# Patient Record
Sex: Female | Born: 1940 | Race: Black or African American | Hispanic: No | Smoking: Former smoker
Health system: Southern US, Community
[De-identification: ages and names within clinical notes are randomized; demographics above are authoritative.]

## PROBLEM LIST (undated history)

## (undated) DIAGNOSIS — D649 Anemia, unspecified: Secondary | ICD-10-CM

## (undated) DIAGNOSIS — K5790 Diverticulosis of intestine, part unspecified, without perforation or abscess without bleeding: Secondary | ICD-10-CM

## (undated) DIAGNOSIS — N200 Calculus of kidney: Secondary | ICD-10-CM

## (undated) DIAGNOSIS — Z9889 Other specified postprocedural states: Secondary | ICD-10-CM

## (undated) DIAGNOSIS — I10 Essential (primary) hypertension: Secondary | ICD-10-CM

## (undated) DIAGNOSIS — K219 Gastro-esophageal reflux disease without esophagitis: Secondary | ICD-10-CM

## (undated) DIAGNOSIS — E119 Type 2 diabetes mellitus without complications: Secondary | ICD-10-CM

## (undated) HISTORY — DX: Essential (primary) hypertension: I10

## (undated) HISTORY — DX: Calculus of kidney: N20.0

## (undated) HISTORY — DX: Type 2 diabetes mellitus without complications: E11.9

## (undated) HISTORY — DX: Gastro-esophageal reflux disease without esophagitis: K21.9

## (undated) HISTORY — DX: Anemia, unspecified: D64.9

## (undated) HISTORY — DX: Diverticulosis of intestine, part unspecified, without perforation or abscess without bleeding: K57.90

## (undated) HISTORY — DX: Other specified postprocedural states: Z98.890

## (undated) HISTORY — PX: ABDOMINAL HYSTERECTOMY: SHX81

## (undated) HISTORY — PX: EYE SURGERY: SHX253

## (undated) HISTORY — PX: OTHER SURGICAL HISTORY: SHX169

---

## 1999-07-11 ENCOUNTER — Encounter: Payer: Self-pay | Admitting: Family Medicine

## 1999-07-11 ENCOUNTER — Ambulatory Visit (HOSPITAL_COMMUNITY): Admission: RE | Admit: 1999-07-11 | Discharge: 1999-07-11 | Payer: Self-pay | Admitting: Family Medicine

## 1999-07-21 ENCOUNTER — Encounter: Payer: Self-pay | Admitting: Urology

## 1999-07-21 ENCOUNTER — Encounter: Admission: RE | Admit: 1999-07-21 | Discharge: 1999-07-21 | Payer: Self-pay | Admitting: Urology

## 1999-08-19 ENCOUNTER — Encounter: Admission: RE | Admit: 1999-08-19 | Discharge: 1999-08-19 | Payer: Self-pay | Admitting: Family Medicine

## 1999-08-19 ENCOUNTER — Encounter: Payer: Self-pay | Admitting: Family Medicine

## 2000-07-14 ENCOUNTER — Encounter: Payer: Self-pay | Admitting: Family Medicine

## 2000-07-14 ENCOUNTER — Ambulatory Visit (HOSPITAL_COMMUNITY): Admission: RE | Admit: 2000-07-14 | Discharge: 2000-07-14 | Payer: Self-pay | Admitting: Family Medicine

## 2002-04-11 ENCOUNTER — Encounter: Admission: RE | Admit: 2002-04-11 | Discharge: 2002-04-11 | Payer: Self-pay | Admitting: Family Medicine

## 2002-04-11 ENCOUNTER — Encounter: Payer: Self-pay | Admitting: Family Medicine

## 2003-01-27 DIAGNOSIS — E119 Type 2 diabetes mellitus without complications: Secondary | ICD-10-CM

## 2003-01-27 HISTORY — DX: Type 2 diabetes mellitus without complications: E11.9

## 2003-04-24 ENCOUNTER — Inpatient Hospital Stay (HOSPITAL_COMMUNITY): Admission: EM | Admit: 2003-04-24 | Discharge: 2003-04-27 | Payer: Self-pay | Admitting: Emergency Medicine

## 2004-02-28 ENCOUNTER — Ambulatory Visit: Payer: Self-pay | Admitting: Family Medicine

## 2004-04-17 LAB — HM MAMMOGRAPHY

## 2004-07-25 ENCOUNTER — Ambulatory Visit: Payer: Self-pay | Admitting: Family Medicine

## 2004-08-01 ENCOUNTER — Ambulatory Visit: Payer: Self-pay | Admitting: Family Medicine

## 2004-09-11 ENCOUNTER — Ambulatory Visit: Payer: Self-pay | Admitting: Family Medicine

## 2004-12-11 ENCOUNTER — Ambulatory Visit: Payer: Self-pay | Admitting: Family Medicine

## 2005-07-21 ENCOUNTER — Ambulatory Visit: Payer: Self-pay | Admitting: Family Medicine

## 2005-07-28 ENCOUNTER — Ambulatory Visit: Payer: Self-pay | Admitting: Family Medicine

## 2005-08-06 ENCOUNTER — Ambulatory Visit: Payer: Self-pay | Admitting: Family Medicine

## 2005-08-28 ENCOUNTER — Ambulatory Visit: Payer: Self-pay | Admitting: Family Medicine

## 2006-06-18 ENCOUNTER — Ambulatory Visit: Payer: Self-pay | Admitting: Family Medicine

## 2006-06-18 LAB — CONVERTED CEMR LAB
ALT: 16 units/L (ref 0–40)
AST: 19 units/L (ref 0–37)
Albumin: 4 g/dL (ref 3.5–5.2)
Alkaline Phosphatase: 77 units/L (ref 39–117)
BUN: 10 mg/dL (ref 6–23)
Basophils Absolute: 0 10*3/uL (ref 0.0–0.1)
Basophils Relative: 0.3 % (ref 0.0–1.0)
Bilirubin, Direct: 0.1 mg/dL (ref 0.0–0.3)
CO2: 30 meq/L (ref 19–32)
Calcium: 9.7 mg/dL (ref 8.4–10.5)
Chloride: 107 meq/L (ref 96–112)
Cholesterol: 139 mg/dL (ref 0–200)
Creatinine, Ser: 0.6 mg/dL (ref 0.4–1.2)
Creatinine,U: 92.3 mg/dL
Eosinophils Absolute: 0.1 10*3/uL (ref 0.0–0.6)
Eosinophils Relative: 1.8 % (ref 0.0–5.0)
GFR calc Af Amer: 129 mL/min
GFR calc non Af Amer: 107 mL/min
Glucose, Bld: 121 mg/dL — ABNORMAL HIGH (ref 70–99)
HCT: 38.5 % (ref 36.0–46.0)
HDL: 39.9 mg/dL (ref >39.0)
Hemoglobin: 13.2 g/dL (ref 12.0–15.0)
Hgb A1c MFr Bld: 6.7 % — ABNORMAL HIGH (ref 4.6–6.0)
LDL Cholesterol: 75 mg/dL (ref 0–99)
Lymphocytes Relative: 26.9 % (ref 12.0–46.0)
MCHC: 34.2 g/dL (ref 30.0–36.0)
MCV: 80.8 fL (ref 78.0–100.0)
Microalb Creat Ratio: 9.8 mg/g (ref 0.0–30.0)
Microalb, Ur: 0.9 mg/dL (ref 0.0–1.9)
Monocytes Absolute: 0.3 10*3/uL (ref 0.2–0.7)
Monocytes Relative: 5.1 % (ref 3.0–11.0)
Neutro Abs: 4.4 10*3/uL (ref 1.4–7.7)
Neutrophils Relative %: 65.9 % (ref 43.0–77.0)
Platelets: 261 10*3/uL (ref 150–400)
Potassium: 4.4 meq/L (ref 3.5–5.1)
RBC: 4.76 M/uL (ref 3.87–5.11)
RDW: 13.7 % (ref 11.5–14.6)
Sodium: 143 meq/L (ref 135–145)
TSH: 1.24 microintl units/mL (ref 0.35–5.50)
Total Bilirubin: 0.7 mg/dL (ref 0.3–1.2)
Total CHOL/HDL Ratio: 3.5
Total Protein: 7.8 g/dL (ref 6.0–8.3)
Triglycerides: 122 mg/dL (ref 0–149)
VLDL: 24 mg/dL (ref 0–40)
WBC: 6.6 10*3/uL (ref 4.5–10.5)

## 2006-07-27 ENCOUNTER — Ambulatory Visit: Payer: Self-pay | Admitting: Family Medicine

## 2006-08-09 ENCOUNTER — Encounter: Payer: Self-pay | Admitting: Family Medicine

## 2006-08-09 DIAGNOSIS — K219 Gastro-esophageal reflux disease without esophagitis: Secondary | ICD-10-CM | POA: Insufficient documentation

## 2006-08-09 DIAGNOSIS — I10 Essential (primary) hypertension: Secondary | ICD-10-CM | POA: Insufficient documentation

## 2006-09-09 ENCOUNTER — Ambulatory Visit: Payer: Self-pay | Admitting: Family Medicine

## 2007-03-11 ENCOUNTER — Telehealth: Payer: Self-pay | Admitting: Family Medicine

## 2007-03-14 ENCOUNTER — Ambulatory Visit: Payer: Self-pay | Admitting: Family Medicine

## 2007-03-14 LAB — CONVERTED CEMR LAB
ALT: 16 units/L (ref 0–35)
AST: 19 units/L (ref 0–37)
Albumin: 3.8 g/dL (ref 3.5–5.2)
Alkaline Phosphatase: 79 units/L (ref 39–117)
BUN: 9 mg/dL (ref 6–23)
Basophils Absolute: 0 10*3/uL (ref 0.0–0.1)
Basophils Relative: 0.1 % (ref 0.0–1.0)
Bilirubin Urine: NEGATIVE
Bilirubin, Direct: 0.1 mg/dL (ref 0.0–0.3)
Blood in Urine, dipstick: NEGATIVE
CO2: 29 meq/L (ref 19–32)
Calcium: 9.9 mg/dL (ref 8.4–10.5)
Chloride: 105 meq/L (ref 96–112)
Cholesterol: 120 mg/dL (ref 0–200)
Creatinine, Ser: 0.6 mg/dL (ref 0.4–1.2)
Creatinine,U: 126.2 mg/dL
Eosinophils Absolute: 0.2 10*3/uL (ref 0.0–0.6)
Eosinophils Relative: 2.5 % (ref 0.0–5.0)
GFR calc Af Amer: 129 mL/min
GFR calc non Af Amer: 107 mL/min
Glucose, Bld: 142 mg/dL — ABNORMAL HIGH (ref 70–99)
Glucose, Urine, Semiquant: NEGATIVE
HCT: 40.2 % (ref 36.0–46.0)
HDL: 31.7 mg/dL — ABNORMAL LOW (ref >39.0)
Hemoglobin: 13 g/dL (ref 12.0–15.0)
Hgb A1c MFr Bld: 7.1 % — ABNORMAL HIGH (ref 4.6–6.0)
Ketones, urine, test strip: NEGATIVE
LDL Cholesterol: 69 mg/dL (ref 0–99)
Lymphocytes Relative: 25 % (ref 12.0–46.0)
MCHC: 32.3 g/dL (ref 30.0–36.0)
MCV: 82.8 fL (ref 78.0–100.0)
Microalb Creat Ratio: 11.9 mg/g (ref 0.0–30.0)
Microalb, Ur: 1.5 mg/dL (ref 0.0–1.9)
Monocytes Absolute: 0.3 10*3/uL (ref 0.2–0.7)
Monocytes Relative: 4.6 % (ref 3.0–11.0)
Neutro Abs: 4.5 10*3/uL (ref 1.4–7.7)
Neutrophils Relative %: 67.8 % (ref 43.0–77.0)
Nitrite: NEGATIVE
Platelets: 232 10*3/uL (ref 150–400)
Potassium: 4.6 meq/L (ref 3.5–5.1)
Protein, U semiquant: NEGATIVE
RBC: 4.86 M/uL (ref 3.87–5.11)
RDW: 13.4 % (ref 11.5–14.6)
Rapid Strep: NEGATIVE
Sodium: 141 meq/L (ref 135–145)
Specific Gravity, Urine: 1.02
TSH: 0.95 microintl units/mL (ref 0.35–5.50)
Total Bilirubin: 0.6 mg/dL (ref 0.3–1.2)
Total CHOL/HDL Ratio: 3.8
Total Protein: 7.2 g/dL (ref 6.0–8.3)
Triglycerides: 99 mg/dL (ref 0–149)
Urobilinogen, UA: 0.2
VLDL: 20 mg/dL (ref 0–40)
WBC Urine, dipstick: NEGATIVE
WBC: 6.7 10*3/uL (ref 4.5–10.5)
pH: 7

## 2007-06-16 ENCOUNTER — Encounter: Payer: Self-pay | Admitting: Family Medicine

## 2007-06-16 ENCOUNTER — Ambulatory Visit: Payer: Self-pay | Admitting: Family Medicine

## 2007-06-16 ENCOUNTER — Other Ambulatory Visit: Admission: RE | Admit: 2007-06-16 | Discharge: 2007-06-16 | Payer: Self-pay | Admitting: Family Medicine

## 2007-06-22 ENCOUNTER — Telehealth: Payer: Self-pay | Admitting: Family Medicine

## 2007-07-07 ENCOUNTER — Telehealth: Payer: Self-pay | Admitting: *Deleted

## 2007-09-06 ENCOUNTER — Telehealth: Payer: Self-pay | Admitting: Family Medicine

## 2007-09-23 ENCOUNTER — Ambulatory Visit: Payer: Self-pay | Admitting: Family Medicine

## 2007-09-27 LAB — CONVERTED CEMR LAB
BUN: 11 mg/dL (ref 6–23)
CO2: 28 meq/L (ref 19–32)
Calcium: 9.3 mg/dL (ref 8.4–10.5)
Chloride: 108 meq/L (ref 96–112)
Creatinine, Ser: 0.6 mg/dL (ref 0.4–1.2)
GFR calc Af Amer: 129 mL/min
GFR calc non Af Amer: 106 mL/min
Glucose, Bld: 146 mg/dL — ABNORMAL HIGH (ref 70–99)
Hgb A1c MFr Bld: 7.3 % — ABNORMAL HIGH (ref 4.6–6.0)
Potassium: 4.3 meq/L (ref 3.5–5.1)
Sodium: 142 meq/L (ref 135–145)

## 2007-12-26 ENCOUNTER — Ambulatory Visit: Payer: Self-pay | Admitting: Family Medicine

## 2008-02-14 ENCOUNTER — Ambulatory Visit: Payer: Self-pay | Admitting: Family Medicine

## 2008-02-15 ENCOUNTER — Telehealth (INDEPENDENT_AMBULATORY_CARE_PROVIDER_SITE_OTHER): Payer: Self-pay | Admitting: *Deleted

## 2008-02-17 ENCOUNTER — Ambulatory Visit: Payer: Self-pay | Admitting: Gastroenterology

## 2008-02-20 ENCOUNTER — Encounter: Payer: Self-pay | Admitting: Gastroenterology

## 2008-03-08 ENCOUNTER — Telehealth: Payer: Self-pay | Admitting: Gastroenterology

## 2008-03-13 ENCOUNTER — Ambulatory Visit: Payer: Self-pay | Admitting: Gastroenterology

## 2008-03-14 ENCOUNTER — Ambulatory Visit: Payer: Self-pay | Admitting: Gastroenterology

## 2008-03-14 ENCOUNTER — Encounter: Payer: Self-pay | Admitting: Gastroenterology

## 2008-03-14 LAB — HM COLONOSCOPY

## 2008-03-15 ENCOUNTER — Encounter: Payer: Self-pay | Admitting: Gastroenterology

## 2008-03-21 ENCOUNTER — Telehealth: Payer: Self-pay | Admitting: Family Medicine

## 2008-03-28 ENCOUNTER — Emergency Department (HOSPITAL_COMMUNITY): Admission: EM | Admit: 2008-03-28 | Discharge: 2008-03-28 | Payer: Self-pay | Admitting: Emergency Medicine

## 2008-03-28 ENCOUNTER — Ambulatory Visit: Payer: Self-pay | Admitting: Family Medicine

## 2008-03-29 ENCOUNTER — Encounter: Payer: Self-pay | Admitting: Family Medicine

## 2008-03-29 LAB — CONVERTED CEMR LAB
ALT: 23 units/L (ref 0–35)
Alkaline Phosphatase: 104 units/L (ref 39–117)
Basophils Absolute: 0 10*3/uL (ref 0.0–0.1)
Bilirubin, Direct: 0.1 mg/dL (ref 0.0–0.3)
CO2: 30 meq/L (ref 19–32)
GFR calc Af Amer: 107 mL/min
Glucose, Bld: 550 mg/dL (ref 70–99)
Lymphocytes Relative: 23.1 % (ref 12.0–46.0)
MCHC: 34 g/dL (ref 30.0–36.0)
Monocytes Relative: 3.6 % (ref 3.0–12.0)
Platelets: 252 10*3/uL (ref 150–400)
Potassium: 4.3 meq/L (ref 3.5–5.1)
RDW: 13.6 % (ref 11.5–14.6)
Sodium: 138 meq/L (ref 135–145)
TSH: 0.79 microintl units/mL (ref 0.35–5.50)
Total Bilirubin: 1 mg/dL (ref 0.3–1.2)
Total Protein: 7.9 g/dL (ref 6.0–8.3)

## 2008-03-30 ENCOUNTER — Telehealth: Payer: Self-pay | Admitting: Family Medicine

## 2008-04-12 ENCOUNTER — Ambulatory Visit: Payer: Self-pay | Admitting: Family Medicine

## 2008-05-01 ENCOUNTER — Telehealth: Payer: Self-pay | Admitting: Family Medicine

## 2008-10-09 ENCOUNTER — Encounter: Payer: Self-pay | Admitting: Family Medicine

## 2008-11-06 ENCOUNTER — Telehealth: Payer: Self-pay | Admitting: Family Medicine

## 2008-11-30 ENCOUNTER — Ambulatory Visit: Payer: Self-pay | Admitting: Family Medicine

## 2008-12-04 LAB — CONVERTED CEMR LAB
BUN: 13 mg/dL (ref 6–23)
Calcium: 9.3 mg/dL (ref 8.4–10.5)
Cholesterol: 119 mg/dL (ref 0–200)
Creatinine, Ser: 0.7 mg/dL (ref 0.4–1.2)
GFR calc non Af Amer: 107.11 mL/min (ref >60)
Glucose, Bld: 119 mg/dL — ABNORMAL HIGH (ref 70–99)
HDL: 38.6 mg/dL — ABNORMAL LOW (ref >39.00)
Hgb A1c MFr Bld: 6.7 % — ABNORMAL HIGH (ref 4.6–6.5)
LDL Cholesterol: 62 mg/dL (ref 0–99)
Microalb Creat Ratio: 7.5 mg/g (ref 0.0–30.0)
Potassium: 4.4 meq/L (ref 3.5–5.1)
VLDL: 18.6 mg/dL (ref 0.0–40.0)

## 2009-04-02 ENCOUNTER — Telehealth: Payer: Self-pay | Admitting: Family Medicine

## 2009-10-03 ENCOUNTER — Telehealth: Payer: Self-pay | Admitting: Family Medicine

## 2009-11-01 ENCOUNTER — Telehealth: Payer: Self-pay | Admitting: Family Medicine

## 2009-11-19 ENCOUNTER — Telehealth: Payer: Self-pay | Admitting: Family Medicine

## 2009-12-03 ENCOUNTER — Telehealth: Payer: Self-pay | Admitting: Family Medicine

## 2009-12-06 ENCOUNTER — Ambulatory Visit: Payer: Self-pay | Admitting: Family Medicine

## 2009-12-06 LAB — CONVERTED CEMR LAB
AST: 20 units/L (ref 0–37)
Alkaline Phosphatase: 87 units/L (ref 39–117)
Basophils Absolute: 0 10*3/uL (ref 0.0–0.1)
Basophils Relative: 0.2 % (ref 0.0–3.0)
Bilirubin, Direct: 0.1 mg/dL (ref 0.0–0.3)
Blood in Urine, dipstick: NEGATIVE
CO2: 30 meq/L (ref 19–32)
Calcium: 9.4 mg/dL (ref 8.4–10.5)
Creatinine, Ser: 0.5 mg/dL (ref 0.4–1.2)
Creatinine,U: 121.8 mg/dL
Eosinophils Absolute: 0.1 10*3/uL (ref 0.0–0.7)
GFR calc non Af Amer: 150.48 mL/min (ref >60)
HDL: 40.9 mg/dL (ref >39.00)
Hemoglobin: 12.4 g/dL (ref 12.0–15.0)
Ketones, urine, test strip: NEGATIVE
LDL Cholesterol: 65 mg/dL (ref 0–99)
Lymphocytes Relative: 23.4 % (ref 12.0–46.0)
MCHC: 33.4 g/dL (ref 30.0–36.0)
Microalb, Ur: 3.2 mg/dL — ABNORMAL HIGH (ref 0.0–1.9)
Monocytes Relative: 4.3 % (ref 3.0–12.0)
Neutrophils Relative %: 71.2 % (ref 43.0–77.0)
Nitrite: NEGATIVE
RBC: 4.42 M/uL (ref 3.87–5.11)
Sodium: 139 meq/L (ref 135–145)
Specific Gravity, Urine: 1.025
Total CHOL/HDL Ratio: 3
Total Protein: 7.2 g/dL (ref 6.0–8.3)
Triglycerides: 99 mg/dL (ref 0.0–149.0)
Urobilinogen, UA: 0.2
VLDL: 19.8 mg/dL (ref 0.0–40.0)
WBC Urine, dipstick: NEGATIVE

## 2009-12-13 ENCOUNTER — Ambulatory Visit: Payer: Self-pay | Admitting: Family Medicine

## 2009-12-13 ENCOUNTER — Encounter: Payer: Self-pay | Admitting: Family Medicine

## 2009-12-25 ENCOUNTER — Ambulatory Visit: Payer: Self-pay | Admitting: Family Medicine

## 2010-02-25 NOTE — Progress Notes (Signed)
Summary: appointment  Phone Note Call from Patient   Caller: Patient Call For: Roderick Pee MD Details for Reason: appointment Summary of Call: Ms. Piche would like to come in and see Dr Tawanna Cooler Thursday or Friday.   phone number [phone] or [phone]. Initial call taken by: Kern Reap CMA Duncan Dull),  November 19, 2009 1:32 PM  Follow-up for Phone Call        Contacted pt and appt was scheduled for cpx labwork on  11/11  and  a cpx with Dr Tawanna Cooler on  11/18.  Follow-up by: Debbra Riding,  November 19, 2009 1:57 PM

## 2010-02-25 NOTE — Progress Notes (Signed)
Summary: take Zyrtec with Diabetic meds?  Phone Note Call from Patient Call back at Work Phone [phone]   Caller: Patient Call For: Roderick Pee MD Summary of Call: Pt has nagging cough and wants to know if she can take Zyrtec with diabetic meds???  [facility])  Initial call taken by: Lynann Beaver CMA,  November 01, 2009 1:17 PM  Follow-up for Phone Call        yes Zyrtec is okay  Follow-up by: Nelwyn Salisbury MD,  November 01, 2009 2:28 PM  Additional Follow-up for Phone Call Additional follow up Details #1::        Pt notified. Additional Follow-up by: Lynann Beaver CMA,  November 01, 2009 2:30 PM

## 2010-02-25 NOTE — Progress Notes (Signed)
Summary: refill request  Phone Note Refill Request Message from:  Fax from Pharmacy on December 03, 2009 11:01 AM  Refills Requested: Medication #1:  ZESTRIL 20 MG TABS 1 tablet by mouth two times a day  Medication #2:  GLUCOTROL 10 MG TABS take one tab two times a day Initial call taken by: Kern Reap CMA Duncan Dull),  December 03, 2009 11:01 AM    Prescriptions: GLUCOTROL 10 MG TABS (GLIPIZIDE) take one tab two times a day  #180 x 3   Entered by:   Kern Reap CMA (AAMA)   Authorized by:   Roderick Pee MD   Signed by:   Kern Reap CMA (AAMA) on 12/03/2009   Method used:   Electronically to        [facility] (retail)       [address]       [place], [place]  [address]       Ph: [phone]       Fax: [phone]   RxID:   [id number] ZESTRIL 20 MG TABS (LISINOPRIL) 1 tablet by mouth two times a day  #100 Each x 3   Entered by:   Kern Reap CMA (AAMA)   Authorized by:   Roderick Pee MD   Signed by:   Kern Reap CMA (AAMA) on 12/03/2009   Method used:   Electronically to        [facility] (retail)       [address]       [place], [place]  [address]       Ph: [phone]       Fax: [phone]   RxID:   [id number]

## 2010-02-25 NOTE — Assessment & Plan Note (Signed)
Summary: 2 wk rov//slm   Vital Signs:  Patient profile:   70 year old female Menstrual status:  hysterectomy Weight:      148 pounds Temp:     98.6 degrees F BP sitting:   130 / 88  (left arm) Cuff size:   regular  Vitals Entered By: Kern Reap CMA Duncan Dull) (December 25, 2009 11:33 AM) CC: follow-up visit   Primary Care Provider:  Kelle Darting, MD  CC:  follow-up visit.  History of Present Illness: Katrina Garrison is a 70 y/o fem with type ! DM who comes in today for reevaluation of diabetes, having increased her insulin to 15 units two weeks ago.  Her blood sugars are still elevated, although the general trend is coming down.  She is now getting numbers in the 130 to 150 range.  Asymptomatic except for an allergic reaction to peanut, chest and swelling of her eyes and her lips and some generalized pruritus.  No airway edema  Allergies: 1)  ! * Hydromet 2)  Sulfamethoxazole (Sulfamethoxazole)  Past History:  Past medical, surgical, family and social histories (including risk factors) reviewed for relevance to current acute and chronic problems.  Past Medical History: Reviewed history from 02/17/2008 and no changes required. Diabetes mellitus, type II GERD Hypertension lithotripsy x 3, for kidney stones childbirth x 2  Past Surgical History: Reviewed history from 02/17/2008 and no changes required. Hysterectomy CBX2  Family History: Reviewed history from 02/17/2008 and no changes required. Father died around age 43, alcoholism, and hypertension Mother died at age 94 throat cancer.  Smoker, diabetes, and hypertension Both daughters have Multiple Sclerosis.   Social History: Reviewed history from 06/16/2007 and no changes required. Occupation: Single Never Smoked Alcohol use-no Drug use-no Regular exercise-yes  Review of Systems      See HPI  Physical Exam  General:  Well-developed,well-nourished,in no acute distress; alert,appropriate and cooperative throughout  examination   Impression & Recommendations:  Problem # 1:  IDDM (ICD-250.01) Assessment Improved  Her updated medication list for this problem includes:    Glucophage 1000 Mg Tabs (Metformin hcl) .Marland Kitchen... Take 1 tablet by mouth two times a day    Aspirin 81 Mg Tbec (Aspirin) ..... Once daily    Glucotrol 10 Mg Tabs (Glipizide) .Marland Kitchen... Take one tab two times a day    Humalog Mix 75/25 75-25 % Susp (Insulin lispro prot & lispro) ..... Use 15 units at bedtime    Lisinopril 40 Mg Tabs (Lisinopril) .Marland Kitchen... Take 1 tablet by mouth every morning  Complete Medication List: 1)  Glucophage 1000 Mg Tabs (Metformin hcl) .... Take 1 tablet by mouth two times a day 2)  Aspirin 81 Mg Tbec (Aspirin) .... Once daily 3)  Vitamin B Complex Caps (B complex vitamins) .... Once daily 4)  Vitamin D3 10000 Unit Caps (Cholecalciferol) .... Once daily 5)  Motrin Ib 200 Mg Tabs (Ibuprofen) .... As needed 6)  Onetouch Ultra Test Strp (Glucose blood) .... Test daily as directed by your doctor 7)  Onetouch Ultrasoft Lancets Misc (Lancets) .... Test daily as directed by your doctor 8)  Glucotrol 10 Mg Tabs (Glipizide) .... Take one tab two times a day 9)  Monoject Insulin Syringe 29g X 1/2" 0.5 Ml Misc (Insulin syringe-needle u-100) .... Uad 10)  Humalog Mix 75/25 75-25 % Susp (Insulin lispro prot & lispro) .... Use 15 units at bedtime 11)  Lisinopril 40 Mg Tabs (Lisinopril) .... Take 1 tablet by mouth every morning 12)  Allegra Allergy 180 Mg  Tabs (Fexofenadine hcl) .... Once daily  Patient Instructions: 1)  increase the insulin to 20 units daily.  Return in two weeks for follow-up.  Check a fasting blood sugar daily only once daily in the morning. 2)  Take 10 mg of plain Claritin daily to help with reverse the allergic reaction   Orders Added: 1)  Est. Patient Level III [84696]

## 2010-02-25 NOTE — Assessment & Plan Note (Signed)
Summary: CPX // RS   Vital Signs:  Patient profile:   70 year old female Menstrual status:  hysterectomy Height:      61 inches Weight:      147 pounds BMI:     27.88 Temp:     98.8 degrees F oral BP sitting:   140 / 84  (left arm) Cuff size:   regular  Vitals Entered By: Kern Reap CMA Duncan Dull) (December 13, 2009 2:19 PM) CC: wellness exam     Menstrual Status hysterectomy   Primary Care Provider:  Kelle Darting, MD  CC:  wellness exam.  History of Present Illness: Katrina Garrison is a 70 year old female type I diabetic who comes in today for annual wellness exam.  She currently takes metformin 1000 mg b.i.d., Glucotrol, 10 mg b.i.d., Humalog mixed 75 -- 25 dose 10 units nightly.  Blood sugar fasting 196.  Hemoglobin A1c 9.4%.  We discussed serious options.  We will begin to increase her insulin until he can get her blood sugars are normal.  Also, recommend she see the diabetic teaching nurse.  She takes lisinopril 40 mg daily and 81-mg baby aspirin daily.  BP 140/84.  She gets routine eye care, dental care, BSE monthly, annual mammography, colonoscopy, normal, tetanus, 2005, seasonal flu 2011, Pneumovax 2005, information given on shingles Here for Medicare AWV:  1.   Risk factors based on Past M, S, F history:..reviewed no changes except for uncontrolled blood sugar 2.   Physical Activities:  walks daily and her work 3.   Depression/mood: good mood.  No depression 4.   Hearing: normal 5.   ADL's: functions independently works daily 6.   Fall Risk: reviewed.  None identified 7.   Home Safety: no guns in the house 8.   Height, weight, &visual acuity:height weight, normal vision, unchanged.  Eye exam shows no retinopathy 9.   Counseling: increase your insulin by 5 units weekly until the blood sugar dropped to normal 10.   Labs ordered based on risk factors: reviewed all the data 11.           Referral Coordination........none indicated 12.           Care Plan........Marland Kitchenreviewed all  medications  13.            Cognitive Assessment   Allergies: 1)  ! * Hydromet 2)  Sulfamethoxazole (Sulfamethoxazole)  Past History:  Past medical, surgical, family and social histories (including risk factors) reviewed, and no changes noted (except as noted below).  Past Medical History: Reviewed history from 02/17/2008 and no changes required. Diabetes mellitus, type II GERD Hypertension lithotripsy x 3, for kidney stones childbirth x 2  Past Surgical History: Reviewed history from 02/17/2008 and no changes required. Hysterectomy CBX2  Family History: Reviewed history from 02/17/2008 and no changes required. Father died around age 35, alcoholism, and hypertension Mother died at age 26 throat cancer.  Smoker, diabetes, and hypertension Both daughters have Multiple Sclerosis.   Social History: Reviewed history from 06/16/2007 and no changes required. Occupation: Single Never Smoked Alcohol use-no Drug use-no Regular exercise-yes  Review of Systems      See HPI  Physical Exam  General:  Well-developed,well-nourished,in no acute distress; alert,appropriate and cooperative throughout examination Head:  Normocephalic and atraumatic without obvious abnormalities. No apparent alopecia or balding. Eyes:  No corneal or conjunctival inflammation noted. EOMI. Perrla. Funduscopic exam benign, without hemorrhages, exudates or papilledema. Vision grossly normal. Ears:  External ear exam shows no significant lesions or deformities.  Otoscopic examination reveals clear canals, tympanic membranes are intact bilaterally without bulging, retraction, inflammation or discharge. Hearing is grossly normal bilaterally. Nose:  External nasal examination shows no deformity or inflammation. Nasal mucosa are pink and moist without lesions or exudates. Mouth:  Oral mucosa and oropharynx without lesions or exudates.  Teeth in good repair. Neck:  No deformities, masses, or tenderness  noted. Chest Wall:  No deformities, masses, or tenderness noted. Breasts:  No mass, nodules, thickening, tenderness, bulging, retraction, inflamation, nipple discharge or skin changes noted.   Lungs:  Normal respiratory effort, chest expands symmetrically. Lungs are clear to auscultation, no crackles or wheezes. Heart:  Normal rate and regular rhythm. S1 and S2 normal without gallop, murmur, click, rub or other extra sounds. Abdomen:  Bowel sounds positive,abdomen soft and non-tender without masses, organomegaly or hernias noted. Rectal:  No external abnormalities noted. Normal sphincter tone. No rectal masses or tenderness. Genitalia:  Pelvic Exam:        External: normal female genitalia without lesions or masses        Vagina: normal without lesions or masses        Cervix: normal without lesions or masses        Adnexa: normal bimanual exam without masses or fullness        Uterus: normal by palpation        Pap smear: not performed Msk:  No deformity or scoliosis noted of thoracic or lumbar spine.   Pulses:  R and L carotid,radial,femoral,dorsalis pedis and posterior tibial pulses are full and equal bilaterally Extremities:  No clubbing, cyanosis, edema, or deformity noted with normal full range of motion of all joints.   Neurologic:  No cranial nerve deficits noted. Station and gait are normal. Plantar reflexes are down-going bilaterally. DTRs are symmetrical throughout. Sensory, motor and coordinative functions appear intact. Skin:  Intact without suspicious lesions or rashes Cervical Nodes:  No lymphadenopathy noted Axillary Nodes:  No palpable lymphadenopathy Inguinal Nodes:  No significant adenopathy Psych:  Cognition and judgment appear intact. Alert and cooperative with normal attention span and concentration. No apparent delusions, illusions, hallucinations  Diabetes Management Exam:    Foot Exam (with socks and/or shoes not present):       Sensory-Pinprick/Light touch:           Left medial foot (L-4): normal          Left dorsal foot (L-5): normal          Left lateral foot (S-1): normal          Right medial foot (L-4): normal          Right dorsal foot (L-5): normal          Right lateral foot (S-1): normal       Sensory-Monofilament:          Left foot: normal          Right foot: normal       Inspection:          Left foot: normal          Right foot: normal       Nails:          Left foot: normal          Right foot: normal    Eye Exam:       Eye Exam done elsewhere          Date: 11/06/2008  Results: normal          Done by: opth   Impression & Recommendations:  Problem # 1:  IDDM (ICD-250.01) Assessment Deteriorated  The following medications were removed from the medication list:    Zestril 20 Mg Tabs (Lisinopril) .Marland Kitchen... 1 tablet by mouth two times a day Her updated medication list for this problem includes:    Glucophage 1000 Mg Tabs (Metformin hcl) .Marland Kitchen... Take 1 tablet by mouth two times a day    Aspirin 81 Mg Tbec (Aspirin) ..... Once daily    Glucotrol 10 Mg Tabs (Glipizide) .Marland Kitchen... Take one tab two times a day    Humalog Mix 75/25 75-25 % Susp (Insulin lispro prot & lispro) ..... Use 15 units at bedtime    Lisinopril 40 Mg Tabs (Lisinopril) .Marland Kitchen... Take 1 tablet by mouth every morning  Orders: Prescription Created Electronically [phone]) Medicare -1st Annual Wellness Visit [phone])  Problem # 2:  ACUTE LARYNGOTRACHEITIS W/O MENTION OBSTRUCTION (ICD-464.20) Assessment: Improved  Problem # 3:  Preventive Health Care (ICD-V70.0) Assessment: Unchanged  Complete Medication List: 1)  Glucophage 1000 Mg Tabs (Metformin hcl) .... Take 1 tablet by mouth two times a day 2)  Aspirin 81 Mg Tbec (Aspirin) .... Once daily 3)  Vitamin B Complex Caps (B complex vitamins) .... Once daily 4)  Vitamin D3 10000 Unit Caps (Cholecalciferol) .... Once daily 5)  Motrin Ib 200 Mg Tabs (Ibuprofen) .... As needed 6)  Onetouch Ultra Test Strp (Glucose  blood) .... Test daily as directed by your doctor 7)  Onetouch Ultrasoft Lancets Misc (Lancets) .... Test daily as directed by your doctor 8)  Glucotrol 10 Mg Tabs (Glipizide) .... Take one tab two times a day 9)  Monoject Insulin Syringe 29g X 1/2" 0.5 Ml Misc (Insulin syringe-needle u-100) .... Uad 10)  Humalog Mix 75/25 75-25 % Susp (Insulin lispro prot & lispro) .... Use 15 units at bedtime 11)  Lisinopril 40 Mg Tabs (Lisinopril) .... Take 1 tablet by mouth every morning  Other Orders: EKG w/ Interpretation (93000)  Patient Instructions: 1)  increase the insulin by 5 units weekly until your fasting blood sugar drops to 100...........Marland Kitchen 15 units starting tonight 2)  Please schedule a follow-up appointment in 2 weeks. 3)  It is important that you exercise regularly at least 20 minutes 5 times a week. If you develop chest pain, have severe difficulty breathing, or feel very tired , stop exercising immediately and seek medical attention. 4)  Schedule your mammogram. 5)  Schedule a colonoscopy/sigmoidoscopy to help detect colon cancer. 6)  Take calcium +Vitamin D daily. 7)  Take an Aspirin every day. 8)  Check your blood sugars regularly. If your readings are usually above : or below 70 you should contact our office. 9)  It is important that your Diabetic A1c level is checked every 3 months. 10)  See your eye doctor yearly to check for diabetic eye damage. 11)  Check your feet each night for sore areas, calluses or signs of infection. 12)  Check your Blood Pressure regularly. If it is above: you should make an appointment. Prescriptions: HUMALOG MIX 75/25 75-25 % SUSP (INSULIN LISPRO PROT & LISPRO) use 15 units at bedtime  #2 vials x 6   Entered and Authorized by:   Roderick Pee MD   Signed by:   Roderick Pee MD on 12/13/2009   Method used:   Electronically to        [facility] (retail)  [address]       [place], [place]  [address]       Ph:  [phone]       Fax: [phone]   RxID:   [id number] LISINOPRIL 40 MG TABS (LISINOPRIL) Take 1 tablet by mouth every morning  #100 x 3   Entered and Authorized by:   Roderick Pee MD   Signed by:   Roderick Pee MD on 12/13/2009   Method used:   Electronically to        [facility] (retail)       [address]       [place], [place]  [address]       Ph: [phone]       Fax: [phone]   RxID:   [id number] MONOJECT INSULIN SYRINGE 29G X 1/2" 0.5 ML MISC (INSULIN SYRINGE-NEEDLE U-100) UAD  #100 x 3   Entered and Authorized by:   Roderick Pee MD   Signed by:   Roderick Pee MD on 12/13/2009   Method used:   Electronically to        [facility] (retail)       [address]       [place], [place]  [address]       Ph: [phone]       Fax: [phone]   RxID:   [id number] GLUCOTROL 10 MG TABS (GLIPIZIDE) take one tab two times a day  #200 x 3   Entered and Authorized by:   Roderick Pee MD   Signed by:   Roderick Pee MD on 12/13/2009   Method used:   Electronically to        [facility] (retail)       [address]       [place], [place]  [address]       Ph: [phone]       Fax: [phone]   RxID:   [id number] Biagio Borg LANCETS  MISC (LANCETS) test daily as directed by your doctor  #100 x 11   Entered and Authorized by:   Roderick Pee MD   Signed by:   Roderick Pee MD on 12/13/2009   Method used:   Electronically to        [facility] (retail)       [address]       [place], [place]  [address]       Ph: [phone]       Fax: [phone]   RxID:   [id number] [place] TEST  STRP (GLUCOSE BLOOD) test daily as directed by your doctor  #100 Each x 10   Entered and Authorized by:   Roderick Pee MD   Signed by:   Roderick Pee MD on 12/13/2009   Method used:   Electronically to         [facility] (retail)       [address]       [place], [place]  [address]       Ph: [phone]       Fax: [phone]   RxID:  [id number] GLUCOPHAGE 1000 MG TABS (METFORMIN HCL) Take 1 tablet by mouth two times a day  #200 x 3   Entered and Authorized by:   Roderick Pee MD   Signed by:   Roderick Pee MD on 12/13/2009   Method used:   Electronically to        [facility] (retail)       [address]       [place], [place]  [address]       Ph: [phone]       Fax: [phone]   RxID:   [id number]    Orders Added: 1)  Prescription Created Electronically [phone] 2)  Medicare -1st Annual Wellness Visit [G0438] 3)  EKG w/ Interpretation [93000]

## 2010-02-25 NOTE — Progress Notes (Signed)
Summary: refill  Phone Note Refill Request Message from:  Fax from Pharmacy on October 03, 2009 4:59 PM  Refills Requested: Medication #1:  HUMALOG MIX 75/25 75-25 % SUSP use 10 units at bedtime Initial call taken by: Kern Reap CMA Duncan Dull),  October 03, 2009 4:59 PM    Prescriptions: HUMALOG MIX 75/25 75-25 % SUSP (INSULIN LISPRO PROT & LISPRO) use 10 units at bedtime  #3 x 0   Entered by:   Kern Reap CMA (AAMA)   Authorized by:   Roderick Pee MD   Signed by:   Kern Reap CMA (AAMA) on 10/03/2009   Method used:   Electronically to        [facility] (retail)       [address]       [place], [place]  [address]       Ph: [phone]       Fax: [phone]   RxID:   [id number]

## 2010-02-25 NOTE — Progress Notes (Signed)
Summary: REFILL  Phone Note Refill Request Message from:  Fax from Pharmacy  Refills Requested: Medication #1:  GLUCOTROL 10 MG TABS take one tab two times a day [facility] [address] EAV----[phone]  Initial call taken by: Warnell Forester,  April 02, 2009 9:23 AM    Prescriptions: GLUCOTROL 10 MG TABS (GLIPIZIDE) take one tab two times a day  #60 x 8   Entered by:   Kern Reap CMA (AAMA)   Authorized by:   Roderick Pee MD   Signed by:   Kern Reap CMA (AAMA) on 04/02/2009   Method used:   Electronically to        [facility] (retail)       [address]       [place], [place]  [address]       Ph: [phone]       Fax: [phone]   RxID:   [id number]

## 2010-05-08 LAB — BLOOD GAS, VENOUS
Bicarbonate: 15.7 mEq/L — ABNORMAL LOW (ref 20.0–24.0)
FIO2: 0.21 %
Patient temperature: 98.6
pH, Ven: 7.351 — ABNORMAL HIGH (ref 7.250–7.300)
pO2, Ven: 36 mmHg (ref 30.0–45.0)

## 2010-05-08 LAB — COMPREHENSIVE METABOLIC PANEL
ALT: 29 U/L (ref 0–35)
AST: 24 U/L (ref 0–37)
Albumin: 4 g/dL (ref 3.5–5.2)
Alkaline Phosphatase: 107 U/L (ref 39–117)
BUN: 16 mg/dL (ref 6–23)
CO2: 27 mEq/L (ref 19–32)
Calcium: 10 mg/dL (ref 8.4–10.5)
Chloride: 98 mEq/L (ref 96–112)
Creatinine, Ser: 0.8 mg/dL (ref 0.4–1.2)
GFR calc Af Amer: >60 mL/min (ref >60)
GFR calc non Af Amer: >60 mL/min (ref >60)
Glucose, Bld: 512 mg/dL (ref 70–99)
Potassium: 4 mEq/L (ref 3.5–5.1)
Sodium: 134 mEq/L — ABNORMAL LOW (ref 135–145)
Total Bilirubin: 0.8 mg/dL (ref 0.3–1.2)
Total Protein: 7.6 g/dL (ref 6.0–8.3)

## 2010-05-08 LAB — DIFFERENTIAL
Basophils Absolute: 0 10*3/uL (ref 0.0–0.1)
Basophils Relative: 0 % (ref 0–1)
Eosinophils Absolute: 0.1 10*3/uL (ref 0.0–0.7)
Eosinophils Relative: 1 % (ref 0–5)
Lymphocytes Relative: 25 % (ref 12–46)
Lymphs Abs: 2 10*3/uL (ref 0.7–4.0)
Monocytes Absolute: 0.3 10*3/uL (ref 0.1–1.0)
Monocytes Relative: 4 % (ref 3–12)
Neutro Abs: 5.6 10*3/uL (ref 1.7–7.7)
Neutrophils Relative %: 70 % (ref 43–77)

## 2010-05-08 LAB — GLUCOSE, CAPILLARY: Glucose-Capillary: 540 mg/dL (ref 70–99)

## 2010-05-08 LAB — URINE MICROSCOPIC-ADD ON: Urine-Other: NONE SEEN

## 2010-05-08 LAB — URINALYSIS, ROUTINE W REFLEX MICROSCOPIC
Bilirubin Urine: NEGATIVE
Glucose, UA: >1000 mg/dL — AB
Hgb urine dipstick: NEGATIVE
Leukocytes, UA: NEGATIVE
Nitrite: NEGATIVE
Protein, ur: NEGATIVE mg/dL
Specific Gravity, Urine: 1.031 — ABNORMAL HIGH (ref 1.005–1.030)
Urobilinogen, UA: 0.2 mg/dL (ref 0.0–1.0)
pH: 7 (ref 5.0–8.0)

## 2010-05-08 LAB — CBC
HCT: 40.8 % (ref 36.0–46.0)
Hemoglobin: 13.6 g/dL (ref 12.0–15.0)
MCHC: 33.5 g/dL (ref 30.0–36.0)
MCV: 81.1 fL (ref 78.0–100.0)
Platelets: 264 10*3/uL (ref 150–400)
RBC: 5.03 MIL/uL (ref 3.87–5.11)
RDW: 14.5 % (ref 11.5–15.5)
WBC: 8 10*3/uL (ref 4.0–10.5)

## 2010-05-08 LAB — KETONES, QUALITATIVE: Acetone, Bld: NEGATIVE

## 2010-05-13 LAB — GLUCOSE, CAPILLARY: Glucose-Capillary: 290 mg/dL — ABNORMAL HIGH (ref 70–99)

## 2010-06-04 ENCOUNTER — Other Ambulatory Visit: Payer: Self-pay | Admitting: Family Medicine

## 2010-06-13 NOTE — Discharge Summary (Signed)
NAMECLAUDENE, Katrina Garrison                       ACCOUNT NO.:  [id number]   MEDICAL RECORD NO.:  [id number]                   PATIENT TYPE:  INP   LOCATION:  5524                                 FACILITY:  [facility]   PHYSICIAN:  Katrina Garrison, M.D. [facility]          DATE OF BIRTH:  05-10-1940   DATE OF ADMISSION:  04/24/2003  DATE OF DISCHARGE:  04/27/2003                                 DISCHARGE SUMMARY   DISCHARGE DIAGNOSES:  1. New diagnosis of diabetes.  2. Uncontrolled hyperglycemia.  3. Early diabetic ketoacidosis.  4. Acute renal insufficiency.  5. Hypernatremia.   HISTORY OF PRESENT ILLNESS:  Katrina Garrison is a 70 year old, African-American  female who presented with eight-day history of cough and fever.  She had  been seen by her primary M.D. and diagnosed with pneumonia.  She had been  given Tussionex, albuterol MDI, prednisone and doxycycline for outpatient  treatment.  The patient was not tolerating the medications well.  She was  developing nausea, vomiting and abdominal pain.  The patient's family was  concerned about dehydration and brought her to the emergency department.  It  was there where she was found to have a glucose greater than 600.  The  patient denied prior history of diabetes.  The patient did described  polyuria and polydipsia prior to admission.   PAST MEDICAL HISTORY:  Diverticular disease.   HOSPITAL COURSE:  Problem 1.  ADULT-ONSET DIABETES MELLITUS (NEW DIAGNOSIS):  The patient presented with uncontrolled diabetes.  Her hemoglobin A1C was  elevated at 12%.  The patient was started on the Glucommander insulin drip.  Once her blood sugars had improved, she was given a dose of Lantus and then  started on Glucophage.  The patient has now also been started on Amaryl  which improved with blood sugar control.  The patient's blood sugars are  ranging from the high 100s to the high 200s currently.  The patient has  begun diabetic education.  She has been  instructed to check her blood  sugars.  She does have a follow-up appointment scheduled at the outpatient  nutrition and diabetes management center.  We have also arranged closed  followup with her primary care physician for further titration of her  medications.   Problem 2.  EARLY DIABETIC KETOACIDOSIS:  The patient presented with mild  acidosis secondary to the hyperglycemia.  This did correct with the  normalization of her blood sugars and with fluids.   Problem 3.  ACUTE RENAL INSUFFICIENCY SECONDARY TO THE HYPEROSMOLAR STATE:  This did correct with fluids.   Problem 4.  HYPERNATREMIA SECONDARY TO DEHYDRATION FROM HER METABOLIC  ABNORMALITY:  This also corrected with fluids.   DISCHARGE LABORATORY DATA AND X-RAY FINDINGS:  Hemoglobin 13.4, hematocrit  40.  Sodium 145, potassium 3.7, creatinine 0.9.  Hemoglobin A1C was 12.1%.   DISCHARGE MEDICATIONS:  1. Glucophage 500 mg b.i.d.  2. Amaryl 2 mg b.i.d.   FOLLOW UP:  Follow up with Dr. Tawanna Garrison on Tuesday, April 5, at 11 a.m.      Katrina Garrison, P.A. [facility]                  Katrina Garrison, M.D. [facility]    LC/MEDQ  D:  04/27/2003  T:  04/27/2003  Job:  [id number]   cc:   Katrina Garrison, M.D. [facility] [place]

## 2010-08-12 ENCOUNTER — Telehealth: Payer: Self-pay | Admitting: Family Medicine

## 2010-08-12 NOTE — Telephone Encounter (Signed)
Pt called and has a question. Would not leave any details. Req call back from nurse.

## 2010-08-12 NOTE — Telephone Encounter (Signed)
Left message on machine returning patient's call 

## 2010-08-13 NOTE — Telephone Encounter (Signed)
Spoke with patient.

## 2010-08-20 ENCOUNTER — Other Ambulatory Visit: Payer: Self-pay | Admitting: *Deleted

## 2010-08-20 MED ORDER — LISINOPRIL 40 MG PO TABS: 40.0000 mg | ORAL_TABLET | Freq: Every day | ORAL | Status: DC

## 2010-12-04 ENCOUNTER — Other Ambulatory Visit (INDEPENDENT_AMBULATORY_CARE_PROVIDER_SITE_OTHER): Payer: Self-pay

## 2010-12-04 DIAGNOSIS — E119 Type 2 diabetes mellitus without complications: Secondary | ICD-10-CM

## 2010-12-04 LAB — BASIC METABOLIC PANEL
GFR: 104.75 mL/min (ref >60.00)
Glucose, Bld: 266 mg/dL — ABNORMAL HIGH (ref 70–99)
Potassium: 4.6 mEq/L (ref 3.5–5.1)
Sodium: 141 mEq/L (ref 135–145)

## 2010-12-04 LAB — HEMOGLOBIN A1C: Hgb A1c MFr Bld: 11.5 % — ABNORMAL HIGH (ref 4.6–6.5)

## 2010-12-11 ENCOUNTER — Other Ambulatory Visit: Payer: Self-pay

## 2010-12-15 ENCOUNTER — Encounter: Payer: Self-pay | Admitting: Family Medicine

## 2010-12-16 ENCOUNTER — Ambulatory Visit (INDEPENDENT_AMBULATORY_CARE_PROVIDER_SITE_OTHER): Payer: 59 | Admitting: Family Medicine

## 2010-12-16 ENCOUNTER — Encounter: Payer: Self-pay | Admitting: Family Medicine

## 2010-12-16 DIAGNOSIS — Z23 Encounter for immunization: Secondary | ICD-10-CM

## 2010-12-16 DIAGNOSIS — E109 Type 1 diabetes mellitus without complications: Secondary | ICD-10-CM

## 2010-12-16 NOTE — Progress Notes (Signed)
  Subjective:    Patient ID: Katrina Garrison, female    DOB: Dec 29, 1940, 70 y.o.   MRN: [id number]  HPI [name] is a delightful, 70 year old female, nonsmoker, who comes in today for follow-up of diabetes, type I.  She stopped her metformin 1000 mg b.i.d. Because of GI side effects.  She is currently taking glipizide 10 mg b.i.d. And insulin 7525 does 20 units daily.  Fasting blood sugar in the 260 range.  Hemoglobin A1c11 percent   Review of Systems General and metabolic review of systems otherwise negative    Objective:   Physical Exam  Well-developed thin, female, in no acute distress      Assessment & Plan:  Diabetes type 1, and not at goal.  Plan diet and exercise, increase insulin to 30 units daily continue glipizide 10 b.i.d. Fasting blood sugar daily.  Follow-up in 3 days

## 2010-12-16 NOTE — Progress Notes (Signed)
Addended by: Kern Reap B on: 12/16/2010 05:30 PM   Modules accepted: Orders

## 2010-12-16 NOTE — Patient Instructions (Signed)
Increase the insulin to 30 units daily at bedtime.  Continue the glipizide, 10 mg twice daily.  Check a fasting blood sugar daily in the morning.  Return to p.m., Friday to check your blood sugar readings and meter with Fleet Contras.  Return to see me in two weeks for follow-up.  In one week.  If your blood sugar is not normal.  Increased her insulin to 40 units daily

## 2010-12-17 ENCOUNTER — Ambulatory Visit: Payer: Self-pay | Admitting: Family Medicine

## 2011-02-23 ENCOUNTER — Telehealth: Payer: Self-pay | Admitting: *Deleted

## 2011-02-23 NOTE — Telephone Encounter (Addendum)
Pt would like Fleet Contras to call her.  Only needs to speak to [facility], please.

## 2011-02-26 NOTE — Telephone Encounter (Signed)
Spoke to [employer]

## 2011-03-12 LAB — HM DIABETES EYE EXAM: HM Diabetic Eye Exam: NORMAL

## 2011-03-20 ENCOUNTER — Encounter: Payer: Self-pay | Admitting: Family Medicine

## 2011-04-03 ENCOUNTER — Other Ambulatory Visit: Payer: Self-pay | Admitting: *Deleted

## 2011-04-03 MED ORDER — GLIPIZIDE 10 MG PO TABS: 10.0000 mg | ORAL_TABLET | Freq: Two times a day (BID) | ORAL | Status: DC

## 2011-04-03 MED ORDER — LISINOPRIL 40 MG PO TABS: 40.0000 mg | ORAL_TABLET | Freq: Every day | ORAL | Status: DC

## 2011-06-19 ENCOUNTER — Other Ambulatory Visit: Payer: Self-pay | Admitting: *Deleted

## 2011-06-19 MED ORDER — INSULIN LISPRO PROT & LISPRO (75-25 MIX) 100 UNIT/ML ~~LOC~~ SUSP: 20.0000 [IU] | Freq: Every day | SUBCUTANEOUS | Status: DC

## 2011-06-19 MED ORDER — "INSULIN SYRINGE 29G X 1/2"" 0.5 ML MISC": 1.0000 | Status: DC

## 2011-07-21 ENCOUNTER — Telehealth: Payer: Self-pay | Admitting: Family Medicine

## 2011-07-21 NOTE — Telephone Encounter (Signed)
CAN accidentally closed it.

## 2011-07-21 NOTE — Telephone Encounter (Signed)
Caller: Henriette/Patient; Phone Number: [phone]; Message from caller: Please have Fleet Contras call her as soon as she gets this message

## 2011-07-21 NOTE — Telephone Encounter (Signed)
Spoke with patient and an appointment made 

## 2011-07-29 ENCOUNTER — Ambulatory Visit (INDEPENDENT_AMBULATORY_CARE_PROVIDER_SITE_OTHER): Payer: 59 | Admitting: Family Medicine

## 2011-07-29 ENCOUNTER — Encounter: Payer: Self-pay | Admitting: Family Medicine

## 2011-07-29 VITALS — BP 170/80 | HR 80 | Temp 99.3°F | Wt 141.0 lb

## 2011-07-29 DIAGNOSIS — E119 Type 2 diabetes mellitus without complications: Secondary | ICD-10-CM

## 2011-07-29 DIAGNOSIS — I10 Essential (primary) hypertension: Secondary | ICD-10-CM

## 2011-07-29 MED ORDER — AMLODIPINE BESYLATE 5 MG PO TABS: 5.0000 mg | ORAL_TABLET | Freq: Every day | ORAL | Status: DC

## 2011-07-29 NOTE — Progress Notes (Signed)
  Subjective:    Patient ID: Katrina Garrison, female    DOB: [birth year], 71 y.o.   MRN: [id number]  HPI  Acute visit. Patient has history of type 2 diabetes and hypertension. Last A1c 11.5% back in November. Takes glipizide 10 mg twice daily and 75/25 insulin 30 units at night. Last week noted blood sugars up in the 200s to 319 range. Normally low 100s. Urine frequency and thirst. Should be on metformin 1000 mg twice a day but currently not taking because of diarrhea. In the past, has taken one half tablet with good tolerance.  Hypertension currently lisinopril 40 mg daily compliant with therapy. No headaches or dizziness. Blood pressures consistently over 150 systolic recently.  Past Medical History  Diagnosis Date  . Diabetes mellitus type II   . GERD (gastroesophageal reflux disease)   . Hypertension   . History of lithotripsy     x3 for kidney stones   Past Surgical History  Procedure Date  . Abdominal hysterectomy   . Childbirth x 2     reports that she quit smoking about 26 years ago. She does not have any smokeless tobacco history on file. Her alcohol and drug histories not on file. family history includes Alcohol abuse in her father; Cancer in her mother; Diabetes in her mother; Hypertension in her father and mother; and Multiple sclerosis in her sisters. Allergies  Allergen Reactions  . Hydrocodone-Homatropine     REACTION: hallucinations  . Sulfamethoxazole     REACTION: "comatose"      Review of Systems  Constitutional: Negative for fatigue.  Eyes: Negative for visual disturbance.  Respiratory: Negative for cough, chest tightness, shortness of breath and wheezing.   Cardiovascular: Negative for chest pain, palpitations and leg swelling.  Neurological: Negative for dizziness, seizures, syncope, weakness, light-headedness and headaches.       Objective:   Physical Exam  Constitutional: She is oriented to person, place, and time. She appears well-developed and  well-nourished.  Neck: Neck supple. No thyromegaly present.       No carotid bruits  Cardiovascular: Normal rate and regular rhythm.   Pulmonary/Chest: Effort normal and breath sounds normal. No respiratory distress. She has no wheezes. She has no rales.  Musculoskeletal: She exhibits no edema.  Neurological: She is alert and oriented to person, place, and time.          Assessment & Plan:  #1 type 2 diabetes. Poor control. Try adding back metformin 1000 mg one half tablet twice daily. Followup with primary physician in 2 weeks to reassess.  May need BID insulin soon. #2 hypertension. Poorly controlled, especially systolic. Add amlodipine 5 mg once daily. Continue lisinopril. Reassess blood pressure 2 weeks

## 2011-07-29 NOTE — Patient Instructions (Addendum)
Get back on metformin 1000 mg one half tablet twice daily

## 2011-08-11 ENCOUNTER — Ambulatory Visit: Payer: 59 | Admitting: Family Medicine

## 2011-08-13 ENCOUNTER — Other Ambulatory Visit: Payer: Self-pay | Admitting: *Deleted

## 2011-08-13 MED ORDER — METFORMIN HCL 1000 MG PO TABS: 1000.0000 mg | ORAL_TABLET | Freq: Two times a day (BID) | ORAL | Status: DC

## 2011-08-13 MED ORDER — INSULIN LISPRO PROT & LISPRO (75-25 MIX) 100 UNIT/ML ~~LOC~~ SUSP: 30.0000 [IU] | Freq: Every day | SUBCUTANEOUS | Status: DC

## 2011-09-30 ENCOUNTER — Telehealth: Payer: Self-pay | Admitting: Family Medicine

## 2011-09-30 MED ORDER — INSULIN LISPRO PROT & LISPRO (75-25 MIX) 100 UNIT/ML ~~LOC~~ SUSP: 30.0000 [IU] | Freq: Every day | SUBCUTANEOUS | Status: DC

## 2011-09-30 NOTE — Telephone Encounter (Signed)
Pt called and is req the Pen form for  Humalog 75/25. Pt req to pick up this wk. [facility] on [address]. Pt req that Fleet Contras call her.

## 2011-10-28 ENCOUNTER — Other Ambulatory Visit: Payer: Self-pay | Admitting: *Deleted

## 2011-10-28 MED ORDER — INSULIN LISPRO PROT & LISPRO (75-25 MIX) 100 UNIT/ML ~~LOC~~ SUSP: 30.0000 [IU] | Freq: Every day | SUBCUTANEOUS | Status: DC

## 2011-10-30 ENCOUNTER — Ambulatory Visit: Payer: 59

## 2011-11-05 ENCOUNTER — Ambulatory Visit: Payer: 59 | Admitting: Family Medicine

## 2011-11-11 ENCOUNTER — Ambulatory Visit (INDEPENDENT_AMBULATORY_CARE_PROVIDER_SITE_OTHER): Payer: 59

## 2011-11-11 DIAGNOSIS — Z23 Encounter for immunization: Secondary | ICD-10-CM

## 2012-02-25 ENCOUNTER — Telehealth: Payer: Self-pay | Admitting: Family Medicine

## 2012-02-25 NOTE — Telephone Encounter (Signed)
Pt would like you to call her at her office.

## 2012-02-26 NOTE — Telephone Encounter (Signed)
Spoke with patient.

## 2012-03-17 ENCOUNTER — Encounter: Payer: Self-pay | Admitting: Family Medicine

## 2012-03-17 ENCOUNTER — Ambulatory Visit (INDEPENDENT_AMBULATORY_CARE_PROVIDER_SITE_OTHER): Payer: 59 | Admitting: Family Medicine

## 2012-03-17 VITALS — BP 130/80 | Temp 98.7°F | Wt 138.0 lb

## 2012-03-17 DIAGNOSIS — I10 Essential (primary) hypertension: Secondary | ICD-10-CM

## 2012-03-17 DIAGNOSIS — J45909 Unspecified asthma, uncomplicated: Secondary | ICD-10-CM | POA: Insufficient documentation

## 2012-03-17 DIAGNOSIS — E109 Type 1 diabetes mellitus without complications: Secondary | ICD-10-CM

## 2012-03-17 LAB — LIPID PANEL
Cholesterol: 127 mg/dL (ref 0–200)
LDL Cholesterol: 71 mg/dL (ref 0–99)
Total CHOL/HDL Ratio: 3
VLDL: 17.2 mg/dL (ref 0.0–40.0)

## 2012-03-17 LAB — HEPATIC FUNCTION PANEL
Bilirubin, Direct: 0.1 mg/dL (ref 0.0–0.3)
Total Bilirubin: 0.9 mg/dL (ref 0.3–1.2)
Total Protein: 7.5 g/dL (ref 6.0–8.3)

## 2012-03-17 LAB — CBC WITH DIFFERENTIAL/PLATELET
Basophils Relative: 0.1 % (ref 0.0–3.0)
Eosinophils Absolute: 0.1 10*3/uL (ref 0.0–0.7)
HCT: 38.9 % (ref 36.0–46.0)
Hemoglobin: 12.8 g/dL (ref 12.0–15.0)
Lymphocytes Relative: 35.9 % (ref 12.0–46.0)
MCHC: 32.8 g/dL (ref 30.0–36.0)
Neutro Abs: 4.5 10*3/uL (ref 1.4–7.7)
RBC: 4.75 Mil/uL (ref 3.87–5.11)

## 2012-03-17 LAB — MICROALBUMIN / CREATININE URINE RATIO
Creatinine,U: 268.9 mg/dL
Microalb, Ur: 4.2 mg/dL — ABNORMAL HIGH (ref 0.0–1.9)

## 2012-03-17 LAB — BASIC METABOLIC PANEL
CO2: 26 mEq/L (ref 19–32)
Calcium: 9.2 mg/dL (ref 8.4–10.5)
Glucose, Bld: 206 mg/dL — ABNORMAL HIGH (ref 70–99)
Potassium: 3.6 mEq/L (ref 3.5–5.1)
Sodium: 138 mEq/L (ref 135–145)

## 2012-03-17 MED ORDER — HYDROCODONE-HOMATROPINE 5-1.5 MG/5ML PO SYRP: ORAL_SOLUTION | ORAL | Status: DC

## 2012-03-17 MED ORDER — PREDNISONE 20 MG PO TABS: ORAL_TABLET | ORAL | Status: DC

## 2012-03-17 MED ORDER — ALBUTEROL SULFATE HFA 108 (90 BASE) MCG/ACT IN AERS: 2.0000 | INHALATION_SPRAY | Freq: Four times a day (QID) | RESPIRATORY_TRACT | Status: DC | PRN

## 2012-03-17 NOTE — Progress Notes (Signed)
  Subjective:    Patient ID: Katrina Garrison, female    DOB: [birth year], 72 y.o.   MRN: [id number]  HPI Katrina Garrison is a 72 year old female nonsmoker who comes in today for evaluation of asthma  On February 9 she developed a cough 5 days ago she began wheezing. No fever earache sore throat etc. 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 Last flareup of asthma was about 10 years ago.  She's a type I diabetic she checks her blood sugar 3 times weekly blood sugars averaging about 1:30.   Review of Systems Review of systems otherwise negative    Objective:   Physical Exam  Well-developed well-nourished female no acute distress HEENT negative neck was supple no adenopathy lungs show symmetrical breath sounds inspiratory and expiratory moderate wheezing  Respiratory rate 12 and unlabored      Assessment & Plan:  Asthma plan begin prednisone monitor blood sugar carefully followup in 5 days

## 2012-03-17 NOTE — Patient Instructions (Addendum)
Drink lots of water  Vaporizer  Prednisone,,,,,,,,,,, 2 tabs daily  Hydromet,,,,,,, 1/2-1 teaspoon 3 times daily. For cough  Albuterol,,,,,,,,, 2 puffs 4 times daily  Return on Monday for followup  A rest at home and do not work

## 2012-03-21 ENCOUNTER — Ambulatory Visit (INDEPENDENT_AMBULATORY_CARE_PROVIDER_SITE_OTHER): Payer: 59 | Admitting: Family Medicine

## 2012-03-21 ENCOUNTER — Encounter: Payer: Self-pay | Admitting: Family Medicine

## 2012-03-21 VITALS — BP 140/90 | Temp 99.3°F

## 2012-03-21 DIAGNOSIS — E109 Type 1 diabetes mellitus without complications: Secondary | ICD-10-CM

## 2012-03-21 DIAGNOSIS — J45909 Unspecified asthma, uncomplicated: Secondary | ICD-10-CM

## 2012-03-21 NOTE — Patient Instructions (Addendum)
Beginning tomorrow take a half of prednisone for 4 days,,,,,,, skip Saturday and Sunday,,,,,,,, then take a half of prednisone Monday Wednesday Friday for a two-week taper  Increase your insulin to 50 units daily   Check a fasting blood sugar daily in the morning and return in 6 weeks for followup  Tighten up on your diabetic diet and walk 30 minutes daily

## 2012-03-21 NOTE — Progress Notes (Signed)
  Subjective:    Patient ID: Katrina Garrison, female    DOB: 1940/09/27, 72 y.o.   MRN: [id number]  HPI Katrina Garrison is a 72 year old female who comes in today for followup of asthma  We saw her last Thursday with acute asthma and started on prednisone 40 mg daily for 3 days and then begin to taper. She took one prednisone daily. Her blood sugars jumped up in the 240 range. She takes Humalog 75-25 doses 4 units daily. She states she feels a whole better wheezing is basically gone   Review of Systems    review of systems otherwise negative except for elevated blood sugar Objective:   Physical Exam Well-developed well-nourished female no acute distress lungs are clear no wheezing       Assessment & Plan:  Asthma resolving plan taper prednisone increase insulin followup in one week

## 2012-03-21 NOTE — Addendum Note (Signed)
Addended by: Kern Reap B on: 03/21/2012 02:56 PM   Modules accepted: Orders

## 2012-03-31 ENCOUNTER — Telehealth: Payer: Self-pay | Admitting: Family Medicine

## 2012-03-31 NOTE — Telephone Encounter (Signed)
Caller: Etana/Patient; Phone: [phone]; Reason for Call: She said she had missed a call from office not sure what it was about.  I said I saw they were working on a referral to endocrinology She said she did cancel her appt on Monday because she couldn't get off work but she will call them and reschedule.

## 2012-04-05 ENCOUNTER — Ambulatory Visit: Payer: 59 | Admitting: Internal Medicine

## 2012-04-11 ENCOUNTER — Other Ambulatory Visit: Payer: 59

## 2012-04-18 ENCOUNTER — Encounter: Payer: 59 | Admitting: Family Medicine

## 2012-04-25 ENCOUNTER — Telehealth: Payer: Self-pay | Admitting: Family Medicine

## 2012-04-25 NOTE — Telephone Encounter (Signed)
Caller: Nancyann/Patient; Phone: [phone]; Reason for Call: Fleet Contras, Dr.  Nelida Meuse nurse please call Chastidy at work.  [phone].  Needs to ask question in reference to a problem she is having.

## 2012-04-25 NOTE — Telephone Encounter (Signed)
Spoke with patient.  Endo appointment made.

## 2012-05-17 ENCOUNTER — Encounter: Payer: Self-pay | Admitting: Family Medicine

## 2012-05-24 ENCOUNTER — Other Ambulatory Visit: Payer: Self-pay | Admitting: Family Medicine

## 2012-06-06 ENCOUNTER — Encounter: Payer: Self-pay | Admitting: Internal Medicine

## 2012-06-06 ENCOUNTER — Ambulatory Visit (INDEPENDENT_AMBULATORY_CARE_PROVIDER_SITE_OTHER): Payer: 59 | Admitting: Internal Medicine

## 2012-06-06 VITALS — BP 134/72 | HR 96 | Temp 98.1°F | Resp 12 | Ht 62.0 in | Wt 141.0 lb

## 2012-06-06 DIAGNOSIS — E109 Type 1 diabetes mellitus without complications: Secondary | ICD-10-CM

## 2012-06-06 MED ORDER — INSULIN DETEMIR 100 UNIT/ML FLEXPEN: 40.0000 [IU] | PEN_INJECTOR | Freq: Every day | SUBCUTANEOUS | Status: DC

## 2012-06-06 MED ORDER — SITAGLIPTIN PHOSPHATE 100 MG PO TABS: 100.0000 mg | ORAL_TABLET | Freq: Every day | ORAL | Status: DC

## 2012-06-06 NOTE — Progress Notes (Signed)
Patient ID: Dawnette Mione, female   DOB: 05/18/1940, 72 y.o.   MRN: [id number]  HPI: Ziyan Hillmer is a 72 y.o.-year-old female, referred by her PCP, Dr.Todd, for management of DM2, insulin-dependent, uncontrolled, without complications, but with hospitalization for diabetic coma x 2 (last was 3 years ago).  Patient has been diagnosed with diabetes ~ 5 years ago; she started insulin 1-2 years ago. Last hemoglobin A1c was: Lab Results  Component Value Date   HGBA1C 12.9* 03/17/2012    Pt is on a regimen of: - Humalog 75/25 pen - 50 units daily once a day - forgets the insulin 1/7 days. She tells me that she is taking the insulin at bedtime! She is injecting the bedtime insulin in her abdomen. No problems with the injection sites. She sometimes developed shaking and sweating after taking the nighttime insulin. She also can inject approximately 5 units in the morning in her upper arm to helped her lower her sugars during the day.  She was also on Glipizide 10 bid, now off. She was on Metformin 1000 mg po bid, but not anymore 2/2 diarrhea which is incapacitating, and is impeding her work  Pt checks her sugars once a day and they are: - am: fluctuating from 96-250, with one value at 364 after forgetting her insulin the night before No lows. Lowest sugar was 96; she has hypoglycemia awareness at 60.   Pt's meals are: - Breakfast: grits  - Lunch: chicken - no skin or pork tenderloin, pasta, salad+ dressing - Dinner: (5:30 and 6) - goes to bed at 7-7:30 - dinner is largest meal: salads, pasta, potatoes/string bean/peas/squash - SNACKS: Peanut bars She exercises by walking 5 days a week.  Pt does not have chronic kidney disease, last BUN/creatinine was:  Lab Results  Component Value Date   BUN 9 03/17/2012   CREATININE 0.7 03/17/2012  ACR 1.6 3 mo ago.  Last set of lipids: Lab Results  Component Value Date   CHOL 127 03/17/2012   HDL 38.80* 03/17/2012   LDLCALC 71 03/17/2012   TRIG 86.0  03/17/2012   CHOLHDL 3 03/17/2012   Pt's last eye exam was last year, next eye exam 06/04 with Dr. Elmer Picker. No DR. Denies numbness and tingling in her legs - Dr. Angelia Mould podiatry.  I reviewed her chart and she also has a history of asthma, and is on frequent prednisone tapers, last around 02/2012; also HTN;  h/o nephrolithiais, s/p lithotripsy; GERD.  Pt has FH of DM in mother.  ROS: Constitutional: + increased appetite, no weight gain/loss, no fatigue, + hot flashes Eyes: no blurry vision, no xerophthalmia ENT: +sore throat, no nodules palpated in throat, no dysphagia/odynophagia, + hoarseness, + tinnitus Cardiovascular: no CP/SOB/palpitations/leg swelling Respiratory: + cough/no SOB/ + wheezing Gastrointestinal: no N/V/D/C Musculoskeletal: no muscle/joint aches Skin: no rashes, + itching Neurological: no tremors/numbness/tingling/dizziness, + headaches Psychiatric: no depression/anxiety Low libido  Past Medical History  Diagnosis Date  . Diabetes mellitus type II   . GERD (gastroesophageal reflux disease)   . Hypertension   . History of lithotripsy     x3 for kidney stones    Past Surgical History  Procedure Laterality Date  . Abdominal hysterectomy    . Childbirth x 2     History   Social History  . Marital Status: Divorced    Spouse Name: N/A    Number of Children: 2   Occupational History  . [employer] impression specialist [employer]   Social History Main Topics  .  Smoking status: Former Smoker -- 1.5 years    Quit date: 01/28/1985  . Smokeless tobacco: No  . Alcohol Use: Wine once a month, one drink  . Drug Use: No   Current Outpatient Prescriptions on File Prior to Visit  Medication Sig Dispense Refill  . albuterol (PROVENTIL HFA;VENTOLIN HFA) 108 (90 BASE) MCG/ACT inhaler Inhale 2 puffs into the lungs every 6 (six) hours as needed for wheezing.  1 Inhaler  1  . amLODipine (NORVASC) 5 MG tablet Take 1 tablet (5 mg total) by mouth daily.  30 tablet  5   . b complex vitamins tablet Take 1 tablet by mouth daily.        . Cholecalciferol (VITAMIN D3) 10000 UNITS capsule Take 10,000 Units by mouth daily.        . fexofenadine (ALLEGRA) 180 MG tablet Take 180 mg by mouth daily.        Marland Kitchen glucose blood (ONE TOUCH TEST STRIPS) test strip 1 each by Other route as needed. Use as instructed       . ibuprofen (ADVIL,MOTRIN) 200 MG tablet Take 200 mg by mouth every 6 (six) hours as needed.        . INSULIN SYRINGE .5CC/29G (MONOJECT INS SYR .5CC/29G) 29G X 1/2" 0.5 ML MISC 1 each by Does not apply route as directed.  100 each  3  . lisinopril (PRINIVIL,ZESTRIL) 20 MG tablet TAKE TWO TABLETS BY MOUTH EVERY DAY  180 tablet  3  . ONE TOUCH LANCETS MISC by Does not apply route daily.        Marland Kitchen aspirin 81 MG tablet Take 81 mg by mouth daily.         No current facility-administered medications on file prior to visit.   Allergies  Allergen Reactions  . Metformin And Related Diarrhea  . Sulfamethoxazole     REACTION: "comatose"   Family History  Problem Relation Age of Onset  . Cancer Mother     throat  . Diabetes Mother   . Hypertension Mother   . Alcohol abuse Father   . Hypertension Father   . Multiple sclerosis Sister   . Multiple sclerosis Sister    PE: BP 134/72  Pulse 96  Temp(Src) 98.1 F (36.7 C) (Oral)  Resp 12  Ht 5\' 2"  (1.575 m)  Wt 141 lb (63.957 kg)  BMI 25.78 kg/m2  SpO2 96% Wt Readings from Last 3 Encounters:  06/06/12 141 lb (63.957 kg)  03/17/12 138 lb (62.596 kg)  07/29/11 141 lb (63.957 kg)   Constitutional: normal weight, in NAD, looking younger than stated age Eyes: PERRLA, EOMI, no exophthalmos ENT: moist mucous membranes, no thyromegaly, no cervical lymphadenopathy Cardiovascular: RRR, No MRG Respiratory: CTA B Gastrointestinal: abdomen soft, NT, ND, BS+ Musculoskeletal: no deformities, strength intact in all 4 Skin: moist, warm, no rashes Neurological: no tremor with outstretched hands, DTR normal in all  4  ASSESSMENT: 1. DM2, insulin-dependent, uncontrolled, without complications  PLAN:  1. Patient with diabetes in the last approximately 5 years, on a regimen on an insulin 75/25, which however, she is taken at bedtime, putting her at risk for hypoglycemia due to the short-acting insulin. She does have fluctuating blood sugars in the morning, but the higher sugars in the 300s being when she forgets to take the insulin the night before. - pt appears to be very conscientious about her diabetes, she implemented healthy changes in her diet, and mentions that her sugars improved since her last hemoglobin A1c  was checked in 02/2012.  - We discussed about her regimen, and I suggested that she start long-acting insulin (we'll try Levemir since this is better covered by [place] [employer]). We'll start at Levemir 40 units every night. - patient cannot take metformin due to severe diarrhea, so I will take it off her list - she is not taking glipizide - To cover her mealtime sugars, will start Januvia 100 mg in a.m. I'm not sure whether she can be managed only on Januvia for mealtime coverage, but I would like to give it a try, especially since she is telling me that her sugars improved after her last hemoglobin A1c checked. For now, I do not have sugars later in the day, to get a better idea about her mealtime CBG excursions. - given sugar log and advised how to fill it and to bring it at next appt - I advised her to check at least twice a day - given foot care handout and explained the principles - given instructions for hypoglycemia management "15-15 rule" - I will see the patient back in a month with her sugar log - I advised her to keep her 75/25 insulin-dependent, as he might need them if she would require rapid-acting insulin, although this is not a preferred regimen since the doses are not very flexible

## 2012-06-06 NOTE — Patient Instructions (Addendum)
Please stop the Humalog 75/25 insulin. Start Levemir 40 units at bedtime. Start Januvia 100 mg daily before breakfast. Stop Metformin. Please return in 1 month with your sugar log.   PATIENT INSTRUCTIONS FOR TYPE 2 DIABETES:  **Please join MyChart!** - see attached instructions about how to join   DIET AND EXERCISE Diet and exercise is an important part of diabetic treatment.  We recommended aerobic exercise in the form of brisk walking (working between 40-60% of maximal aerobic capacity, similar to brisk walking) for 150 minutes per week (such as 30 minutes five days per week) along with 3 times per week performing 'resistance' training (using various gauge rubber tubes with handles) 5-10 exercises involving the major muscle groups (upper body, lower body and core) performing 10-15 repetitions (or near fatigue) each exercise. Start at half the above goal but build slowly to reach the above goals. If limited by weight, joint pain, or disability, we recommend daily walking in a swimming pool with water up to waist to reduce pressure from joints while allow for adequate exercise.    BLOOD GLUCOSES Monitoring your blood glucoses is important for continued management of your diabetes. Please check your blood glucoses 2-4 times a day: fasting, before meals and at bedtime (you can rotate these measurements - e.g. one day check before the 3 meals, the next day check before 2 of the meals and before bedtime, etc.   HYPOGLYCEMIA (low blood sugar) Hypoglycemia is usually a reaction to not eating, exercising, or taking too much insulin/ other diabetes drugs.  Symptoms include tremors, sweating, hunger, confusion, headache, etc. Treat IMMEDIATELY with 15 grams of Carbs:   4 glucose tablets    cup regular juice/soda   2 tablespoons raisins   4 teaspoons sugar   1 tablespoon honey Recheck blood glucose in 15 mins and repeat above if still symptomatic/blood glucose <100. Please contact our office at  [phone] if you have questions about how to next handle your insulin.  RECOMMENDATIONS TO REDUCE YOUR RISK OF DIABETIC COMPLICATIONS: * Take your prescribed MEDICATION(S). * Follow a DIABETIC diet: Complex carbs, fiber rich foods, heart healthy fish twice weekly, (monounsaturated and polyunsaturated) fats * AVOID saturated/trans fats, high fat foods, >2,300 mg salt per day. * EXERCISE at least 5 times a week for 30 minutes or preferably daily.  * DO NOT SMOKE OR DRINK more than 1 drink a day. * Check your FEET every day. Do not wear tightfitting shoes. Contact us if you develop an ulcer * See your EYE doctor once a year or more if needed * Get a FLU shot once a year * Get a PNEUMONIA vaccine once before and once after age 63 years  GOALS:  * Your Hemoglobin A1c of <7%  * Your Systolic BP should be 140 or lower  * Your Diastolic BP should be 80 or lower  * Your HDL (Good Cholesterol) should be 40 or higher  * Your LDL (Bad Cholesterol) should be 100 or lower  * Your Triglycerides should be 150 or lower  * Your Urine microalbumin (kidney function) should be <30 * Your Body Mass Index should be 25 or lower   We will be glad to help you achieve these goals. Our telephone number is: [phone].

## 2012-06-14 ENCOUNTER — Encounter: Payer: 59 | Admitting: Family Medicine

## 2012-07-07 ENCOUNTER — Ambulatory Visit (INDEPENDENT_AMBULATORY_CARE_PROVIDER_SITE_OTHER): Payer: 59 | Admitting: Internal Medicine

## 2012-07-07 ENCOUNTER — Encounter: Payer: Self-pay | Admitting: Internal Medicine

## 2012-07-07 VITALS — BP 126/78 | HR 105 | Temp 98.9°F | Resp 10 | Ht 62.0 in | Wt 142.0 lb

## 2012-07-07 DIAGNOSIS — E109 Type 1 diabetes mellitus without complications: Secondary | ICD-10-CM

## 2012-07-07 MED ORDER — PEN NEEDLES 31G X 6 MM MISC: Status: DC

## 2012-07-07 NOTE — Patient Instructions (Addendum)
Please sop eating desert at night before bed. Please return in 1 month with your sugar log.

## 2012-07-07 NOTE — Progress Notes (Signed)
Patient ID: Katrina Garrison, female   DOB: 06-05-1940, 72 y.o.   MRN: [id number]  HPI: Katrina Garrison is a 72 y.o.-year-old female-year-old female, returning for f/u for DM2, dx 2009, insulin-dependent, uncontrolled, without complications, but with hospitalization for diabetic coma x 2 (last was 3 years ago). Last visit 71 mo ago.   Patient started insulin 71-2 years ago. Last hemoglobin A1c was: Lab Results  Component Value Date   HGBA1C 12.9* 03/17/2012   Patient is on the following regimen: - Levemir 40 units each bedtime, started last visit - Januvia 100 mg daily, started at last visit Pt was on a regimen of Humalog 75/25 pen - 50 units daily once a day - but was forgetting the insulin 1/7 days. She was taking the insulin at bedtime >> lows at night and highs, mostly in the 200s but also some in the 300s in am. She was also on Glipizide 10 bid in the past. She was on Metformin 1000 mg po bid, but not anymore 2/2 diarrhea which is incapacitating, and is impeding her work.  Pt checks her sugars once a day and they are: - am: fluctuating from 96-250 >> now 150-230 - she does not check sugars later usually, however, when she checks, she is in the upper 100s or even 200 -  No sugars checked at bedtime No lows; she has hypoglycemia awareness at 60.  We reviewed her log together, and she documents sporadically, also keeping a food diary. She tells me that every night before going to bed she has a pound cake slice and a icecream!  She exercises by walking 5 days a week.  Pt does not have chronic kidney disease. ACR 1.6 3 mo ago. Last LDL level checked 4 months ago was 71.  Pt's last eye exam was last week, with Dr. Elmer Picker. I received the report of this today. No DR, no macular edema. Denies numbness and tingling in her legs - Dr. Angelia Mould podiatry.  She also has a history of asthma, and is on frequent prednisone tapers, last around 02/2012; also HTN;  h/o nephrolithiais, s/p lithotripsy;  GERD.  ROS: Constitutional: + increased appetite, no weight gain/loss, no fatigue, + hot flashes Eyes: no blurry vision, no xerophthalmia ENT:  No sore throat, no nodules palpated in throat, no dysphagia/odynophagia Cardiovascular: no CP/SOB/palpitations/leg swelling Respiratory: no cough/no SOB/ wheezing Gastrointestinal: no N/V/D/C Musculoskeletal: no muscle/joint aches Skin: no rashes, + itching Neurological: no tremors/numbness/tingling/dizziness, + headaches Psychiatric: no depression/anxiety Low libido  PE: BP 126/78  Pulse 105  Temp(Src) 98.9 F (37.2 C) (Oral)  Resp 10  Ht 5\' 2"  (1.575 m)  Wt 142 lb (64.411 kg)  BMI 25.97 kg/m2  SpO2 95% Wt Readings from Last 3 Encounters:  07/07/12 142 lb (64.411 kg)  06/06/12 141 lb (63.957 kg)  03/17/12 138 lb (62.596 kg)   Constitutional: normal weight, in NAD, looking younger than stated age Eyes: PERRLA, EOMI, no exophthalmos ENT: moist mucous membranes, no thyromegaly, no cervical lymphadenopathy Cardiovascular: RRR, No MRG Respiratory: CTA B Gastrointestinal: abdomen soft, NT, ND, BS+ Musculoskeletal: no deformities, strength intact in all 4 Skin: moist, warm, no rashes Neurological: no tremor with outstretched hands, DTR normal in all 4  ASSESSMENT: 1. DM2, insulin-dependent, uncontrolled, without complications  PLAN:  1. Patient with diabetes in the last approximately 5 years, slightly improved recently after starting basal insulin with 40 units of Levemir qhs and adding Januvia 100 mg in the morning. - pt appears to be very conscientious about her diabetes,  she implemented healthy changes in her diet, and mentions that her sugars improved since her last hemoglobin A1c was checked in 02/2012.  - we discussed about her diet, and reviewed her log, and explained that it is not clear whether her high sugars in the mornings are caused by not enough Levemir, or lack of coverage of her dinner, especially since we do not have  sugars at bedtime. I suspect is the latter. She tells me that she actually has a slice of lemon pound cake and ice cream every night before going to bed. She knows that this might cause her sugars to increase in a.m., and decided to not do this from now on. She would like to stop this before changing anything in her regimen, and I agree. - I explained again how to fill out her sugar log and showed her an example -  continue Levemir 40 units every night. - patient cannot take metformin due to severe diarrhea, so would not retry it - we'll continue Januvia 100 mg in a.m. I'm not sure whether she can be managed only on Januvia for mealtime coverage; we need more sugars later in the day to appreciate whether Januvia is enough  - we'll check her hemoglobin A1c at next visit - I will see the patient back in a month with her sugar log

## 2012-08-04 ENCOUNTER — Ambulatory Visit: Payer: 59 | Admitting: Internal Medicine

## 2012-08-12 ENCOUNTER — Telehealth: Payer: Self-pay | Admitting: *Deleted

## 2012-08-12 DIAGNOSIS — E109 Type 1 diabetes mellitus without complications: Secondary | ICD-10-CM

## 2012-08-12 MED ORDER — SITAGLIPTIN PHOSPHATE 100 MG PO TABS: 100.0000 mg | ORAL_TABLET | Freq: Every day | ORAL | Status: DC

## 2012-08-12 NOTE — Telephone Encounter (Signed)
Rx refill

## 2012-08-31 ENCOUNTER — Other Ambulatory Visit: Payer: Self-pay

## 2012-09-29 ENCOUNTER — Telehealth: Payer: Self-pay | Admitting: Family Medicine

## 2012-09-29 ENCOUNTER — Other Ambulatory Visit: Payer: Self-pay | Admitting: Family Medicine

## 2012-09-29 DIAGNOSIS — E109 Type 1 diabetes mellitus without complications: Secondary | ICD-10-CM

## 2012-09-29 NOTE — Telephone Encounter (Signed)
Spoke with patient and lab appointment made. 

## 2012-09-29 NOTE — Telephone Encounter (Signed)
PT is requesting that you call her on her cell. She would disclose no further details. Please assist.

## 2012-10-14 ENCOUNTER — Other Ambulatory Visit (INDEPENDENT_AMBULATORY_CARE_PROVIDER_SITE_OTHER): Payer: 59

## 2012-10-14 DIAGNOSIS — E109 Type 1 diabetes mellitus without complications: Secondary | ICD-10-CM

## 2012-10-14 LAB — BASIC METABOLIC PANEL
BUN: 11 mg/dL (ref 6–23)
CO2: 28 mEq/L (ref 19–32)
Chloride: 107 mEq/L (ref 96–112)
Creatinine, Ser: 0.6 mg/dL (ref 0.4–1.2)
Glucose, Bld: 184 mg/dL — ABNORMAL HIGH (ref 70–99)

## 2012-10-20 ENCOUNTER — Encounter: Payer: Self-pay | Admitting: Internal Medicine

## 2012-10-20 ENCOUNTER — Ambulatory Visit (INDEPENDENT_AMBULATORY_CARE_PROVIDER_SITE_OTHER): Payer: 59 | Admitting: Internal Medicine

## 2012-10-20 VITALS — BP 138/78 | Temp 98.8°F | Resp 10 | Wt 133.6 lb

## 2012-10-20 DIAGNOSIS — E109 Type 1 diabetes mellitus without complications: Secondary | ICD-10-CM

## 2012-10-20 NOTE — Progress Notes (Addendum)
Patient ID: Katrina Garrison, female   DOB: April 18, 1940, 72 y.o.   MRN: [id number]  HPI: Katrina Garrison is a 72 y.o.-year-old female, returning for f/u for DM2, dx 2009, insulin-dependent since 2012-13, uncontrolled, without chronic complications, with hospitalization for diabetic coma x 2 (last was 3 years ago). Last visit 3 mo ago.   Last hemoglobin A1c was: Lab Results  Component Value Date   HGBA1C 12.6* 10/14/2012   Reviewed latest A1C values: Component     Latest Ref Rng 12/04/2010 03/17/2012 10/14/2012  Hemoglobin A1C     4.6 - 6.5 % 11.5 (H) 12.9 (H) 12.6 (H)   Patient is on the following regimen: - Levemir 40 units hs - Januvia 100 mg daily  Pt was on a regimen of Humalog 75/25 pen - 50 units daily once a day - but was forgetting the insulin 1/7 days. She was taking the insulin at bedtime >> lows at night and highs, mostly in the 200s but also some in the 300s in am. She is doing better on Levemir.  She was also on Glipizide 10 bid in the past. She was on Metformin 1000 mg po bid, but stopped 2/2 diarrhea which is incapacitating, and is impeding her work.  Pt checks her sugars 1-2x a day and they are: - am: fluctuating from 96-250 >> 150-230 >> 207-307 - 2 hours after breakfast: 356 - before lunch: upper 100s-up to 200 >> 204 - before dinner: upper 100s-up to 200 >> 234-341 - Bedtime >> not checking >> 223-404 No lows; she has hypoglycemia awareness at 60.  For his high sugars, she tried metformin one day, and the next morning sugar was 166.  Meals: - b'fast: egg, bacon, cinnamon toast, green tea no sugar, coffee - lunch: salad with fruit and chinese noodles, fresh salad dressing - dinner: chicken, pork + grilled vegetables + desert: homemade chocolate - snacks: pretzels, grapes, apple, orange  Water and green tea  - Pt does not have chronic kidney disease. Last BUN/Cr: Lab Results  Component Value Date   BUN 11 10/14/2012   CREATININE 0.6 10/14/2012  ACR 1.6 3 mo  ago. She is on Lisinopril.  - she does not have HL. Last Lipid panel: Lab Results  Component Value Date   CHOL 127 03/17/2012   HDL 38.80* 03/17/2012   LDLCALC 71 03/17/2012   TRIG 86.0 03/17/2012   CHOLHDL 3 03/17/2012  Sh is not on a statin. - Pt's last eye exam was in 06/2012, with Dr. Elmer Picker. I received the report of this. No DR, no macular edema.  - Denies numbness and tingling in her legs - Dr. Angelia Mould podiatry.  She also has a history of asthma, and is on frequent prednisone tapers, last around 02/2012; also HTN;  h/o nephrolithiasis, s/p lithotripsy; GERD.  ROS: Constitutional: + increased appetite, + weight oss, no fatigue, + hot flashes; increased urination, increased thirst Eyes: no blurry vision, no xerophthalmia ENT:  No sore throat, no nodules palpated in throat, no dysphagia/odynophagia Cardiovascular: no CP/SOB/palpitations/leg swelling Respiratory: no cough/no SOB/ wheezing Gastrointestinal: no N/V/D/C Musculoskeletal: no muscle/joint aches Skin: no rashes Neurological: no tremors/numbness/tingling/dizziness  I reviewed pt's medications, allergies, PMH, social hx, family hx and no changes required, except as mentioned above.  PE: BP 138/78  Temp(Src) 98.8 F (37.1 C) (Oral)  Resp 10  Wt 133 lb 9.6 oz (60.601 kg)  BMI 24.43 kg/m2 Wt Readings from Last 3 Encounters:  10/20/12 133 lb 9.6 oz (60.601 kg)  07/07/12 142 lb (  64.411 kg)  06/06/12 141 lb (63.957 kg)   Constitutional: normal weight, in NAD, looking younger than stated age Eyes: PERRLA, EOMI, no exophthalmos ENT: moist mucous membranes, no thyromegaly, no cervical lymphadenopathy Cardiovascular: RRR, No MRG Respiratory: CTA B Gastrointestinal: abdomen soft, NT, ND, BS+ Musculoskeletal: no deformities, strength intact in all 4 Skin: moist, warm, no rashes Neurological: no tremor with outstretched hands, DTR normal in all 4  ASSESSMENT: 1. DM2, insulin-dependent, uncontrolled, without  complications  PLAN:  1. Patient with diabetes in the last approximately 5 years, slightly improved after starting basal insulin with 40 units of Levemir qhs, but with deterioration since last visit. She cannot pinpoint to what could have caused her high sugars. She is compliant with her Lantus, however she did not take Januvia in the last week, as her daughter was sick in the hospital. I am worried about the fact that her hemoglobin A1c returned very high, at 12.6, she has polyuria and polydipsia, and has lost 9 pounds since last visit. 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 - We discussed about mealtime insulin and correct administration, 15 minutes before every meal. We will start with 6 units of Humalog for every meal, and decreased to 4 units for a small meal. This will most likely not be enough, but I would have the patient return in 3 weeks and would change the doses at that time. I also advised her to contact me with high or low sugars. - she also wants to retry metformin, which I agreed for, for now, at 1000 mg at night, to stop if she develops diarrhea. - I would check an anti-islet cell and anti-GAD antibodies - I will see the patient back in 3 weeks with her sugar log  Office Visit on 10/20/2012  Component Date Value Range Status  . Pancreatic Islet Cell Antibody 10/20/2012 <5  < 5 JDF Units Final   Comment:                             Laboratory Developed Test performed using a reagent labeled by                           the manufacturer as ASR Class I or ASR Class II (non-blood bank).                                                      This test(s) was developed and its performance characteristics                           have been determined by [employer],                           [place], [place]. It has not been cleared or approved by the [place].                           Food and Drug  Administration. The FDA has determined that such  clearance or approval is not necessary. Performance                           characteristics refer to the analytical performance of the test.  . Glutamic Acid Decarb Ab 10/20/2012 <1.0  <=1.0 U/mL Final   Msg sent: Dear Ms Hughes Better, The pancreatic antibodies returned negative, so it does not appear that you have type 1 diabetes. Please continue your current diabetic regimen for now. Please let me know if you have any questions. Sincerely, Carlus Pavlov MD

## 2012-10-20 NOTE — Patient Instructions (Signed)
Please return in 3 weeks with your sugar log. Start metformin 1000 mg at bedtime, but stop if you have diarrhea. Decrease Levemir to 30 units at night. Start Humalog at 6 units with every meal. If you have a small meal, only take 4 units. Please stop at the lab.

## 2012-10-28 LAB — ANTI-ISLET CELL ANTIBODY: Pancreatic Islet Cell Antibody: <5 JDF Units (ref <5)

## 2012-11-14 ENCOUNTER — Ambulatory Visit (INDEPENDENT_AMBULATORY_CARE_PROVIDER_SITE_OTHER): Payer: 59 | Admitting: Internal Medicine

## 2012-11-14 ENCOUNTER — Encounter: Payer: Self-pay | Admitting: Internal Medicine

## 2012-11-14 VITALS — BP 148/88 | HR 74 | Temp 98.2°F | Resp 10 | Ht 62.0 in | Wt 129.0 lb

## 2012-11-14 DIAGNOSIS — E109 Type 1 diabetes mellitus without complications: Secondary | ICD-10-CM

## 2012-11-14 MED ORDER — PEN NEEDLES 31G X 6 MM MISC: Status: DC

## 2012-11-14 MED ORDER — GLUCOSE BLOOD VI STRP: ORAL_STRIP | Status: DC

## 2012-11-14 MED ORDER — INSULIN LISPRO 100 UNIT/ML (KWIKPEN): PEN_INJECTOR | SUBCUTANEOUS | Status: DC

## 2012-11-14 MED ORDER — INSULIN DETEMIR 100 UNIT/ML FLEXPEN: 40.0000 [IU] | PEN_INJECTOR | Freq: Every day | SUBCUTANEOUS | Status: DC

## 2012-11-14 NOTE — Patient Instructions (Signed)
Continue Metformin 500 mg 2x a day. Continue Levemir 40 units at night. Add Humalog 4 units with a small meal and 6 units with a regular meal. Make sure you inject the Humalog 15 min before a meal. Please return in 1 month with your sugar log.

## 2012-11-14 NOTE — Progress Notes (Signed)
Patient ID: Katrina Garrison, female   DOB: August 20, 1940, 72 y.o.   MRN: [id number]  HPI: Katrina Garrison is a 72 y.o.-year-old female, returning for f/u for DM2, dx 2009, insulin-dependent since 2012-13, uncontrolled, without chronic complications, with hospitalization for diabetic coma x 2 (last was 3 years ago). Last visit 1 mo ago.   Latestt hemoglobin A1c: Lab Results  Component Value Date   HGBA1C 12.6* 10/14/2012   HGBA1C 12.9* 03/17/2012   HGBA1C 11.5* 12/04/2010  At last visit, we checked and anti-islet cell and anti-GAD antibodies and these were undetectable.  Patient is on the following regimen: - metformin 500 mg 2x a day - restarted at last visit - Levemir 40 units hs - she stopped Januvia 100 mg daily >> b/c CP >> stopped after stopping Januvia - she did not start Humalog 3 units with a small meal and 4 units if a larger meal - started at last visit Pt was in the past on a regimen of Humalog 75/25 pen - 50 units daily once a day - but was forgetting the insulin 1/7 days. She was taking the insulin at bedtime >> lows at night and highs, mostly in the 200s but also some in the 300s in am. She is doing better on Levemir.  She was also on Glipizide 10 bid in the past. She was on Metformin 1000 mg po bid, but stopped 2/2 diarrhea which is incapacitating, and is impeding her work.  Pt checks her sugars 1x a day and they are: - am: fluctuating from 96-250 >> 150-230 >> 207-307 >> 114-192 - 2 hours after breakfast: 356 >> 161 and 186 - before lunch: upper 100s-up to 200 >> 204 >> 143 and 220 - before dinner: upper 100s-up to 200 >> 234-341 >> 154, 220, 354 - Bedtime >> not checking >> 223-404 >> not checking No lows; she has hypoglycemia awareness at 60.   Meals: - b'fast: egg, bacon, cinnamon toast - rarely, green tea no sugar, coffee - lunch: salad with fruit and chinese noodles, fresh salad dressing - dinner: chicken, pork + grilled vegetables + desert: homemade chocolate -  snacks: pretzels, grapes, apple, orange  Water and green tea  - Pt does not have chronic kidney disease. Last BUN/Cr: Lab Results  Component Value Date   BUN 11 10/14/2012   CREATININE 0.6 10/14/2012  She is on Lisinopril.  - she does not have HL. Last Lipid panel: Lab Results  Component Value Date   CHOL 127 03/17/2012   HDL 38.80* 03/17/2012   LDLCALC 71 03/17/2012   TRIG 86.0 03/17/2012   CHOLHDL 3 03/17/2012  [name] is not on a statin. - Pt's last eye exam was in 06/2012, with Dr. Elmer Picker. I received the report of this. No DR, no macular edema. She will go back to eval. her cataracts. - Denies numbness and tingling in her legs - Dr. Angelia Mould podiatry.  She also has a history of asthma, and is on frequent prednisone tapers, last around 02/2012; also HTN;  h/o nephrolithiasis, s/p lithotripsy; GERD.  ROS: Constitutional: + decreased appetite, + weight loss, no fatigue, + hot flashes; + increased urination, + increased thirst Eyes: no blurry vision, no xerophthalmia ENT:  No sore throat, no nodules palpated in throat, no dysphagia/odynophagia Cardiovascular: had CP from Januvia, resolved/no SOB/palpitations/leg swelling Respiratory: no cough/no SOB/ wheezing Gastrointestinal: no N/V/D/C Musculoskeletal: no muscle/joint aches Skin: no rashes, + occasional itching  I reviewed pt's medications, allergies, PMH, social hx, family hx and no changes  required, except as mentioned above.  PE: BP 148/88  Pulse 74  Temp(Src) 98.2 F (36.8 C)  Resp 10  Ht 5\' 2"  (1.575 m)  Wt 129 lb (58.514 kg)  BMI 23.59 kg/m2 Wt Readings from Last 3 Encounters:  11/14/12 129 lb (58.514 kg)  10/20/12 133 lb 9.6 oz (60.601 kg)  07/07/12 142 lb (64.411 kg)   Constitutional: normal weight, in NAD, looking younger than stated age Eyes: PERRLA, EOMI, no exophthalmos ENT: moist mucous membranes, no thyromegaly, no cervical lymphadenopathy Cardiovascular: RRR, No MRG Respiratory: CTA B Gastrointestinal:  abdomen soft, NT, ND, BS+ Musculoskeletal: no deformities, strength intact in all 4 Skin: moist, warm, no rashes  ASSESSMENT: 1. DM2, insulin-dependent, uncontrolled, without complications  PLAN:  1. Patient with very poorly controlled diabetes, on basal insulin, and now mealtime insulin, too. 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 - sugars improved after addition of Metformin, which she appears to tolerate well. - I advised her to: Patient Instructions  Continue Metformin 500 mg 2x a day. Continue Levemir 40 units at night. Add Humalog 4 units with a small meal and 6 units with a regular meal - only add these with lunch and dinner. Make sure   you inject the Humalog 15 min before a meal. Please return in 1 month with your sugar log.  - she already had the flu vaccine this fall - refilled: Humalog, Levemir, strips, pen needles - I will see the patient back in 1 mo with her sugar log

## 2012-12-16 ENCOUNTER — Encounter: Payer: Self-pay | Admitting: Internal Medicine

## 2012-12-16 ENCOUNTER — Ambulatory Visit (INDEPENDENT_AMBULATORY_CARE_PROVIDER_SITE_OTHER): Payer: 59 | Admitting: Internal Medicine

## 2012-12-16 VITALS — BP 128/80 | Temp 98.8°F | Resp 10 | Wt 135.0 lb

## 2012-12-16 DIAGNOSIS — E109 Type 1 diabetes mellitus without complications: Secondary | ICD-10-CM

## 2012-12-16 MED ORDER — METFORMIN HCL 500 MG PO TABS: 1000.0000 mg | ORAL_TABLET | Freq: Every day | ORAL | Status: DC

## 2012-12-16 MED ORDER — INSULIN DETEMIR 100 UNIT/ML FLEXPEN: 40.0000 [IU] | PEN_INJECTOR | Freq: Every day | SUBCUTANEOUS | Status: DC

## 2012-12-16 NOTE — Patient Instructions (Addendum)
Please take 1000 mg Metformin at night  - try to get the generic Metformin from [facility]. Keep on the same doses of insulin for now. Please return in 2 months with your sugar log.  Great job!

## 2012-12-16 NOTE — Progress Notes (Signed)
Patient ID: Katrina Garrison, female   DOB: [birth year], 72 y.o.   MRN: [id number]  HPI: Katrina Garrison is a 72 y.o.-year-old female, returning for f/u for DM2, dx 2009, insulin-dependent since 2012-13, uncontrolled, without chronic complications, with hospitalization for diabetic coma x 2 (last was 3 years ago). Last visit 1 mo ago.   Latest hemoglobin A1c: Lab Results  Component Value Date   HGBA1C 12.6* 10/14/2012   HGBA1C 12.9* 03/17/2012   HGBA1C 11.5* 12/04/2010  Recent anti-islet cell and anti-GAD antibodies and these were undetectable.  Patient is on the following regimen: - metformin 500 mg 2x a day - Levemir 40 units hs - started Humalog - advanced to 12 units tid Stopped Januvia 100 mg daily >> b/c CP   Pt was in the past on a regimen of Humalog 75/25 pen - 50 units daily once a day - but was forgetting the insulin 1/7 days. She was taking the insulin at bedtime >> lows at night and highs, mostly in the 200s but also some in the 300s in am. She is doing better on Levemir.  She was also on Glipizide 10 bid in the past. She was on Metformin 1000 mg po bid, but stopped 2/2 diarrhea which is incapacitating, and is impeding her work.  Pt checks her sugars 2x (brings log) a day and they are much improved after starting mealtime insulin: - am: fluctuating from 96-250 >> 150-230 >> 207-307 >> 114-192 >> 129-296 (<200) - 2 hours after breakfast: 356 >> 161 and 186 >> 187 - before lunch: upper 100s-up to 200 >> 204 >> 143 and 220 >> 123-146 - 2h after lunch: 161 - before dinner: upper 100s-up to 200 >> 234-341 >> 154, 220, 354 >> 146-170 (highest 288) - after dinner: 126 - Bedtime >> not checking >> 223-404 >> not checking >> 124-156 No lows; she has hypoglycemia awareness at 60.   - Pt does not have chronic kidney disease. Last BUN/Cr: Lab Results  Component Value Date   BUN 11 10/14/2012   CREATININE 0.6 10/14/2012  She is on Lisinopril.  - she does not have HL. Last Lipid  panel: Lab Results  Component Value Date   CHOL 127 03/17/2012   HDL 38.80* 03/17/2012   LDLCALC 71 03/17/2012   TRIG 86.0 03/17/2012   CHOLHDL 3 03/17/2012  Sh is not on a statin. - Pt's last eye exam was in 06/2012, with Dr. Elmer Picker. I received the report of this. No DR, no macular edema.  - Denies numbness and tingling in her legs - Dr. Angelia Mould podiatry.  She also has a history of asthma, and is on frequent prednisone tapers, last around 02/2012; also HTN;  h/o nephrolithiasis, s/p lithotripsy; GERD.  ROS: Constitutional: + decreased appetite, + weight gain, + fatigue, improved urination, nocturia only x 1 now, + hot flushes Eyes: no blurry vision, no xerophthalmia ENT:  No sore throat, no nodules palpated in throat, no dysphagia/odynophagia Cardiovascular: had CP from Januvia, resolved/no SOB/palpitations/leg swelling Respiratory: no cough/no SOB/ wheezing Gastrointestinal: no N/V/D/+ C Musculoskeletal: no muscle/joint aches Skin: no rashes  I reviewed pt's medications, allergies, PMH, social hx, family hx and no changes required, except as mentioned above.  PE: BP 128/80  Temp(Src) 98.8 F (37.1 C) (Oral)  Resp 10  Wt 135 lb (61.236 kg) Wt Readings from Last 3 Encounters:  12/16/12 135 lb (61.236 kg)  11/14/12 129 lb (58.514 kg)  10/20/12 133 lb 9.6 oz (60.601 kg)   Constitutional: normal weight,  in NAD, looking younger than stated age Eyes: PERRLA, EOMI, no exophthalmos ENT: moist mucous membranes, no thyromegaly, no cervical lymphadenopathy Cardiovascular: RRR, No MRG Respiratory: CTA B Gastrointestinal: abdomen soft, NT, ND, BS+ Musculoskeletal: no deformities, strength intact in all 4 Skin: moist, warm, no rashes  ASSESSMENT: 1. DM2, insulin-dependent, uncontrolled, without complications  PLAN:  1. Patient with very poorly controlled diabetes, on basal insulin, and now mealtime insulin, too, with much improved control. - I advised her to: Patient Instructions   Continue Metformin 500 mg x2 >> move all dose at night Continue Levemir 40 units at night. Continue Humalog 12 units with a regular meal and 15 units before a large meal. Please return in 2 months with sugar log.  - she already had the flu vaccine this fall - refilled: Levemir and Metformin >> advised to get the generic from [facility] - I will see the patient back in 2 mo with her sugar log  -need A1c then

## 2013-01-09 ENCOUNTER — Telehealth: Payer: Self-pay | Admitting: Family Medicine

## 2013-01-09 NOTE — Telephone Encounter (Signed)
Patient Information:  Caller Name: Jordan  Phone: [phone]  Patient: Katrina Garrison, Katrina Garrison  Gender: Female  DOB: Sep 01, 1940  Age: 72 Years  PCP: Kelle Darting [facility])  Office Follow Up:  Does the office need to follow up with this patient?: No  Instructions For The Office: N/A  RN Note:  Patient can't come in as advised today, request appt for 01/10/13; PCP not available, scheduled with PA Orvan Falconer;  Emergent symptoms discussed with verbal understanding.  Symptoms  Reason For Call & Symptoms: Calling for pain in lower pelvic area and flank/ back pain, nausea, vomiting; Urinating without pain; Hasn't checked her temp but feels feverish; Cough, no drainage.  Thinks it may be kidney stones or diverticulits.  Blood sugars wnl's.  Last time she threw up was 12/13.  Reviewed Health History In EMR: Yes  Reviewed Medications In EMR: Yes  Reviewed Allergies In EMR: Yes  Reviewed Surgeries / Procedures: Yes  Date of Onset of Symptoms: 01/02/2013  Treatments Tried: Aleve and Ibuprophen  Treatments Tried Worked: Yes  Guideline(s) Used:  Abdominal Pain - Female  Flank Pain  Disposition Per Guideline:   See Today in Office  Reason For Disposition Reached:   Moderate pain (e.g., interferes with normal activities or awakens from sleep)  Advice Given:  Cold or Heat:  Cold Pack: For pain or swelling, use a cold pack or ice wrapped in a wet cloth. Put it on the sore area for 20 minutes. Repeat 4 times on the first day, then as needed.  Heat Pack: If pain lasts over 2 days, apply heat to the sore area. Use a heat pack, heating pad, or warm wet washcloth. Do this for 10 minutes, then as needed.  Activity:  Continue normal activities as much as your pain permits.  Avoid any activities that significantly increase the pain. Avoid heavy lifting and strenuous exercise until completely well. Staying in bed is not needed.  Pain Medicines:  For pain relief, you can take either  acetaminophen, ibuprofen, or naproxen.  Call Back If:  Fever over 100.5 F (38.1 C)  Burning with urination or blood in urine  Pain lasts over 3 days  You become worse.  Patient Will Follow Care Advice:  YES  Appointment Scheduled:  01/10/2013 11:30:00 Appointment Scheduled Provider:  Adline Mango [facility])

## 2013-01-10 ENCOUNTER — Ambulatory Visit (INDEPENDENT_AMBULATORY_CARE_PROVIDER_SITE_OTHER): Payer: 59 | Admitting: Family

## 2013-01-10 ENCOUNTER — Ambulatory Visit (INDEPENDENT_AMBULATORY_CARE_PROVIDER_SITE_OTHER)
Admission: RE | Admit: 2013-01-10 | Discharge: 2013-01-10 | Disposition: A | Discharge status: Home / Self Care | Payer: 59 | Source: Ambulatory Visit | Attending: Family | Admitting: Family

## 2013-01-10 ENCOUNTER — Encounter: Payer: Self-pay | Admitting: Family

## 2013-01-10 VITALS — BP 150/90 | Temp 98.8°F | Wt 135.0 lb

## 2013-01-10 DIAGNOSIS — R102 Pelvic and perineal pain: Secondary | ICD-10-CM

## 2013-01-10 DIAGNOSIS — M545 Low back pain, unspecified: Secondary | ICD-10-CM

## 2013-01-10 NOTE — Patient Instructions (Signed)
[address] [place]. For KUB

## 2013-01-10 NOTE — Telephone Encounter (Signed)
Pt would like results of xray done today. Thanks. Pt states you must dial all digits of phone number. Pt also needs work note for tomorrow faxed to [phone]  Attn : Arman Bogus

## 2013-01-10 NOTE — Progress Notes (Signed)
Subjective:    Patient ID: Orpah Melter, female    DOB: Dec 16, 1940, 72 y.o.   MRN: [id number]  HPI 72 year old African American female, patient of Dr. Tawanna Cooler is in today with complaints of lower abdominal pain and low back pain x1 week. Patient reports one week ago she had excruciating back pain that has decreased in intensity and today is the best day she has felt. Denies any constipation. Has a history of renal calculi. Denies any blood in the urine, foul-smelling urine, urinary frequency urgency. Denies any concerns of any sexually transmitted diseases. She has no active pain now.   Review of Systems  Constitutional: Negative.   Respiratory: Negative.   Cardiovascular: Negative.   Gastrointestinal: Positive for abdominal pain. Negative for diarrhea, constipation, blood in stool and abdominal distention.       Pelvic pain  Genitourinary: Negative.   Musculoskeletal: Positive for back pain.  Skin: Negative.   Neurological: Negative.   Psychiatric/Behavioral: Negative.    Past Medical History  Diagnosis Date  . Diabetes mellitus type II   . GERD (gastroesophageal reflux disease)   . Hypertension   . History of lithotripsy     x3 for kidney stones    History   Social History  . Marital Status: Divorced    Spouse Name: N/A    Number of Children: N/A  . Years of Education: N/A   Occupational History  . Not on file.   Social History Main Topics  . Smoking status: Former Smoker -- 1.5 years    Quit date: 01/28/1985  . Smokeless tobacco: Not on file  . Alcohol Use: Not on file  . Drug Use: Not on file  . Sexual Activity: Not on file   Other Topics Concern  . Not on file   Social History Narrative  . No narrative on file    Past Surgical History  Procedure Laterality Date  . Abdominal hysterectomy    . Childbirth x 2      Family History  Problem Relation Age of Onset  . Cancer Mother     throat  . Diabetes Mother   . Hypertension Mother   . Alcohol  abuse Father   . Hypertension Father   . Multiple sclerosis Sister   . Multiple sclerosis Sister     Allergies  Allergen Reactions  . Metformin And Related Diarrhea  . Sulfamethoxazole     REACTION: "comatose"    Current Outpatient Prescriptions on File Prior to Visit  Medication Sig Dispense Refill  . albuterol (PROVENTIL HFA;VENTOLIN HFA) 108 (90 BASE) MCG/ACT inhaler Inhale 2 puffs into the lungs every 6 (six) hours as needed for wheezing.  1 Inhaler  1  . amLODipine (NORVASC) 5 MG tablet Take 5 mg by mouth daily.       Marland Kitchen aspirin 81 MG tablet Take 81 mg by mouth daily.        Marland Kitchen b complex vitamins tablet Take 1 tablet by mouth daily.        . Cholecalciferol (VITAMIN D3) 10000 UNITS capsule Take 10,000 Units by mouth daily.        . fexofenadine (ALLEGRA) 180 MG tablet Take 180 mg by mouth daily.        Marland Kitchen glucose blood (ONE TOUCH TEST STRIPS) test strip Use 3x a day as advised  200 each  11  . HYDROcodone-homatropine (HYCODAN) 5-1.5 MG/5ML syrup       . ibuprofen (ADVIL,MOTRIN) 200 MG tablet Take 200 mg by mouth  every 6 (six) hours as needed.        . Insulin Detemir (LEVEMIR FLEXPEN) 100 UNIT/ML SOPN Inject 40 Units into the skin at bedtime.  5 pen  11  . insulin lispro (HUMALOG KWIKPEN) 100 UNIT/ML SOPN Inject 12 units a day as advised  5 pen  11  . Insulin Pen Needle (PEN NEEDLES) 31G X 6 MM MISC Use the pen 3x a day  200 each  11  . INSULIN SYRINGE .5CC/29G (MONOJECT INS SYR .5CC/29G) 29G X 1/2" 0.5 ML MISC 1 each by Does not apply route as directed.  100 each  3  . lisinopril (PRINIVIL,ZESTRIL) 20 MG tablet TAKE TWO TABLETS BY MOUTH EVERY DAY  180 tablet  3  . metFORMIN (GLUCOPHAGE) 500 MG tablet Take 2 tablets (1,000 mg total) by mouth daily with supper.  180 tablet  3  . ONE TOUCH LANCETS MISC by Does not apply route daily.        . Pediatric Multivit-Minerals-C (RA GUMMY VITAMINS & MINERALS PO) Take 2 each by mouth daily.       No current facility-administered medications  on file prior to visit.    BP 150/90  Temp(Src) 98.8 F (37.1 C) (Oral)  Wt 135 lb (61.236 kg)chart    Objective:   Physical Exam  Constitutional: She is oriented to person, place, and time. She appears well-developed and well-nourished.  Neck: Normal range of motion. Neck supple.  Cardiovascular: Normal rate, regular rhythm and normal heart sounds.   Pulmonary/Chest: Effort normal and breath sounds normal.  Abdominal: Soft. Bowel sounds are normal. She exhibits no distension. There is no tenderness. There is no rebound.  Musculoskeletal: Normal range of motion.  Neurological: She is alert and oriented to person, place, and time.  Skin: Skin is warm.  Psychiatric: She has a normal mood and affect.          Assessment & Plan:  Assessment: 1. Low back pain 2. History of renal calculi 3. Pelvic pain  Plan: Urine culture sent. Will notify patient pending results. KUB to rule out renal calculi.

## 2013-01-11 ENCOUNTER — Telehealth: Payer: Self-pay | Admitting: Family Medicine

## 2013-01-11 MED ORDER — TRAMADOL HCL 50 MG PO TABS: 50.0000 mg | ORAL_TABLET | Freq: Three times a day (TID) | ORAL | Status: DC | PRN

## 2013-01-11 NOTE — Telephone Encounter (Signed)
Pt is calling to inquire about her x ray results.  Also would like a mild sedative to be called in for the pain in her stomach. Please advise.

## 2013-01-11 NOTE — Telephone Encounter (Signed)
Pt would like a note to be faxed into her office stating that she is unavailable to return to work until Monday.  Please fax to [phone] Attn: Arman Bogus. Thanks

## 2013-01-11 NOTE — Telephone Encounter (Signed)
Per Padonda, call in tramadol 50 mg q8hrs prn pain.  Rx phoned in and pt aware

## 2013-01-11 NOTE — Telephone Encounter (Signed)
Patient aware.

## 2013-01-12 NOTE — Telephone Encounter (Signed)
Note printed and faxed.

## 2013-01-17 ENCOUNTER — Encounter: Payer: Self-pay | Admitting: *Deleted

## 2013-01-17 ENCOUNTER — Telehealth: Payer: Self-pay | Admitting: Family Medicine

## 2013-01-17 ENCOUNTER — Ambulatory Visit: Payer: Self-pay | Admitting: Internal Medicine

## 2013-01-17 NOTE — Telephone Encounter (Signed)
Patient Information:  Caller Name: Johnnie  Phone: [phone]  Patient: Katrina Garrison, Katrina Garrison  Gender: Female  DOB: 04/07/1940  Age: 72 Years  PCP: Kelle Darting [facility])  Office Follow Up:  Does the office need to follow up with this patient?: No  Instructions For The Office: N/A   Symptoms  Reason For Call & Symptoms: Patient caling about lower abdominal pain.  Onset reported approximately 3 weeks. She was seen on 01/10/13 and had x-rays done.  Reports history of diverticulitis and states she suspects that is the cause of her current pains.  Pain rated at 9 of 10; relief with Ibuprofen or Aleve, but it returns when medication wears off.  Lower left abdominal pain.  Emergent symptoms ruled out.  See Today in Office per Abdominal Pain protocol due to Moderate or mild pain that comes and goes lasts > 24 hours and Age > 60 years.  Caller states she has appointment for 09:45 on 01/18/13.  Reviewed Health History In EMR: Yes  Reviewed Medications In EMR: Yes  Reviewed Allergies In EMR: Yes  Reviewed Surgeries / Procedures: Yes  Date of Onset of Symptoms: 12/27/2012  Treatments Tried: Ibuprofen, Aleve, Tramadol  Treatments Tried Worked: No  Guideline(s) Used:  Abdominal Pain - Female  Disposition Per Guideline:   See Today in Office  Reason For Disposition Reached:   Moderate or mild pain that comes and goes (cramps) lasts > 24 hours  Advice Given:  Diet:  Slowly advance diet from clear liquids to a bland diet  Avoid greasy or fatty foods.  Rest:  Lie down and rest until you feel better.  Call Back If:  You become worse.  RN Overrode Recommendation:  Go To [facility]  Patient unsure if she has transportation.  States if she cannot find transportation she will come to office at 09:45 on 01/18/13.

## 2013-01-18 ENCOUNTER — Other Ambulatory Visit: Payer: Self-pay | Admitting: *Deleted

## 2013-01-18 ENCOUNTER — Ambulatory Visit (INDEPENDENT_AMBULATORY_CARE_PROVIDER_SITE_OTHER): Payer: 59 | Admitting: Internal Medicine

## 2013-01-18 ENCOUNTER — Ambulatory Visit (HOSPITAL_COMMUNITY)
Admission: RE | Admit: 2013-01-18 | Discharge: 2013-01-18 | Disposition: A | Discharge status: Home / Self Care | Payer: 59 | Source: Ambulatory Visit | Attending: Internal Medicine | Admitting: Internal Medicine

## 2013-01-18 ENCOUNTER — Encounter (HOSPITAL_COMMUNITY): Payer: Self-pay | Admitting: Emergency Medicine

## 2013-01-18 ENCOUNTER — Encounter: Payer: Self-pay | Admitting: Internal Medicine

## 2013-01-18 ENCOUNTER — Inpatient Hospital Stay (HOSPITAL_COMMUNITY)
Admission: EM | Admit: 2013-01-18 | Discharge: 2013-01-22 | DRG: 394 | Disposition: A | Discharge status: Home / Self Care | Payer: 59 | Attending: Internal Medicine | Admitting: Internal Medicine

## 2013-01-18 ENCOUNTER — Encounter (HOSPITAL_COMMUNITY): Payer: Self-pay

## 2013-01-18 VITALS — BP 146/90 | HR 84 | Temp 98.0°F | Resp 16 | Ht 62.0 in | Wt 134.0 lb

## 2013-01-18 DIAGNOSIS — I10 Essential (primary) hypertension: Secondary | ICD-10-CM

## 2013-01-18 DIAGNOSIS — E109 Type 1 diabetes mellitus without complications: Secondary | ICD-10-CM

## 2013-01-18 DIAGNOSIS — K219 Gastro-esophageal reflux disease without esophagitis: Secondary | ICD-10-CM

## 2013-01-18 DIAGNOSIS — E870 Hyperosmolality and hypernatremia: Secondary | ICD-10-CM | POA: Diagnosis not present

## 2013-01-18 DIAGNOSIS — Z794 Long term (current) use of insulin: Secondary | ICD-10-CM

## 2013-01-18 DIAGNOSIS — R109 Unspecified abdominal pain: Secondary | ICD-10-CM

## 2013-01-18 DIAGNOSIS — K625 Hemorrhage of anus and rectum: Secondary | ICD-10-CM | POA: Insufficient documentation

## 2013-01-18 DIAGNOSIS — Z79899 Other long term (current) drug therapy: Secondary | ICD-10-CM

## 2013-01-18 DIAGNOSIS — K559 Vascular disorder of intestine, unspecified: Principal | ICD-10-CM

## 2013-01-18 DIAGNOSIS — R1031 Right lower quadrant pain: Secondary | ICD-10-CM | POA: Insufficient documentation

## 2013-01-18 DIAGNOSIS — R1032 Left lower quadrant pain: Secondary | ICD-10-CM | POA: Diagnosis present

## 2013-01-18 DIAGNOSIS — E119 Type 2 diabetes mellitus without complications: Secondary | ICD-10-CM

## 2013-01-18 DIAGNOSIS — K922 Gastrointestinal hemorrhage, unspecified: Secondary | ICD-10-CM

## 2013-01-18 DIAGNOSIS — IMO0001 Reserved for inherently not codable concepts without codable children: Secondary | ICD-10-CM

## 2013-01-18 DIAGNOSIS — Z888 Allergy status to other drugs, medicaments and biological substances status: Secondary | ICD-10-CM

## 2013-01-18 DIAGNOSIS — R112 Nausea with vomiting, unspecified: Secondary | ICD-10-CM

## 2013-01-18 DIAGNOSIS — D62 Acute posthemorrhagic anemia: Secondary | ICD-10-CM | POA: Diagnosis present

## 2013-01-18 DIAGNOSIS — Z87891 Personal history of nicotine dependence: Secondary | ICD-10-CM

## 2013-01-18 LAB — CBC WITH DIFFERENTIAL/PLATELET
Eosinophils Absolute: 0.1 10*3/uL (ref 0.0–0.7)
Eosinophils Relative: 0.7 % (ref 0.0–5.0)
HCT: 35.9 % — ABNORMAL LOW (ref 36.0–46.0)
Hemoglobin: 11.6 g/dL — ABNORMAL LOW (ref 12.0–15.0)
Lymphs Abs: 1.7 10*3/uL (ref 0.7–4.0)
MCHC: 32.3 g/dL (ref 30.0–36.0)
MCV: 81.6 fl (ref 78.0–100.0)
Monocytes Absolute: 0.5 10*3/uL (ref 0.1–1.0)
Neutrophils Relative %: 78.7 % — ABNORMAL HIGH (ref 43.0–77.0)
Platelets: 294 10*3/uL (ref 150.0–400.0)
RDW: 14.5 % (ref 11.5–14.6)
WBC: 10.7 10*3/uL — ABNORMAL HIGH (ref 4.5–10.5)

## 2013-01-18 LAB — URINALYSIS, ROUTINE W REFLEX MICROSCOPIC
Bilirubin Urine: NEGATIVE
Glucose, UA: NEGATIVE mg/dL
Ketones, ur: NEGATIVE mg/dL
Leukocytes, UA: NEGATIVE
Nitrite: NEGATIVE
Protein, ur: NEGATIVE mg/dL
Urobilinogen, UA: 0.2 mg/dL (ref 0.0–1.0)

## 2013-01-18 LAB — BASIC METABOLIC PANEL
BUN: 9 mg/dL (ref 6–23)
CO2: 28 mEq/L (ref 19–32)
Calcium: 9.5 mg/dL (ref 8.4–10.5)
Creatinine, Ser: 0.5 mg/dL (ref 0.4–1.2)
GFR: 149.13 mL/min (ref >60.00)
Glucose, Bld: 151 mg/dL — ABNORMAL HIGH (ref 70–99)
Sodium: 142 mEq/L (ref 135–145)

## 2013-01-18 LAB — COMPREHENSIVE METABOLIC PANEL
ALT: 8 U/L (ref 0–35)
Albumin: 3.4 g/dL — ABNORMAL LOW (ref 3.5–5.2)
Alkaline Phosphatase: 93 U/L (ref 39–117)
Calcium: 9.4 mg/dL (ref 8.4–10.5)
Chloride: 101 mEq/L (ref 96–112)
GFR calc Af Amer: >90 mL/min (ref >90)
Glucose, Bld: 158 mg/dL — ABNORMAL HIGH (ref 70–99)
Potassium: 3.7 mEq/L (ref 3.5–5.1)
Sodium: 137 mEq/L (ref 135–145)
Total Protein: 7.1 g/dL (ref 6.0–8.3)

## 2013-01-18 LAB — TYPE AND SCREEN
ABO/RH(D): A POS
Antibody Screen: NEGATIVE

## 2013-01-18 LAB — GLUCOSE, CAPILLARY: Glucose-Capillary: 355 mg/dL — ABNORMAL HIGH (ref 70–99)

## 2013-01-18 LAB — LIPASE, BLOOD: Lipase: 26 U/L (ref 11–59)

## 2013-01-18 LAB — ABO/RH: ABO/RH(D): A POS

## 2013-01-18 MED ORDER — METRONIDAZOLE IN NACL 5-0.79 MG/ML-% IV SOLN
500.0000 mg | Freq: Three times a day (TID) | INTRAVENOUS | Status: DC
  Administered 2013-01-19 – 2013-01-21 (×5): 500 mg via INTRAVENOUS
  Filled 2013-01-18 (×7): qty 100

## 2013-01-18 MED ORDER — IOHEXOL 300 MG/ML  SOLN
100.0000 mL | Freq: Once | INTRAMUSCULAR | Status: AC | PRN
  Administered 2013-01-18: 100 mL via INTRAVENOUS

## 2013-01-18 MED ORDER — FAMOTIDINE 20 MG PO TABS
20.0000 mg | ORAL_TABLET | Freq: Every day | ORAL | Status: DC
  Administered 2013-01-18 – 2013-01-19 (×2): 20 mg via ORAL
  Filled 2013-01-18 (×2): qty 1

## 2013-01-18 MED ORDER — HYDROMORPHONE HCL PF 1 MG/ML IJ SOLN
1.0000 mg | Freq: Once | INTRAMUSCULAR | Status: AC
  Administered 2013-01-18: 1 mg via INTRAVENOUS
  Filled 2013-01-18: qty 1

## 2013-01-18 MED ORDER — INSULIN ASPART 100 UNIT/ML ~~LOC~~ SOLN
0.0000 [IU] | Freq: Three times a day (TID) | SUBCUTANEOUS | Status: DC
  Administered 2013-01-19: 2 [IU] via SUBCUTANEOUS
  Administered 2013-01-19: 1 [IU] via SUBCUTANEOUS
  Administered 2013-01-19: 3 [IU] via SUBCUTANEOUS

## 2013-01-18 MED ORDER — LISINOPRIL 20 MG PO TABS
20.0000 mg | ORAL_TABLET | Freq: Every day | ORAL | Status: DC
  Administered 2013-01-18 – 2013-01-22 (×5): 20 mg via ORAL
  Filled 2013-01-18 (×6): qty 1

## 2013-01-18 MED ORDER — AMOXICILLIN-POT CLAVULANATE 875-125 MG PO TABS: 1.0000 | ORAL_TABLET | Freq: Two times a day (BID) | ORAL | Status: DC

## 2013-01-18 MED ORDER — CIPROFLOXACIN IN D5W 400 MG/200ML IV SOLN
400.0000 mg | Freq: Two times a day (BID) | INTRAVENOUS | Status: DC
  Administered 2013-01-18 – 2013-01-21 (×6): 400 mg via INTRAVENOUS
  Filled 2013-01-18 (×7): qty 200

## 2013-01-18 MED ORDER — SODIUM CHLORIDE 0.9 % IV SOLN
INTRAVENOUS | Status: AC
  Administered 2013-01-18: 17:00:00 via INTRAVENOUS

## 2013-01-18 MED ORDER — ONDANSETRON HCL 4 MG/2ML IJ SOLN
4.0000 mg | Freq: Four times a day (QID) | INTRAMUSCULAR | Status: DC | PRN
  Administered 2013-01-18: 4 mg via INTRAVENOUS
  Filled 2013-01-18: qty 2

## 2013-01-18 MED ORDER — SODIUM CHLORIDE 0.9 % IV SOLN
INTRAVENOUS | Status: DC
  Administered 2013-01-19 (×2): 75 mL/h via INTRAVENOUS
  Administered 2013-01-20: 06:00:00 via INTRAVENOUS

## 2013-01-18 MED ORDER — AMLODIPINE BESYLATE 5 MG PO TABS
5.0000 mg | ORAL_TABLET | Freq: Every day | ORAL | Status: DC
  Administered 2013-01-19 – 2013-01-22 (×4): 5 mg via ORAL
  Filled 2013-01-18 (×4): qty 1

## 2013-01-18 MED ORDER — SODIUM CHLORIDE 0.9 % IV BOLUS (SEPSIS)
250.0000 mL | Freq: Once | INTRAVENOUS | Status: AC
  Administered 2013-01-18: 250 mL via INTRAVENOUS

## 2013-01-18 MED ORDER — METRONIDAZOLE IN NACL 5-0.79 MG/ML-% IV SOLN
500.0000 mg | Freq: Three times a day (TID) | INTRAVENOUS | Status: DC
  Filled 2013-01-18: qty 100

## 2013-01-18 MED ORDER — INSULIN DETEMIR 100 UNIT/ML ~~LOC~~ SOLN
15.0000 [IU] | Freq: Every day | SUBCUTANEOUS | Status: DC
  Administered 2013-01-18 – 2013-01-19 (×2): 15 [IU] via SUBCUTANEOUS
  Filled 2013-01-18 (×2): qty 0.15

## 2013-01-18 MED ORDER — ONDANSETRON HCL 4 MG/2ML IJ SOLN
4.0000 mg | Freq: Once | INTRAMUSCULAR | Status: AC
  Administered 2013-01-18: 4 mg via INTRAVENOUS
  Filled 2013-01-18: qty 2

## 2013-01-18 MED ORDER — SODIUM CHLORIDE 0.9 % IV SOLN: INTRAVENOUS | Status: DC

## 2013-01-18 MED ORDER — CIPROFLOXACIN IN D5W 400 MG/200ML IV SOLN
400.0000 mg | Freq: Two times a day (BID) | INTRAVENOUS | Status: DC
  Filled 2013-01-18: qty 200

## 2013-01-18 MED ORDER — ACETAMINOPHEN 650 MG RE SUPP: 650.0000 mg | Freq: Four times a day (QID) | RECTAL | Status: DC | PRN

## 2013-01-18 MED ORDER — MORPHINE SULFATE 2 MG/ML IJ SOLN
1.0000 mg | INTRAMUSCULAR | Status: DC | PRN
  Administered 2013-01-18 – 2013-01-19 (×2): 1 mg via INTRAVENOUS
  Filled 2013-01-18 (×2): qty 1

## 2013-01-18 MED ORDER — ACETAMINOPHEN 325 MG PO TABS
650.0000 mg | ORAL_TABLET | Freq: Four times a day (QID) | ORAL | Status: DC | PRN
  Filled 2013-01-18: qty 2

## 2013-01-18 MED ORDER — ONDANSETRON HCL 4 MG PO TABS: 4.0000 mg | ORAL_TABLET | Freq: Four times a day (QID) | ORAL | Status: DC | PRN

## 2013-01-18 NOTE — ED Provider Notes (Signed)
CSN: [id number]     Arrival date & time 01/18/13  1343 History   First MD Initiated Contact with Patient 01/18/13 1511     Chief Complaint  Patient presents with  . Abdominal Pain  . Rectal Bleeding   (Consider location/radiation/quality/duration/timing/severity/associated sxs/prior Treatment) Patient is a 72 y.o. female presenting with abdominal pain and hematochezia. The history is provided by the patient.  Abdominal Pain Associated symptoms: diarrhea, fatigue, hematochezia, nausea and vomiting   Associated symptoms: no chest pain, no dysuria, no fever and no shortness of breath   Rectal Bleeding Associated symptoms: abdominal pain and vomiting   Associated symptoms: no fever    patient referred in by primary care off this for rectal bleeding that started yesterday. Has had 6-8 bowel movements yesterday and 60 bowel movements today with bright red blood. Your rectal exam in the opposite which confirmed blood. No other abnormalities felt on rectal exam. Patient had blood work done and was referred in for an outpatient CT scan and was told to be seen in the emergency department for admission. Patient's had no syncope or shortness of breath no chest pain no fevers does have lower quadrant abdominal crampy abdominal pain for the past 2 days. Patient had a gastroenteritis-type illness of about a week ago associated with several episodes of vomiting and diarrhea but no blood. Patient has a history of diverticulitis in the past. Patient states her abdominal pain is 8-9/10 crampy in nature lower corner is nonradiating not made better or worse by anything.  Past Medical History  Diagnosis Date  . Diabetes mellitus type II   . GERD (gastroesophageal reflux disease)   . Hypertension   . History of lithotripsy     x3 for kidney stones   Past Surgical History  Procedure Laterality Date  . Abdominal hysterectomy    . Childbirth x 2     Family History  Problem Relation Age of Onset  . Cancer  Mother     throat  . Diabetes Mother   . Hypertension Mother   . Alcohol abuse Father   . Hypertension Father   . Multiple sclerosis Sister   . Multiple sclerosis Sister    History  Substance Use Topics  . Smoking status: Former Smoker -- 1.5 years    Quit date: 01/28/1985  . Smokeless tobacco: Not on file  . Alcohol Use: Not on file   OB History   Grav Para Term Preterm Abortions TAB SAB Ect Mult Living                 Review of Systems  Constitutional: Positive for fatigue. Negative for fever.  HENT: Negative for congestion.   Eyes: Negative for redness and visual disturbance.  Respiratory: Negative for shortness of breath.   Cardiovascular: Negative for chest pain.  Gastrointestinal: Positive for nausea, vomiting, abdominal pain, diarrhea, blood in stool, hematochezia and anal bleeding.  Genitourinary: Negative for dysuria.  Musculoskeletal: Negative for back pain.  Skin: Negative for rash.  Neurological: Negative for headaches.  Hematological: Does not bruise/bleed easily.  Psychiatric/Behavioral: Negative for confusion.    Allergies  Metformin and related and Sulfamethoxazole  Home Medications   Current Outpatient Rx  Name  Route  Sig  Dispense  Refill  . amLODipine (NORVASC) 5 MG tablet   Oral   Take 5 mg by mouth daily.          Marland Kitchen ibuprofen (ADVIL,MOTRIN) 200 MG tablet   Oral   Take 200 mg by mouth  every 6 (six) hours as needed.           . Insulin Detemir (LEVEMIR FLEXPEN) 100 UNIT/ML SOPN   Subcutaneous   Inject 40 Units into the skin at bedtime.   5 pen   11   . insulin lispro (HUMALOG KWIKPEN) 100 UNIT/ML SOPN      Inject 12 units a day as advised   5 pen   11   . lisinopril (PRINIVIL,ZESTRIL) 20 MG tablet   Oral   Take 20 mg by mouth daily.         . metFORMIN (GLUCOPHAGE) 500 MG tablet   Oral   Take 2 tablets (1,000 mg total) by mouth daily with supper.   180 tablet   3   . traMADol (ULTRAM) 50 MG tablet   Oral   Take 1  tablet (50 mg total) by mouth every 8 (eight) hours as needed.   30 tablet   0   . amoxicillin-clavulanate (AUGMENTIN) 875-125 MG per tablet   Oral   Take 1 tablet by mouth every 12 (twelve) hours.   20 tablet   0    BP 139/82  Pulse 91  Temp(Src) 98.2 F (36.8 C) (Oral)  Resp 18  SpO2 100% Physical Exam  Nursing note and vitals reviewed. Constitutional: She is oriented to person, place, and time. She appears well-developed and well-nourished. No distress.  HENT:  Head: Normocephalic and atraumatic.  Mouth/Throat: Oropharynx is clear and moist.  Eyes: Conjunctivae and EOM are normal. Pupils are equal, round, and reactive to light.  Neck: Normal range of motion.  Cardiovascular: Normal rate and regular rhythm.   No murmur heard. Pulmonary/Chest: Effort normal and breath sounds normal.  Abdominal: Soft. Bowel sounds are normal. There is no tenderness.  Musculoskeletal: Normal range of motion. She exhibits no edema.  Neurological: She is alert and oriented to person, place, and time. No cranial nerve deficit. She exhibits normal muscle tone. Coordination normal.  Skin: Skin is warm. No rash noted.    ED Course  Procedures (including critical care time) Labs Review Labs Reviewed  COMPREHENSIVE METABOLIC PANEL - Abnormal; Notable for the following:    Glucose, Bld 158 (*)    Albumin 3.4 (*)    All other components within normal limits  URINALYSIS, ROUTINE W REFLEX MICROSCOPIC - Abnormal; Notable for the following:    Specific Gravity, Urine <1.005 (*)    All other components within normal limits  GLUCOSE, CAPILLARY - Abnormal; Notable for the following:    Glucose-Capillary 159 (*)    All other components within normal limits  HEMOGLOBIN AND HEMATOCRIT, BLOOD - Abnormal; Notable for the following:    Hemoglobin 10.8 (*)    HCT 32.2 (*)    All other components within normal limits  CLOSTRIDIUM DIFFICILE BY PCR  LIPASE, BLOOD  TYPE AND SCREEN   Results for orders  placed during the hospital encounter of 01/18/13  COMPREHENSIVE METABOLIC PANEL      Result Value Range   Sodium 137  135 - 145 mEq/L   Potassium 3.7  3.5 - 5.1 mEq/L   Chloride 101  96 - 112 mEq/L   CO2 26  19 - 32 mEq/L   Glucose, Bld 158 (*) 70 - 99 mg/dL   BUN 8  6 - 23 mg/dL   Creatinine, Ser 2.13  0.50 - 1.10 mg/dL   Calcium 9.4  8.4 - 08.6 mg/dL   Total Protein 7.1  6.0 - 8.3 g/dL  Albumin 3.4 (*) 3.5 - 5.2 g/dL   AST 11  0 - 37 U/L   ALT 8  0 - 35 U/L   Alkaline Phosphatase 93  39 - 117 U/L   Total Bilirubin 0.3  0.3 - 1.2 mg/dL   GFR calc non Af Amer >90  >90 mL/min   GFR calc Af Amer >90  >90 mL/min  LIPASE, BLOOD      Result Value Range   Lipase 26  11 - 59 U/L  URINALYSIS, ROUTINE W REFLEX MICROSCOPIC      Result Value Range   Color, Urine YELLOW  YELLOW   APPearance CLEAR  CLEAR   Specific Gravity, Urine <1.005 (*) 1.005 - 1.030   pH 5.0  5.0 - 8.0   Glucose, UA NEGATIVE  NEGATIVE mg/dL   Hgb urine dipstick NEGATIVE  NEGATIVE   Bilirubin Urine NEGATIVE  NEGATIVE   Ketones, ur NEGATIVE  NEGATIVE mg/dL   Protein, ur NEGATIVE  NEGATIVE mg/dL   Urobilinogen, UA 0.2  0.0 - 1.0 mg/dL   Nitrite NEGATIVE  NEGATIVE   Leukocytes, UA NEGATIVE  NEGATIVE  GLUCOSE, CAPILLARY      Result Value Range   Glucose-Capillary 159 (*) 70 - 99 mg/dL   Comment 1 Notify RN     Comment 2 Documented in Chart    HEMOGLOBIN AND HEMATOCRIT, BLOOD      Result Value Range   Hemoglobin 10.8 (*) 12.0 - 15.0 g/dL   HCT 78.4 (*) 69.6 - 29.5 %    Imaging Review Ct Abdomen Pelvis W Contrast  01/18/2013   CLINICAL DATA:  Left lower quadrant pain, rectal bleeding, evaluate for diverticulitis  EXAM: CT ABDOMEN AND PELVIS WITH CONTRAST  TECHNIQUE: Multidetector CT imaging of the abdomen and pelvis was performed using the standard protocol following bolus administration of intravenous contrast.  CONTRAST:  OMNIPAQUE IOHEXOL 300 MG/ML  SOLN  COMPARISON:  None.  FINDINGS: Lung bases are  clear.  Liver, spleen, pancreas, and adrenal glands are within normal limits.  Gallbladder is unremarkable. No intrahepatic or extrahepatic ductal dilatation.  Kidneys are within normal limits.  No hydronephrosis.  No evidence of bowel obstruction. Normal appendix. Extensive colonic diverticulosis.  Focal wall thickening/inflammatory changes involving the hepatic flexure (series 2/ image 39) and descending colon (coronal image 94). Two separate locations of acute diverticulitis are possible. However, neoplasm is not excluded.  Additional mild mucosal thickening along the sigmoid colon is favored to reflect chronic diverticulosis (series 2/image 63), although superimposed acute inflammatory change is possible.  No drainable fluid collection/abscess.  No free air.  No evidence of abdominal aortic aneurysm.  Small retroperitoneal nodes measuring up to 6 mm short axis (series 2/ image 41), within normal limits.  Status post hysterectomy.  Bilateral ovaries are unremarkable.  Bladder is within normal limits.  Mild degenerative changes of the visualized thoracolumbar spine.  IMPRESSION: Focal wall thickening/inflammatory changes involving the hepatic flexure and descending colon, possibly reflecting two separate areas of acute diverticulitis. However, neoplasm is not excluded. Follow-up colonoscopy is suggested.  No drainable fluid collection/abscess.  No free air.  No findings suspicious for metastatic disease.   Electronically Signed   By: Charline Bills M.D.   On: 01/18/2013 14:10    EKG Interpretation   None       MDM   1. GI bleed    Patient with 2 day history of rectal bleeding following a five-day history of a gastroenteritis-type illness. No blood in her bowel  movements then no vomiting with blood then. Patient's had 6-8 bowel movements starting yesterday and today with bright red blood. Seen by her primary care office blood work done and referred him for an outpatient CT. CT scan shows  inflammation in the descending colon and hepatic flexure does raise some concerns for diverticulitis patient has a history of that in the past. No fever no significant elevation in the white blood cell count.  Will admit to MedSurg for observation hemoglobin and hematocrit are fine currently vital signs are normal. Patient will have CD if checked and started on Flagyl and Cipro for the possibility of developing diverticulitis. Hospitalist team will in it.    Shelda Jakes, MD 01/18/13 [id number]

## 2013-01-18 NOTE — Progress Notes (Signed)
Pre visit review using our clinic review tool, if applicable. No additional management support is needed unless otherwise documented below in the visit note. 

## 2013-01-18 NOTE — Progress Notes (Signed)
Chief Complaint  Patient presents with  . Abdominal Pain    lower abd  . Diarrhea    saw padonda-xray done-hx of diverticulitis    HPI: Patient comes in today for SDA for   problem evaluation. pcp dr Tawanna Cooler; seen last week PC for back and abd pain neg kub given treated low back pain Cramping abd pain  Lower abd   Given tramadol.  Caused drowsiness. His only taking 2 Has been taking ibuprofen and Aleve in addition to her aspirin last night for the pain She's now having increased Worse in lower abdominal cramping  Feels like had fever sweats and diarrhea ?  But formed stool Now having bright red bleeding ? Hemorrhoids.   possible Mixed in with stool . The last 2 stools had bright red blood mixed in the toilet water but no specific clots no melena Last colon about 6-8 years ago.  bms prev nl but since 1 am had blood with wiping.  Since then has had blood in it.  Hx of stones and diverticulitis  Has DM date controlled hasn't really changed recently. ROS: See pertinent positives and negatives per HPI. No current chest pain shortness of breath syncope unusual bleeding except as above. No UTI symptoms. Back pain is better  Past Medical History  Diagnosis Date  . Diabetes mellitus type II   . GERD (gastroesophageal reflux disease)   . Hypertension   . History of lithotripsy     x3 for kidney stones    Family History  Problem Relation Age of Onset  . Cancer Mother     throat  . Diabetes Mother   . Hypertension Mother   . Alcohol abuse Father   . Hypertension Father   . Multiple sclerosis Sister   . Multiple sclerosis Sister     History   Social History  . Marital Status: Divorced    Spouse Name: N/A    Number of Children: N/A  . Years of Education: N/A   Social History Main Topics  . Smoking status: Former Smoker -- 1.5 years    Quit date: 01/28/1985  . Smokeless tobacco: None  . Alcohol Use: None  . Drug Use: None  . Sexual Activity: None   Other Topics Concern  .  None   Social History Narrative  . None    Outpatient Encounter Prescriptions as of 01/18/2013  Medication Sig  . amLODipine (NORVASC) 5 MG tablet Take 5 mg by mouth daily.   Marland Kitchen aspirin 81 MG tablet Take 81 mg by mouth daily.    Marland Kitchen glucose blood (ONE TOUCH TEST STRIPS) test strip Use 3x a day as advised  . ibuprofen (ADVIL,MOTRIN) 200 MG tablet Take 200 mg by mouth every 6 (six) hours as needed.    . Insulin Detemir (LEVEMIR FLEXPEN) 100 UNIT/ML SOPN Inject 40 Units into the skin at bedtime.  . insulin lispro (HUMALOG KWIKPEN) 100 UNIT/ML SOPN Inject 12 units a day as advised  . Insulin Pen Needle (PEN NEEDLES) 31G X 6 MM MISC Use the pen 3x a day  . INSULIN SYRINGE .5CC/29G (MONOJECT INS SYR .5CC/29G) 29G X 1/2" 0.5 ML MISC 1 each by Does not apply route as directed.  Marland Kitchen lisinopril (PRINIVIL,ZESTRIL) 20 MG tablet TAKE TWO TABLETS BY MOUTH EVERY DAY  . metFORMIN (GLUCOPHAGE) 500 MG tablet Take 2 tablets (1,000 mg total) by mouth daily with supper.  . ONE TOUCH LANCETS MISC by Does not apply route daily.    . Pediatric Multivit-Minerals-C (  RA GUMMY VITAMINS & MINERALS PO) Take 2 each by mouth daily.  . traMADol (ULTRAM) 50 MG tablet Take 1 tablet (50 mg total) by mouth every 8 (eight) hours as needed.  Marland Kitchen amoxicillin-clavulanate (AUGMENTIN) 875-125 MG per tablet Take 1 tablet by mouth every 12 (twelve) hours.  Marland Kitchen b complex vitamins tablet Take 1 tablet by mouth daily.  . [DISCONTINUED] albuterol (PROVENTIL HFA;VENTOLIN HFA) 108 (90 BASE) MCG/ACT inhaler Inhale 2 puffs into the lungs every 6 (six) hours as needed for wheezing.  . [DISCONTINUED] b complex vitamins tablet Take 1 tablet by mouth daily.   . [DISCONTINUED] Cholecalciferol (VITAMIN D3) 10000 UNITS capsule Take 10,000 Units by mouth daily.    . [DISCONTINUED] fexofenadine (ALLEGRA) 180 MG tablet Take 180 mg by mouth daily.    . [DISCONTINUED] HYDROcodone-homatropine (HYCODAN) 5-1.5 MG/5ML syrup     EXAM:  BP 146/90  Pulse 84   Temp(Src) 98 F (36.7 C)  Resp 16  Ht 5\' 2"  (1.575 m)  Wt 134 lb (60.782 kg)  BMI 24.50 kg/m2  Body mass index is 24.5 kg/(m^2).  GENERAL: vitals reviewed and listed above, alert, oriented, appears well hydrated and in no acute distress She is nontoxic ambulatory HEENT: atraumatic, conjunctiva  clear, no obvious abnormalities on inspection of external nose and ears  NECK: no obvious masses on inspection palpation no adenopathy LUNGS: clear to auscultation bilaterally, no wheezes, rales or rhonchi, good air movement CV: HRRR, no clubbing cyanosis or  peripheral edema nl cap refill  Abdomen soft without organomegaly guarding or rebound she does have bowel sounds not in the left lower Corder it tenderness area suprapubic but no mass effect or rebound. No CVA pain. Negative psoas sign. Anal area 2 large hemorrhoidal tags not actively bleeding rectal exam no obvious mass but medium red mucus blood on glove. MS: moves all extremities without noticeable focal  abnormality PSYCH: pleasant and cooperative, no obvious depression or anxiety  ASSESSMENT AND PLAN:  Discussed the following assessment and plan:  Bilateral lower abdominal cramping - Plan: b complex vitamins tablet, Basic metabolic panel, CBC with Differential  Rectal bleeding - Plan: b complex vitamins tablet, Basic metabolic panel, CBC with Differential  IDDM Abdominal pelvic CT ASAP begin antibiotics stat GI referral seek emergent care if progressing symptoms. -Patient advised to return or notify health care team  if symptoms worsen or persist or new concerns arise.  Patient Instructions  Concern about diverticulitis  Other causes of bleeding.  Stop Advil/ aleve type products  And aspirin. Lab today and abd pelvic ct asap.  Will get gi referral asap also. Begin antibiotic  If not improvising or continued bleeding  Seek  to emergency care .    Rectal Bleeding Rectal bleeding is when blood passes out of the anus. It is  usually a sign that something is wrong. It may not be serious, but it should always be evaluated. Rectal bleeding may present as bright red blood or extremely dark stools. The color may range from dark red or maroon to black (like tar). It is important that the cause of rectal bleeding be identified so treatment can be started and the problem corrected. CAUSES   Hemorrhoids. These are enlarged (dilated) blood vessels or veins in the anal or rectal area.  Fistulas. Theseare abnormal, burrowing channels that usually run from inside the rectum to the skin around the anus. They can bleed.  Anal fissures. This is a tear in the tissue of the anus. Bleeding occurs with bowel  movements.  Diverticulosis. This is a condition in which pockets or sacs project from the bowel wall. Occasionally, the sacs can bleed.  Diverticulitis. Thisis an infection involving diverticulosis of the colon.  Proctitis and colitis. These are conditions in which the rectum, colon, or both, can become inflamed and pitted (ulcerated).  Polyps and cancer. Polyps are non-cancerous (benign) growths in the colon that may bleed. Certain types of polyps turn into cancer.  Protrusion of the rectum. Part of the rectum can project from the anus and bleed.  Certain medicines.  Intestinal infections.  Blood vessel abnormalities. HOME CARE INSTRUCTIONS  Eat a high-fiber diet to keep your stool soft.  Limit activity.  Drink enough fluids to keep your urine clear or pale yellow.  Warm baths may be useful to soothe rectal pain.  Follow up with your caregiver as directed. SEEK IMMEDIATE MEDICAL CARE IF:  You develop increased bleeding.  You have black or dark red stools.  You vomit blood or material that looks like coffee grounds.  You have abdominal pain or tenderness.  You have a fever.  You feel weak, nauseous, or you faint.  You have severe rectal pain or you are unable to have a bowel movement. MAKE SURE  YOU:  Understand these instructions.  Will watch your condition.  Will get help right away if you are not doing well or get worse. Document Released: 07/04/2001 Document Revised: 04/06/2011 Document Reviewed: 06/29/2010 [facility] Patient Information 2014 [place], [place].      Neta Mends. Panosh M.D.

## 2013-01-18 NOTE — Progress Notes (Signed)
Received report from ED. Admitting MD at bedside now.

## 2013-01-18 NOTE — ED Notes (Addendum)
Pt reports lower abd pain x 2 weeks and having rectal bleeding since this am. Having n/v. Went to pcp this am, had blood work done, was sent here for ct scan and further eval.

## 2013-01-18 NOTE — Patient Instructions (Addendum)
Concern about diverticulitis  Other causes of bleeding.  Stop Advil/ aleve type products  And aspirin. Lab today and abd pelvic ct asap.  Will get gi referral asap also. Begin antibiotic  If not improvising or continued bleeding  Seek  to emergency care .    Rectal Bleeding Rectal bleeding is when blood passes out of the anus. It is usually a sign that something is wrong. It may not be serious, but it should always be evaluated. Rectal bleeding may present as bright red blood or extremely dark stools. The color may range from dark red or maroon to black (like tar). It is important that the cause of rectal bleeding be identified so treatment can be started and the problem corrected. CAUSES   Hemorrhoids. These are enlarged (dilated) blood vessels or veins in the anal or rectal area.  Fistulas. Theseare abnormal, burrowing channels that usually run from inside the rectum to the skin around the anus. They can bleed.  Anal fissures. This is a tear in the tissue of the anus. Bleeding occurs with bowel movements.  Diverticulosis. This is a condition in which pockets or sacs project from the bowel wall. Occasionally, the sacs can bleed.  Diverticulitis. Thisis an infection involving diverticulosis of the colon.  Proctitis and colitis. These are conditions in which the rectum, colon, or both, can become inflamed and pitted (ulcerated).  Polyps and cancer. Polyps are non-cancerous (benign) growths in the colon that may bleed. Certain types of polyps turn into cancer.  Protrusion of the rectum. Part of the rectum can project from the anus and bleed.  Certain medicines.  Intestinal infections.  Blood vessel abnormalities. HOME CARE INSTRUCTIONS  Eat a high-fiber diet to keep your stool soft.  Limit activity.  Drink enough fluids to keep your urine clear or pale yellow.  Warm baths may be useful to soothe rectal pain.  Follow up with your caregiver as directed. SEEK IMMEDIATE  MEDICAL CARE IF:  You develop increased bleeding.  You have black or dark red stools.  You vomit blood or material that looks like coffee grounds.  You have abdominal pain or tenderness.  You have a fever.  You feel weak, nauseous, or you faint.  You have severe rectal pain or you are unable to have a bowel movement. MAKE SURE YOU:  Understand these instructions.  Will watch your condition.  Will get help right away if you are not doing well or get worse. Document Released: 07/04/2001 Document Revised: 04/06/2011 Document Reviewed: 06/29/2010 [facility] Patient Information 2014 [place], [place].

## 2013-01-18 NOTE — Progress Notes (Signed)
[facility] History and Physical  Katrina Garrison ZOX:[id number] DOB: 07/30/1940 DOA: 01/18/2013  Referring physician: Dr. Deretha Emory PCP: Evette Georges, MD   Chief Complaint: Abdominal pain, nausea, vomiting, hematochezia.  HPI: Katrina Garrison is a 72 y.o. female with a past medical history significant for hypertension, diabetes, GERD and diverticulosis; came to the hospital complaining of abdominal pain, nausea/vomiting and hematochezia. Patient reports her symptoms been present for the last 2 to days prior to admission and worsening. Patient is now on able to keep things down probably in a stable and baby week and tired. Patient denies fever, chills, hematemesis, headache, chest pain, shortness of breath, cough or any other acute complaints. Patient reports her abdominal pain is cramping in nature and localized in her lower quadrants (left more than right). In the ED patient was found with mild leukocytosis, CT of the abdomen demonstrated focal inflammation in her left splenic And descending colon suggesting diverticulitis. [facility] to [facility] the patient for further evaluation and treatment.  Review of Systems:  Negative except as otherwise mentioned on history of present illness  Past Medical History  Diagnosis Date  . Diabetes mellitus type II   . GERD (gastroesophageal reflux disease)   . Hypertension   . History of lithotripsy     x3 for kidney stones   Past Surgical History  Procedure Laterality Date  . Abdominal hysterectomy    . Childbirth x 2     Social History:  reports that she quit smoking about 27 years ago. She does not have any smokeless tobacco history on file. Her alcohol and drug histories are not on file.  Allergies  Allergen Reactions  . Metformin And Related Diarrhea  . Sulfamethoxazole     REACTION: "comatose"    Family History  Problem Relation Age of Onset  . Cancer Mother     throat  . Diabetes Mother   .  Hypertension Mother   . Alcohol abuse Father   . Hypertension Father   . Multiple sclerosis Sister   . Multiple sclerosis Sister      Prior to Admission medications   Medication Sig Start Date End Date Taking? Authorizing Provider  amLODipine (NORVASC) 5 MG tablet Take 5 mg by mouth daily.  05/25/12  Yes Historical Provider, MD  ibuprofen (ADVIL,MOTRIN) 200 MG tablet Take 200 mg by mouth every 6 (six) hours as needed.     Yes Historical Provider, MD  Insulin Detemir (LEVEMIR FLEXPEN) 100 UNIT/ML SOPN Inject 40 Units into the skin at bedtime. 12/16/12  Yes Carlus Pavlov, MD  insulin lispro (HUMALOG KWIKPEN) 100 UNIT/ML SOPN Inject 12 units a day as advised 11/14/12  Yes Carlus Pavlov, MD  lisinopril (PRINIVIL,ZESTRIL) 20 MG tablet Take 20 mg by mouth daily.   Yes Historical Provider, MD  metFORMIN (GLUCOPHAGE) 500 MG tablet Take 2 tablets (1,000 mg total) by mouth daily with supper. 12/16/12  Yes Carlus Pavlov, MD  traMADol (ULTRAM) 50 MG tablet Take 1 tablet (50 mg total) by mouth every 8 (eight) hours as needed. 01/11/13  Yes Baker Pierini, FNP  amoxicillin-clavulanate (AUGMENTIN) 875-125 MG per tablet Take 1 tablet by mouth every 12 (twelve) hours. 01/18/13   Madelin Headings, MD   Physical Exam: Filed Vitals:   01/18/13 1808  BP: 116/62  Pulse: 61  Temp: 97.5 F (36.4 C)  Resp: 16    BP 116/62  Pulse 61  Temp(Src) 97.5 F (36.4 C) (Oral)  Resp 16  Ht 5\' 2"  (1.575  m)  Wt 59.603 kg (131 lb 6.4 oz)  BMI 24.03 kg/m2  SpO2 96%  General:  Appears calm and comfortable, afebrile and competitive to examination. Eyes: PERRL, normal lids, irises & conjunctiva ENT: grossly normal hearing, mild dryness of her oral mucosal, no erythema or exudate inside her mouth. No drainage out of her ears or nostrils Neck: no LAD, masses or thyromegaly Cardiovascular: RRR, no m/r/g. No LE edema. Telemetry: SR, no arrhythmias  Respiratory: CTA bilaterally, no w/r/r. Normal respiratory  effort. Abdomen: soft, ntnd Skin: no rash or induration seen on limited exam Musculoskeletal: grossly normal tone BUE/BLE Psychiatric: grossly normal mood and affect, speech fluent and appropriate Neurologic: grossly non-focal.          Labs on Admission:  Basic Metabolic Panel:  Recent Labs Lab 01/18/13 0957 01/18/13 1354  NA 142 137  K 4.6 3.7  CL 106 101  CO2 28 26  GLUCOSE 151* 158*  BUN 9 8  CREATININE 0.5 0.53  CALCIUM 9.5 9.4   Liver Function Tests:  Recent Labs Lab 01/18/13 1354  AST 11  ALT 8  ALKPHOS 93  BILITOT 0.3  PROT 7.1  ALBUMIN 3.4*    Recent Labs Lab 01/18/13 1354  LIPASE 26   CBC:  Recent Labs Lab 01/18/13 0957 01/18/13 1603  WBC 10.7*  --   NEUTROABS 8.4*  --   HGB 11.6* 10.8*  HCT 35.9* 32.2*  MCV 81.6  --   PLT 294.0  --    CBG:  Recent Labs Lab 01/18/13 1508  GLUCAP 159*    Radiological Exams on Admission: Ct Abdomen Pelvis W Contrast  01/18/2013   CLINICAL DATA:  Left lower quadrant pain, rectal bleeding, evaluate for diverticulitis  EXAM: CT ABDOMEN AND PELVIS WITH CONTRAST  TECHNIQUE: Multidetector CT imaging of the abdomen and pelvis was performed using the standard protocol following bolus administration of intravenous contrast.  CONTRAST:  OMNIPAQUE IOHEXOL 300 MG/ML  SOLN  COMPARISON:  None.  FINDINGS: Lung bases are clear.  Liver, spleen, pancreas, and adrenal glands are within normal limits.  Gallbladder is unremarkable. No intrahepatic or extrahepatic ductal dilatation.  Kidneys are within normal limits.  No hydronephrosis.  No evidence of bowel obstruction. Normal appendix. Extensive colonic diverticulosis.  Focal wall thickening/inflammatory changes involving the hepatic flexure (series 2/ image 39) and descending colon (coronal image 94). Two separate locations of acute diverticulitis are possible. However, neoplasm is not excluded.  Additional mild mucosal thickening along the sigmoid colon is favored to  reflect chronic diverticulosis (series 2/image 63), although superimposed acute inflammatory change is possible.  No drainable fluid collection/abscess.  No free air.  No evidence of abdominal aortic aneurysm.  Small retroperitoneal nodes measuring up to 6 mm short axis (series 2/ image 41), within normal limits.  Status post hysterectomy.  Bilateral ovaries are unremarkable.  Bladder is within normal limits.  Mild degenerative changes of the visualized thoracolumbar spine.  IMPRESSION: Focal wall thickening/inflammatory changes involving the hepatic flexure and descending colon, possibly reflecting two separate areas of acute diverticulitis. However, neoplasm is not excluded. Follow-up colonoscopy is suggested.  No drainable fluid collection/abscess.  No free air.  No findings suspicious for metastatic disease.   Electronically Signed   By: Charline Bills M.D.   On: 01/18/2013 14:10   Assessment/Plan 1-abdominal pain, nausea/vomiting and rectal bleeding: Presumed to be secondary to infectious diverticulitis. -Patient will be admitted to MedSurg -Will track hemoglobin trend -Fluid resuscitation and supportive care -IV ciprofloxacin and Flagyl -  Will check C. difficile by PCR -Contact precautions while ruling out C. Difficile -If patient's bleeding worsen will consult GI for colonoscopy; otherwise patient can have a colonoscopy in about 4 weeks after discharge. Marland Kitchen 2-IDDM: Will check hemoglobin A1c and continue Levemir and a sliding scale insulin. -Will hold metformin while inpatient.  3-HYPERTENSION: Continue current antihypertensive regimen. Bloodpressure stable and well controlled.    4-GERD: Will use famotidine while ruling out C. difficile. If C. difficile negative will resume the use of PPI.  5-GI bleed: Hematochezia in nature most likely do to colitis or diverticulosis. -Treatment as mentioned above an outpatient colonoscopy unless patient deteriorates and required inpatient GI  assessment.  DVT: SCDs   Code Status: Full Family Communication: daughters at bedside Disposition Plan: LOS > 2 midnights, med-surg, inpatient  Time spent: 50 minutes  Wyley Hack [facility] Pager [phone]

## 2013-01-18 NOTE — Progress Notes (Signed)
Attempted to call report x1. RN in another room with a pt.

## 2013-01-18 NOTE — ED Notes (Signed)
Admitting MD at bedside.

## 2013-01-19 DIAGNOSIS — R109 Unspecified abdominal pain: Secondary | ICD-10-CM

## 2013-01-19 LAB — BASIC METABOLIC PANEL WITH GFR
BUN: 11 mg/dL (ref 6–23)
CO2: 26 meq/L (ref 19–32)
Calcium: 8.5 mg/dL (ref 8.4–10.5)
Chloride: 105 meq/L (ref 96–112)
Creatinine, Ser: 0.67 mg/dL (ref 0.50–1.10)
GFR calc Af Amer: >90 mL/min
GFR calc non Af Amer: 86 mL/min — ABNORMAL LOW
Glucose, Bld: 226 mg/dL — ABNORMAL HIGH (ref 70–99)
Potassium: 4.8 meq/L (ref 3.5–5.1)
Sodium: 139 meq/L (ref 135–145)

## 2013-01-19 LAB — CBC
HCT: 26.1 % — ABNORMAL LOW (ref 36.0–46.0)
HCT: 26.3 % — ABNORMAL LOW (ref 36.0–46.0)
Hemoglobin: 9 g/dL — ABNORMAL LOW (ref 12.0–15.0)
MCH: 27.8 pg (ref 26.0–34.0)
MCHC: 34.2 g/dL (ref 30.0–36.0)
MCV: 81.2 fL (ref 78.0–100.0)
MCV: 82.1 fL (ref 78.0–100.0)
RBC: 3.18 MIL/uL — ABNORMAL LOW (ref 3.87–5.11)
RDW: 13.9 % (ref 11.5–15.5)
RDW: 13.9 % (ref 11.5–15.5)
WBC: 11.6 10*3/uL — ABNORMAL HIGH (ref 4.0–10.5)

## 2013-01-19 LAB — GLUCOSE, CAPILLARY
Glucose-Capillary: 169 mg/dL — ABNORMAL HIGH (ref 70–99)
Glucose-Capillary: 147 mg/dL — ABNORMAL HIGH (ref 70–99)
Glucose-Capillary: 209 mg/dL — ABNORMAL HIGH (ref 70–99)

## 2013-01-19 LAB — CLOSTRIDIUM DIFFICILE BY PCR: Toxigenic C. Difficile by PCR: NEGATIVE

## 2013-01-19 MED ORDER — PANTOPRAZOLE SODIUM 40 MG PO TBEC
40.0000 mg | DELAYED_RELEASE_TABLET | Freq: Every day | ORAL | Status: DC
  Administered 2013-01-19 – 2013-01-22 (×4): 40 mg via ORAL
  Filled 2013-01-19 (×4): qty 1

## 2013-01-19 NOTE — Progress Notes (Signed)
[facility] PROGRESS NOTE  Clare Fennimore MWU:[id number] DOB: September 22, 1940 DOA: 01/18/2013 PCP: Evette Georges, MD  HPI/Subjective: No bowel movements since yesterday, yesterday had bloody bowel movement with thick clots.  Interval history: 72 year old African American female with history of diabetes and hypertension came in to the hospital because of nausea, vomiting and diarrhea. Patient said her symptoms she therefore several days but for the past 2 days she started to have bloody bowel movements. She went to her primary care physician who sent her to the emergency department for further evaluation. Hemoglobin on admission was 11.6.  Assessment/Plan: Active Problems:   IDDM   HYPERTENSION   GERD   GI bleed   Abdominal pain, left lower quadrant   Nausea with vomiting   Abdominal  pain, other specified site   Acute diverticulitis -Patient presented with nausea, vomiting, diarrhea and rectal bleeding. -CT scan done and showed findings consistent with acute diverticulitis/colitis. -Patient started on ciprofloxacin and metronidazole improving since yesterday. -C. difficile is negative.  Rectal bleeding -Started 2 days ago after the severe diarrhea. -Likely secondary to the diarrhea/colitis and sloughing of the mucosa. -Had colonoscopy in February of 2010 by [facility], GI bleeding continue we will consult them.  Anemia -Hemoglobin on admission is 11.6, hemoglobin is 9.0 today, he was delusional she is contributing. -Check hemoglobin a.m. if further decline in the hemoglobin GI consultation will be warranted.  Hypertension -Her blood pressure medications continued, blood pressure stable.  GERD -Patient was on PPI, held because suspicion of C. difficile, I will restart it.  Code Status: Full code Family Communication: Plan discussed with the patient. Disposition Plan: Remains  inpatient   Consultants:  None  Procedures:  None  Antibiotics:  Cipro/Metro   Objective: Filed Vitals:   01/19/13 0938  BP: 143/73  Pulse:   Temp:   Resp:     Intake/Output Summary (Last 24 hours) at 01/19/13 1125 Last data filed at 01/19/13 0600  Gross per 24 hour  Intake    240 ml  Output      0 ml  Net    240 ml   Filed Weights   01/18/13 1808  Weight: 59.603 kg (131 lb 6.4 oz)    Exam: General: Alert and awake, oriented x3, not in any acute distress. HEENT: anicteric sclera, pupils reactive to light and accommodation, EOMI CVS: S1-S2 clear, no murmur rubs or gallops Chest: clear to auscultation bilaterally, no wheezing, rales or rhonchi Abdomen: soft nontender, nondistended, normal bowel sounds, no organomegaly Extremities: no cyanosis, clubbing or edema noted bilaterally Neuro: Cranial nerves II-XII intact, no focal neurological deficits  Data Reviewed: Basic Metabolic Panel:  Recent Labs Lab 01/18/13 0957 01/18/13 1354 01/19/13 0350  NA 142 137 139  K 4.6 3.7 4.8  CL 106 101 105  CO2 28 26 26   GLUCOSE 151* 158* 226*  BUN 9 8 11   CREATININE 0.5 0.53 0.67  CALCIUM 9.5 9.4 8.5   Liver Function Tests:  Recent Labs Lab 01/18/13 1354  AST 11  ALT 8  ALKPHOS 93  BILITOT 0.3  PROT 7.1  ALBUMIN 3.4*    Recent Labs Lab 01/18/13 1354  LIPASE 26   No results found for this basename: AMMONIA,  in the last 168 hours CBC:  Recent Labs Lab 01/18/13 0957 01/18/13 1603 01/19/13 0035  WBC 10.7*  --  15.6*  NEUTROABS 8.4*  --   --   HGB 11.6* 10.8* 9.0*  HCT 35.9* 32.2* 26.3*  MCV 81.6  --  81.2  PLT 294.0  --  233   Cardiac Enzymes: No results found for this basename: CKTOTAL, CKMB, CKMBINDEX, TROPONINI,  in the last 168 hours BNP (last 3 results) No results found for this basename: PROBNP,  in the last 8760 hours CBG:  Recent Labs Lab 01/18/13 1508 01/18/13 2133 01/19/13 0751  GLUCAP 159* 355* 169*    Micro Recent  Results (from the past 240 hour(s))  CLOSTRIDIUM DIFFICILE BY PCR     Status: None   Collection Time    01/18/13  9:54 PM      Result Value Range Status   C difficile by pcr NEGATIVE  NEGATIVE Final     Studies: Ct Abdomen Pelvis W Contrast  01/18/2013   CLINICAL DATA:  Left lower quadrant pain, rectal bleeding, evaluate for diverticulitis  EXAM: CT ABDOMEN AND PELVIS WITH CONTRAST  TECHNIQUE: Multidetector CT imaging of the abdomen and pelvis was performed using the standard protocol following bolus administration of intravenous contrast.  CONTRAST:  OMNIPAQUE IOHEXOL 300 MG/ML  SOLN  COMPARISON:  None.  FINDINGS: Lung bases are clear.  Liver, spleen, pancreas, and adrenal glands are within normal limits.  Gallbladder is unremarkable. No intrahepatic or extrahepatic ductal dilatation.  Kidneys are within normal limits.  No hydronephrosis.  No evidence of bowel obstruction. Normal appendix. Extensive colonic diverticulosis.  Focal wall thickening/inflammatory changes involving the hepatic flexure (series 2/ image 39) and descending colon (coronal image 94). Two separate locations of acute diverticulitis are possible. However, neoplasm is not excluded.  Additional mild mucosal thickening along the sigmoid colon is favored to reflect chronic diverticulosis (series 2/image 63), although superimposed acute inflammatory change is possible.  No drainable fluid collection/abscess.  No free air.  No evidence of abdominal aortic aneurysm.  Small retroperitoneal nodes measuring up to 6 mm short axis (series 2/ image 41), within normal limits.  Status post hysterectomy.  Bilateral ovaries are unremarkable.  Bladder is within normal limits.  Mild degenerative changes of the visualized thoracolumbar spine.  IMPRESSION: Focal wall thickening/inflammatory changes involving the hepatic flexure and descending colon, possibly reflecting two separate areas of acute diverticulitis. However, neoplasm is not excluded.  Follow-up colonoscopy is suggested.  No drainable fluid collection/abscess.  No free air.  No findings suspicious for metastatic disease.   Electronically Signed   By: Charline Bills M.D.   On: 01/18/2013 14:10    Scheduled Meds: . amLODipine  5 mg Oral Daily  . ciprofloxacin  400 mg Intravenous Q12H  . famotidine  20 mg Oral Daily  . insulin aspart  0-9 Units Subcutaneous TID WC  . insulin detemir  15 Units Subcutaneous QHS  . lisinopril  20 mg Oral Daily  . metronidazole  500 mg Intravenous Q8H   Continuous Infusions: . sodium chloride 75 mL/hr (01/19/13 0941)       Time spent: 35 minutes    [facility]  [facility] Pager [phone] If 7PM-7AM, please contact night-coverage at [email or web address], password [facility] 01/19/2013, 11:25 AM  LOS: 1 day

## 2013-01-20 ENCOUNTER — Encounter (HOSPITAL_COMMUNITY): Payer: Self-pay | Admitting: Physician Assistant

## 2013-01-20 DIAGNOSIS — K559 Vascular disorder of intestine, unspecified: Secondary | ICD-10-CM | POA: Diagnosis present

## 2013-01-20 DIAGNOSIS — E109 Type 1 diabetes mellitus without complications: Secondary | ICD-10-CM

## 2013-01-20 DIAGNOSIS — K625 Hemorrhage of anus and rectum: Secondary | ICD-10-CM

## 2013-01-20 LAB — BASIC METABOLIC PANEL
CO2: 26 mEq/L (ref 19–32)
Chloride: 113 mEq/L — ABNORMAL HIGH (ref 96–112)
Creatinine, Ser: 0.6 mg/dL (ref 0.50–1.10)
Glucose, Bld: 111 mg/dL — ABNORMAL HIGH (ref 70–99)
Sodium: 147 mEq/L — ABNORMAL HIGH (ref 135–145)

## 2013-01-20 LAB — GLUCOSE, CAPILLARY
Glucose-Capillary: 153 mg/dL — ABNORMAL HIGH (ref 70–99)
Glucose-Capillary: 123 mg/dL — ABNORMAL HIGH (ref 70–99)
Glucose-Capillary: 227 mg/dL — ABNORMAL HIGH (ref 70–99)
Glucose-Capillary: 237 mg/dL — ABNORMAL HIGH (ref 70–99)

## 2013-01-20 LAB — CBC
Hemoglobin: 8 g/dL — ABNORMAL LOW (ref 12.0–15.0)
MCH: 26.8 pg (ref 26.0–34.0)
MCV: 82.6 fL (ref 78.0–100.0)
Platelets: 223 10*3/uL (ref 150–400)
RBC: 2.98 MIL/uL — ABNORMAL LOW (ref 3.87–5.11)
WBC: 7 10*3/uL (ref 4.0–10.5)

## 2013-01-20 MED ORDER — INSULIN DETEMIR 100 UNIT/ML ~~LOC~~ SOLN
20.0000 [IU] | Freq: Every day | SUBCUTANEOUS | Status: DC
  Administered 2013-01-20 – 2013-01-21 (×2): 20 [IU] via SUBCUTANEOUS
  Filled 2013-01-20 (×3): qty 0.2

## 2013-01-20 MED ORDER — SODIUM CHLORIDE 0.45 % IV SOLN
INTRAVENOUS | Status: DC
  Administered 2013-01-20 (×2): via INTRAVENOUS

## 2013-01-20 MED ORDER — SODIUM CHLORIDE 0.45 % IV SOLN
INTRAVENOUS | Status: DC
  Administered 2013-01-20 – 2013-01-21 (×2): via INTRAVENOUS

## 2013-01-20 MED ORDER — LORAZEPAM 0.5 MG PO TABS
0.5000 mg | ORAL_TABLET | Freq: Once | ORAL | Status: AC
  Administered 2013-01-20: 0.5 mg via ORAL
  Filled 2013-01-20: qty 1

## 2013-01-20 MED ORDER — HYDROCORTISONE ACETATE 25 MG RE SUPP
25.0000 mg | Freq: Two times a day (BID) | RECTAL | Status: DC
  Administered 2013-01-20 (×2): 25 mg via RECTAL
  Filled 2013-01-20 (×6): qty 1

## 2013-01-20 MED ORDER — INSULIN ASPART 100 UNIT/ML ~~LOC~~ SOLN
0.0000 [IU] | Freq: Three times a day (TID) | SUBCUTANEOUS | Status: DC
  Administered 2013-01-20: 5 [IU] via SUBCUTANEOUS
  Administered 2013-01-20: 2 [IU] via SUBCUTANEOUS
  Administered 2013-01-20 – 2013-01-21 (×2): 3 [IU] via SUBCUTANEOUS
  Administered 2013-01-21: 5 [IU] via SUBCUTANEOUS
  Administered 2013-01-21: 8 [IU] via SUBCUTANEOUS
  Administered 2013-01-22 (×2): 3 [IU] via SUBCUTANEOUS

## 2013-01-20 NOTE — Progress Notes (Signed)
[facility] PROGRESS NOTE  Dru Laurel NFA:[id number] DOB: 08-07-1940 DOA: 01/18/2013 PCP: Evette Georges, MD  HPI/Subjective: Patient feeling improved.  Daughters at bedside with many questions.  GI consultation requested by family.  Interval history: 72 year old African American female with history of diabetes and hypertension came in to the hospital because of nausea, vomiting and diarrhea. Patient said she had her symptoms for several days but for the past 2 days she started to have bloody bowel movements. She went to her primary care physician who sent her to the emergency department for further evaluation. Hemoglobin on admission was 11.6.  Assessment/Plan: Active Problems:   IDDM   HYPERTENSION   GERD   GI bleed   Abdominal pain, left lower quadrant   Nausea with vomiting   Abdominal  pain, other specified site   Ischemic colitis   Ischemic Colitis -Appreciate [facility] evaluation and recommendations. -Patient presented with nausea, vomiting, diarrhea and rectal bleeding.  These symptoms are improved.  Diet slowly being advanced. -CT scan done and showed findings consistent with acute colitis. -ciprofloxacin and metronidazole started on 12/24.  Will continue for 10 days per GI. -C. difficile is negative.  Rectal bleeding.  Acute Blood Loss Anemia. -Secondary to Ischemic colitis. -Hgb dropped from 11.6 - 8.0 this admit.  If she continues to drop into the 7s will likely transfuse.  Hypernatremia -Mild (sodium 147) -Will continue IVF with 0.45 NS at 75 ml / hour.  Hypertension -Her blood pressure medications continued, blood pressure moderately controlled. -Will consider increasing amlodipine to 10 mg daily.  DM -Hgb A1C is 11.2 -Patient sees Endocrinologist Foye Spurling and is on Metformin, Humalog and Levemir. -Patient would like to D/C metformin on discharge due to diarrhea and continue increased dose of insulin. -increased levemir to 20 units  and SSI to moderate on 12/26.  GERD -Patient was on PPI, held because suspicion of C. Difficile.  It has been restarted.  Code Status: Full code Family Communication: Plan discussed with the patient and her two daughters at length at bedside. Disposition Plan: Remains inpatient   Consultants:  [facility]  Procedures:  None  Antibiotics:  Cipro/Metro   Objective: Filed Vitals:   01/20/13 0637  BP: 163/76  Pulse: 68  Temp: 99.1 F (37.3 C)  Resp: 16    Intake/Output Summary (Last 24 hours) at 01/20/13 1252 Last data filed at 01/20/13 1100  Gross per 24 hour  Intake   3448 ml  Output      0 ml  Net   3448 ml   Filed Weights   01/18/13 1808  Weight: 59.603 kg (131 lb 6.4 oz)    Exam: General: Alert and awake, oriented x3, not in any acute distress. Very Pleasant. HEENT: anicteric sclera, pupils reactive to light and accommodation, EOMI CVS: S1-S2 clear, no murmur rubs or gallops Chest: clear to auscultation bilaterally, no wheezing, rales or rhonchi Abdomen: soft nontender, nondistended, normal bowel sounds, no organomegaly Extremities: no cyanosis, clubbing or edema noted bilaterally Neuro: Cranial nerves II-XII intact, no focal neurological deficits  Data Reviewed: Basic Metabolic Panel:  Recent Labs Lab 01/18/13 0957 01/18/13 1354 01/19/13 0350 01/20/13 0518  NA 142 137 139 147*  K 4.6 3.7 4.8 3.5  CL 106 101 105 113*  CO2 28 26 26 26   GLUCOSE 151* 158* 226* 111*  BUN 9 8 11  5*  CREATININE 0.5 0.53 0.67 0.60  CALCIUM 9.5 9.4 8.5 8.4   Liver Function Tests:  Recent Labs Lab  01/18/13 1354  AST 11  ALT 8  ALKPHOS 93  BILITOT 0.3  PROT 7.1  ALBUMIN 3.4*    Recent Labs Lab 01/18/13 1354  LIPASE 26   CBC:  Recent Labs Lab 01/18/13 0957 01/18/13 1603 01/19/13 0035 01/19/13 1614 01/20/13 0518  WBC 10.7*  --  15.6* 11.6* 7.0  NEUTROABS 8.4*  --   --   --   --   HGB 11.6* 10.8* 9.0* 8.6* 8.0*  HCT 35.9* 32.2* 26.3* 26.1* 24.6*   MCV 81.6  --  81.2 82.1 82.6  PLT 294.0  --  233 249 223   CBG:  Recent Labs Lab 01/19/13 1138 01/19/13 1717 01/19/13 2114 01/20/13 0820 01/20/13 1150  GLUCAP 147* 209* 172*  172* 153* 123*    Micro Recent Results (from the past 240 hour(s))  CLOSTRIDIUM DIFFICILE BY PCR     Status: None   Collection Time    01/18/13  9:54 PM      Result Value Range Status   C difficile by pcr NEGATIVE  NEGATIVE Final     Studies: Ct Abdomen Pelvis W Contrast  01/18/2013   CLINICAL DATA:  Left lower quadrant pain, rectal bleeding, evaluate for diverticulitis  EXAM: CT ABDOMEN AND PELVIS WITH CONTRAST  TECHNIQUE: Multidetector CT imaging of the abdomen and pelvis was performed using the standard protocol following bolus administration of intravenous contrast.  CONTRAST:  OMNIPAQUE IOHEXOL 300 MG/ML  SOLN  COMPARISON:  None.  FINDINGS: Lung bases are clear.  Liver, spleen, pancreas, and adrenal glands are within normal limits.  Gallbladder is unremarkable. No intrahepatic or extrahepatic ductal dilatation.  Kidneys are within normal limits.  No hydronephrosis.  No evidence of bowel obstruction. Normal appendix. Extensive colonic diverticulosis.  Focal wall thickening/inflammatory changes involving the hepatic flexure (series 2/ image 39) and descending colon (coronal image 94). Two separate locations of acute diverticulitis are possible. However, neoplasm is not excluded.  Additional mild mucosal thickening along the sigmoid colon is favored to reflect chronic diverticulosis (series 2/image 63), although superimposed acute inflammatory change is possible.  No drainable fluid collection/abscess.  No free air.  No evidence of abdominal aortic aneurysm.  Small retroperitoneal nodes measuring up to 6 mm short axis (series 2/ image 41), within normal limits.  Status post hysterectomy.  Bilateral ovaries are unremarkable.  Bladder is within normal limits.  Mild degenerative changes of the visualized  thoracolumbar spine.  IMPRESSION: Focal wall thickening/inflammatory changes involving the hepatic flexure and descending colon, possibly reflecting two separate areas of acute diverticulitis. However, neoplasm is not excluded. Follow-up colonoscopy is suggested.  No drainable fluid collection/abscess.  No free air.  No findings suspicious for metastatic disease.   Electronically Signed   By: Charline Bills M.D.   On: 01/18/2013 14:10    Scheduled Meds: . amLODipine  5 mg Oral Daily  . ciprofloxacin  400 mg Intravenous Q12H  . hydrocortisone  25 mg Rectal BID  . insulin aspart  0-15 Units Subcutaneous TID WC  . insulin detemir  20 Units Subcutaneous QHS  . lisinopril  20 mg Oral Daily  . metronidazole  500 mg Intravenous Q8H  . pantoprazole  40 mg Oral Daily   Continuous Infusions: . sodium chloride        Conley Canal  [facility] Pager [phone] If 7PM-7AM, please contact night-coverage at [email or web address], password [facility] 01/20/2013, 12:52 PM  LOS: 2 days

## 2013-01-20 NOTE — Care Management Note (Unsigned)
    Page 1 of 1   01/20/2013     3:14:41 PM   CARE MANAGEMENT NOTE 01/20/2013  Patient:  Katrina Garrison   Account Number:  [id number]  Date Initiated:  01/20/2013  Documentation initiated by:  Letha Cape  Subjective/Objective Assessment:   dx gib  admit- lives alone.     Action/Plan:   Anticipated DC Date:  01/21/2013   Anticipated DC Plan:  HOME W HOME HEALTH SERVICES         Choice offered to / List presented to:             Status of service:  In process, will continue to follow Medicare Important Message given?   (If response is "NO", the following Medicare IM given date fields will be blank) Date Medicare IM given:   Date Additional Medicare IM given:    Discharge Disposition:    Per UR Regulation:  Reviewed for med. necessity/level of care/duration of stay  If discussed at Long Length of Stay Meetings, dates discussed:    Comments:  01/20/13 15:11 Letha Cape RN, BSN [phone] patient lives alone, NCM will continue to follow for dc needs.

## 2013-01-20 NOTE — Consult Note (Signed)
[facility] Consult: 10:26 AM 01/20/2013  LOS: 2 days    Referring Provider: Dr Gwenlyn Perking  Primary Care Physician:  Evette Georges, MD Primary Gastroenterologist:  Dr. Russella Dar     Reason for Consultation:  N/V and bloody diarrhea.    HPI: Katrina Garrison is a 72 y.o. female.  Hx of type 2 DM, Nephrolithiasis, diverticulosis, non adenomatous colon polyps.  Seen 12/16 at PMD office c/o pain in lower abdomen and low back for one week.  No associated problems with urination or change in stool habits.  U/A was negative, KUB to rule out kidney stones was unremarkable. Did not use much of RXd Tramadol  Due to drowsiness. Taking 3 Aleve daily and Ibuprofen 600 mg at HS helped her sleep and get through work day.  Had to take off 4 days from work a few days later because of diarrhea, recurrent LLQ/pelvic area pain and non-bloody emesis, myalgias.  Thought she had the flu.  This resolved and able to return to work on 12/22 but pain, diarrhea and malaise persisted. 1 AM Tuesday to mid Wednesday began passing bood with stools and eventually pure clots of blood, quantity was moderate.  ROV to Dr Fabian Sharp 12/24.  All NSAIDs stopped, Augmentin initiated.  CT scan done 12/14 showed inflammatory changes hepatic flexure and descending colon, possibly reflecting two separate areas of acute diverticulitis. Based on this she was admitted to hospital that same day, so never started the Augmentin.  WBCs 10.7 on 12/24, 15.6 on 12/25, 11.6 today.  Hgb was 12.8 on 03/17/12, down to 10.8 yesterday and 8.0 today.   Day 3 IV Cipro/Flagyl.   No BMs or bleeding since midday 12/23.  Pain improved. Tolerating  clears  Denies dizziness, nose bleeds, blood in urine.  Fevers have resolved, none documented since arrival. .    Past Medical History    Diagnosis Date  . Diabetes mellitus type II 2005  . GERD (gastroesophageal reflux disease)   . Hypertension   . History of lithotripsy     x3 for kidney stones  . Hx of colonoscopy 02/2008    for painless hematochezia.  found non adenomatous polyp, int/ext hemorrhoids, left sided diverticulosis.      Past Surgical History  Procedure Laterality Date  . Abdominal hysterectomy    . Childbirth x 2      Prior to Admission medications   Medication Sig Start Date End Date Taking? Authorizing Provider  amLODipine (NORVASC) 5 MG tablet Take 5 mg by mouth daily.  05/25/12  Yes Historical Provider, MD  ibuprofen (ADVIL,MOTRIN) 200 MG tablet Take 200 mg by mouth every 6 (six) hours as needed.     Yes Historical Provider, MD  Insulin Detemir (LEVEMIR FLEXPEN) 100 UNIT/ML SOPN Inject 40 Units into the skin at bedtime. 12/16/12  Yes Carlus Pavlov, MD  insulin lispro (HUMALOG KWIKPEN) 100 UNIT/ML SOPN Inject 12 units a day as advised 11/14/12  Yes Carlus Pavlov, MD  lisinopril (PRINIVIL,ZESTRIL) 20 MG tablet Take 20 mg by mouth daily.   Yes Historical Provider, MD  metFORMIN (  GLUCOPHAGE) 500 MG tablet Take 2 tablets (1,000 mg total) by mouth daily with supper. 12/16/12  Yes Carlus Pavlov, MD  traMADol (ULTRAM) 50 MG tablet Take 1 tablet (50 mg total) by mouth every 8 (eight) hours as needed. 01/11/13  Yes Baker Pierini, FNP  amoxicillin-clavulanate (AUGMENTIN) 875-125 MG per tablet Take 1 tablet by mouth every 12 (twelve) hours. 01/18/13   Madelin Headings, MD    Scheduled Meds: . amLODipine  5 mg Oral Daily  . ciprofloxacin  400 mg Intravenous Q12H  . insulin aspart  0-15 Units Subcutaneous TID WC  . insulin detemir  20 Units Subcutaneous QHS  . lisinopril  20 mg Oral Daily  . metronidazole  500 mg Intravenous Q8H  . pantoprazole  40 mg Oral Daily   Infusions: . sodium chloride 75 mL/hr at 01/20/13 0809   PRN Meds: acetaminophen, acetaminophen, morphine injection, ondansetron  (ZOFRAN) IV, ondansetron   Allergies as of 01/18/2013 - Review Complete 01/18/2013  Allergen Reaction Noted  . Metformin and related Diarrhea 06/06/2012  . Sulfamethoxazole  07/27/2006    Family History  Problem Relation Age of Onset  . Cancer Mother     throat  . Diabetes Mother   . Hypertension Mother   . Alcohol abuse Father   . Hypertension Father   . Multiple sclerosis Sister   . Multiple sclerosis Sister     History   Social History  . Marital Status: Divorced    Spouse Name: N/A    Number of Children: N/A  . Years of Education: N/A   Occupational History  . 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    Social History Main Topics  . Smoking status: Former Smoker -- 1.5 years    Quit date: 01/28/1985  . Smokeless tobacco: Not on file  . Alcohol Use: Not on file  . Drug Use: Not on file  . Sexual Activity: Not on file   Other Topics Concern  . Not on file   Social History Narrative  . Attentive daughters who live out of town.     REVIEW OF SYSTEMS: Constitutional:  5 # or so weight loss in last few weeks ENT:  No nose bleeds Pulm:  No SOB, no cough CV:  No palpitations, no LE edema.  GU:  No hematuria, no frequency GI:  Per HPI Heme:  Remotely was anemic, does not recall circumstnaces   Transfusions:  None ever Neuro:  No headaches, no peripheral tingling or numbness Derm:  No itching, no rash or sores.  Endocrine:  No sweats or chills.  No polyuria or dysuria Immunization:  10/2012 flu shot Travel:  None beyond local counties in last few months.    PHYSICAL EXAM: Vital signs in last 24 hours: Filed Vitals:   01/20/13 0637  BP: 163/76  Pulse: 68  Temp: 99.1 F (37.3 C)  Resp: 16   Wt Readings from Last 3 Encounters:  01/18/13 59.603 kg (131 lb 6.4 oz)  01/18/13 60.782 kg (134 lb)  01/10/13 61.236 kg (135 lb)    General: pleasant, well-appearing AAF.  Comfortable.  Head:  No asymmetry or swelling.  No trauma    Eyes:  No icterus or pallor Ears:  Not HOH.  Nose:  No discharge or congestion Mouth:  Clear, moist, good dentition.  Neck:  No mass or JVD Lungs:  Clear bil.  Excellent BS.  No dyspnea or cough. Heart: RRR.  No MRG Abdomen:  Soft, ND, tender focally  in LLQ, no guard or rebound.  Active BS.  No mass.   Rectal: visible blood at rectum, external and internal hemorrhoids    Musc/Skeltl: no joint deformity or swelling Extremities:  No CCE  Neurologic:  Oriented x 3.  No tremor, no limb weakness. Skin:  No rash or sores Tattoos:  none Nodes:  No inguinal or cervical adenopathy.    Psych:  Pleasant, cooperative, good historian, relaxed.   Intake/Output from previous day: 12/25 0701 - 12/26 0700 In: 3103 [P.O.:1158; I.V.:1545; IV Piggyback:400] Out: -  Intake/Output this shift:    LAB RESULTS:  Recent Labs  01/19/13 0035 01/19/13 1614 01/20/13 0518  WBC 15.6* 11.6* 7.0  HGB 9.0* 8.6* 8.0*  HCT 26.3* 26.1* 24.6*  PLT 233 249 223  MCV    82  BMET Lab Results  Component Value Date   NA 147* 01/20/2013   NA 139 01/19/2013   NA 137 01/18/2013   K 3.5 01/20/2013   K 4.8 01/19/2013   K 3.7 01/18/2013   CL 113* 01/20/2013   CL 105 01/19/2013   CL 101 01/18/2013   CO2 26 01/20/2013   CO2 26 01/19/2013   CO2 26 01/18/2013   GLUCOSE 111* 01/20/2013   GLUCOSE 226* 01/19/2013   GLUCOSE 158* 01/18/2013   BUN 5* 01/20/2013   BUN 11 01/19/2013   BUN 8 01/18/2013   CREATININE 0.60 01/20/2013   CREATININE 0.67 01/19/2013   CREATININE 0.53 01/18/2013   CALCIUM 8.4 01/20/2013   CALCIUM 8.5 01/19/2013   CALCIUM 9.4 01/18/2013   LFT  Recent Labs  01/18/13 1354  PROT 7.1  ALBUMIN 3.4*  AST 11  ALT 8  ALKPHOS 93  BILITOT 0.3   C-Diff Negative on 01/18/13.    Lipase     Component Value Date/Time   LIPASE 26 01/18/2013 1354     RADIOLOGY STUDIES: Ct Abdomen Pelvis W Contrast 01/18/2013  FINDINGS: Lung bases are clear.  Liver, spleen, pancreas, and adrenal  glands are within normal limits.  Gallbladder is unremarkable. No intrahepatic or extrahepatic ductal dilatation.  Kidneys are within normal limits.  No hydronephrosis.  No evidence of bowel obstruction. Normal appendix. Extensive colonic diverticulosis.  Focal wall thickening/inflammatory changes involving the hepatic flexure (series 2/ image 39) and descending colon (coronal image 94). Two separate locations of acute diverticulitis are possible. However, neoplasm is not excluded.  Additional mild mucosal thickening along the sigmoid colon is favored to reflect chronic diverticulosis (series 2/image 63), although superimposed acute inflammatory change is possible.  No drainable fluid collection/abscess.  No free air.  No evidence of abdominal aortic aneurysm.  Small retroperitoneal nodes measuring up to 6 mm short axis (series 2/ image 41), within normal limits.  Status post hysterectomy.  Bilateral ovaries are unremarkable.  Bladder is within normal limits.  Mild degenerative changes of the visualized thoracolumbar spine.  IMPRESSION: Focal wall thickening/inflammatory changes involving the hepatic flexure and descending colon, possibly reflecting two separate areas of acute diverticulitis. However, neoplasm is not excluded. Follow-up colonoscopy is suggested.  No drainable fluid collection/abscess.  No free air.  No findings suspicious for metastatic disease.   Electronically Signed   By: Charline Bills M.D.   On: 01/18/2013 14:10    ENDOSCOPIC STUDIES: 02/2008  Colonoscopy  Dr Russella Dar Indication:  Hematochezia ENDOSCOPIC IMPRESSION:  1) Moderate diverticulosis in the transverse colon  2) Moderate diverticulosis in the sigmoid to descending  3) 5 mm sessile polyp  4) Internal and external hemorrhoids  RECOMMENDATIONS:  1) await pathology results  2) high fiber diet  3) If the polyp removed today proves to be adenomatous (pre-cancerous) polyps, you will need a repeat colonoscopy in 5 years. Otherwise  you should continue to follow colorectal cancer screening guidelines for "routine risk" patients with colonoscopy in 10 years. Pathology COLON, SIGMOID POLYP: POLYPOID INFLAMED GRANULATION TISSUE. SEE COMMENT.    IMPRESSION:   *  Abdominal pain for last 3 weeks, progressive intensity and developed bloody diarrhea early this week, now resolved. . CT scan with inflammation in left colon. Hemorrhoids and BRB PR by rectal exam.  Rule out diverticulitis with hemorrhoidal bleeding, rule out ischemic colitis (no hypotension documented at various visits) Coloscopy in 2010 with diverticulosis and non-adenomatous sigmoid polyp. Generally do not see bloody stool with diverticulitis.   *  Normocytic anemia.  Hgb down from 12.8 to 8.0 over 6 days.   *  DM type 2, insulin requiring.    PLAN:     *  Advance to solid diet. *  Add Anusol HC suppostiories.  *  No flex sig given increased risk of perforation *  Stop IVF.  q 12 hour CBC.    Jennye Moccasin  01/20/2013, 10:26 AM Pager: [phone]  GI Attending Note   Chart was reviewed and patient was examined. X-rays and lab were reviewed.  Changes in colon on CT are most likely related to ischemic colitis.  This is most c/w clinical course of pain and bleeding.  Since she is improving will defer endoscopic studies.  Should she have acute diverticulitis, scope manipulation could be dangerous. May advance diet.  Would Rx with antibiotics for a 10 day course.   Barbette Hair. Arlyce Dice, M.D., [facility] Cell [phone]

## 2013-01-20 NOTE — Progress Notes (Signed)
Addendum  Patient seen and examined, chart and data base reviewed.  I agree with the above assessment and plan.  For full details please see Mrs. Algis Downs PA note.  Acute colitis/diverticulitis, and clear etiology, cannot rule out infectious versus ischemic.  Patient also had lower bleeding, is being treated empirically for infectious colitis with Cipro/Metro. GI to evaluate.   Clint Lipps, MD [facility] Pager: [phone] 01/20/2013, 1:25 PM

## 2013-01-21 DIAGNOSIS — K922 Gastrointestinal hemorrhage, unspecified: Secondary | ICD-10-CM

## 2013-01-21 DIAGNOSIS — K559 Vascular disorder of intestine, unspecified: Principal | ICD-10-CM

## 2013-01-21 LAB — BASIC METABOLIC PANEL
Calcium: 8.8 mg/dL (ref 8.4–10.5)
GFR calc Af Amer: >90 mL/min (ref >90)
GFR calc non Af Amer: >90 mL/min (ref >90)
Glucose, Bld: 156 mg/dL — ABNORMAL HIGH (ref 70–99)
Potassium: 3.6 mEq/L (ref 3.5–5.1)
Sodium: 140 mEq/L (ref 135–145)

## 2013-01-21 LAB — GLUCOSE, CAPILLARY
Glucose-Capillary: 178 mg/dL — ABNORMAL HIGH (ref 70–99)
Glucose-Capillary: 202 mg/dL — ABNORMAL HIGH (ref 70–99)
Glucose-Capillary: 254 mg/dL — ABNORMAL HIGH (ref 70–99)
Glucose-Capillary: 167 mg/dL — ABNORMAL HIGH (ref 70–99)

## 2013-01-21 LAB — CBC
HCT: 25.9 % — ABNORMAL LOW (ref 36.0–46.0)
Hemoglobin: 8.5 g/dL — ABNORMAL LOW (ref 12.0–15.0)
MCHC: 32.8 g/dL (ref 30.0–36.0)
Platelets: 257 10*3/uL (ref 150–400)
WBC: 5.6 10*3/uL (ref 4.0–10.5)

## 2013-01-21 MED ORDER — CIPROFLOXACIN HCL 500 MG PO TABS
500.0000 mg | ORAL_TABLET | Freq: Two times a day (BID) | ORAL | Status: DC
  Administered 2013-01-21 – 2013-01-22 (×2): 500 mg via ORAL
  Filled 2013-01-21 (×4): qty 1

## 2013-01-21 MED ORDER — METRONIDAZOLE 500 MG PO TABS
500.0000 mg | ORAL_TABLET | Freq: Three times a day (TID) | ORAL | Status: DC
  Administered 2013-01-21 – 2013-01-22 (×3): 500 mg via ORAL
  Filled 2013-01-21 (×6): qty 1

## 2013-01-21 NOTE — Progress Notes (Signed)
[facility] PROGRESS NOTE  Katrina Garrison ZOX:[id number] DOB: [birth year] DOA: 01/18/2013 PCP: Evette Georges, MD  HPI/Subjective: Patient feeling much better, daughter at bedside, questions answered  Interval history: 72 year old African American female with history of diabetes and hypertension came in to the hospital because of nausea, vomiting and diarrhea. Patient said she had her symptoms for several days but for the past 2 days she started to have bloody bowel movements. She went to her primary care physician who sent her to the emergency department for further evaluation. Hemoglobin on admission was 11.6.  Assessment/Plan: Active Problems:   IDDM   HYPERTENSION   GERD   GI bleed   Abdominal pain, left lower quadrant   Nausea with vomiting   Abdominal  pain, other specified site   Ischemic colitis   Acute colitis/diverticulitis -Appreciate [facility] evaluation and recommendations. -Patient presented with nausea, vomiting, diarrhea and rectal bleeding.  These symptoms are improved.  -CT scan done and showed findings consistent with acute colitis/diverticulitis. -Cipro/Metro switched to oral, diet advanced to regular.  Rectal bleeding.  Acute Blood Loss Anemia. -Secondary to Ischemic colitis. -Hgb dropped from 11.6 - 8.0 this admit.  If she continues to drop into the 7s will likely transfuse. -Hemoglobin improved since yesterday from 8.0 to 8.5.  Hypernatremia -Mild (sodium 147) -Will continue IVF with 0.45 NS at 75 ml / hour. This is resolved will discontinue IVF.  Hypertension -Her blood pressure medications continued, blood pressure moderately controlled. -Will consider increasing amlodipine to 10 mg daily.  DM -Hgb A1C is 11.2 -Patient sees Endocrinologist Foye Spurling and is on Metformin, Humalog and Levemir. -Patient would like to D/C metformin on discharge due to diarrhea and continue increased dose of insulin. -increased levemir to 20 units  and SSI to moderate on 12/26.  GERD -Patient was on PPI, held because suspicion of C. Difficile.  It has been restarted.  Code Status: Full code Family Communication: Plan discussed with the patient and her two daughters at length at bedside. Disposition Plan: Remains inpatient   Consultants:   GI  Procedures:  None  Antibiotics:  Cipro/Metro   Objective: Filed Vitals:   01/21/13 0656  BP: 130/74  Pulse: 73  Temp: 97.8 F (36.6 C)  Resp: 18    Intake/Output Summary (Last 24 hours) at 01/21/13 1206 Last data filed at 01/21/13 1100  Gross per 24 hour  Intake 3033.75 ml  Output    301 ml  Net 2732.75 ml   Filed Weights   01/18/13 1808  Weight: 59.603 kg (131 lb 6.4 oz)    Exam: General: Alert and awake, oriented x3, not in any acute distress. Very Pleasant. HEENT: anicteric sclera, pupils reactive to light and accommodation, EOMI CVS: S1-S2 clear, no murmur rubs or gallops Chest: clear to auscultation bilaterally, no wheezing, rales or rhonchi Abdomen: soft nontender, nondistended, normal bowel sounds, no organomegaly Extremities: no cyanosis, clubbing or edema noted bilaterally Neuro: Cranial nerves II-XII intact, no focal neurological deficits  Data Reviewed: Basic Metabolic Panel:  Recent Labs Lab 01/18/13 0957 01/18/13 1354 01/19/13 0350 01/20/13 0518 01/21/13 0630  NA 142 137 139 147* 140  K 4.6 3.7 4.8 3.5 3.6  CL 106 101 105 113* 109  CO2 28 26 26 26 24   GLUCOSE 151* 158* 226* 111* 156*  BUN 9 8 11  5* 6  CREATININE 0.5 0.53 0.67 0.60 0.56  CALCIUM 9.5 9.4 8.5 8.4 8.8   Liver Function Tests:  Recent Labs Lab 01/18/13 1354  AST 11  ALT 8  ALKPHOS 93  BILITOT 0.3  PROT 7.1  ALBUMIN 3.4*    Recent Labs Lab 01/18/13 1354  LIPASE 26   CBC:  Recent Labs Lab 01/18/13 0957 01/18/13 1603 01/19/13 0035 01/19/13 1614 01/20/13 0518 01/21/13 0630  WBC 10.7*  --  15.6* 11.6* 7.0 5.6  NEUTROABS 8.4*  --   --   --   --    --   HGB 11.6* 10.8* 9.0* 8.6* 8.0* 8.5*  HCT 35.9* 32.2* 26.3* 26.1* 24.6* 25.9*  MCV 81.6  --  81.2 82.1 82.6 82.5  PLT 294.0  --  233 249 223 257   CBG:  Recent Labs Lab 01/20/13 1150 01/20/13 1700 01/20/13 2124 01/21/13 0752 01/21/13 1144  GLUCAP 123* 227* 237* 178* 202*    Micro Recent Results (from the past 240 hour(s))  CLOSTRIDIUM DIFFICILE BY PCR     Status: None   Collection Time    01/18/13  9:54 PM      Result Value Range Status   C difficile by pcr NEGATIVE  NEGATIVE Final     Studies: No results found.  Scheduled Meds: . amLODipine  5 mg Oral Daily  . ciprofloxacin  500 mg Oral BID  . hydrocortisone  25 mg Rectal BID  . insulin aspart  0-15 Units Subcutaneous TID WC  . insulin detemir  20 Units Subcutaneous QHS  . lisinopril  20 mg Oral Daily  . metroNIDAZOLE  500 mg Oral Q8H  . pantoprazole  40 mg Oral Daily   Continuous Infusions:      Martita Brumm A, PA-C  [facility] Pager [phone] If 7PM-7AM, please contact night-coverage at [email or web address], password [facility] 01/21/2013, 12:06 PM  LOS: 3 days

## 2013-01-21 NOTE — Progress Notes (Signed)
          Daily Rounding Note  01/21/2013, 1:41 PM  LOS: 3 days   SUBJECTIVE:       Stools x 3 or 4 today are dark, leach blood into commode water.  Latest was larger in volume, family showed be photos on the phone.  Tolerating solids.  Minor lingering tenderness in LLQ.  Long walks in hall way.  No dizziness  OBJECTIVE:         Vital signs in last 24 hours:    Temp:  [97.8 F (36.6 C)-98.8 F (37.1 C)] 97.8 F (36.6 C) (12/27 0656) Pulse Rate:  [70-73] 73 (12/27 0656) Resp:  [18] 18 (12/27 0656) BP: (104-130)/(60-74) 130/74 mmHg (12/27 0656) SpO2:  [92 %-95 %] 95 % (12/27 0656) Last BM Date: 01/21/13 General: looks well, comfortable   Heart: RRR Chest: clear, no labored breathing Abdomen: soft, minor tenderness in LLQ, ND.  Active BS Extremities: no CCE Neuro/Psych:  Pleasant, no confusiion, relaxed.   Intake/Output from previous day: 12/26 0701 - 12/27 0700 In: 2611.3 [P.O.:240; I.V.:1671.3; IV Piggyback:700] Out: 301 [Urine:300; Stool:1]  Intake/Output this shift: Total I/O In: 867.5 [P.O.:240; I.V.:627.5] Out: -   Lab Results:  Recent Labs  01/19/13 1614 01/20/13 0518 01/21/13 0630  WBC 11.6* 7.0 5.6  HGB 8.6* 8.0* 8.5*  HCT 26.1* 24.6* 25.9*  PLT 249 223 257   BMET  Recent Labs  01/19/13 0350 01/20/13 0518 01/21/13 0630  NA 139 147* 140  K 4.8 3.5 3.6  CL 105 113* 109  CO2 26 26 24   GLUCOSE 226* 111* 156*  BUN 11 5* 6  CREATININE 0.67 0.60 0.56  CALCIUM 8.5 8.4 8.8   LFT  Recent Labs  01/18/13 1354  PROT 7.1  ALBUMIN 3.4*  AST 11  ALT 8  ALKPHOS 93  BILITOT 0.3     ASSESMENT:   * Abdominal pain for last 3 weeks, progressive intensity and developed bloody diarrhea early this week, now resolved. .  CT scan with inflammation in left colon. Hemorrhoids and BRB PR by rectal exam. Rule out diverticulitis with hemorrhoidal bleeding, rule out ischemic colitis (no hypotension documented  at various visits)  Coloscopy in 2010 with diverticulosis and non-adenomatous sigmoid polyp. Generally do not see bloody stool with diverticulitis.  Added anusol suppositories 12/26.  Day 4 abx:  cipro flagyl Iv x 3 days, now on po versions as of today.  * Normocytic anemia. Hgb down from 12.8 to 8.0 over 6 days.  * DM type 2, insulin requiring.    PLAN   *  Continue same supportive care.  *  AM CBC *  10 total days of ABX *  Should follow up with Dr Russella Dar within 3 to 4 weeks.     Jennye Moccasin  01/21/2013, 1:41 PM Pager: [phone]  GI Attending Note  I have personally taken an interval history, reviewed the chart, and examined the patient.  I agree with the extender's note, impression and recommendations. OK to switch to by mouth antibiotics.  Discharge in a.m. if blood counts are stable.  Barbette Hair. Arlyce Dice, MD, [facility] [phone]

## 2013-01-22 DIAGNOSIS — IMO0001 Reserved for inherently not codable concepts without codable children: Secondary | ICD-10-CM

## 2013-01-22 LAB — CBC
MCH: 26.8 pg (ref 26.0–34.0)
MCV: 82.7 fL (ref 78.0–100.0)
Platelets: 283 10*3/uL (ref 150–400)
RBC: 3.58 MIL/uL — ABNORMAL LOW (ref 3.87–5.11)
RDW: 14.4 % (ref 11.5–15.5)
WBC: 7.5 10*3/uL (ref 4.0–10.5)

## 2013-01-22 LAB — GLUCOSE, CAPILLARY: Glucose-Capillary: 181 mg/dL — ABNORMAL HIGH (ref 70–99)

## 2013-01-22 MED ORDER — METRONIDAZOLE 500 MG PO TABS: 500.0000 mg | ORAL_TABLET | Freq: Three times a day (TID) | ORAL | Status: DC

## 2013-01-22 MED ORDER — CIPROFLOXACIN HCL 500 MG PO TABS: 500.0000 mg | ORAL_TABLET | Freq: Two times a day (BID) | ORAL | Status: DC

## 2013-01-22 MED ORDER — HYDROCORTISONE ACETATE 25 MG RE SUPP: 25.0000 mg | Freq: Two times a day (BID) | RECTAL | Status: DC | PRN

## 2013-01-22 MED ORDER — HYDROCORTISONE ACETATE 25 MG RE SUPP
25.0000 mg | Freq: Two times a day (BID) | RECTAL | Status: DC | PRN
Start: 2013-01-22 — End: 2013-01-22
  Filled 2013-01-22: qty 1

## 2013-01-22 NOTE — Progress Notes (Signed)
          Daily Rounding Note  01/22/2013, 10:47 AM  LOS: 4 days   SUBJECTIVE:       Small volume of blood leaching from 2 small BMs this AM.  Several small volume brown BMs after this.  Mild left sided cramping that resolved after BM.  Tolerating solids.  Not using any pain meds.   OBJECTIVE:         Vital signs in last 24 hours:    Temp:  [98.2 F (36.8 C)-98.7 F (37.1 C)] 98.2 F (36.8 C) (12/28 0514) Pulse Rate:  [74-80] 75 (12/28 0514) Resp:  [18] 18 (12/28 0514) BP: (129-145)/(70-80) 145/70 mmHg (12/28 0514) SpO2:  [92 %-99 %] 92 % (12/28 0514) Last BM Date: 01/22/13 General: looks well   Heart: RRR Chest: clear Abdomen: soft, ND, minor tenderness LLQ, no guard or rebound.  Normal BS  Extremities: no CCE Neuro/Psych:  Pleasant, alert in good spirits.   Intake/Output from previous day: 12/27 0701 - 12/28 0700 In: 1667.5 [P.O.:1040; I.V.:627.5] Out: -   Intake/Output this shift: Total I/O In: -  Out: 300 [Urine:300]  Lab Results:  Recent Labs  01/20/13 0518 01/21/13 0630 01/22/13 0755  WBC 7.0 5.6 7.5  HGB 8.0* 8.5* 9.6*  HCT 24.6* 25.9* 29.6*  PLT 223 257 283   BMET  Recent Labs  01/20/13 0518 01/21/13 0630  NA 147* 140  K 3.5 3.6  CL 113* 109  CO2 26 24  GLUCOSE 111* 156*  BUN 5* 6  CREATININE 0.60 0.56  CALCIUM 8.4 8.8    Studies/Results: No results found.  ASSESMENT:   * Abdominal pain for last 3 weeks, progressive intensity and developed bloody diarrhea early this week, now resolved. .  CT scan with inflammation in left colon. Hemorrhoids and BRB PR by rectal exam. Rule out diverticulitis with hemorrhoidal bleeding, rule out ischemic colitis (no hypotension documented at various visits)  Coloscopy in 2010 with diverticulosis and non-adenomatous sigmoid polyp. Generally do not see bloody stool with diverticulitis.  Added anusol suppositories 12/26. Day 4 abx: cipro flagyl Iv x 3 days,  now on po versions as of today.  * Normocytic anemia. Hgb down from baseline but up 1 gram in last 24 hours, has not required transfusion.  * DM type 2, insulin requiring.    PLAN   *  Ok to discharge home today.  * 10 total days of ABX  * Should follow up with Dr Russella Dar within 3 to 4 weeks. Phone # provided for her to arrange ROV with [facility] or Willette Cluster NP who she had seen before     Katrina Garrison  01/22/2013, 10:47 AM Pager: [phone]

## 2013-01-22 NOTE — Progress Notes (Signed)
01/22/13 Patient being discharged home  Today. IV site removed and discharge instructions reviewed by patient and family.

## 2013-01-22 NOTE — Discharge Summary (Signed)
Physician Discharge Summary  Katrina Garrison ZOX:[id number] DOB: [birth year] DOA: 01/18/2013  PCP: Evette Georges, MD  Admit date: 01/18/2013 Discharge date: 01/22/2013  Time spent: 40 minutes  Recommendations for Outpatient Follow-up:  1. Followup with Dr. Russella Dar as outpatient within 3 weeks  Discharge Diagnoses:  Active Problems:   Type II diabetes mellitus, uncontrolled   HYPERTENSION   GERD   GI bleed   Abdominal pain, left lower quadrant   Nausea with vomiting   Abdominal  pain, other specified site   Ischemic colitis   Discharge Condition: Stable  Diet recommendation: Carb modified diet  Filed Weights   01/18/13 1808  Weight: 59.603 kg (131 lb 6.4 oz)    History of present illness:  Katrina Garrison is a 72 y.o. female with a past medical history significant for hypertension, diabetes, GERD and diverticulosis; came to the hospital complaining of abdominal pain, nausea/vomiting and hematochezia. Patient reports her symptoms been present for the last 2 to days prior to admission and worsening. Patient is now on able to keep things down probably in a stable and baby week and tired. Patient denies fever, chills, hematemesis, headache, chest pain, shortness of breath, cough or any other acute complaints.  Patient reports her abdominal pain is cramping in nature and localized in her lower quadrants (left more than right). In the ED patient was found with mild leukocytosis, CT of the abdomen demonstrated focal inflammation in her left splenic And descending colon suggesting diverticulitis. [facility] has been called to admit the patient for further evaluation and treatment.  Hospital Course:   1. Acute colitis: Unclear etiology, patient presented with nausea, vomiting and diarrhea as well as rectal bleeding. Patient has abdominal pain as well. CT scan of abdomen/pelvis was done and showed finding consistent with acute colitis involving the hepatic flexure. GI was  consulted and this is thought to be likely secondary to ischemic colitis but infectious etiology cannot be ruled out. Patient started on ciprofloxacin and metronidazole, her C. difficile PCR was negative, GI recommended Cipro/Metro for total of 10 days.  2. Rectal bleeding: Started 2 days prior to admission, likely secondary to severe diarrhea. DRE showed internal hemorrhoids which likely irritated by the diarrhea caused the bleeding. Patient discharged on Anusol.  3. Anemia: Acute blood loss anemia, hemoglobin on admission was 11.6, dropped to nadir of 8.0, after the fluid stopped hemoglobin went up to 9.5 again. Which suggests delusional component to the anemia.  4. GERD: Patient is not on antacids, instructed not to take the NSAIDs elicit true needed because it's contribution to current and gastritis  Procedures:  None  Consultations:  Gastroenterology  Discharge Exam: Filed Vitals:   01/22/13 0514  BP: 145/70  Pulse: 75  Temp: 98.2 F (36.8 C)  Resp: 18   General: Alert and awake, oriented x3, not in any acute distress. HEENT: anicteric sclera, pupils reactive to light and accommodation, EOMI CVS: S1-S2 clear, no murmur rubs or gallops Chest: clear to auscultation bilaterally, no wheezing, rales or rhonchi Abdomen: soft nontender, nondistended, normal bowel sounds, no organomegaly Extremities: no cyanosis, clubbing or edema noted bilaterally Neuro: Cranial nerves II-XII intact, no focal neurological deficits  Discharge Instructions  Discharge Orders   Future Appointments Provider Department Dept Phone   01/30/2013 8:00 AM Carlus Pavlov, MD [facility] [phone]   Future Orders Complete By Expires   Diet Carb Modified  As directed    Increase activity slowly  As directed  Medication List    STOP taking these medications       amoxicillin-clavulanate 875-125 MG per tablet  Commonly known as:  AUGMENTIN      TAKE these medications        amLODipine 5 MG tablet  Commonly known as:  NORVASC  Take 5 mg by mouth daily.     ciprofloxacin 500 MG tablet  Commonly known as:  CIPRO  Take 1 tablet (500 mg total) by mouth 2 (two) times daily.     hydrocortisone 25 MG suppository  Commonly known as:  ANUSOL-HC  Place 1 suppository (25 mg total) rectally 2 (two) times daily as needed for hemorrhoids (for bright red rectal bleeding.).     ibuprofen 200 MG tablet  Commonly known as:  ADVIL,MOTRIN  Take 200 mg by mouth every 6 (six) hours as needed.     Insulin Detemir 100 UNIT/ML Sopn  Commonly known as:  LEVEMIR FLEXPEN  Inject 40 Units into the skin at bedtime.     insulin lispro 100 UNIT/ML Sopn  Commonly known as:  HUMALOG KWIKPEN  Inject 12 units a day as advised     lisinopril 20 MG tablet  Commonly known as:  PRINIVIL,ZESTRIL  Take 20 mg by mouth daily.     metFORMIN 500 MG tablet  Commonly known as:  GLUCOPHAGE  Take 2 tablets (1,000 mg total) by mouth daily with supper.     metroNIDAZOLE 500 MG tablet  Commonly known as:  FLAGYL  Take 1 tablet (500 mg total) by mouth every 8 (eight) hours.     traMADol 50 MG tablet  Commonly known as:  ULTRAM  Take 1 tablet (50 mg total) by mouth every 8 (eight) hours as needed.       Allergies  Allergen Reactions  . Metformin And Related Diarrhea  . Sulfamethoxazole     REACTION: "comatose"       Follow-up Information   Schedule an appointment as soon as possible for a visit with Judie Petit T. Russella Dar, MD. (ask for appointment with Dr Russella Dar or with Willette Cluster or any physician assistant.  should be seen in around 3rd to 4th week of January. )    Specialty:  Gastroenterology   Contact information:   [address] [place] [address] [phone]        The results of significant diagnostics from this hospitalization (including imaging, microbiology, ancillary and laboratory) are listed below for reference.    Significant Diagnostic Studies: Dg Abd  1 View  01/10/2013   CLINICAL DATA:  Left lower quadrant abdomen pain.  EXAM: ABDOMEN - 1 VIEW  COMPARISON:  None.  FINDINGS: The bowel gas pattern is normal. No radio-opaque calculi or other significant radiographic abnormality are seen.  IMPRESSION: Negative.   Electronically Signed   By: Davonna Belling M.D.   On: 01/10/2013 14:49   Ct Abdomen Pelvis W Contrast  01/18/2013   CLINICAL DATA:  Left lower quadrant pain, rectal bleeding, evaluate for diverticulitis  EXAM: CT ABDOMEN AND PELVIS WITH CONTRAST  TECHNIQUE: Multidetector CT imaging of the abdomen and pelvis was performed using the standard protocol following bolus administration of intravenous contrast.  CONTRAST:  OMNIPAQUE IOHEXOL 300 MG/ML  SOLN  COMPARISON:  None.  FINDINGS: Lung bases are clear.  Liver, spleen, pancreas, and adrenal glands are within normal limits.  Gallbladder is unremarkable. No intrahepatic or extrahepatic ductal dilatation.  Kidneys are within normal limits.  No hydronephrosis.  No evidence of bowel obstruction.  Normal appendix. Extensive colonic diverticulosis.  Focal wall thickening/inflammatory changes involving the hepatic flexure (series 2/ image 39) and descending colon (coronal image 94). Two separate locations of acute diverticulitis are possible. However, neoplasm is not excluded.  Additional mild mucosal thickening along the sigmoid colon is favored to reflect chronic diverticulosis (series 2/image 63), although superimposed acute inflammatory change is possible.  No drainable fluid collection/abscess.  No free air.  No evidence of abdominal aortic aneurysm.  Small retroperitoneal nodes measuring up to 6 mm short axis (series 2/ image 41), within normal limits.  Status post hysterectomy.  Bilateral ovaries are unremarkable.  Bladder is within normal limits.  Mild degenerative changes of the visualized thoracolumbar spine.  IMPRESSION: Focal wall thickening/inflammatory changes involving the hepatic flexure and  descending colon, possibly reflecting two separate areas of acute diverticulitis. However, neoplasm is not excluded. Follow-up colonoscopy is suggested.  No drainable fluid collection/abscess.  No free air.  No findings suspicious for metastatic disease.   Electronically Signed   By: Charline Bills M.D.   On: 01/18/2013 14:10    Microbiology: Recent Results (from the past 240 hour(s))  CLOSTRIDIUM DIFFICILE BY PCR     Status: None   Collection Time    01/18/13  9:54 PM      Result Value Range Status   C difficile by pcr NEGATIVE  NEGATIVE Final     Labs: Basic Metabolic Panel:  Recent Labs Lab 01/18/13 0957 01/18/13 1354 01/19/13 0350 01/20/13 0518 01/21/13 0630  NA 142 137 139 147* 140  K 4.6 3.7 4.8 3.5 3.6  CL 106 101 105 113* 109  CO2 28 26 26 26 24   GLUCOSE 151* 158* 226* 111* 156*  BUN 9 8 11  5* 6  CREATININE 0.5 0.53 0.67 0.60 0.56  CALCIUM 9.5 9.4 8.5 8.4 8.8   Liver Function Tests:  Recent Labs Lab 01/18/13 1354  AST 11  ALT 8  ALKPHOS 93  BILITOT 0.3  PROT 7.1  ALBUMIN 3.4*    Recent Labs Lab 01/18/13 1354  LIPASE 26   No results found for this basename: AMMONIA,  in the last 168 hours CBC:  Recent Labs Lab 01/18/13 0957  01/19/13 0035 01/19/13 1614 01/20/13 0518 01/21/13 0630 01/22/13 0755  WBC 10.7*  --  15.6* 11.6* 7.0 5.6 7.5  NEUTROABS 8.4*  --   --   --   --   --   --   HGB 11.6*  < > 9.0* 8.6* 8.0* 8.5* 9.6*  HCT 35.9*  < > 26.3* 26.1* 24.6* 25.9* 29.6*  MCV 81.6  --  81.2 82.1 82.6 82.5 82.7  PLT 294.0  --  233 249 223 257 283  < > = values in this interval not displayed. Cardiac Enzymes: No results found for this basename: CKTOTAL, CKMB, CKMBINDEX, TROPONINI,  in the last 168 hours BNP: BNP (last 3 results) No results found for this basename: PROBNP,  in the last 8760 hours CBG:  Recent Labs Lab 01/21/13 1144 01/21/13 1653 01/21/13 2130 01/22/13 0744 01/22/13 1202  GLUCAP 202* 254* 167* 181* 196*        Signed:  Dashiel Bergquist A  [facility] 01/22/2013, 12:11 PM

## 2013-01-22 NOTE — Progress Notes (Signed)
GI Attending Note  I have personally taken an interval history, reviewed the chart, and examined the patient.  I agree with the extender's note, impression and recommendations.  Abdulmalik Darco D. Timera Windt, MD, FACG [facility] [phone]  

## 2013-01-23 ENCOUNTER — Telehealth: Payer: Self-pay | Admitting: Family Medicine

## 2013-01-23 ENCOUNTER — Encounter: Payer: Self-pay | Admitting: Internal Medicine

## 2013-01-23 NOTE — Telephone Encounter (Signed)
i will call patient at the end of the day

## 2013-01-23 NOTE — Telephone Encounter (Signed)
I will fax the letter.  Do you know if he will want to see her in the office?

## 2013-01-23 NOTE — Telephone Encounter (Signed)
Pt was admitted to hospital on 1/24 by Dr. Fabian Sharp.  Pt is requesting a note be faxed to her job stating that she will be returning to work on 1/5.  Pt will be seeing Dr. Wyonia Hough at 8am that day.  Please fax 2 separate notes Attn: Arman Bogus (Human Resources) and Attn: Legrand Pitts (Department VP). Fax is [phone].  Thanks.

## 2013-01-23 NOTE — Telephone Encounter (Signed)
i did not admit her to hospital but did advise her to go if she got worse  Ok to write note that she was out of work for medical disease and hosptializations   She should have a post hospital follow up with her pcp and/ or or GI  Specialist .   Please have her arrange to see  A Gi specialist  Or dr Tawanna Cooler

## 2013-01-23 NOTE — Telephone Encounter (Signed)
Spoke with patient and she would like to switch PCP from Dr Tawanna Cooler to Ms Orvan Falconer.  Okay?

## 2013-01-24 NOTE — Telephone Encounter (Signed)
ok 

## 2013-01-24 NOTE — Telephone Encounter (Signed)
okl

## 2013-01-30 ENCOUNTER — Encounter: Payer: Self-pay | Admitting: Internal Medicine

## 2013-01-30 ENCOUNTER — Ambulatory Visit (INDEPENDENT_AMBULATORY_CARE_PROVIDER_SITE_OTHER): Payer: 59 | Admitting: Internal Medicine

## 2013-01-30 VITALS — BP 132/74 | HR 97 | Temp 98.5°F | Resp 10 | Wt 135.5 lb

## 2013-01-30 DIAGNOSIS — IMO0001 Reserved for inherently not codable concepts without codable children: Secondary | ICD-10-CM

## 2013-01-30 DIAGNOSIS — E1165 Type 2 diabetes mellitus with hyperglycemia: Principal | ICD-10-CM

## 2013-01-30 MED ORDER — GLUCOSE BLOOD VI STRP: ORAL_STRIP | Status: DC

## 2013-01-30 NOTE — Progress Notes (Signed)
Patient ID: Katrina Garrison, female   DOB: 07-Aug-1940, 73 y.o.   MRN: [id number]  HPI: Katrina Garrison is a 73 y.o.-year-old female, returning for f/u for DM2, dx 2009, insulin-dependent since 2012-13, uncontrolled, without chronic complications, with hospitalization for diabetic coma x 2 (last was 3 years ago). Last visit 1.5 mo ago.   She was hospitalized for 5 days for diverticulitis 0n 12/24. She was taken off Metformin and is not on it now. She was on Cipro + Flagyl. She will have a colonoscopy soon (this Spring) - Dr Russella Dar. She still has some left sided AP. Last Cr was normal.  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  Patient is on the following regimen: - metformin 500 mg in am (>> stopped during her hospitalization, and did not restart - I am not sure why she was not on 2 tablets of 500 taken at night, as advised last visit) - Levemir 40 units hs - she mentions that she was taking this every night - Humalog 12 units before a regular meal and 15 units before a large meal - I suspect that she was taking less than this, as she had with her the previous written instructions that I gave her, for lower doses of Humalogposterior Stopped Januvia 100 mg daily >> b/c CP.  Pt was in the past on a regimen of Humalog 75/25 pen - 50 units daily once a day - but was forgetting the insulin 1/7 days. She was taking the insulin at bedtime >> lows at night and highs, mostly in the 200s but also some in the 300s in am. She is doing better on Levemir.  She was also on Glipizide 10 bid in the past. She was on Metformin 1000 mg po bid, but stopped 2/2 diarrhea which is incapacitating, and is impeding her work.  Pt checks her sugars 2-3x (no log today log) a day and they are much improved after starting mealtime insulin and also after her  diverticulitis is resolved: - am: fluctuating from 96-250 >> 150-230 >> 207-307 >> 114-192 >> 129-296 (<200) >> 100s - 2 hours after breakfast: 356 >> 161 and 186 >> 187 >> n/c - before lunch: upper 100s-up to 200 >> 204 >> 143 and 220 >> 123-146 >> 130s - 2h after lunch: 161 >> n/c - before dinner: upper 100s-up to 200 >> 234-341 >> 154, 220, 354 >> 146-170 (highest 288) >> 100s - after dinner: 126 >> n/c  - Bedtime >> not checking >> 223-404 >> not checking >> 124-156 >> n/c No lows; she has hypoglycemia awareness at 60. Highest sugar 200s in the hospital.   - Pt does not have chronic kidney disease. Last BUN/Cr: Lab Results  Component Value Date   BUN 6 01/21/2013   CREATININE 0.56 01/21/2013  She is on Lisinopril.  - she does not have HL. Last Lipid panel: Lab Results  Component Value Date   CHOL 127 03/17/2012   HDL 38.80* 03/17/2012   LDLCALC 71 03/17/2012   TRIG 86.0 03/17/2012   CHOLHDL 3 03/17/2012  Sh is not on a statin. - Pt's last eye exam was in 06/2012, with Dr. Elmer Picker. No DR, no macular edema. Has cataracts >> will see Dr Elmer Picker again in 03/2013. - Denies numbness and tingling in her legs - Dr. Angelia Mould, podiatry.  She also has a  history of asthma, and is on frequent prednisone tapers, last around 02/2012; also HTN;  h/o nephrolithiasis, s/p lithotripsy; GERD.  ROS: Constitutional: no weight gain/loss, no fatigue, no subjective hyperthermia/hypothermia Eyes: no blurry vision, no xerophthalmia ENT: no sore throat, no nodules palpated in throat, no dysphagia/odynophagia, no hoarseness Cardiovascular: no CP/SOB/palpitations/leg swelling Respiratory: no cough/SOB Gastrointestinal: no N/V/D/C, + LLP AP (mild) Musculoskeletal: no muscle/joint aches Skin: no rashes Neurological: no tremors/numbness/tingling/dizziness   I reviewed pt's medications, allergies, PMH, social hx, family hx and no changes required, except as mentioned above.  PE: BP 132/74  Pulse 97   Temp(Src) 98.5 F (36.9 C) (Oral)  Resp 10  Wt 135 lb 8 oz (61.462 kg) Wt Readings from Last 3 Encounters:  01/30/13 135 lb 8 oz (61.462 kg)  01/18/13 131 lb 6.4 oz (59.603 kg)  01/18/13 134 lb (60.782 kg)   Constitutional: normal weight, in NAD, looking younger than stated age Eyes: PERRLA, EOMI, no exophthalmos ENT: moist mucous membranes, no thyromegaly, no cervical lymphadenopathy Cardiovascular: RRR, No MRG Respiratory: CTA B Gastrointestinal: abdomen soft, TTP in LLQ, ND, BS+ Musculoskeletal: no deformities, strength intact in all 4 Skin: moist, warm, no rashes  ASSESSMENT: 1. DM2, insulin-dependent, uncontrolled, without complications  PLAN:  1. Patient with very poorly controlled diabetes, on basal insulin, and now mealtime insulin, too, with much improved control, especially after treating her diverticulitis. - I advised her to: Patient Instructions  Restart Metformin 500 mg x2 tablets with dinner Continue Levemir 40 units at night. Continue Humalog 12 units with a regular meal and 15 units before a large meal. Please return in 2 months with sugar log.  - she already had the flu vaccine this fall - she has Metformin from [employer] at home - I will see the patient back in 2 mo with her sugar log

## 2013-01-30 NOTE — Patient Instructions (Signed)
Restart Metformin 500 mg x2 tablets with dinner  Continue Levemir 40 units at night.  Continue Humalog 12 units before a regular meal and 15 units before a large meal.  Please return in 2 months with sugar log.

## 2013-01-31 ENCOUNTER — Telehealth: Payer: Self-pay | Admitting: Family Medicine

## 2013-01-31 NOTE — Telephone Encounter (Signed)
Caller: Tiffanie/Patient; Phone: [phone]; Reason for Call: Pt was hospitalized and had a note to return to work 01/30/13.  Pt went to work but had to leave illl with vomiting and diarrhea.  Requesting an extension of note till 02/06/13.  Please fax the note to Attn: Arman BogusKim Payne and Legrand Pittsebbie Tuggle at fax #[phone].  If any questions or problems please call pt at [phone].

## 2013-02-01 ENCOUNTER — Ambulatory Visit: Payer: 59 | Admitting: Family

## 2013-02-01 ENCOUNTER — Encounter: Payer: Self-pay | Admitting: *Deleted

## 2013-02-01 NOTE — Telephone Encounter (Signed)
I hadn't seen her since 12/16. She has seen Dr. Fabian SharpPanosh and the ED more recently.

## 2013-02-01 NOTE — Telephone Encounter (Signed)
Letter okay by Dr Fabian SharpPanosh.  Note faxed and confirmed.  Follow up appointment made with Ms Orvan FalconerCampbell.  Patient is aware.

## 2013-02-08 ENCOUNTER — Ambulatory Visit: Payer: 59 | Admitting: Family

## 2013-02-09 NOTE — H&P (Signed)
[facility] History and Physical  Katrina Garrison MRN:[id number] DOB: 04/12/1940 DOA: 01/18/2013  Referring physician: Dr. Zackowski PCP: TODD,JEFFREY ALLEN, MD   Chief Complaint: Abdominal pain, nausea, vomiting, hematochezia.  HPI: Katrina Garrison is a 73 y.o. female with a past medical history significant for hypertension, diabetes, GERD and diverticulosis; came to the hospital complaining of abdominal pain, nausea/vomiting and hematochezia. Patient reports her symptoms been present for the last 2 to days prior to admission and worsening. Patient is now on able to keep things down probably in a stable and baby week and tired. Patient denies fever, chills, hematemesis, headache, chest pain, shortness of breath, cough or any other acute complaints. Patient reports her abdominal pain is cramping in nature and localized in her lower quadrants (left more than right). In the ED patient was found with mild leukocytosis, CT of the abdomen demonstrated focal inflammation in her left splenic And descending colon suggesting diverticulitis. [facility] to [facility] the patient for further evaluation and treatment.  Review of Systems:  Negative except as otherwise mentioned on history of present illness  Past Medical History  Diagnosis Date  . Diabetes mellitus type II   . GERD (gastroesophageal reflux disease)   . Hypertension   . History of lithotripsy     x3 for kidney stones   Past Surgical History  Procedure Laterality Date  . Abdominal hysterectomy    . Childbirth x 2     Social History:  reports that she quit smoking about 27 years ago. She does not have any smokeless tobacco history on file. Her alcohol and drug histories are not on file.  Allergies  Allergen Reactions  . Metformin And Related Diarrhea  . Sulfamethoxazole     REACTION: "comatose"    Family History  Problem Relation Age of Onset  . Cancer Mother     throat  . Diabetes Mother   .  Hypertension Mother   . Alcohol abuse Father   . Hypertension Father   . Multiple sclerosis Sister   . Multiple sclerosis Sister      Prior to Admission medications   Medication Sig Start Date End Date Taking? Authorizing Provider  amLODipine (NORVASC) 5 MG tablet Take 5 mg by mouth daily.  05/25/12  Yes Historical Provider, MD  ibuprofen (ADVIL,MOTRIN) 200 MG tablet Take 200 mg by mouth every 6 (six) hours as needed.     Yes Historical Provider, MD  Insulin Detemir (LEVEMIR FLEXPEN) 100 UNIT/ML SOPN Inject 40 Units into the skin at bedtime. 12/16/12  Yes Cristina Gherghe, MD  insulin lispro (HUMALOG KWIKPEN) 100 UNIT/ML SOPN Inject 12 units a day as advised 11/14/12  Yes Cristina Gherghe, MD  lisinopril (PRINIVIL,ZESTRIL) 20 MG tablet Take 20 mg by mouth daily.   Yes Historical Provider, MD  metFORMIN (GLUCOPHAGE) 500 MG tablet Take 2 tablets (1,000 mg total) by mouth daily with supper. 12/16/12  Yes Cristina Gherghe, MD  traMADol (ULTRAM) 50 MG tablet Take 1 tablet (50 mg total) by mouth every 8 (eight) hours as needed. 01/11/13  Yes Padonda B Campbell, FNP  amoxicillin-clavulanate (AUGMENTIN) 875-125 MG per tablet Take 1 tablet by mouth every 12 (twelve) hours. 01/18/13   Wanda K Panosh, MD   Physical Exam: Filed Vitals:   01/18/13 1808  BP: 116/62  Pulse: 61  Temp: 97.5 F (36.4 C)  Resp: 16    BP 116/62  Pulse 61  Temp(Src) 97.5 F (36.4 C) (Oral)  Resp 16  Ht 5' 2" (1.575   m)  Wt 59.603 kg (131 lb 6.4 oz)  BMI 24.03 kg/m2  SpO2 96%  General:  Appears calm and comfortable, afebrile and competitive to examination. Eyes: PERRL, normal lids, irises & conjunctiva ENT: grossly normal hearing, mild dryness of her oral mucosal, no erythema or exudate inside her mouth. No drainage out of her ears or nostrils Neck: no LAD, masses or thyromegaly Cardiovascular: RRR, no m/r/g. No LE edema. Telemetry: SR, no arrhythmias  Respiratory: CTA bilaterally, no w/r/r. Normal respiratory  effort. Abdomen: soft, ntnd Skin: no rash or induration seen on limited exam Musculoskeletal: grossly normal tone BUE/BLE Psychiatric: grossly normal mood and affect, speech fluent and appropriate Neurologic: grossly non-focal.          Labs on Admission:  Basic Metabolic Panel:  Recent Labs Lab 01/18/13 0957 01/18/13 1354  NA 142 137  K 4.6 3.7  CL 106 101  CO2 28 26  GLUCOSE 151* 158*  BUN 9 8  CREATININE 0.5 0.53  CALCIUM 9.5 9.4   Liver Function Tests:  Recent Labs Lab 01/18/13 1354  AST 11  ALT 8  ALKPHOS 93  BILITOT 0.3  PROT 7.1  ALBUMIN 3.4*    Recent Labs Lab 01/18/13 1354  LIPASE 26   CBC:  Recent Labs Lab 01/18/13 0957 01/18/13 1603  WBC 10.7*  --   NEUTROABS 8.4*  --   HGB 11.6* 10.8*  HCT 35.9* 32.2*  MCV 81.6  --   PLT 294.0  --    CBG:  Recent Labs Lab 01/18/13 1508  GLUCAP 159*    Radiological Exams on Admission: Ct Abdomen Pelvis W Contrast  01/18/2013   CLINICAL DATA:  Left lower quadrant pain, rectal bleeding, evaluate for diverticulitis  EXAM: CT ABDOMEN AND PELVIS WITH CONTRAST  TECHNIQUE: Multidetector CT imaging of the abdomen and pelvis was performed using the standard protocol following bolus administration of intravenous contrast.  CONTRAST:  100mL OMNIPAQUE IOHEXOL 300 MG/ML  SOLN  COMPARISON:  None.  FINDINGS: Lung bases are clear.  Liver, spleen, pancreas, and adrenal glands are within normal limits.  Gallbladder is unremarkable. No intrahepatic or extrahepatic ductal dilatation.  Kidneys are within normal limits.  No hydronephrosis.  No evidence of bowel obstruction. Normal appendix. Extensive colonic diverticulosis.  Focal wall thickening/inflammatory changes involving the hepatic flexure (series 2/ image 39) and descending colon (coronal image 94). Two separate locations of acute diverticulitis are possible. However, neoplasm is not excluded.  Additional mild mucosal thickening along the sigmoid colon is favored to  reflect chronic diverticulosis (series 2/image 63), although superimposed acute inflammatory change is possible.  No drainable fluid collection/abscess.  No free air.  No evidence of abdominal aortic aneurysm.  Small retroperitoneal nodes measuring up to 6 mm short axis (series 2/ image 41), within normal limits.  Status post hysterectomy.  Bilateral ovaries are unremarkable.  Bladder is within normal limits.  Mild degenerative changes of the visualized thoracolumbar spine.  IMPRESSION: Focal wall thickening/inflammatory changes involving the hepatic flexure and descending colon, possibly reflecting two separate areas of acute diverticulitis. However, neoplasm is not excluded. Follow-up colonoscopy is suggested.  No drainable fluid collection/abscess.  No free air.  No findings suspicious for metastatic disease.   Electronically Signed   By: Sriyesh  Krishnan M.D.   On: 01/18/2013 14:10   Assessment/Plan 1-abdominal pain, nausea/vomiting and rectal bleeding: Presumed to be secondary to infectious diverticulitis. -Patient will be admitted to MedSurg -Will track hemoglobin trend -Fluid resuscitation and supportive care -IV ciprofloxacin and Flagyl -  Will check C. difficile by PCR -Contact precautions while ruling out C. Difficile -If patient's bleeding worsen will consult GI for colonoscopy; otherwise patient can have a colonoscopy in about 4 weeks after discharge. . 2-IDDM: Will check hemoglobin A1c and continue Levemir and a sliding scale insulin. -Will hold metformin while inpatient.  3-HYPERTENSION: Continue current antihypertensive regimen. Bloodpressure stable and well controlled.    4-GERD: Will use famotidine while ruling out C. difficile. If C. difficile negative will resume the use of PPI.  5-GI bleed: Hematochezia in nature most likely do to colitis or diverticulosis. -Treatment as mentioned above an outpatient colonoscopy unless patient deteriorates and required inpatient GI  assessment.  DVT: SCDs   Code Status: Full Family Communication: daughters at bedside Disposition Plan: LOS > 2 midnights, med-surg, inpatient  Time spent: 50 minutes  Katrina Garrison [facility] Pager [phone]  

## 2013-02-16 ENCOUNTER — Encounter: Payer: Self-pay | Admitting: Family

## 2013-02-17 ENCOUNTER — Encounter: Payer: Self-pay | Admitting: Family Medicine

## 2013-02-20 ENCOUNTER — Ambulatory Visit (INDEPENDENT_AMBULATORY_CARE_PROVIDER_SITE_OTHER): Payer: 59 | Admitting: Family

## 2013-02-20 ENCOUNTER — Ambulatory Visit: Payer: 59 | Admitting: Gastroenterology

## 2013-02-20 ENCOUNTER — Encounter: Payer: Self-pay | Admitting: Family

## 2013-02-20 VITALS — BP 142/82 | HR 72 | Wt 138.0 lb

## 2013-02-20 DIAGNOSIS — E118 Type 2 diabetes mellitus with unspecified complications: Secondary | ICD-10-CM

## 2013-02-20 DIAGNOSIS — K5730 Diverticulosis of large intestine without perforation or abscess without bleeding: Secondary | ICD-10-CM

## 2013-02-20 DIAGNOSIS — I10 Essential (primary) hypertension: Secondary | ICD-10-CM

## 2013-02-20 DIAGNOSIS — D62 Acute posthemorrhagic anemia: Secondary | ICD-10-CM

## 2013-02-20 DIAGNOSIS — K5790 Diverticulosis of intestine, part unspecified, without perforation or abscess without bleeding: Secondary | ICD-10-CM

## 2013-02-20 MED ORDER — LISINOPRIL 20 MG PO TABS: 20.0000 mg | ORAL_TABLET | Freq: Every day | ORAL | Status: DC

## 2013-02-20 MED ORDER — AMLODIPINE BESYLATE 5 MG PO TABS: 5.0000 mg | ORAL_TABLET | Freq: Every day | ORAL | Status: DC

## 2013-02-20 NOTE — Patient Instructions (Signed)
Gastrointestinal Bleeding °Gastrointestinal (GI) bleeding means there is bleeding somewhere along the digestive tract, between the mouth and anus. °CAUSES  °There are many different problems that can cause GI bleeding. Possible causes include: °· Esophagitis. This is inflammation, irritation, or swelling of the esophagus. °· Hemorrhoids. These are veins that are full of blood (engorged) in the rectum. They cause pain, inflammation, and may bleed. °· Anal fissures. These are areas of painful tearing which may bleed. They are often caused by passing hard stool. °· Diverticulosis. These are pouches that form on the colon over time, with age, and may bleed significantly. °· Diverticulitis. This is inflammation in areas with diverticulosis. It can cause pain, fever, and bloody stools, although bleeding is rare. °· Polyps and cancer. Colon cancer often starts out as precancerous polyps. °· Gastritis and ulcers. Bleeding from the upper gastrointestinal tract (near the stomach) may travel through the intestines and produce black, sometimes tarry, often bad smelling stools. In certain cases, if the bleeding is fast enough, the stools may not be black, but red. This condition may be life-threatening. °SYMPTOMS  °· Vomiting bright red blood or material that looks like coffee grounds. °· Bloody, black, or tarry stools. °DIAGNOSIS  °Your caregiver may diagnose your condition by taking your history and performing a physical exam. More tests may be needed, including: °· X-rays and other imaging tests. °· Esophagogastroduodenoscopy (EGD). This test uses a flexible, lighted tube to look at your esophagus, stomach, and small intestine. °· Colonoscopy. This test uses a flexible, lighted tube to look at your colon. °TREATMENT  °Treatment depends on the cause of your bleeding.  °· For bleeding from the esophagus, stomach, small intestine, or colon, the caregiver doing your EGD or colonoscopy may be able to stop the bleeding as part of  the procedure. °· Inflammation or infection of the colon can be treated with medicines. °· Many rectal problems can be treated with creams, suppositories, or warm baths. °· Surgery is sometimes needed. °· Blood transfusions are sometimes needed if you have lost a lot of blood. °If bleeding is slow, you may be allowed to go home. If there is a lot of bleeding, you will need to stay in the hospital for observation. °HOME CARE INSTRUCTIONS  °· Take any medicines exactly as prescribed. °· Keep your stools soft by eating foods that are high in fiber. These foods include whole grains, legumes, fruits, and vegetables. Prunes (1 to 3 a day) work well for many people. °· Drink enough fluids to keep your urine clear or pale yellow. °SEEK IMMEDIATE MEDICAL CARE IF:  °· Your bleeding increases. °· You feel lightheaded, weak, or you faint. °· You have severe cramps in your back or abdomen. °· You pass large blood clots in your stool. °· Your problems are getting worse. °MAKE SURE YOU:  °· Understand these instructions. °· Will watch your condition. °· Will get help right away if you are not doing well or get worse. °Document Released: 01/10/2000 Document Revised: 12/30/2011 Document Reviewed: 12/22/2010 °[employer]® Patient Information ©2014 [employer]. ° °

## 2013-02-21 DIAGNOSIS — K5790 Diverticulosis of intestine, part unspecified, without perforation or abscess without bleeding: Secondary | ICD-10-CM | POA: Insufficient documentation

## 2013-02-21 LAB — CBC WITH DIFFERENTIAL/PLATELET
BASOS PCT: 1.9 % (ref 0.0–3.0)
Basophils Absolute: 0.1 10*3/uL (ref 0.0–0.1)
EOS PCT: 1.2 % (ref 0.0–5.0)
Eosinophils Absolute: 0.1 10*3/uL (ref 0.0–0.7)
HCT: 35.9 % — ABNORMAL LOW (ref 36.0–46.0)
HEMOGLOBIN: 11.9 g/dL — AB (ref 12.0–15.0)
Lymphocytes Relative: 31.6 % (ref 12.0–46.0)
Lymphs Abs: 2.2 10*3/uL (ref 0.7–4.0)
MCHC: 33.3 g/dL (ref 30.0–36.0)
MCV: 82.4 fl (ref 78.0–100.0)
Monocytes Absolute: 0.3 10*3/uL (ref 0.1–1.0)
Monocytes Relative: 4.8 % (ref 3.0–12.0)
NEUTROS ABS: 4.3 10*3/uL (ref 1.4–7.7)
Neutrophils Relative %: 60.5 % (ref 43.0–77.0)
Platelets: 287 10*3/uL (ref 150.0–400.0)
RBC: 4.35 Mil/uL (ref 3.87–5.11)
RDW: 15.9 % — ABNORMAL HIGH (ref 11.5–14.6)
WBC: 7 10*3/uL (ref 4.5–10.5)

## 2013-02-21 NOTE — Progress Notes (Signed)
Subjective:    Patient ID: Katrina Garrison, female    DOB: [birth year], 73 y.o.   MRN: [id number]  HPI  73 year old [place], nonsmoker, transfer from Dr. Bartholomew Boardsy is in today for hospital followup. She was originally seen on 01/10/2013 with back pain and lower abdominal pain. She subsequently seen on 01/18/2013 and was found to have blood in her stool, worsening of abdominal pain and back pain. She was sent to the emergency department and admitted for infectious diverticulitis. She has since been doing well. Has a followup February 13 with gastroenterology. Denies any concerns today. Hemoglobin dropped in the hospital from 13-8.6. She has not had it rechecked.  Review of Systems  Constitutional: Negative.   HENT: Negative.   Respiratory: Negative.   Cardiovascular: Negative.   Gastrointestinal: Negative.  Negative for nausea, diarrhea, constipation and abdominal distention.       Mild cramping intermittently.   Genitourinary: Negative.   Skin: Negative.   Neurological: Negative.   Psychiatric/Behavioral: Negative.    Past Medical History  Diagnosis Date  . Diabetes mellitus type II 2005  . GERD (gastroesophageal reflux disease)   . Hypertension   . History of lithotripsy     x3 for kidney stones  . Diverticulosis   . Anemia   . Diverticulitis     History   Social History  . Marital Status: Divorced    Spouse Name: N/A    Number of Children: N/A  . Years of Education: N/A   Occupational History  . Not on file.   Social History Main Topics  . Smoking status: Former Smoker -- 1.5 years    Quit date: 01/28/1985  . Smokeless tobacco: Not on file  . Alcohol Use: No  . Drug Use: No  . Sexual Activity: Not on file   Other Topics Concern  . Not on file   Social History Narrative  . No narrative on file    Past Surgical History  Procedure Laterality Date  . Abdominal hysterectomy    . Childbirth x 2      Family History  Problem Relation Age of Onset   . Throat cancer Mother   . Diabetes Mother   . Hypertension Mother   . Alcohol abuse Father   . Hypertension Father   . Multiple sclerosis Sister   . Multiple sclerosis Sister     Allergies  Allergen Reactions  . Metformin And Related Diarrhea  . Sulfamethoxazole     REACTION: "comatose"    Current Outpatient Prescriptions on File Prior to Visit  Medication Sig Dispense Refill  . glucose blood test strip Use 3x a day - OneTouch  200 each  11  . ibuprofen (ADVIL,MOTRIN) 200 MG tablet Take 200 mg by mouth every 6 (six) hours as needed.        . Insulin Detemir (LEVEMIR FLEXPEN) 100 UNIT/ML SOPN Inject 40 Units into the skin at bedtime.  5 pen  11  . insulin lispro (HUMALOG KWIKPEN) 100 UNIT/ML SOPN Inject 12 units a day as advised  5 pen  11  . metFORMIN (GLUCOPHAGE) 500 MG tablet Take 2 tablets (1,000 mg total) by mouth daily with supper.  180 tablet  3  . traMADol (ULTRAM) 50 MG tablet Take 1 tablet (50 mg total) by mouth every 8 (eight) hours as needed.  30 tablet  0  . hydrocortisone (ANUSOL-HC) 25 MG suppository Place 1 suppository (25 mg total) rectally 2 (two) times daily as needed for hemorrhoids (for bright  red rectal bleeding.).  12 suppository  0   No current facility-administered medications on file prior to visit.    BP 142/82  Pulse 72  Wt 138 lb (62.596 kg)chart    Objective:   Physical Exam  Constitutional: She is oriented to person, place, and time. She appears well-developed and well-nourished.  HENT:  Right Ear: External ear normal.  Left Ear: External ear normal.  Nose: Nose normal.  Mouth/Throat: Oropharynx is clear and moist.  Neck: Normal range of motion. Neck supple.  Cardiovascular: Normal rate, regular rhythm and normal heart sounds.   Pulmonary/Chest: Effort normal and breath sounds normal.  Abdominal: Soft. Bowel sounds are normal. She exhibits no distension. There is no tenderness. There is no rebound.  Musculoskeletal: Normal range of motion.   Neurological: She is alert and oriented to person, place, and time.  Skin: Skin is warm and dry.  Psychiatric: She has a normal mood and affect.          Assessment & Plan:  Assessment: 1. Diverticulosis-due to GI bleed, avoid nonsteroidal anti-inflammatory medications 2. Anemia from blood loss there sent CBC, limited from patient and the results. 3. Hypertension-continue current medications 4. Type 2 diabetes-continue seeing endocrinology for management.

## 2013-06-14 ENCOUNTER — Ambulatory Visit: Payer: 59 | Admitting: Family Medicine

## 2013-06-28 ENCOUNTER — Ambulatory Visit: Payer: 59 | Admitting: Internal Medicine

## 2013-07-10 ENCOUNTER — Ambulatory Visit (INDEPENDENT_AMBULATORY_CARE_PROVIDER_SITE_OTHER): Payer: 59 | Admitting: Internal Medicine

## 2013-07-10 ENCOUNTER — Encounter: Payer: Self-pay | Admitting: Internal Medicine

## 2013-07-10 VITALS — BP 136/72 | HR 95 | Temp 98.1°F | Resp 14 | Ht 62.0 in | Wt 133.8 lb

## 2013-07-10 DIAGNOSIS — E1165 Type 2 diabetes mellitus with hyperglycemia: Principal | ICD-10-CM

## 2013-07-10 DIAGNOSIS — IMO0001 Reserved for inherently not codable concepts without codable children: Secondary | ICD-10-CM

## 2013-07-10 LAB — HEMOGLOBIN A1C: Hgb A1c MFr Bld: 11 % — ABNORMAL HIGH (ref 4.6–6.5)

## 2013-07-10 NOTE — Patient Instructions (Signed)
Continue: - Metformin 1000 mg daily at dinnertime - Levemir 40 units at night - Humalog 12 units before lunch  Please return in 1 month with your sugar log. Check sugars 2-3x a day.

## 2013-07-10 NOTE — Progress Notes (Signed)
Patient ID: Katrina Garrison, female   DOB: Apr 22, 1940, 73 y.o.   MRN: [id number]  HPI: Katrina Melteraulette Grisso is a 73 y.o.-year-old female, returning for f/u for DM2, dx 2009, insulin-dependent since 2012-13, uncontrolled, without chronic complications, with hospitalization for diabetic coma x 2 (last was 3 years ago). Last visit 5.5 mo ago.   She will be on M'[employer] A,B, and C starting July as she will switch to working part time. She will not afford her meds then. She still has anough insulin for at least 2 more months.  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  Patient is on the following regimen: - Metformin 1000 mg daily at dinnertime - Levemir 40 units at night - Humalog 12 units before a regular meal (only using it for lunch!!!??? - despite advice to use it 3x a day) Stopped Januvia 100 mg daily >> b/c CP. Pt was in the past on a regimen of Humalog 75/25 pen - 50 units daily once a day - but was forgetting the insulin 1/7 days. She was taking the insulin at bedtime >> lows at night and highs, mostly in the 200s but also some in the 300s in am. She is doing better on Levemir.  She was also on Glipizide 10 bid in the past. She had severe diarrhea from metformin before.  Pt checks her sugars 1-2x (no log today again!) a day and they are good, but no sugars checked later in the day: - am: fluctuating from 96-250 >> 150-230 >> 207-307 >> 114-192 >> 129-296 (<200) >> 100s >> 119-130 - 2 hours after breakfast: 356 >> 161 and 186 >> 187 >> n/c  - before lunch: upper 100s-up to 200 >> 204 >> 143 and 220 >> 123-146 >> 130s >> 120-130 - 2h after lunch: 161 >> n/c - before dinner: upper 100s-up to 200 >> 234-341 >> 154, 220, 354 >> 146-170 (highest 288) >> 100s >> n/c - after dinner: 126 >> n/c  - Bedtime >> not checking >> 223-404 >> not  checking >> 124-156 >> n/c No lows; she has hypoglycemia awareness at 60. Highest sugar 190 _ day of her surgery.  - Pt does not have chronic kidney disease. Last BUN/Cr: Lab Results  Component Value Date   BUN 6 01/21/2013   CREATININE 0.56 01/21/2013  She is on Lisinopril.  - she does not have HL. Last Lipid panel: Lab Results  Component Value Date   CHOL 127 03/17/2012   HDL 38.80* 03/17/2012   LDLCALC 71 03/17/2012   TRIG 86.0 03/17/2012   CHOLHDL 3 03/17/2012  Sh is not on a statin. - Pt's last eye exam was in 03/2013, with Dr. Elmer PickerHecker. No DR, no macular edema. Has cataracts. Ha cataract Sx lat month for the L eye and will have the R eye surgery tomorrow. - Denies numbness and tingling in her legs - Dr. Angelia MouldAdjoury, podiatry.  She also has a history of asthma, and is on frequent prednisone tapers, last around 02/2012; also HTN;  h/o nephrolithiasis, s/p lithotripsy; GERD.  ROS: Constitutional: + weight loss, no fatigue, no subjective hyperthermia/hypothermia Eyes: no blurry vision, no xerophthalmia ENT: no sore throat, no nodules palpated in throat, no dysphagia/odynophagia, no hoarseness Cardiovascular: no CP/SOB/palpitations/leg swelling Respiratory: no cough/SOB Gastrointestinal: no N/V/D/C Musculoskeletal: no muscle/joint aches Skin: no rashes Neurological: no tremors/numbness/tingling/dizziness  I reviewed  pt's medications, allergies, PMH, social hx, family hx and no changes required, except as mentioned above.  PE: BP 136/72  Pulse 95  Temp(Src) 98.1 F (36.7 C)  Resp 14  Ht 5\' 2"  (1.575 m)  Wt 133 lb 12.8 oz (60.691 kg)  BMI 24.47 kg/m2 Wt Readings from Last 3 Encounters:  07/10/13 133 lb 12.8 oz (60.691 kg)  02/20/13 138 lb (62.596 kg)  01/30/13 135 lb 8 oz (61.462 kg)   Constitutional: normal weight, in NAD, looking younger than stated age Eyes: PERRLA, EOMI, no exophthalmos ENT: moist mucous membranes, no thyromegaly, no cervical  lymphadenopathy Cardiovascular: RRR, No MRG Respiratory: CTA B Gastrointestinal: abdomen soft, TTP in LLQ, ND, BS+ Musculoskeletal: no deformities, strength intact in all 4 Skin: moist, warm, no rashes  ASSESSMENT: 1. DM2, insulin-dependent, uncontrolled, without complications  PLAN:  1. Patient with improved diabetes control per her report but no sugar log and no recent HbA1c. She is on basal-bolus insulin. I need to see a log with her CBGs - needs to check at least 2x a day for the next 1 mo to decide about changing her regimen. At next visit, we can change to vials of Levemir and Humalog or even ReliOn insulins, if really needed. - I advised her to stay on the current regimen for now, until we get more info about her sugars: Patient Instructions  Continue: - Metformin 1000 mg daily at dinnertime - Levemir 40 units at night - Humalog 12 units before lunch Please return in 1 month with your sugar log. Check sugars 2-3x a day. - she has Metformin from [employer] >> tolerates this well - check A1c today - I am a little worried about her weight loss, ? if from poor DM control? - I will see the patient back in 1 mo with her sugar log   Office Visit on 07/10/2013  Component Date Value Ref Range Status  . Hemoglobin A1C 07/10/2013 11.0* 4.6 - 6.5 % Final   Glycemic Control Guidelines for People with Diabetes:Non Diabetic:  <6%Goal of Therapy: <7%Additional Action Suggested:  >8%    Poor DM control as suspected. Will absolutely need a log at next visit. Her current sugars do not reflect this HbA1c but unclear if they improved >> continue current regimen for now.

## 2013-08-09 ENCOUNTER — Ambulatory Visit: Payer: 59 | Admitting: Internal Medicine

## 2013-08-09 ENCOUNTER — Telehealth: Payer: Self-pay

## 2013-08-09 NOTE — Telephone Encounter (Signed)
Left message for pt to call back to schedule CPE. She will need to be fasting for labs

## 2013-08-15 ENCOUNTER — Telehealth: Payer: Self-pay | Admitting: Family

## 2013-08-15 NOTE — Telephone Encounter (Signed)
Pt called this morning requesting to speak with Fleet ContrasRachel, stated it was a personal matter, would not provide me with any information. Fleet ContrasRachel informed me that she was a pt of Padonda and to make you aware that she called.

## 2013-08-15 NOTE — Telephone Encounter (Signed)
Left message for pt to call back. Advised that she does need to schedule CPE and reminded that she has established with Oran ReinPadonda but if she really would prefer that CapronRachel call her back, I will let Fleet ContrasRachel know

## 2013-08-16 ENCOUNTER — Encounter: Payer: Self-pay | Admitting: Internal Medicine

## 2013-08-16 ENCOUNTER — Ambulatory Visit (INDEPENDENT_AMBULATORY_CARE_PROVIDER_SITE_OTHER): Payer: 59 | Admitting: Internal Medicine

## 2013-08-16 VITALS — BP 122/70 | HR 63 | Temp 98.8°F | Resp 12 | Wt 136.0 lb

## 2013-08-16 DIAGNOSIS — IMO0001 Reserved for inherently not codable concepts without codable children: Secondary | ICD-10-CM

## 2013-08-16 DIAGNOSIS — E1165 Type 2 diabetes mellitus with hyperglycemia: Principal | ICD-10-CM

## 2013-08-16 MED ORDER — RELION LANCETS MICRO-THIN 33G MISC: Status: DC

## 2013-08-16 MED ORDER — RELION CONFIRM GLUCOSE MONITOR W/DEVICE KIT: PACK | Status: DC

## 2013-08-16 MED ORDER — GLUCOSE BLOOD VI STRP: ORAL_STRIP | Status: DC

## 2013-08-16 NOTE — Patient Instructions (Signed)
Please continue the current regimen: - Metformin 500 mg 2x a day - Levemir 40 units at bedtime - Humalog 12 units with a meal  Please come back in 2 months with your sugar log.

## 2013-08-16 NOTE — Progress Notes (Signed)
Patient ID: Katrina Garrison, female   DOB: 07-15-1940, 73 y.o.   MRN: [id number]  HPI: Katrina Garrison is a 73 y.o.-year-old female, returning for f/u for DM2, dx 2009, insulin-dependent since 2012-13, uncontrolled, without chronic complications, with hospitalization for diabetic coma x 2. Last visit 1 mo ago.   She is now on M'care A,B, and C as she switched to working part time. She has BCBS for meds.  She had cataract sx and got an infection in the L eye >> undergoing tx.  Latest hemoglobin A1c: Lab Results  Component Value Date   HGBA1C 11.0* 07/10/2013   HGBA1C 11.2* 01/18/2013   HGBA1C 12.6* 10/14/2012  Anti-islet cell and anti-GAD antibodies were undetectable.  Patient is on the following regimen: - Metformin 500 mg bid - Levemir 40 units at night - Humalog 12 units before a regular meal - now 3x a day Stopped Januvia 100 mg daily >> b/c CP. Pt was in the past on a regimen of Humalog 75/25 pen - 50 units daily once a day - but was forgetting the insulin 1/7 days. She was taking the insulin at bedtime >> lows at night and highs, mostly in the 200s but also some in the 300s in am. She is doing better on Levemir.  She was also on Glipizide 10 bid in the past. She had severe diarrhea from metformin before.  Pt checks her sugars 2-4x a day and they are good, but no sugars checked later in the day: - am: fluctuating from 96-250 >> 150-230 >> 207-307 >> 114-192 >> 129-296 (<200) >> 100s >> 119-130 >> 99-136, 217, 245 - 2 hours after breakfast: 356 >> 161 and 186 >> 187 >> n/c  - before lunch: upper 100s-up to 200 >> 204 >> 143 and 220 >> 123-146 >> 130s >> 120-130 >> 113-142. 227, 245 - 2h after lunch: 161 >> n/c - before dinner: upper 100s-up to 200 >> 234-341 >> 154, 220, 354 >> 146-170 (highest 288) >> 100s >> n/c >> 102-156 - after dinner: 126 >> n/c  - Bedtime >> not checking >> 223-404 >> not checking >> 124-156 >> n/c >> 112-141 No lows; she has hypoglycemia awareness at 60.  Highest sugar 245  - Pt does not have chronic kidney disease. Last BUN/Cr: Lab Results  Component Value Date   BUN 6 01/21/2013   CREATININE 0.56 01/21/2013  She is on Lisinopril. - she does not have HL. Last Lipid panel: Lab Results  Component Value Date   CHOL 127 03/17/2012   HDL 38.80* 03/17/2012   LDLCALC 71 03/17/2012   TRIG 86.0 03/17/2012   CHOLHDL 3 03/17/2012  She is not on a statin. - Pt's last eye exam was in 03/2013, with Dr. Elmer Picker. No DR, no macular edema. Has cataracts. Had cataract Sx OU this year. - Denies numbness and tingling in her legs - Dr. Angelia Mould, podiatry.  She also has a history of asthma, and is on frequent prednisone tapers, last around 02/2012; also HTN;  h/o nephrolithiasis, s/p lithotripsy; GERD.  ROS: Constitutional: no weight gain/loss, no fatigue, no subjective hyperthermia/hypothermia, + nocturia Eyes: + blurry vision, no xerophthalmia ENT: no sore throat, no nodules palpated in throat, no dysphagia/odynophagia, no hoarseness Cardiovascular: no CP/SOB/palpitations/leg swelling Respiratory: no cough/SOB Gastrointestinal: no N/V/D/C Musculoskeletal: + muscle aches/no joint aches Skin: no rashes Neurological: no tremors/numbness/tingling/dizziness  I reviewed pt's medications, allergies, PMH, social hx, family hx and no changes required, except as mentioned above.  PE: BP 122/70  Pulse  63  Temp(Src) 98.8 F (37.1 C) (Oral)  Resp 12  Wt 136 lb (61.689 kg)  SpO2 98% Wt Readings from Last 3 Encounters:  08/16/13 136 lb (61.689 kg)  07/10/13 133 lb 12.8 oz (60.691 kg)  02/20/13 138 lb (62.596 kg)   Constitutional: normal weight, in NAD, looking younger than stated age Eyes: PERRLA, EOMI, no exophthalmos ENT: moist mucous membranes, no thyromegaly, no cervical lymphadenopathy Cardiovascular: RRR, No MRG Respiratory: CTA B Gastrointestinal: abdomen soft, TTP in LLQ, ND, BS+ Musculoskeletal: no deformities, strength intact in all 4 Skin:  moist, warm, no rashes  ASSESSMENT: 1. DM2, insulin-dependent, uncontrolled, without complications  PLAN:  1. Patient with improved diabetes control after starting to follow the recommendations to take her mealtime insulin 3x a day. She still has few spikes in the 200s, but overall, her sugars are mostly at goal. - I advised her to stay on the current regimen for now: Patient Instructions  Continue: - Metformin 500 mg 2x a day - Levemir 40 units at night - Humalog 12 units before meals Please return in 2 months. - she has Metformin from [employer] >> tolerates this well - we will need a SSI at next visit - sent Rx for reliOn meter, strips and lancets to her pharmacy - also refilled her insulins - I will see the patient back in 2 mo with her sugar log

## 2013-08-17 ENCOUNTER — Encounter (INDEPENDENT_AMBULATORY_CARE_PROVIDER_SITE_OTHER): Payer: Medicare Other | Admitting: Ophthalmology

## 2013-08-17 DIAGNOSIS — E1139 Type 2 diabetes mellitus with other diabetic ophthalmic complication: Secondary | ICD-10-CM

## 2013-08-17 DIAGNOSIS — E11319 Type 2 diabetes mellitus with unspecified diabetic retinopathy without macular edema: Secondary | ICD-10-CM

## 2013-08-17 DIAGNOSIS — E1165 Type 2 diabetes mellitus with hyperglycemia: Secondary | ICD-10-CM

## 2013-08-17 DIAGNOSIS — H35039 Hypertensive retinopathy, unspecified eye: Secondary | ICD-10-CM

## 2013-08-17 DIAGNOSIS — H43819 Vitreous degeneration, unspecified eye: Secondary | ICD-10-CM

## 2013-08-17 DIAGNOSIS — H3540 Unspecified peripheral retinal degeneration: Secondary | ICD-10-CM

## 2013-08-17 DIAGNOSIS — H35359 Cystoid macular degeneration, unspecified eye: Secondary | ICD-10-CM

## 2013-08-17 DIAGNOSIS — I10 Essential (primary) hypertension: Secondary | ICD-10-CM

## 2013-08-22 NOTE — Telephone Encounter (Signed)
Left another message for pt to call if she is still in need of speaking to MullinsRachel or myself

## 2013-08-22 NOTE — Telephone Encounter (Signed)
Several messages left for pt to call the office

## 2013-09-11 ENCOUNTER — Telehealth: Payer: Self-pay | Admitting: Family

## 2013-09-11 NOTE — Telephone Encounter (Signed)
Appointment scheduled.

## 2013-09-11 NOTE — Telephone Encounter (Signed)
Patient Information:  Caller Name: Mackey Birchwoodaulette  Phone: [phone]  Patient: Katrina Garrison, Katrina Garrison  Gender: Female  DOB: 1940/06/27  Age: [age] Years  PCP: Adline Mangoampbell, [facility](Family Practice)  Office Follow Up:  Does the office need to follow up with this patient?: Yes  Instructions For The Office: Please call to advise if MD approves office visit for 09/12/13 for see in 4 hour disposition.  RN Note:  Called from work.  History of diverticulosis. FBS not tested. Not able to sleep at night due to abdominal pain.  Colonoscopy 03/31/13.  Aleve only helps abdominal pain for about 1 hour.  Reports abdominal pain rated 5/10 now.  Advised to see MD within 4 hours for constant abdominal pain per office protocol for pts with ED dispositions when office is open and appointment available within 4 hours.  Unable to leave work now and coworker leaves at 1500.  Requests appointment after 1730 due to work schedule or on 09/12/13.  Informed office is not open until 1730 and reinforced needs to be seen within 4 hours per MD protocol.  [facility]  Please call back to advise if MD approves waiting for appointment for 09/12/13.    Symptoms  Reason For Call & Symptoms: Increasing painlful lower abdominal pain at times worse on left. Intermittent mild vomiting 09/10/13.  Episodes of diarrhea followed by constipation. Last diarrhea was 09/07/13.  Last BM 09/09/13 with scant rectal bleeding when wipes she related to hemorrhoids.  Reviewed Health History In EMR: Yes  Reviewed Medications In EMR: Yes  Reviewed Allergies In EMR: No  Reviewed Surgeries / Procedures: Yes  Date of Onset of Symptoms: 08/28/2013  Treatments Tried: Ginger tea, peppermint tea, MOM, drinking green tea, Aleve  Treatments Tried Worked: No  Guideline(s) Used:  Abdominal Pain - Female  Disposition Per Guideline:   Go to ED Now (or to Office with PCP Approval)  Reason For Disposition Reached:   Constant abdominal pain  lasting > 2 hours  Advice Given:  N/A  Patient Refused Recommendation:  Patient Refused Care Advice  Requests appointment after 1730  because she is at work or first thing 09/12/13.

## 2013-09-12 ENCOUNTER — Ambulatory Visit (INDEPENDENT_AMBULATORY_CARE_PROVIDER_SITE_OTHER): Payer: Medicare Other | Admitting: Family

## 2013-09-12 ENCOUNTER — Other Ambulatory Visit: Payer: Self-pay | Admitting: *Deleted

## 2013-09-12 ENCOUNTER — Telehealth: Payer: Self-pay | Admitting: Family

## 2013-09-12 ENCOUNTER — Encounter: Payer: Self-pay | Admitting: Family

## 2013-09-12 VITALS — BP 140/80 | HR 68 | Temp 100.1°F | Ht 62.0 in | Wt 121.0 lb

## 2013-09-12 DIAGNOSIS — I10 Essential (primary) hypertension: Secondary | ICD-10-CM

## 2013-09-12 DIAGNOSIS — E1165 Type 2 diabetes mellitus with hyperglycemia: Secondary | ICD-10-CM

## 2013-09-12 DIAGNOSIS — K5732 Diverticulitis of large intestine without perforation or abscess without bleeding: Secondary | ICD-10-CM

## 2013-09-12 DIAGNOSIS — IMO0001 Reserved for inherently not codable concepts without codable children: Secondary | ICD-10-CM

## 2013-09-12 DIAGNOSIS — E109 Type 1 diabetes mellitus without complications: Secondary | ICD-10-CM

## 2013-09-12 LAB — CBC WITH DIFFERENTIAL/PLATELET
BASOS PCT: 0 % (ref 0.0–3.0)
Basophils Absolute: 0 10*3/uL (ref 0.0–0.1)
EOS ABS: 0 10*3/uL (ref 0.0–0.7)
Eosinophils Relative: 0.2 % (ref 0.0–5.0)
HCT: 34.2 % — ABNORMAL LOW (ref 36.0–46.0)
HEMOGLOBIN: 11.2 g/dL — AB (ref 12.0–15.0)
Lymphocytes Relative: 8.2 % — ABNORMAL LOW (ref 12.0–46.0)
Lymphs Abs: 1.2 10*3/uL (ref 0.7–4.0)
MCHC: 32.8 g/dL (ref 30.0–36.0)
MCV: 81.7 fl (ref 78.0–100.0)
MONO ABS: 0.7 10*3/uL (ref 0.1–1.0)
Monocytes Relative: 4.6 % (ref 3.0–12.0)
NEUTROS ABS: 12.6 10*3/uL — AB (ref 1.4–7.7)
Neutrophils Relative %: 87 % — ABNORMAL HIGH (ref 43.0–77.0)
Platelets: 266 10*3/uL (ref 150.0–400.0)
RBC: 4.18 Mil/uL (ref 3.87–5.11)
RDW: 15.2 % (ref 11.5–15.5)
WBC: 14.5 10*3/uL — ABNORMAL HIGH (ref 4.0–10.5)

## 2013-09-12 MED ORDER — CEFTRIAXONE SODIUM 1 G IJ SOLR
1.0000 g | Freq: Once | INTRAMUSCULAR | Status: AC
  Administered 2013-09-12: 1 g via INTRAMUSCULAR

## 2013-09-12 MED ORDER — CEFTRIAXONE SODIUM 1 G IJ SOLR: 1.0000 g | INTRAMUSCULAR | Status: DC

## 2013-09-12 MED ORDER — TRAMADOL HCL 50 MG PO TABS: 50.0000 mg | ORAL_TABLET | Freq: Three times a day (TID) | ORAL | Status: DC | PRN

## 2013-09-12 MED ORDER — CIPROFLOXACIN HCL 500 MG PO TABS: 500.0000 mg | ORAL_TABLET | Freq: Two times a day (BID) | ORAL | Status: DC

## 2013-09-12 MED ORDER — INSULIN LISPRO 100 UNIT/ML (KWIKPEN): PEN_INJECTOR | SUBCUTANEOUS | Status: DC

## 2013-09-12 MED ORDER — METRONIDAZOLE 500 MG PO TABS: 500.0000 mg | ORAL_TABLET | Freq: Two times a day (BID) | ORAL | Status: DC

## 2013-09-12 MED ORDER — INSULIN DETEMIR 100 UNIT/ML FLEXPEN: 40.0000 [IU] | PEN_INJECTOR | Freq: Every day | SUBCUTANEOUS | Status: DC

## 2013-09-12 NOTE — Telephone Encounter (Signed)
Pt would like to know if you will order her some pain med. [facility]/ [address]

## 2013-09-12 NOTE — Progress Notes (Signed)
Pre visit review using our clinic review tool, if applicable. No additional management support is needed unless otherwise documented below in the visit note. 

## 2013-09-12 NOTE — Patient Instructions (Signed)

## 2013-09-12 NOTE — Telephone Encounter (Signed)
Patient requests samples. 2 pens Levemir and Humalog given.

## 2013-09-12 NOTE — Telephone Encounter (Signed)
Pt aware Rx phoned in to pharmacy

## 2013-09-12 NOTE — Telephone Encounter (Signed)
Please advise 

## 2013-09-12 NOTE — Progress Notes (Signed)
Subjective:    Patient ID: Katrina Garrison, female    DOB: 04-17-1940, 73 y.o.   MRN: [id number]  HPI 73 year old African American female, nonsmoker, with a history of hypertension, GERD, and diverticulosis is in today with complaints of left lower quadrant pain, nausea, vomiting, and diarrhea x 3 days and worsening. Pain is 8/10. Defecates normally. Passes gas. Has been eating clear liquids that helps.    Review of Systems  Constitutional: Negative.   HENT: Negative.   Respiratory: Negative.   Cardiovascular: Negative.   Gastrointestinal: Positive for abdominal pain. Negative for diarrhea, constipation, blood in stool and abdominal distention.  Endocrine: Negative.   Genitourinary: Negative.   Musculoskeletal: Negative.   Skin: Negative.   Neurological: Negative.   Hematological: Negative.   Psychiatric/Behavioral: Negative.    Past Medical History  Diagnosis Date  . Diabetes mellitus type II 2005  . GERD (gastroesophageal reflux disease)   . Hypertension   . History of lithotripsy     x3 for kidney stones  . Diverticulosis   . Anemia   . Diverticulitis     History   Social History  . Marital Status: Divorced    Spouse Name: N/A    Number of Children: N/A  . Years of Education: N/A   Occupational History  . Not on file.   Social History Main Topics  . Smoking status: Former Smoker -- 1.5 years    Quit date: 01/28/1985  . Smokeless tobacco: Not on file  . Alcohol Use: No  . Drug Use: No  . Sexual Activity: Not on file   Other Topics Concern  . Not on file   Social History Narrative  . No narrative on file    Past Surgical History  Procedure Laterality Date  . Abdominal hysterectomy    . Childbirth x 2    . Eye surgery      cataract    Family History  Problem Relation Age of Onset  . Throat cancer Mother   . Diabetes Mother   . Hypertension Mother   . Alcohol abuse Father   . Hypertension Father   . Multiple sclerosis Sister   . Multiple  sclerosis Sister     Allergies  Allergen Reactions  . Metformin And Related Diarrhea  . Sulfamethoxazole     REACTION: "comatose"    Current Outpatient Prescriptions on File Prior to Visit  Medication Sig Dispense Refill  . amLODipine (NORVASC) 5 MG tablet       . Blood Glucose Monitoring Suppl (RELION CONFIRM GLUCOSE MONITOR) W/DEVICE KIT Use as advised  1 kit  1  . glucose blood (RELION GLUCOSE TEST STRIPS) test strip Check sugars 3x a day  200 each  3  . hydrocortisone (ANUSOL-HC) 25 MG suppository Place 1 suppository (25 mg total) rectally 2 (two) times daily as needed for hemorrhoids (for bright red rectal bleeding.).  12 suppository  0  . ibuprofen (ADVIL,MOTRIN) 200 MG tablet Take 200 mg by mouth every 6 (six) hours as needed.        Marland Kitchen ketorolac (ACULAR) 0.5 % ophthalmic solution       . lisinopril (PRINIVIL,ZESTRIL) 20 MG tablet Take 1 tablet (20 mg total) by mouth daily.  90 tablet  1  . metFORMIN (GLUCOPHAGE) 500 MG tablet Take 2 tablets (1,000 mg total) by mouth daily with supper.  180 tablet  3  . ofloxacin (OCUFLOX) 0.3 % ophthalmic solution       . RELION LANCETS MICRO-THIN 33G MISC  Check sugars 3x a day  200 each  3  . traMADol (ULTRAM) 50 MG tablet Take 1 tablet (50 mg total) by mouth every 8 (eight) hours as needed.  30 tablet  0   No current facility-administered medications on file prior to visit.    BP 140/80  Pulse 68  Temp(Src) 100.1 F (37.8 C) (Oral)  Ht 5' 2" (1.575 m)  Wt 121 lb (54.885 kg)  BMI 22.13 kg/m2chart     Objective:   Physical Exam  Constitutional: She is oriented to person, place, and time. She appears well-developed.  HENT:  Right Ear: External ear normal.  Left Ear: External ear normal.  Nose: Nose normal.  Mouth/Throat: Oropharynx is clear and moist.  Neck: Normal range of motion. Neck supple.  Cardiovascular: Normal rate, regular rhythm and normal heart sounds.   Pulmonary/Chest: Effort normal and breath sounds normal.    Abdominal: Soft. Bowel sounds are normal.  Musculoskeletal: Normal range of motion.  Neurological: She is alert and oriented to person, place, and time.  Skin: Skin is warm and dry.  Psychiatric: She has a normal mood and affect.          Assessment & Plan:  Katrina Garrison was seen today for abdominal pain.  Diagnoses and associated orders for this visit:  Diverticulitis of colon without hemorrhage - CBC with Differential - Discontinue: cefTRIAXone (ROCEPHIN) injection 1 g; Inject 1 g into the muscle daily. - cefTRIAXone (ROCEPHIN) injection 1 g; Inject 1 g into the muscle once.  Type II or unspecified type diabetes mellitus without mention of complication, uncontrolled  Essential hypertension, benign  Other Orders - ciprofloxacin (CIPRO) 500 MG tablet; Take 1 tablet (500 mg total) by mouth 2 (two) times daily. - metroNIDAZOLE (FLAGYL) 500 MG tablet; Take 1 tablet (500 mg total) by mouth 2 (two) times daily.   Call the office with any questions or concerns. Recheck pending results of CBC and sooner as needed.

## 2013-09-13 ENCOUNTER — Telehealth: Payer: Self-pay | Admitting: Family

## 2013-09-13 ENCOUNTER — Telehealth: Payer: Self-pay

## 2013-09-13 NOTE — Telephone Encounter (Signed)
Pt states that she is feeling just a little bit better today. She has started the medication that Rx'd and will callback Friday to let us know how she is doing

## 2013-09-13 NOTE — Telephone Encounter (Signed)
Relevant patient education assigned to patient using Emmi. ° °

## 2013-09-18 NOTE — Telephone Encounter (Signed)
Left message to check on pt's well being

## 2013-09-19 ENCOUNTER — Telehealth: Payer: Self-pay | Admitting: Family

## 2013-09-19 NOTE — Telephone Encounter (Signed)
Noted  

## 2013-09-19 NOTE — Telephone Encounter (Signed)
Katrina Garrison called and she wanted me to tell [facility] YOU!!! She said she is feeling so much better.Marland KitchenMarland KitchenMarland Kitchen

## 2013-09-28 ENCOUNTER — Encounter (INDEPENDENT_AMBULATORY_CARE_PROVIDER_SITE_OTHER): Payer: Medicare Other | Admitting: Ophthalmology

## 2013-10-06 ENCOUNTER — Encounter: Payer: Self-pay | Admitting: Family

## 2013-10-06 ENCOUNTER — Other Ambulatory Visit: Payer: Self-pay | Admitting: Family

## 2013-10-06 ENCOUNTER — Ambulatory Visit (INDEPENDENT_AMBULATORY_CARE_PROVIDER_SITE_OTHER): Payer: Medicare Other | Admitting: Family

## 2013-10-06 VITALS — BP 160/70 | HR 98 | Ht 61.0 in | Wt 135.0 lb

## 2013-10-06 DIAGNOSIS — Z23 Encounter for immunization: Secondary | ICD-10-CM

## 2013-10-06 DIAGNOSIS — E1165 Type 2 diabetes mellitus with hyperglycemia: Secondary | ICD-10-CM

## 2013-10-06 DIAGNOSIS — I10 Essential (primary) hypertension: Secondary | ICD-10-CM

## 2013-10-06 DIAGNOSIS — Z1231 Encounter for screening mammogram for malignant neoplasm of breast: Secondary | ICD-10-CM

## 2013-10-06 DIAGNOSIS — E785 Hyperlipidemia, unspecified: Secondary | ICD-10-CM

## 2013-10-06 DIAGNOSIS — Z0000 Encounter for general adult medical examination without abnormal findings: Secondary | ICD-10-CM

## 2013-10-06 DIAGNOSIS — Z1239 Encounter for other screening for malignant neoplasm of breast: Secondary | ICD-10-CM

## 2013-10-06 DIAGNOSIS — IMO0001 Reserved for inherently not codable concepts without codable children: Secondary | ICD-10-CM

## 2013-10-06 LAB — POCT URINALYSIS DIPSTICK
Bilirubin, UA: NEGATIVE
Glucose, UA: NEGATIVE
Ketones, UA: NEGATIVE
Leukocytes, UA: NEGATIVE
NITRITE UA: NEGATIVE
PH UA: 7
Protein, UA: NEGATIVE
RBC UA: NEGATIVE
Spec Grav, UA: 1.02
Urobilinogen, UA: 0.2

## 2013-10-06 LAB — HEPATIC FUNCTION PANEL
ALBUMIN: 3.9 g/dL (ref 3.5–5.2)
ALK PHOS: 91 U/L (ref 39–117)
ALT: 15 U/L (ref 0–35)
AST: 22 U/L (ref 0–37)
Bilirubin, Direct: 0.1 mg/dL (ref 0.0–0.3)
Total Bilirubin: 0.7 mg/dL (ref 0.2–1.2)
Total Protein: 8 g/dL (ref 6.0–8.3)

## 2013-10-06 LAB — CBC WITH DIFFERENTIAL/PLATELET
Basophils Absolute: 0 10*3/uL (ref 0.0–0.1)
Basophils Relative: 0.2 % (ref 0.0–3.0)
EOS ABS: 0.1 10*3/uL (ref 0.0–0.7)
Eosinophils Relative: 1.4 % (ref 0.0–5.0)
HEMATOCRIT: 37.8 % (ref 36.0–46.0)
Hemoglobin: 12.2 g/dL (ref 12.0–15.0)
Lymphocytes Relative: 23.7 % (ref 12.0–46.0)
Lymphs Abs: 1.7 10*3/uL (ref 0.7–4.0)
MCHC: 32.3 g/dL (ref 30.0–36.0)
MCV: 83.7 fl (ref 78.0–100.0)
MONO ABS: 0.4 10*3/uL (ref 0.1–1.0)
Monocytes Relative: 5.6 % (ref 3.0–12.0)
Neutro Abs: 4.9 10*3/uL (ref 1.4–7.7)
Neutrophils Relative %: 69.1 % (ref 43.0–77.0)
PLATELETS: 246 10*3/uL (ref 150.0–400.0)
RBC: 4.52 Mil/uL (ref 3.87–5.11)
RDW: 16.9 % — AB (ref 11.5–15.5)
WBC: 7.1 10*3/uL (ref 4.0–10.5)

## 2013-10-06 LAB — BASIC METABOLIC PANEL
BUN: 12 mg/dL (ref 6–23)
CALCIUM: 9.7 mg/dL (ref 8.4–10.5)
CO2: 30 mEq/L (ref 19–32)
Chloride: 106 mEq/L (ref 96–112)
Creatinine, Ser: 0.6 mg/dL (ref 0.4–1.2)
GFR: 131.21 mL/min (ref >60.00)
GLUCOSE: 131 mg/dL — AB (ref 70–99)
Potassium: 4.7 mEq/L (ref 3.5–5.1)
Sodium: 141 mEq/L (ref 135–145)

## 2013-10-06 LAB — LIPID PANEL
Cholesterol: 124 mg/dL (ref 0–200)
HDL: 48.9 mg/dL (ref >39.00)
LDL Cholesterol: 65 mg/dL (ref 0–99)
NONHDL: 75.1
Total CHOL/HDL Ratio: 3
Triglycerides: 53 mg/dL (ref 0.0–149.0)
VLDL: 10.6 mg/dL (ref 0.0–40.0)

## 2013-10-06 LAB — HEMOGLOBIN A1C: HEMOGLOBIN A1C: 7.4 % — AB (ref 4.6–6.5)

## 2013-10-06 LAB — TSH: TSH: 0.94 u[IU]/mL (ref 0.35–4.50)

## 2013-10-06 NOTE — Patient Instructions (Signed)
Diabetes and Exercise Diabetes mellitus is a common, chronic disease, in which the pancreas is unable to adequately control blood glucose (sugar) levels. There are 2 types of diabetes. Type 1 diabetes patients are unable to produce insulin, a hormone that causes sugar in the blood to be stored in the body. People with type 1 diabetes may compensate by giving themselves injections of insulin. Type 2 diabetes involves not producing adequate amounts of insulin to control blood glucose levels. People with type 2 diabetes control their blood glucose by monitoring their food intake or by taking medicine. Exercise is an important part of diabetes treatment. During exercise, the muscles use a greater amount of glucose from the blood for energy. This lowers your blood glucose, which is the same effect you would get from taking insulin. It has been shown that endurance athletes are more sensitive to insulin than inactive people. SYMPTOMS  Many people with a mild case of diabetes have no symptoms. However, if left uncontrolled, diabetes can lead to several complications that could be prevented with treatment of the disease. General symptoms of diabetes include:   Frequent urination (polyuria).  Frequent thirst and drinking (polydipsia).  Increased food consumption (polyphagia).  Fatigue.  Poor exercise performance.  Blurred vision.  Inflammation of the vagina (vaginitis) caused by fungal infections.  Skin infections (uncommon).  Numbness in the feet, caused by nerve injury.  Kidney disease. CAUSES  The cause of most cases of diabetes is unknown. In children, diabetes is often due to an autoimmune response to the cells in the pancreas that make insulin. It is also linked with other diseases, such as cystic fibrosis. Diabetes may have a genetic link. PREVENTION  Athletes should strive to begin exercise with blood glucose in a well-controlled state.  Feet should always be kept clean and  dry.  Activities in which low blood sugar levels cannot be treated easily (scuba diving, rock climbing, swimming) should be avoided.  Anticipate alterations in diet or training to avoid low blood sugar (hypoglycemia) and high blood sugar (hyperglycemia).  Athletes should try to increase sugar consumption after strenuous exercise to avoid hypoglycemia.  Short-acting insulin should not be injected into an actively exercising muscle. The athlete should rest the injection site for about 1 hour after exercise.  Patients with diabetes should get routine checkups of the feet to prevent complications. PROGNOSIS  Exercise provides many benefits to the person with diabetes:   Reduced body fat.  Lower blood pressure.  Often, reduced need for medicines.  Improved exercise tolerance.  Lower insulin levels.  Weight loss.  Improved lipid profile (decreased cholesterol and low-density lipoproteins). RELATED COMPLICATIONS  If performed incorrectly, exercise can result in complications of diabetes:   Poor control of blood sugar, when exercise is performed at the wrong time.  Increase in renal disease, from loss of body fluids (dehydration).  Increased risk of nerve injury (neuropathy) when performing exercises that increase foot injury.  Increased risk of eye problems when performing activities that involve breath holding or lowering or jarring the head.  Increased risk of sudden death from exercise in patients with heart disease.  Worsening of hypertension with heavy lifting (more than 10 lb/4.5 kg). Altered blood glucose and insulin dose as a result of mild illness that produces loss of appetite.  Altered uptake of insulin after injection when insulin injection site is changed. NOTE: Exercise can lower blood glucose effectively, but the effects are short-lasting (no more than a couple of days). Exercise has been shown to   improve your sensitivity to insulin. This may alter how your body  responds to a given dose of injected insulin. It is important for every patient with diabetes to know how his or her body may react to exercise, and to adjust insulin dosages accordingly. TREATMENT  Eat about 1 to 3 hours before exercise.  Check blood glucose immediately before and after exercise.  Stop exercise if blood glucose is more than 250 mg/dL.  Stop exercise if blood glucose is less than 100 mg/dL.  Do not exercise within 1 hour of an insulin injection.  Be prepared to treat low blood glucose while exercising. Keep some sugar product with you, such as a candy bar.  For prolonged exercise, use a sports drink to maintain your glucose level.  Replace used-up glucose in the body after exercise.  Consume fluids during and after exercise to avoid dehydration. SEEK MEDICAL CARE IF:  You have vision changes after a run.  You notice a loss of sensation in your feet after exercise.  You have increased numbness, tingling, or pins and needles sensations after exercise.  You have chest pain during or after exercise.  You have a fast, irregular heartbeat (palpitations) during or after exercise.  Your exercise tolerance gets worse.  You have fainting or dizzy spells for brief periods during or after exercise. Document Released: 01/12/2005 Document Revised: 05/29/2013 Document Reviewed: 04/26/2008 ExitCare Patient Information 2015 [employer]. This information is not intended to replace advice given to you by your health care provider. Make sure you discuss any questions you have with your health care provider.  

## 2013-10-06 NOTE — Progress Notes (Signed)
Subjective:    Patient ID: Katrina Garrison, female    DOB: Aug 04, 1940, 73 y.o.   MRN: [id number]  HPI  73 year old African American female, nonsmoker with a history of type 2 diabetes, hypertension, dyslipidemia is in today for complete physical exam. Last hemoglobin A1c was 11.0. She is under the care of endocrinology. Takes Levemir 40 units at bedtime and 12 units of Humalog with each meal. Managed by Dr. Margreta Journey.  This is a routine wellness  examination for this patient. I reviewed all health maintenance protocols including mammography, colonoscopy, bone density Needed referrals were placed. Age and diagnosis  appropriate screening labs were ordered. Her immunization history was reviewed and appropriate vaccinations were ordered. Her current medications and allergies were reviewed and needed refills of her chronic medications were ordered. The plan for yearly health maintenance was discussed all orders and referrals were made as appropriate.  Review of Systems  Constitutional: Negative.   HENT: Negative.   Eyes: Negative.   Respiratory: Negative.   Cardiovascular: Negative.   Gastrointestinal: Negative.   Endocrine: Negative.   Genitourinary: Negative.   Musculoskeletal: Negative.   Skin: Negative.   Allergic/Immunologic: Negative.   Neurological: Negative.   Hematological: Negative.   Psychiatric/Behavioral: Negative.    Past Medical History  Diagnosis Date  . Diabetes mellitus type II 2005  . GERD (gastroesophageal reflux disease)   . Hypertension   . History of lithotripsy     x3 for kidney stones  . Diverticulosis   . Anemia   . Diverticulitis     History   Social History  . Marital Status: Divorced    Spouse Name: N/A    Number of Children: N/A  . Years of Education: N/A   Occupational History  . Not on file.   Social History Main Topics  . Smoking status: Former Smoker -- 1.5 years    Quit date: 01/28/1985  . Smokeless tobacco: Not on file  .  Alcohol Use: No  . Drug Use: No  . Sexual Activity: Not on file   Other Topics Concern  . Not on file   Social History Narrative  . No narrative on file    Past Surgical History  Procedure Laterality Date  . Abdominal hysterectomy    . Childbirth x 2    . Eye surgery      cataract    Family History  Problem Relation Age of Onset  . Throat cancer Mother   . Diabetes Mother   . Hypertension Mother   . Alcohol abuse Father   . Hypertension Father   . Multiple sclerosis Sister   . Multiple sclerosis Sister     Allergies  Allergen Reactions  . Metformin And Related Diarrhea  . Sulfamethoxazole     REACTION: "comatose"    Current Outpatient Prescriptions on File Prior to Visit  Medication Sig Dispense Refill  . Blood Glucose Monitoring Suppl (RELION CONFIRM GLUCOSE MONITOR) W/DEVICE KIT Use as advised  1 kit  1  . glucose blood (RELION GLUCOSE TEST STRIPS) test strip Check sugars 3x a day  200 each  3  . hydrocortisone (ANUSOL-HC) 25 MG suppository Place 1 suppository (25 mg total) rectally 2 (two) times daily as needed for hemorrhoids (for bright red rectal bleeding.).  12 suppository  0  . ibuprofen (ADVIL,MOTRIN) 200 MG tablet Take 200 mg by mouth every 6 (six) hours as needed.        . Insulin Detemir (LEVEMIR FLEXPEN) 100 UNIT/ML Pen Inject 40 Units  into the skin at bedtime.  2 pen  0  . insulin lispro (HUMALOG KWIKPEN) 100 UNIT/ML KiwkPen Inject 12 units a day as advised  2 pen  0  . ketorolac (ACULAR) 0.5 % ophthalmic solution       . lisinopril (PRINIVIL,ZESTRIL) 20 MG tablet Take 1 tablet (20 mg total) by mouth daily.  90 tablet  1  . metFORMIN (GLUCOPHAGE) 500 MG tablet Take 2 tablets (1,000 mg total) by mouth daily with supper.  180 tablet  3  . ofloxacin (OCUFLOX) 0.3 % ophthalmic solution       . RELION LANCETS MICRO-THIN 33G MISC Check sugars 3x a day  200 each  3  . traMADol (ULTRAM) 50 MG tablet Take 1 tablet (50 mg total) by mouth every 8 (eight) hours as  needed.  30 tablet  0   No current facility-administered medications on file prior to visit.    BP 160/70  Pulse 98  Ht '5\' 1"'  (1.549 m)  Wt 135 lb (61.236 kg)  BMI 25.52 kg/m2chart    Objective:   Physical Exam  Constitutional: She is oriented to person, place, and time. She appears well-developed and well-nourished.  HENT:  Head: Normocephalic and atraumatic.  Right Ear: External ear normal.  Left Ear: External ear normal.  Nose: Nose normal.  Mouth/Throat: Oropharynx is clear and moist.  Eyes: Conjunctivae and EOM are normal. Pupils are equal, round, and reactive to light.  Neck: Normal range of motion. Neck supple. No thyromegaly present.  Cardiovascular: Normal rate, regular rhythm and normal heart sounds.   Pulmonary/Chest: Effort normal and breath sounds normal. Right breast exhibits no inverted nipple, no mass, no nipple discharge, no skin change and no tenderness. Left breast exhibits no inverted nipple, no mass, no nipple discharge, no skin change and no tenderness. Breasts are symmetrical.  Abdominal: Soft. Bowel sounds are normal.  Musculoskeletal: Normal range of motion.  Neurological: She is alert and oriented to person, place, and time. She has normal reflexes.  Skin: Skin is warm and dry.  Psychiatric: She has a normal mood and affect.          Assessment & Plan:  Katrina Garrison was seen today for no specified reason.  Diagnoses and associated orders for this visit:  Preventative health care - POC Urinalysis Dipstick - EKG 12-Lead  Type II or unspecified type diabetes mellitus without mention of complication, uncontrolled - Hemoglobin U3J - Basic Metabolic Panel - Hepatic Function Panel - Lipid Panel - CBC with Differential - TSH - EKG 12-Lead  Unspecified essential hypertension - Basic Metabolic Panel - Hepatic Function Panel - Lipid Panel - CBC with Differential - EKG 12-Lead  Dyslipidemia - Basic Metabolic Panel - Hepatic Function  Panel - Lipid Panel - CBC with Differential - TSH - EKG 12-Lead  Breast cancer screening, high risk patient - MM Digital Screening; Future  Other Orders - Pneumococcal polysaccharide vaccine 23-valent greater than or equal to 2yo subcutaneous/IM   Call the office with any questions. Recheck as scheduled in 6 months, follow-up with endocrinology and sooner as needed.

## 2013-10-06 NOTE — Progress Notes (Signed)
Pre visit review using our clinic review tool, if applicable. No additional management support is needed unless otherwise documented below in the visit note. 

## 2013-10-19 ENCOUNTER — Ambulatory Visit: Payer: 59 | Admitting: Internal Medicine

## 2013-10-25 ENCOUNTER — Ambulatory Visit
Admission: RE | Admit: 2013-10-25 | Discharge: 2013-10-25 | Disposition: A | Discharge status: Home / Self Care | Payer: Medicare Other | Source: Ambulatory Visit | Attending: Family | Admitting: Family

## 2013-10-25 DIAGNOSIS — Z1239 Encounter for other screening for malignant neoplasm of breast: Secondary | ICD-10-CM

## 2013-10-27 ENCOUNTER — Ambulatory Visit: Payer: Medicare Other

## 2013-11-23 ENCOUNTER — Ambulatory Visit (INDEPENDENT_AMBULATORY_CARE_PROVIDER_SITE_OTHER): Payer: Medicare Other | Admitting: Family

## 2013-11-23 ENCOUNTER — Encounter: Payer: Self-pay | Admitting: Family

## 2013-11-23 VITALS — BP 148/92 | HR 86 | Ht 61.0 in | Wt 135.6 lb

## 2013-11-23 DIAGNOSIS — E785 Hyperlipidemia, unspecified: Secondary | ICD-10-CM | POA: Insufficient documentation

## 2013-11-23 DIAGNOSIS — I10 Essential (primary) hypertension: Secondary | ICD-10-CM

## 2013-11-23 DIAGNOSIS — E1165 Type 2 diabetes mellitus with hyperglycemia: Secondary | ICD-10-CM

## 2013-11-23 DIAGNOSIS — Z23 Encounter for immunization: Secondary | ICD-10-CM

## 2013-11-23 DIAGNOSIS — E663 Overweight: Secondary | ICD-10-CM | POA: Insufficient documentation

## 2013-11-23 DIAGNOSIS — IMO0002 Reserved for concepts with insufficient information to code with codable children: Secondary | ICD-10-CM

## 2013-11-23 NOTE — Progress Notes (Signed)
Pre visit review using our clinic review tool, if applicable. No additional management support is needed unless otherwise documented below in the visit note. 

## 2013-11-23 NOTE — Progress Notes (Signed)
Subjective:    Patient ID: Katrina Garrison, female    DOB: Sep 11, 1940, 73 y.o.   MRN: [id number]  Abdominal Pain Associated symptoms include diarrhea. Pertinent negatives include no constipation.   73 year old African-American female, nonsmoker is in today with concerns of abdominal cramping which she takes metformin. Patient has been on metformin for several months and only takes it when she is able to stand the house due to abdominal cramping and diarrhea. Averages about one dose per week. Is requesting to discontinue metformin and try dietary and lifestyle modifications of the next couple months to decrease her blood sugar. She currently takes the Levemir and Humalog and tolerates it well. She has taken red meat out of her diet. Has a history of diverticulitis had a normal colonoscopy in March 2015.   Review of Systems  Constitutional: Negative.   HENT: Negative.   Respiratory: Negative.   Cardiovascular: Negative.   Gastrointestinal: Positive for abdominal pain and diarrhea. Negative for constipation, blood in stool and rectal pain.  Endocrine: Negative.   Genitourinary: Negative.   Musculoskeletal: Negative.   Skin: Negative.   Allergic/Immunologic: Negative.   Neurological: Negative.   Hematological: Negative.   Psychiatric/Behavioral: Negative.    Past Medical History  Diagnosis Date  . Diabetes mellitus type II 2005  . GERD (gastroesophageal reflux disease)   . Hypertension   . History of lithotripsy     x3 for kidney stones  . Diverticulosis   . Anemia   . Diverticulitis     History   Social History  . Marital Status: Divorced    Spouse Name: N/A    Number of Children: N/A  . Years of Education: N/A   Occupational History  . Not on file.   Social History Main Topics  . Smoking status: Former Smoker -- 1.5 years    Quit date: 01/28/1985  . Smokeless tobacco: Not on file  . Alcohol Use: No  . Drug Use: No  . Sexual Activity: Not on file   Other  Topics Concern  . Not on file   Social History Narrative  . No narrative on file    Past Surgical History  Procedure Laterality Date  . Abdominal hysterectomy    . Childbirth x 2    . Eye surgery      cataract    Family History  Problem Relation Age of Onset  . Throat cancer Mother   . Diabetes Mother   . Hypertension Mother   . Alcohol abuse Father   . Hypertension Father   . Multiple sclerosis Sister   . Multiple sclerosis Sister     Allergies  Allergen Reactions  . Metformin And Related Diarrhea  . Sulfamethoxazole     REACTION: "comatose"    Current Outpatient Prescriptions on File Prior to Visit  Medication Sig Dispense Refill  . Blood Glucose Monitoring Suppl (RELION CONFIRM GLUCOSE MONITOR) W/DEVICE KIT Use as advised  1 kit  1  . glucose blood (RELION GLUCOSE TEST STRIPS) test strip Check sugars 3x a day  200 each  3  . hydrocortisone (ANUSOL-HC) 25 MG suppository Place 1 suppository (25 mg total) rectally 2 (two) times daily as needed for hemorrhoids (for bright red rectal bleeding.).  12 suppository  0  . ibuprofen (ADVIL,MOTRIN) 200 MG tablet Take 200 mg by mouth every 6 (six) hours as needed.        . Insulin Detemir (LEVEMIR FLEXPEN) 100 UNIT/ML Pen Inject 40 Units into the skin at bedtime.  2 pen  0  . insulin lispro (HUMALOG KWIKPEN) 100 UNIT/ML KiwkPen Inject 12 units a day as advised  2 pen  0  . ketorolac (ACULAR) 0.5 % ophthalmic solution       . lisinopril (PRINIVIL,ZESTRIL) 20 MG tablet Take 1 tablet (20 mg total) by mouth daily.  90 tablet  1  . ofloxacin (OCUFLOX) 0.3 % ophthalmic solution       . RELION LANCETS MICRO-THIN 33G MISC Check sugars 3x a day  200 each  3  . traMADol (ULTRAM) 50 MG tablet Take 1 tablet (50 mg total) by mouth every 8 (eight) hours as needed.  30 tablet  0   No current facility-administered medications on file prior to visit.    BP 148/92  Pulse 86  Ht '5\' 1"'  (1.549 m)  Wt 135 lb 9.6 oz (61.508 kg)  BMI 25.63  kg/m2chart    Objective:   Physical Exam  Constitutional: She is oriented to person, place, and time. She appears well-developed and well-nourished.  HENT:  Right Ear: External ear normal.  Left Ear: External ear normal.  Nose: Nose normal.  Mouth/Throat: Oropharynx is clear and moist.  Neck: Normal range of motion. Neck supple.  Cardiovascular: Normal rate, regular rhythm and normal heart sounds.   Pulmonary/Chest: Effort normal and breath sounds normal.  Abdominal: Soft. Bowel sounds are normal.  Musculoskeletal: Normal range of motion.  Neurological: She is alert and oriented to person, place, and time.  Skin: Skin is warm and dry.  Psychiatric: She has a normal mood and affect.          Assessment & Plan:  Katrina Garrison was seen today for abdominal pain.  Diagnoses and associated orders for this visit:  DM (diabetes mellitus), type 2, uncontrolled  Need for prophylactic vaccination and inoculation against influenza  Essential hypertension  Other Orders - Flu Vaccine QUAD 36+ mos PF IM (Fluarix Quad PF)    Discontinue metformin. Continue Levemir and Humalog. Recheck in 3 months to see where her A1c is and discuss further treatment plan if necessary at that point.

## 2013-11-23 NOTE — Patient Instructions (Signed)
Diabetes and Exercise Exercising regularly is important. It is not just about losing weight. It has many health benefits, such as:  Improving your overall fitness, flexibility, and endurance.  Increasing your bone density.  Helping with weight control.  Decreasing your body fat.  Increasing your muscle strength.  Reducing stress and tension.  Improving your overall health. People with diabetes who exercise gain additional benefits because exercise:  Reduces appetite.  Improves the body's use of blood sugar (glucose).  Helps lower or control blood glucose.  Decreases blood pressure.  Helps control blood lipids (such as cholesterol and triglycerides).  Improves the body's use of the hormone insulin by:  Increasing the body's insulin sensitivity.  Reducing the body's insulin needs.  Decreases the risk for heart disease because exercising:  Lowers cholesterol and triglycerides levels.  Increases the levels of good cholesterol (such as high-density lipoproteins [HDL]) in the body.  Lowers blood glucose levels. YOUR ACTIVITY PLAN  Choose an activity that you enjoy and set realistic goals. Your health care provider or diabetes educator can help you make an activity plan that works for you. Exercise regularly as directed by your health care provider. This includes:  Performing resistance training twice a week such as push-ups, sit-ups, lifting weights, or using resistance bands.  Performing 150 minutes of cardio exercises each week such as walking, running, or playing sports.  Staying active and spending no more than 90 minutes at one time being inactive. Even short bursts of exercise are good for you. Three 10-minute sessions spread throughout the day are just as beneficial as a single 30-minute session. Some exercise ideas include:  Taking the dog for a walk.  Taking the stairs instead of the elevator.  Dancing to your favorite song.  Doing an exercise  video.  Doing your favorite exercise with a friend. RECOMMENDATIONS FOR EXERCISING WITH TYPE 1 OR TYPE 2 DIABETES   Check your blood glucose before exercising. If blood glucose levels are greater than 240 mg/dL, check for urine ketones. Do not exercise if ketones are present.  Avoid injecting insulin into areas of the body that are going to be exercised. For example, avoid injecting insulin into:  The arms when playing tennis.  The legs when jogging.  Keep a record of:  Food intake before and after you exercise.  Expected peak times of insulin action.  Blood glucose levels before and after you exercise.  The type and amount of exercise you have done.  Review your records with your health care provider. Your health care provider will help you to develop guidelines for adjusting food intake and insulin amounts before and after exercising.  If you take insulin or oral hypoglycemic agents, watch for signs and symptoms of hypoglycemia. They include:  Dizziness.  Shaking.  Sweating.  Chills.  Confusion.  Drink plenty of water while you exercise to prevent dehydration or heat stroke. Body water is lost during exercise and must be replaced.  Talk to your health care provider before starting an exercise program to make sure it is safe for you. Remember, almost any type of activity is better than none. Document Released: 04/04/2003 Document Revised: 05/29/2013 Document Reviewed: 06/21/2012 Exit[employer] Patient Information 2015 [employer]. This information is not intended to replace advice given to you by your health care provider. Make sure you discuss any questions you have with your health care provider.  

## 2013-12-06 ENCOUNTER — Telehealth: Payer: Self-pay | Admitting: Internal Medicine

## 2013-12-06 NOTE — Telephone Encounter (Signed)
Patient  ask if there's a substitute for Levemir, is there something cheaper. Her insurance Company will no longer cover Levemir. Please advise

## 2013-12-06 NOTE — Telephone Encounter (Signed)
Please read note below and advise.  

## 2013-12-06 NOTE — Telephone Encounter (Signed)
Called pt and she can come in tomorrow. Scheduled her for 4:15 pm on Thurs. Be advised.

## 2013-12-06 NOTE — Telephone Encounter (Signed)
I will need to see her first to reevaluate her control and, yes, we can switch to a cheaper insulin.Can she come on Fri?

## 2013-12-07 ENCOUNTER — Ambulatory Visit: Payer: Medicare Other | Admitting: Internal Medicine

## 2013-12-08 ENCOUNTER — Telehealth: Payer: Self-pay | Admitting: Internal Medicine

## 2013-12-08 ENCOUNTER — Encounter: Payer: Self-pay | Admitting: Internal Medicine

## 2013-12-08 ENCOUNTER — Telehealth: Payer: Self-pay | Admitting: Family

## 2013-12-08 ENCOUNTER — Ambulatory Visit (INDEPENDENT_AMBULATORY_CARE_PROVIDER_SITE_OTHER): Payer: Medicare Other | Admitting: Internal Medicine

## 2013-12-08 VITALS — BP 132/88 | Temp 98.6°F | Resp 12 | Wt 135.0 lb

## 2013-12-08 DIAGNOSIS — E119 Type 2 diabetes mellitus without complications: Secondary | ICD-10-CM

## 2013-12-08 NOTE — Telephone Encounter (Signed)
Patient  Stated that Dr Theresia Boughampble

## 2013-12-08 NOTE — Telephone Encounter (Signed)
Pt states that medic[employer] ill no longer distribute levemir. Instead of sending in the lantus for the pt Dr. Bonnita Nasutiambell is asking that we rx her toujeo instead. Please advise and call back

## 2013-12-08 NOTE — Patient Instructions (Signed)
Options for Levemir insulin: - Lantus pen - Lantus vial - NPH pen - NPH vial  Please continue Levemir 40 units at night Please change Humalog as follows: - 9 units before a small meal - 12 units before a regular meal  - 15 units before a large meal or if you have desert  Please come back for a follow-up appointment in 3 months.

## 2013-12-08 NOTE — Telephone Encounter (Signed)
Pt would like you Fleet ContrasRachel to call her. She said it was personal. [phone]

## 2013-12-08 NOTE — Telephone Encounter (Signed)
That is wonderful news! We can definitely do Toujeo, it is the best! Can use the same dose, 40 units. Please tell her to let us know when we need to call the Toujeo in - for now, she still has more Levemir.

## 2013-12-08 NOTE — Telephone Encounter (Signed)
Called pt and advised her per Dr Charlean SanfilippoGherghe's note. Pt said probably around the first of December. She is sending in her meds to her ins for cost. She has enough Levemir to last until December. Be advised.

## 2013-12-08 NOTE — Telephone Encounter (Signed)
Spoke with patient.

## 2013-12-08 NOTE — Telephone Encounter (Signed)
Please read note below and advise.  

## 2013-12-08 NOTE — Progress Notes (Signed)
Patient ID: Katrina Melteraulette [place], female   DOB: 1940/12/20, 73 y.o.   MRN: [id number]  HPI: Katrina Garrison is a 73 y.o.-year-old female, returning for f/u for DM2, dx 2009, insulin-dependent since 2012-13, uncontrolled, without chronic complications, + hospitalization for diabetic coma x 2. Last visit 3.5 mo ago.   At this visit, we need to change her Levemir to something more affordable. She still has Levemir pens to last her another month.  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  Patient is on the following regimen: - Levemir 40 units at night - Humalog 12 units before a regular meal - now 3x a day Stopped Metformin in 11/2013 for bloody stools. Stopped Januvia 100 mg daily >> b/c CP. Pt was in the past on a regimen of Humalog 75/25 pen - 50 units daily once a day - but was forgetting the insulin 1/7 days. She was taking the insulin at bedtime >> lows at night and highs, mostly in the 200s but also some in the 300s in am. She is doing better on Levemir.  She was also on Glipizide 10 bid in the past. She had severe diarrhea from metformin before.  Pt checks her sugars 2-4x a day and they are much improved - am: fluctuating from 96-250 >> 150-230 >> 207-307 >> 114-192 >> 129-296 (<200) >> 100s >> 119-130 >> 99-136, 217, 245 >> 69x1, 121-146, few 200s - 2 hours after breakfast: 356 >> 161 and 186 >> 187 >> n/c >> 123 - before lunch: upper 100s-up to 200 >> 204 >> 143 and 220 >> 123-146 >> 130s >> 120-130 >> 113-142. 227, 245 >> 72-143 - 2h after lunch: 161 >> n/c >> 121-152, 227x1 - before dinner: upper 100s-up to 200 >> 234-341 >> 154, 220, 354 >> 146-170 (highest 288) >> 100s >> n/c >> 102-156 >> 98-155 - after dinner: 126 >> n/c >> 134, 163, 245x1 - Bedtime >> not checking >> 223-404 >> not checking >> 124-156 >> n/c >> 112-141 >>  140-152, 217x1 1 low at 69; she has hypoglycemia awareness at 60. Highest sugar 302x1  - Pt does not have chronic kidney disease. Last BUN/Cr: Lab Results  Component Value Date   BUN 12 10/06/2013   CREATININE 0.6 10/06/2013  She is on Lisinopril. - she does not have HL. Last Lipid panel: Lab Results  Component Value Date   CHOL 124 10/06/2013   HDL 48.90 10/06/2013   LDLCALC 65 10/06/2013   TRIG 53.0 10/06/2013   CHOLHDL 3 10/06/2013  She is not on a statin. - Pt's last eye exam was in 03/2013, with Dr. Elmer PickerHecker. No DR, no macular edema. Has cataracts. Had cataract Sx OU this year. - Denies numbness and tingling in her legs - Dr. Angelia MouldAdjoury, podiatry.  She also has a history of asthma, and is on frequent prednisone tapers, last around 02/2012; also HTN;  h/o nephrolithiasis, s/p lithotripsy; GERD.  ROS: Constitutional: no weight gain/loss, no fatigue, no subjective hyperthermia/hypothermia, + nocturia Eyes: + blurry vision, no xerophthalmia ENT: no sore throat, no nodules palpated in throat, no dysphagia/odynophagia, no hoarseness Cardiovascular: no CP/SOB/palpitations/leg swelling Respiratory: no cough/SOB Gastrointestinal: no N/V/D/C Musculoskeletal: no muscle aches/no joint aches Skin: no rashes Neurological: no tremors/numbness/tingling/dizziness  I reviewed pt's medications, allergies, PMH, social hx, family hx and no changes required, except as mentioned above.  PE: BP  132/88 mmHg  Temp(Src) 98.6 F (37 C) (Oral)  Resp 12  Wt 135 lb (61.236 kg) Body mass index is 25.52 kg/(m^2).  Wt Readings from Last 3 Encounters:  12/08/13 135 lb (61.236 kg)  11/23/13 135 lb 9.6 oz (61.508 kg)  10/06/13 135 lb (61.236 kg)   Constitutional: normal weight, in NAD, looking younger than stated age Eyes: PERRLA, EOMI, no exophthalmos ENT: moist mucous membranes, no thyromegaly, no cervical lymphadenopathy Cardiovascular: RRR, No MRG Respiratory: CTA B Gastrointestinal: abdomen  soft, TTP in LLQ, ND, BS+ Musculoskeletal: no deformities, strength intact in all 4 Skin: moist, warm, no rashes  ASSESSMENT: 1. DM2, insulin-dependent, uncontrolled, without complications  PLAN:  1. Patient with improved diabetes control after starting to follow the recommendations to take her mealtime insulin 3x a day. She still has few spikes in the 200s, but overall, her sugars are mostly at goal. Her HbA1c has impressively decreased. - I advised her to stay on the current regimen for now: Patient Instructions  Options for Levemir insulin: - Lantus pen - Lantus vial - NPH pen - NPH vial  Please continue Levemir 40 units at night Please change Humalog as follows: - 9 units before a small meal - 12 units before a regular meal  - 15 units before a large meal or if you have desert  Please come back for a follow-up appointment in 3 months.   - she will let me know after she talks with her insurance which of the above Rx's I need to send - had the flu shot this season - I will see the patient back in 3 mo with her sugar log

## 2013-12-27 ENCOUNTER — Ambulatory Visit: Payer: Medicare Other | Admitting: Family

## 2014-01-24 ENCOUNTER — Ambulatory Visit: Payer: Medicare Other | Admitting: Family

## 2014-01-24 ENCOUNTER — Telehealth: Payer: Self-pay | Admitting: Internal Medicine

## 2014-01-24 MED ORDER — INSULIN GLARGINE 300 UNIT/ML ~~LOC~~ SOPN: 40.0000 [IU] | PEN_INJECTOR | Freq: Every day | SUBCUTANEOUS | Status: DC

## 2014-01-24 NOTE — Telephone Encounter (Signed)
Toujeo only comes in pens, no vials.

## 2014-01-24 NOTE — Telephone Encounter (Signed)
Can we call in the rx for toujeo to [facility] on [address] and can we do the vials and needles not the pens. 90 day supply

## 2014-01-29 ENCOUNTER — Telehealth: Payer: Self-pay | Admitting: Internal Medicine

## 2014-01-29 MED ORDER — INSULIN GLARGINE 300 UNIT/ML ~~LOC~~ SOPN: 40.0000 [IU] | PEN_INJECTOR | Freq: Every day | SUBCUTANEOUS | Status: DC

## 2014-01-29 NOTE — Telephone Encounter (Signed)
Yes, absolutely.

## 2014-01-29 NOTE — Telephone Encounter (Signed)
Done

## 2014-01-29 NOTE — Telephone Encounter (Signed)
Please read note below and advise.  

## 2014-01-29 NOTE — Telephone Encounter (Addendum)
Patient stated that her insurance will no longer cover the Levemir, she asks if she could prescribe the Toujeo instead. Please advise

## 2014-01-30 ENCOUNTER — Ambulatory Visit: Payer: Self-pay | Admitting: Family

## 2014-02-01 ENCOUNTER — Other Ambulatory Visit: Payer: Self-pay | Admitting: Internal Medicine

## 2014-02-01 ENCOUNTER — Telehealth: Payer: Self-pay | Admitting: Internal Medicine

## 2014-02-01 NOTE — Telephone Encounter (Signed)
Patient would like to know of their are any drastic side affect with med Toujeo. Please advise

## 2014-02-01 NOTE — Telephone Encounter (Signed)
No, this is Lantus insulin, but more concentrated. Possible SEs are as with any insulin: hypoglycemia, weight gain, injection site issues

## 2014-02-01 NOTE — Telephone Encounter (Signed)
Please read note below and advise.  

## 2014-02-02 NOTE — Telephone Encounter (Signed)
Called pt and advised her. She understood and was pleased.

## 2014-02-05 ENCOUNTER — Encounter: Payer: Self-pay | Admitting: Family

## 2014-02-05 ENCOUNTER — Ambulatory Visit (INDEPENDENT_AMBULATORY_CARE_PROVIDER_SITE_OTHER): Payer: Medicare Other | Admitting: Family

## 2014-02-05 ENCOUNTER — Ambulatory Visit (INDEPENDENT_AMBULATORY_CARE_PROVIDER_SITE_OTHER)
Admission: RE | Admit: 2014-02-05 | Discharge: 2014-02-05 | Disposition: A | Discharge status: Home / Self Care | Payer: Medicare Other | Source: Ambulatory Visit | Attending: Family | Admitting: Family

## 2014-02-05 VITALS — BP 180/100 | Temp 98.3°F | Ht 61.0 in | Wt 140.0 lb

## 2014-02-05 DIAGNOSIS — R059 Cough, unspecified: Secondary | ICD-10-CM

## 2014-02-05 DIAGNOSIS — R05 Cough: Secondary | ICD-10-CM

## 2014-02-05 DIAGNOSIS — I10 Essential (primary) hypertension: Secondary | ICD-10-CM

## 2014-02-05 MED ORDER — LISINOPRIL 20 MG PO TABS: 20.0000 mg | ORAL_TABLET | Freq: Every day | ORAL | Status: DC

## 2014-02-05 NOTE — Progress Notes (Signed)
Subjective:    Patient ID: Katrina Garrison, female    DOB: [birth year], 74 y.o.   MRN: [id number]  HPI  74 year old African-American female, former smoker, with a history of hypertension is in today with complaints of a cough that is productive with clear to yellow phlegm 2-3 weeks. Originally had a fever associated that has subsided. Has been taking over-the-counter cough medication without much relief. Has had wheezing. No chest pain or palpitations. Has prescribed lisinopril but has not been taking the medication. Blood pressure elevated today.   Review of Systems  Constitutional: Negative.   HENT: Negative.   Respiratory: Positive for cough and wheezing.   Cardiovascular: Negative.  Negative for chest pain.  Gastrointestinal: Negative.   Endocrine: Negative.   Genitourinary: Negative.   Musculoskeletal: Negative.   Skin: Negative.   Allergic/Immunologic: Negative.   Neurological: Negative.   Psychiatric/Behavioral: Negative.    Past Medical History  Diagnosis Date  . Diabetes mellitus type II 2005  . GERD (gastroesophageal reflux disease)   . Hypertension   . History of lithotripsy     x3 for kidney stones  . Diverticulosis   . Anemia   . Diverticulitis     History   Social History  . Marital Status: Divorced    Spouse Name: N/A    Number of Children: N/A  . Years of Education: N/A   Occupational History  . Not on file.   Social History Main Topics  . Smoking status: Former Smoker -- 1.5 years    Quit date: 01/28/1985  . Smokeless tobacco: Not on file  . Alcohol Use: No  . Drug Use: No  . Sexual Activity: Not on file   Other Topics Concern  . Not on file   Social History Narrative    Past Surgical History  Procedure Laterality Date  . Abdominal hysterectomy    . Childbirth x 2    . Eye surgery      cataract    Family History  Problem Relation Age of Onset  . Throat cancer Mother   . Diabetes Mother   . Hypertension Mother   . Alcohol  abuse Father   . Hypertension Father   . Multiple sclerosis Sister   . Multiple sclerosis Sister     Allergies  Allergen Reactions  . Metformin And Related Diarrhea  . Sulfamethoxazole     REACTION: "comatose"    Current Outpatient Prescriptions on File Prior to Visit  Medication Sig Dispense Refill  . Blood Glucose Monitoring Suppl (RELION CONFIRM GLUCOSE MONITOR) W/DEVICE KIT Use as advised 1 kit 1  . glucose blood (RELION GLUCOSE TEST STRIPS) test strip Check sugars 3x a day 200 each 3  . ibuprofen (ADVIL,MOTRIN) 200 MG tablet Take 200 mg by mouth every 6 (six) hours as needed.      . Insulin Glargine (TOUJEO SOLOSTAR) 300 UNIT/ML SOPN Inject 40 Units into the skin at bedtime. 12 pen 1  . insulin lispro (HUMALOG KWIKPEN) 100 UNIT/ML KiwkPen Inject 12 units a day as advised 2 pen 0  . ketorolac (ACULAR) 0.5 % ophthalmic solution     . RELION LANCETS MICRO-THIN 33G MISC Check sugars 3x a day 200 each 3  . RELION MINI PEN NEEDLES 31G X 6 MM MISC USE TO CHECK BLOOD SUGAR THREE TIMES A DAY 200 each 0  . traMADol (ULTRAM) 50 MG tablet Take 1 tablet (50 mg total) by mouth every 8 (eight) hours as needed. 30 tablet 0  . hydrocortisone (ANUSOL-HC)  25 MG suppository Place 1 suppository (25 mg total) rectally 2 (two) times daily as needed for hemorrhoids (for bright red rectal bleeding.). (Patient not taking: Reported on 02/05/2014) 12 suppository 0  . ofloxacin (OCUFLOX) 0.3 % ophthalmic solution      No current facility-administered medications on file prior to visit.    BP 180/100 mmHg  Temp(Src) 98.3 F (36.8 C) (Oral)  Ht '5\' 1"'  (1.549 m)  Wt 140 lb (63.504 kg)  BMI 26.47 kg/m2chart    Objective:   Physical Exam  Constitutional: She appears well-developed and well-nourished.  HENT:  Right Ear: External ear normal.  Left Ear: External ear normal.  Nose: Nose normal.  Mouth/Throat: Oropharynx is clear and moist.  Neck: Normal range of motion. Neck supple.  Cardiovascular:  Normal rate, regular rhythm and normal heart sounds.   Pulmonary/Chest: Effort normal and breath sounds normal. She has no wheezes.  Coarse breath sounds  Abdominal: Soft. Bowel sounds are normal.  Musculoskeletal: Normal range of motion.  Neurological: She is alert.  Skin: Skin is warm and dry.  Psychiatric: She has a normal mood and affect.          Assessment & Plan:  Eldean was seen today for cough.  Diagnoses and associated orders for this visit:  Cough - DG Chest 2 View; Future  Essential hypertension  Other Orders - lisinopril (PRINIVIL,ZESTRIL) 20 MG tablet; Take 1 tablet (20 mg total) by mouth daily.    Obtain chest x-ray to rule out pneumonia. Follow-up pending results. Continue current medications. Take blood pressure medication when he get home.

## 2014-02-05 NOTE — Progress Notes (Signed)
Pre visit review using our clinic review tool, if applicable. No additional management support is needed unless otherwise documented below in the visit note. 

## 2014-02-05 NOTE — Patient Instructions (Signed)

## 2014-02-06 ENCOUNTER — Telehealth: Payer: Self-pay | Admitting: Family

## 2014-02-06 NOTE — Telephone Encounter (Signed)
Patient wanted to let you know that she will be here in the morning to pick-up the medication you discussed with her.

## 2014-02-07 NOTE — Telephone Encounter (Signed)
Asmanex given to pt

## 2014-02-23 ENCOUNTER — Ambulatory Visit: Payer: Medicare Other | Admitting: Family

## 2014-03-06 ENCOUNTER — Telehealth: Payer: Self-pay | Admitting: Family

## 2014-03-06 NOTE — Telephone Encounter (Signed)
Pt has been scheduled. Bland Spanamesha she asked if Oran Reinadonda could give her a physical in September 2016

## 2014-03-06 NOTE — Telephone Encounter (Signed)
She will have to call back closer to the time of her physical. Padonda's schedule is not open that far out

## 2014-03-06 NOTE — Telephone Encounter (Signed)
Pt said she would like you to Katrina Garrison to give her a call. She said she is the lady in the fur coat    Phone number [phone]

## 2014-03-06 NOTE — Telephone Encounter (Signed)
Please call pt to schedule New Patient appointment with provider accepting new medicare pts.  [facility]

## 2014-03-08 NOTE — Telephone Encounter (Signed)
S/w pt she said she will back in August

## 2014-04-08 ENCOUNTER — Other Ambulatory Visit: Payer: Self-pay | Admitting: Internal Medicine

## 2014-04-10 ENCOUNTER — Encounter: Payer: Self-pay | Admitting: *Deleted

## 2014-04-10 ENCOUNTER — Other Ambulatory Visit: Payer: Self-pay | Admitting: *Deleted

## 2014-04-10 MED ORDER — GLUCOSE BLOOD VI STRP
ORAL_STRIP | Status: DC
Start: 2014-04-10 — End: 2016-10-07

## 2014-04-13 ENCOUNTER — Ambulatory Visit (INDEPENDENT_AMBULATORY_CARE_PROVIDER_SITE_OTHER): Payer: Medicare Other | Admitting: Internal Medicine

## 2014-04-13 ENCOUNTER — Encounter: Payer: Self-pay | Admitting: Internal Medicine

## 2014-04-13 VITALS — BP 142/80 | HR 81 | Temp 98.7°F | Resp 12 | Wt 140.0 lb

## 2014-04-13 DIAGNOSIS — E119 Type 2 diabetes mellitus without complications: Secondary | ICD-10-CM

## 2014-04-13 LAB — HEMOGLOBIN A1C: Hgb A1c MFr Bld: 9.8 % — ABNORMAL HIGH (ref 4.6–6.5)

## 2014-04-13 NOTE — Progress Notes (Signed)
Patient ID: Katrina Garrison, female   DOB: 11/03/1940, 74 y.o.   MRN: [id number]  HPI: Katrina Garrison is a 74 y.o.-year-old female, returning for f/u for DM2, dx 2009, insulin-dependent since 2012-13, uncontrolled, without chronic complications, + hospitalization for diabetic coma x 2. Last visit 4 mo ago.   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  Patient is on the following regimen: - Toujeo 40 units at night - skipped doses as expensive - needs coupon - Humalog before the 3 meals: - skipped doses - did not take it in last week - 9 units before a small meal - 12 units before a regular meal  - 15 units before a large meal or if you have desert Stopped Metformin in 11/2013 for bloody stools. Stopped Januvia 100 mg daily >> b/c CP. Pt was in the past on a regimen of Humalog 75/25 pen - 50 units daily once a day - but was forgetting the insulin 1/7 days. She was taking the insulin at bedtime >> lows at night and highs, mostly in the 200s but also some in the 300s in am. She is doing better on Levemir.  She was also on Glipizide 10 bid in the past. She had severe diarrhea from metformin before.  Pt checks her sugars 2-4x a day and they are much higher >> has been off Humalog x at least a week (???): - am: 119-130 >> 99-136, 217, 245 >> 69x1, 121-146, few 200s >> 177-257 - 2 hours after breakfast: 356 >> 161 and 186 >> 187 >> n/c >> 123 >> 232 - before lunch: 130s >> 120-130 >> 113-142. 227, 245 >> 72-143 >> 205-224 - 2h after lunch: 161 >> n/c >> 121-152, 227x1 >> 151, 215-262, 328 - before dinner: 146-170 (highest 288) >> 100s >> n/c >> 102-156 >> 98-155 >> 166-303 - after dinner: 126 >> n/c >> 134, 163, 245x1 >> 205-384 - Bedtime: 124-156 >> n/c >> 112-141 >> 140-152, 217x1 >> 192-343 1 low at 69 >> 151; she has hypoglycemia  awareness at 60. Highest sugar 302x1 >> 384  - Pt does not have chronic kidney disease. Last BUN/Cr: Lab Results  Component Value Date   BUN 12 10/06/2013   CREATININE 0.6 10/06/2013  She is on Lisinopril. - she does not have HL. Last Lipid panel: Lab Results  Component Value Date   CHOL 124 10/06/2013   HDL 48.90 10/06/2013   LDLCALC 65 10/06/2013   TRIG 53.0 10/06/2013   CHOLHDL 3 10/06/2013  She is not on a statin. - Pt's last eye exam was in 04/11/2014, with Dr. Earlene Plater. No DR, no macular edema. Has cataracts. Had cataract Sx OU in 2015 - Denies numbness and tingling in her legs - Dr. Angelia Mould, podiatry. She saw her recently.   She also has a history of asthma, and is on frequent prednisone tapers, last around 02/2012; also HTN;  h/o nephrolithiasis, s/p lithotripsy; GERD.  ROS: Constitutional: no weight gain/loss, + fatigue, + hot flushes, + nocturia and excessive urination, + poor sleep Eyes: + blurry vision, no xerophthalmia ENT: no sore throat, no nodules palpated in throat, no dysphagia/odynophagia, no hoarseness Cardiovascular: no CP/SOB/palpitations/leg swelling Respiratory: + cough/no SOB/+ wheezing Gastrointestinal: no N/V/D/C Musculoskeletal: no muscle aches/no joint aches Skin: no rashes Neurological: no tremors/numbness/tingling/dizziness  I reviewed pt's medications, allergies, PMH, social hx, family hx,  and changes were documented in the history of present illness. Otherwise, unchanged from my initial visit note.  PE: BP 142/80 mmHg  Pulse 81  Temp(Src) 98.7 F (37.1 C) (Oral)  Resp 12  Wt 140 lb (63.504 kg)  SpO2 97% Body mass index is 26.47 kg/(m^2).  Wt Readings from Last 3 Encounters:  04/13/14 140 lb (63.504 kg)  02/05/14 140 lb (63.504 kg)  12/08/13 135 lb (61.236 kg)   Constitutional: normal weight, in NAD, looking younger than stated age Eyes: PERRLA, EOMI, no exophthalmos ENT: moist mucous membranes, no thyromegaly, no cervical  lymphadenopathy Cardiovascular: RRR, No MRG Respiratory: CTA B Gastrointestinal: abdomen soft, TTP in LLQ, ND, BS+ Musculoskeletal: no deformities, strength intact in all 4 Skin: moist, warm, no rashes  ASSESSMENT: 1. DM2, insulin-dependent, uncontrolled, without complications  PLAN:  1. Patient with initially improved diabetes control after starting to follow the recommendations to take her mealtime insulin 3x a day. In the last 4 months, though, her control worsened b/c relaxed diet and intermittent noncompliance with meds. - I advised her to stay on the current regimen for now, but take all insulin doses and also start working on her diet. She will check with her insurance if they cover nutrition appts: Patient Instructions  Please stop at the lab.  Please come back for a follow-up appointment in 3 months with your log.  Please continue Toujeo 40 units at night Please continueHumalog as follows: - 9 units before a small meal - 12 units before a regular meal  - 15 units before a large meal or if you have desert  Please try to improve your diet as we discussed. - given coupon for Toujeo - will check HbA1c today - had the flu shot this season - I will see the patient back in 1 mo with her sugar log   Office Visit on 04/13/2014  Component Date Value Ref Range Status  . Hgb A1c MFr Bld 04/13/2014 9.8* 4.6 - 6.5 % Final   Glycemic Control Guidelines for People with Diabetes:Non Diabetic:  <6%Goal of Therapy: <7%Additional Action Suggested:  >8%    As expected, HbA1c is higher.

## 2014-04-13 NOTE — Patient Instructions (Addendum)
Please stop at the lab.  Please come back for a follow-up appointment in 3 months with your log.  Please continue Toujeo 40 units at night Please continueHumalog as follows: - 9 units before a small meal - 12 units before a regular meal  - 15 units before a large meal or if you have desert  Please try to improve your diet as we discussed.

## 2014-05-23 ENCOUNTER — Ambulatory Visit: Payer: Medicare Other | Admitting: Internal Medicine

## 2014-05-24 ENCOUNTER — Telehealth: Payer: Self-pay | Admitting: Internal Medicine

## 2014-05-24 DIAGNOSIS — E119 Type 2 diabetes mellitus without complications: Secondary | ICD-10-CM

## 2014-05-24 MED ORDER — INSULIN LISPRO 100 UNIT/ML (KWIKPEN): PEN_INJECTOR | SUBCUTANEOUS | Status: DC

## 2014-05-24 MED ORDER — INSULIN PEN NEEDLE 31G X 6 MM MISC: Status: DC

## 2014-05-24 NOTE — Telephone Encounter (Signed)
Rx refill sent to pt's pharmacy 

## 2014-05-24 NOTE — Telephone Encounter (Signed)
Team health note dated 05/23/14 10:17 pm  Caller states she would like to leave a message for Dr Ernest Haberhristina Gherghe. Callers name is Katrina Garrison calling in regards to calling in an Rx Hemalog (in the vile)for her in the morning. She is on her last pin and is going out of town tomorrow. Caller advised to contact office first thing in am for assist with med. Advised nurse will send note, but she should not depend just on note for her needs.

## 2014-06-18 ENCOUNTER — Ambulatory Visit: Payer: Medicare Other | Admitting: Internal Medicine

## 2014-06-19 ENCOUNTER — Encounter: Payer: Self-pay | Admitting: Gastroenterology

## 2014-08-15 ENCOUNTER — Encounter: Payer: Self-pay | Admitting: Adult Health

## 2014-08-15 ENCOUNTER — Telehealth: Payer: Self-pay

## 2014-08-15 ENCOUNTER — Ambulatory Visit (INDEPENDENT_AMBULATORY_CARE_PROVIDER_SITE_OTHER): Payer: Medicare Other | Admitting: Adult Health

## 2014-08-15 VITALS — BP 182/90 | Temp 99.0°F | Ht 61.0 in | Wt 143.0 lb

## 2014-08-15 DIAGNOSIS — E119 Type 2 diabetes mellitus without complications: Secondary | ICD-10-CM

## 2014-08-15 DIAGNOSIS — M25552 Pain in left hip: Secondary | ICD-10-CM | POA: Diagnosis not present

## 2014-08-15 DIAGNOSIS — I10 Essential (primary) hypertension: Secondary | ICD-10-CM

## 2014-08-15 MED ORDER — LOSARTAN POTASSIUM 50 MG PO TABS: 50.0000 mg | ORAL_TABLET | Freq: Every day | ORAL | Status: DC

## 2014-08-15 MED ORDER — INSULIN GLARGINE 100 UNIT/ML SOLOSTAR PEN: 40.0000 [IU] | PEN_INJECTOR | Freq: Every day | SUBCUTANEOUS | Status: DC

## 2014-08-15 MED ORDER — TIZANIDINE HCL 4 MG PO TABS: 4.0000 mg | ORAL_TABLET | Freq: Four times a day (QID) | ORAL | Status: DC | PRN

## 2014-08-15 NOTE — Telephone Encounter (Signed)
Left switch back to Lantus 40 units at bedtime.

## 2014-08-15 NOTE — Telephone Encounter (Signed)
We are seeing pt in our clinic today.  Pt reports a fasting BS this morning of 302.  Pt has stopped her Toujeo due to it causing her to break out.  Please contact pt directly with any instructions. Pls advise.

## 2014-08-15 NOTE — Telephone Encounter (Signed)
Please read message below and advise.  

## 2014-08-15 NOTE — Progress Notes (Signed)
Pre visit review using our clinic review tool, if applicable. No additional management support is needed unless otherwise documented below in the visit note. 

## 2014-08-15 NOTE — Progress Notes (Signed)
Subjective:    Patient ID: Katrina Garrison, female    DOB: [birth year], 74 y.o.   MRN: [id number]  HPI  75 year old presents to the office for multiple acute complaints.    Diabetes Her blood sugar today was 302 at home. She is followed by Endocrine for her Diabetes management. She stopped Toujeo about a week ago due to the fact that " it was breaking me out". She did not notify endocrine of this stop.   HTN - Today her blood pressure is 182/90. She stopped her lisinopril a month ago due to cough. She did not notify anyone of this stop either. She has not had any headaches or blurred vision, but " has seen black dots on occasion."   Left hip pain  - Pain from hip to knee. Described as aching. Started " years" ago, over the last month it has become worse. When she sits is when she notices the pain, when she is walking she has no issues. Has been using Ibuprofen with relief. Has been exercising more and feels as though her pain could be from this.     Review of Systems  Constitutional: Negative.   Respiratory: Negative.   Cardiovascular: Positive for leg swelling.  Gastrointestinal: Negative.   Endocrine: Negative.   Genitourinary: Negative.   Musculoskeletal: Positive for myalgias, back pain and arthralgias.  Neurological: Negative.   Hematological: Negative.   All other systems reviewed and are negative.  Past Medical History  Diagnosis Date  . Diabetes mellitus type II 2005  . GERD (gastroesophageal reflux disease)   . Hypertension   . History of lithotripsy     x3 for kidney stones  . Diverticulosis   . Anemia   . Diverticulitis     History   Social History  . Marital Status: Divorced    Spouse Name: N/A  . Number of Children: N/A  . Years of Education: N/A   Occupational History  . Retired    Social History Main Topics  . Smoking status: Former Smoker -- 1.5 years    Quit date: 01/28/1985  . Smokeless tobacco: Not on file  . Alcohol Use: No  . Drug  Use: No  . Sexual Activity: Not on file   Other Topics Concern  . Not on file   Social History Narrative    Past Surgical History  Procedure Laterality Date  . Abdominal hysterectomy    . Childbirth x 2    . Eye surgery      cataract    Family History  Problem Relation Age of Onset  . Throat cancer Mother   . Diabetes Mother   . Hypertension Mother   . Alcohol abuse Father   . Hypertension Father   . Multiple sclerosis Sister   . Multiple sclerosis Sister     Allergies  Allergen Reactions  . Metformin And Related Diarrhea  . Sulfamethoxazole     REACTION: "comatose"    Current Outpatient Prescriptions on File Prior to Visit  Medication Sig Dispense Refill  . Blood Glucose Monitoring Suppl (RELION CONFIRM GLUCOSE MONITOR) W/DEVICE KIT Use as advised 1 kit 1  . glucose blood (RELION GLUCOSE TEST STRIPS) test strip Check sugars 3x a day. Dx code: E11.9 200 each 3  . ibuprofen (ADVIL,MOTRIN) 200 MG tablet Take 200 mg by mouth every 6 (six) hours as needed.      . insulin lispro (HUMALOG KWIKPEN) 100 UNIT/ML KiwkPen Inject 12 units a day as advised 5 pen  1  . Insulin Pen Needle (RELION MINI PEN NEEDLES) 31G X 6 MM MISC USE AS DIRECTED THREE TIMES DAILY 200 each 0  . RELION LANCETS MICRO-THIN 33G MISC Check sugars 3x a day 200 each 3  . hydrocortisone (ANUSOL-HC) 25 MG suppository Place 1 suppository (25 mg total) rectally 2 (two) times daily as needed for hemorrhoids (for bright red rectal bleeding.). (Patient not taking: Reported on 08/15/2014) 12 suppository 0  . Insulin Glargine (TOUJEO SOLOSTAR) 300 UNIT/ML SOPN Inject 40 Units into the skin at bedtime. (Patient not taking: Reported on 08/15/2014) 12 pen 1  . lisinopril (PRINIVIL,ZESTRIL) 20 MG tablet Take 1 tablet (20 mg total) by mouth daily. (Patient not taking: Reported on 08/15/2014) 90 tablet 1   No current facility-administered medications on file prior to visit.    BP 182/90 mmHg  Temp(Src) 99 F (37.2 C)  (Oral)  Ht '5\' 1"'  (1.549 m)  Wt 143 lb (64.864 kg)  BMI 27.03 kg/m2       Objective:   Physical Exam  Constitutional: She is oriented to person, place, and time. She appears well-developed and well-nourished. No distress.  HENT:  Head: Normocephalic and atraumatic.  Right Ear: External ear normal.  Left Ear: External ear normal.  Nose: Nose normal.  Mouth/Throat: Oropharynx is clear and moist. No oropharyngeal exudate.  Eyes: Conjunctivae and EOM are normal. Pupils are equal, round, and reactive to light. Right eye exhibits no discharge. Left eye exhibits no discharge.  Neck: No thyromegaly present.  Cardiovascular: Normal rate, regular rhythm, normal heart sounds and intact distal pulses.  Exam reveals no gallop and no friction rub.   No murmur heard. Pulmonary/Chest: Effort normal and breath sounds normal. No respiratory distress. She has no wheezes. She has no rales. She exhibits no tenderness.  Abdominal: Soft. Bowel sounds are normal. She exhibits no distension and no mass. There is no tenderness. There is no rebound and no guarding.  Musculoskeletal: Normal range of motion. She exhibits no edema or tenderness.  No pain with palpation to left hip  No pain with leg raise or knee to chest.   No bruising, warmth or redness noted.  Lymphadenopathy:    She has no cervical adenopathy.  Neurological: She is alert and oriented to person, place, and time. She has normal reflexes.  Skin: Skin is warm and dry. No rash noted. She is not diaphoretic. No erythema. No pallor.  Psychiatric: She has a normal mood and affect. Her behavior is normal. Judgment and thought content normal.  Nursing note and vitals reviewed.     Assessment & Plan:  1. Essential hypertension - losartan (COZAAR) 50 MG tablet; Take 1 tablet (50 mg total) by mouth daily.  Dispense: 30 tablet; Refill: 0 - Start medication immediately.  - report to ER with any CP, SOB, or blurred vision - Monitor BP at home.  -  Follow up in two weeks or sooner if needed  2. Type 2 diabetes mellitus without complications - Call placed to endocrinology about blood sugars. They will follow up with patient.   3. Left hip pain - MSK vs Arthritis vs nerve pain - tiZANidine (ZANAFLEX) 4 MG tablet; Take 1 tablet (4 mg total) by mouth every 6 (six) hours as needed for muscle spasms.  Dispense: 30 tablet; Refill: 0 - Does not want x ray at this time. She would like to try muscle relaxer first.

## 2014-08-15 NOTE — Telephone Encounter (Signed)
Called pt and advised her per Dr Charlean SanfilippoGherghe's note. Pt voiced understanding.

## 2014-08-15 NOTE — Patient Instructions (Signed)
We notified Endocrinology about your blood sugar. They will contact us or you about what they want to do.   I have sent in a prescription for Cozzar. Please take this for your blood pressure and keep a log of them.   I have also sent in a prescription for a muscle relaxer. See if this helps with your leg pain.   Follow up with me in one year

## 2014-08-24 ENCOUNTER — Ambulatory Visit (INDEPENDENT_AMBULATORY_CARE_PROVIDER_SITE_OTHER): Payer: Medicare Other | Admitting: Internal Medicine

## 2014-08-24 ENCOUNTER — Encounter: Payer: Self-pay | Admitting: Internal Medicine

## 2014-08-24 ENCOUNTER — Other Ambulatory Visit (INDEPENDENT_AMBULATORY_CARE_PROVIDER_SITE_OTHER): Payer: Medicare Other | Admitting: *Deleted

## 2014-08-24 VITALS — BP 118/64 | Temp 99.3°F | Resp 12 | Wt 145.2 lb

## 2014-08-24 DIAGNOSIS — E119 Type 2 diabetes mellitus without complications: Secondary | ICD-10-CM

## 2014-08-24 LAB — POCT GLYCOSYLATED HEMOGLOBIN (HGB A1C): Hemoglobin A1C: 9

## 2014-08-24 MED ORDER — INSULIN LISPRO 100 UNIT/ML (KWIKPEN): PEN_INJECTOR | SUBCUTANEOUS | Status: DC

## 2014-08-24 MED ORDER — INSULIN GLARGINE 100 UNIT/ML SOLOSTAR PEN: 45.0000 [IU] | PEN_INJECTOR | Freq: Every day | SUBCUTANEOUS | Status: DC

## 2014-08-24 NOTE — Patient Instructions (Addendum)
Please come back for a follow-up appointment in 1.5 months with your log.  Please increase Lantus to 45 units at night.  Please continue Humalog as follows: - 12 units before a regular meal  - 15 units before a large meal or if you have desert

## 2014-08-24 NOTE — Progress Notes (Signed)
Patient ID: Katrina Garrison, female   DOB: 1940/10/18, 74 y.o.   MRN: [id number]  HPI: Katrina Garrison is a 74 y.o.-year-old female, returning for f/u for DM2, dx 2009, insulin-dependent since 2012-13, uncontrolled, without chronic complications, + hospitalization for diabetic coma x 2. Last visit 4 mo ago (did not come back in 1 mo as recommended).   Latest hemoglobin A1c: Lab Results  Component Value Date   HGBA1C 9.8* 04/13/2014   HGBA1C 7.4* 10/06/2013   HGBA1C 11.0* 07/10/2013  Anti-islet cell and anti-GAD antibodies were undetectable.  Patient is on the following regimen (but not completely compliant - when compliant, HbA1c in the 7's): - Toujeo >> Lantus (Toujeo caused itching) 40 units at night - Humalog before the 3 meals: - 9 units before a small meal - 12 units before a regular meal  - 15 units before a large meal or if you have desert Stopped Metformin in 11/2013 for bloody stools. Stopped Januvia 100 mg daily >> b/c CP. Pt was in the past on a regimen of Humalog 75/25 pen - 50 units daily once a day - but was forgetting the insulin 1/7 days. She was taking the insulin at bedtime >> lows at night and highs, mostly in the 200s but also some in the 300s in am. She is doing better on Levemir.  She was also on Glipizide 10 bid in the past. She had severe diarrhea from metformin before.  Pt checks her sugars 2-4x a day - <200 in last week after switching to Lantus: - am: 119-130 >> 99-136, 217, 245 >> 69x1, 121-146, few 200s >> 177-257 >> 103-300 - 2 hours after breakfast: 356 >> 161 and 186 >> 187 >> n/c >> 123 >> 232 >> 229-280 - before lunch: 130s >> 120-130 >> 113-142. 227, 245 >> 72-143 >> 205-224 >> 177-295 - 2h after lunch: 161 >> n/c >> 121-152, 227x1 >> 151, 215-262, 328 >> 103-230, 352 - before dinner: 146-170 (highest 288) >> 100s >> n/c >> 102-156 >> 98-155 >> 166-303 >> 113-164, 257 - after dinner: 126 >> n/c >> 134, 163, 245x1 >> 205-384 >> 164 - Bedtime:  124-156 >> n/c >> 112-141 >> 140-152, 217x1 >> 192-343 >> 136-160 1 low at 69 >> 151; she has hypoglycemia awareness at 60. Highest sugar 302x1 >> 384 >> 300s  - Pt does not have chronic kidney disease. Last BUN/Cr: Lab Results  Component Value Date   BUN 12 10/06/2013   CREATININE 0.6 10/06/2013  She was on Lisinopril >> changed to Losartan. - she does not have HL. Last Lipid panel: Lab Results  Component Value Date   CHOL 124 10/06/2013   HDL 48.90 10/06/2013   LDLCALC 65 10/06/2013   TRIG 53.0 10/06/2013   CHOLHDL 3 10/06/2013  She is not on a statin. - Pt's last eye exam was in 04/11/2014, with Dr. Earlene Plater. No DR, no macular edema. Has cataracts. Had cataract Sx OU in 2015 - Denies numbness and tingling in her legs - Dr. Angelia Mould, podiatry. She saw her recently.   She also has a history of asthma, and is on frequent prednisone tapers, last around 02/2012; also HTN;  h/o nephrolithiasis, s/p lithotripsy; GERD.  ROS: Constitutional: no weight gain/loss, no fatigue, + hot flushes, + nocturia and excessive urination, + poor sleep, + nocturia Eyes: + blurry vision, no xerophthalmia ENT: no sore throat, no nodules palpated in throat, no dysphagia/odynophagia, no hoarseness Cardiovascular: no CP/SOB/+ palpitations/no leg swelling Respiratory: + cough/no SOB Gastrointestinal: no  N/V/D/C Musculoskeletal: + muscle aches/no joint aches Skin: no rashes, + itching Neurological: no tremors/numbness/tingling/dizziness, + HA + low libido  I reviewed pt's medications, allergies, PMH, social hx, family hx, and changes were documented in the history of present illness. Otherwise, unchanged from my initial visit note.  PE: BP 118/64 mmHg  Temp(Src) 99.3 F (37.4 C) (Oral)  Resp 12  Wt 145 lb 3.2 oz (65.862 kg) Body mass index is 27.45 kg/(m^2).  Wt Readings from Last 3 Encounters:  08/24/14 145 lb 3.2 oz (65.862 kg)  08/15/14 143 lb (64.864 kg)  04/13/14 140 lb (63.504 kg)    Constitutional: normal weight, in NAD, looking younger than stated age Eyes: PERRLA, EOMI, no exophthalmos ENT: moist mucous membranes, no thyromegaly, no cervical lymphadenopathy Cardiovascular: RRR, No MRG Respiratory: CTA B Gastrointestinal: abdomen soft, TTP in LLQ, ND, BS+ Musculoskeletal: no deformities, strength intact in all 4 Skin: moist, warm, no rashes  ASSESSMENT: 1. DM2, insulin-dependent, uncontrolled, without complications  PLAN:  1. Patient with initially initially improved diabetes control after starting to follow the recommendations to take her mealtime insulin 3x a day. In the last several months >> sugars worse as she has been off the insulins several times. She got a rash + itching from Toujeo >> now back on Lantus. Sugars are better after the switch. Lantus is 7$ for her, but Humalog 100$. We discussed cheaper alternatives, but she would like to stay with Humalog. - I advised her to stay on the current regimen for now, but take all insulin doses.  Patient Instructions  Please come back for a follow-up appointment in 1.5 months with your log.  Please increase Toujeo to 45 units at night.  Please continue Humalog as follows: - 12 units before a regular meal  - 15 units before a large meal or if you have desert  - will check HbA1c today >> 9.0% (decreasing, but still high) - I will see the patient back in 1.5 mo with her sugar log

## 2014-09-18 ENCOUNTER — Ambulatory Visit (INDEPENDENT_AMBULATORY_CARE_PROVIDER_SITE_OTHER): Payer: Medicare Other | Admitting: Adult Health

## 2014-09-18 ENCOUNTER — Encounter: Payer: Self-pay | Admitting: Adult Health

## 2014-09-18 VITALS — BP 190/96 | Temp 99.2°F | Ht 61.0 in | Wt 145.0 lb

## 2014-09-18 DIAGNOSIS — M25552 Pain in left hip: Secondary | ICD-10-CM | POA: Diagnosis not present

## 2014-09-18 DIAGNOSIS — I10 Essential (primary) hypertension: Secondary | ICD-10-CM

## 2014-09-18 MED ORDER — AMLODIPINE BESYLATE 5 MG PO TABS: 5.0000 mg | ORAL_TABLET | Freq: Every day | ORAL | Status: DC

## 2014-09-18 MED ORDER — TIZANIDINE HCL 4 MG PO TABS: 4.0000 mg | ORAL_TABLET | Freq: Four times a day (QID) | ORAL | Status: DC | PRN

## 2014-09-18 NOTE — Progress Notes (Signed)
Pre visit review using our clinic review tool, if applicable. No additional management support is needed unless otherwise documented below in the visit note. 

## 2014-09-18 NOTE — Progress Notes (Signed)
Subjective:    Patient ID: Katrina Garrison, female    DOB: Dec 16, 1940, 74 y.o.   MRN: [id number]  HPI  74 year old female who presents to the office today for follow up on her blood pressure. During the last visit on 08/15/2014 her BP was182/90, due to  stopping her lisinopril a month ago due to cough. She did not notify anyone of this stop. We started her on Losartan 63m, which the patient reports that has not been working, her log shows blood pressures in the 160's and 170's.   She has tried lisinopril but had to d/c this medication due to cost.   Denies any headaches, blurred vision or dizziness.    Review of Systems  Constitutional: Negative.   Respiratory: Negative.   Cardiovascular: Negative.   Gastrointestinal: Negative.   Musculoskeletal: Negative.   Neurological: Negative.   Psychiatric/Behavioral: Negative.   All other systems reviewed and are negative.  Past Medical History  Diagnosis Date  . Diabetes mellitus type II 2005  . GERD (gastroesophageal reflux disease)   . Hypertension   . History of lithotripsy     x3 for kidney stones  . Diverticulosis   . Anemia   . Diverticulitis     Social History   Social History  . Marital Status: Divorced    Spouse Name: N/A  . Number of Children: N/A  . Years of Education: N/A   Occupational History  . Retired    Social History Main Topics  . Smoking status: Former Smoker -- 1.5 years    Quit date: 01/28/1985  . Smokeless tobacco: Not on file  . Alcohol Use: No  . Drug Use: No  . Sexual Activity: Not on file   Other Topics Concern  . Not on file   Social History Narrative    Past Surgical History  Procedure Laterality Date  . Abdominal hysterectomy    . Childbirth x 2    . Eye surgery      cataract    Family History  Problem Relation Age of Onset  . Throat cancer Mother   . Diabetes Mother   . Hypertension Mother   . Alcohol abuse Father   . Hypertension Father   . Multiple sclerosis Sister    . Multiple sclerosis Sister     Allergies  Allergen Reactions  . Metformin And Related Diarrhea  . Sulfamethoxazole     REACTION: "comatose"    Current Outpatient Prescriptions on File Prior to Visit  Medication Sig Dispense Refill  . Blood Glucose Monitoring Suppl (RELION CONFIRM GLUCOSE MONITOR) W/DEVICE KIT Use as advised 1 kit 1  . glucose blood (RELION GLUCOSE TEST STRIPS) test strip Check sugars 3x a day. Dx code: E11.9 200 each 3  . hydrocortisone (ANUSOL-HC) 25 MG suppository Place 1 suppository (25 mg total) rectally 2 (two) times daily as needed for hemorrhoids (for bright red rectal bleeding.). 12 suppository 0  . ibuprofen (ADVIL,MOTRIN) 200 MG tablet Take 200 mg by mouth every 6 (six) hours as needed.      . Insulin Glargine (LANTUS SOLOSTAR) 100 UNIT/ML Solostar Pen Inject 45 Units into the skin at bedtime. 5 pen 2  . insulin lispro (HUMALOG KWIKPEN) 100 UNIT/ML KiwkPen Inject 12-15 units a day as advised 5 pen 1  . Insulin Pen Needle (RELION MINI PEN NEEDLES) 31G X 6 MM MISC USE AS DIRECTED THREE TIMES DAILY 200 each 0  . RELION LANCETS MICRO-THIN 33G MISC Check sugars 3x a day 200  each 3   No current facility-administered medications on file prior to visit.    BP 190/96 mmHg  Temp(Src) 99.2 F (37.3 C) (Oral)  Ht '5\' 1"'  (1.549 m)  Wt 145 lb (65.772 kg)  BMI 27.41 kg/m2       Objective:   Physical Exam  Constitutional: She is oriented to person, place, and time. She appears well-developed and well-nourished. No distress.  Cardiovascular: Normal rate, regular rhythm, normal heart sounds and intact distal pulses.  Exam reveals no gallop and no friction rub.   No murmur heard. Pulmonary/Chest: Effort normal and breath sounds normal. No respiratory distress. She has no wheezes. She has no rales. She exhibits no tenderness.  Neurological: She is alert and oriented to person, place, and time.  Skin: Skin is warm and dry. No rash noted. She is not diaphoretic. No  erythema. No pallor.  Psychiatric: She has a normal mood and affect. Her behavior is normal. Judgment and thought content normal.  Nursing note and vitals reviewed.     Assessment & Plan:  1. Essential hypertension - amLODipine (NORVASC) 5 MG tablet; Take 1 tablet (5 mg total) by mouth daily.  Dispense: 30 tablet; Refill: 3 D/C Cozaar 37m - Consider adding HCTZ - Follow up with me on how BP are trending over the week.

## 2014-09-18 NOTE — Patient Instructions (Addendum)
Start Amlodipine  and continue to monitor your blood pressure. Please let me know how the blood pressure is doing in the next week.    Follow up for your "establish care visit'

## 2014-10-03 ENCOUNTER — Encounter: Payer: Self-pay | Admitting: Adult Health

## 2014-10-03 ENCOUNTER — Ambulatory Visit (INDEPENDENT_AMBULATORY_CARE_PROVIDER_SITE_OTHER): Payer: Medicare Other | Admitting: Adult Health

## 2014-10-03 VITALS — BP 160/90 | Temp 98.6°F | Ht 61.0 in | Wt 144.5 lb

## 2014-10-03 DIAGNOSIS — Z23 Encounter for immunization: Secondary | ICD-10-CM | POA: Diagnosis not present

## 2014-10-03 DIAGNOSIS — Z780 Asymptomatic menopausal state: Secondary | ICD-10-CM | POA: Diagnosis not present

## 2014-10-03 DIAGNOSIS — E119 Type 2 diabetes mellitus without complications: Secondary | ICD-10-CM

## 2014-10-03 DIAGNOSIS — Z7189 Other specified counseling: Secondary | ICD-10-CM

## 2014-10-03 DIAGNOSIS — Z7689 Persons encountering health services in other specified circumstances: Secondary | ICD-10-CM

## 2014-10-03 DIAGNOSIS — I10 Essential (primary) hypertension: Secondary | ICD-10-CM | POA: Diagnosis not present

## 2014-10-03 LAB — BASIC METABOLIC PANEL
BUN: 17 mg/dL (ref 6–23)
CO2: 27 meq/L (ref 19–32)
Calcium: 9.6 mg/dL (ref 8.4–10.5)
Chloride: 106 mEq/L (ref 96–112)
Creatinine, Ser: 0.7 mg/dL (ref 0.40–1.20)
GFR: 105.33 mL/min (ref >60.00)
GLUCOSE: 236 mg/dL — AB (ref 70–99)
Potassium: 4.9 mEq/L (ref 3.5–5.1)
Sodium: 141 mEq/L (ref 135–145)

## 2014-10-03 NOTE — Progress Notes (Signed)
Pre visit review using our clinic review tool, if applicable. No additional management support is needed unless otherwise documented below in the visit note. 

## 2014-10-03 NOTE — Progress Notes (Signed)
HPI:  Katrina Garrison is here to establish care. She is a very pleasant AA female who presents to the office today. She  has a past medical history of Diabetes mellitus type II (2005); GERD (gastroesophageal reflux disease); Hypertension; History of lithotripsy; Diverticulosis; and Anemia.  Last PCP and physical:09/2013 with NP Justin Mend Immunizations:UTD Diet: Eat Healthy Exercise:Walks every morning.  Colonoscopy: 2015 - Every 5 years  Dexa: Will schedule Mammogram:Scheduled Dentist: every six months Diabetic Eye Exam: Yearly   Has the following chronic problems that require follow up and concerns today:  Hypertension - She continues to have elevated blood pressure. During her last visit we started her on Amlodipine 5 mg, she endorses that she has a dry cough with this medication. The same thing happened with lisinopril. Her log over the week shows blood pressures between 761-607 systolic. She denies any headache, blurred vision, or dizziness. Her BP today in the office is BP: (!) 160/90 mmHg    Diabetes  - Has follow up appointment with Dr. Cruzita Lederer in two weeks. She is currently taking 40 units of Lantus. She endorses that her blood sugars have continued to be in the high 200's to 300's. She is watching what she eats.    ROS negative for unless reported above: fevers, chills,feeling poorly, unintentional weight loss, hearing or vision loss, chest pain, palpitations, leg claudication, struggling to breath,Not feeling congested in the chest, no orthopenia, no cough,no wheezing, normal appetite, no soft tissue swelling, no hemoptysis, melena, hematochezia, hematuria, falls, loc, si, or thoughts of self harm.      Past Medical History  Diagnosis Date  . Diabetes mellitus type II 2005  . GERD (gastroesophageal reflux disease)   . Hypertension   . History of lithotripsy     x3 for kidney stones  . Diverticulosis   . Anemia   . Diverticulitis     Past Surgical History   Procedure Laterality Date  . Abdominal hysterectomy    . Childbirth x 2    . Eye surgery      cataract    Family History  Problem Relation Age of Onset  . Throat cancer Mother   . Diabetes Mother   . Hypertension Mother   . Alcohol abuse Father   . Hypertension Father   . Multiple sclerosis Sister   . Multiple sclerosis Sister     Social History   Social History  . Marital Status: Divorced    Spouse Name: N/A  . Number of Children: N/A  . Years of Education: N/A   Occupational History  . Retired    Social History Main Topics  . Smoking status: Former Smoker -- 1.5 years    Quit date: 01/28/1985  . Smokeless tobacco: None  . Alcohol Use: No  . Drug Use: No  . Sexual Activity: Not Asked   Other Topics Concern  . None   Social History Narrative     Current outpatient prescriptions:  .  amLODipine (NORVASC) 5 MG tablet, Take 1 tablet (5 mg total) by mouth daily., Disp: 30 tablet, Rfl: 3 .  Blood Glucose Monitoring Suppl (RELION CONFIRM GLUCOSE MONITOR) W/DEVICE KIT, Use as advised, Disp: 1 kit, Rfl: 1 .  glucose blood (RELION GLUCOSE TEST STRIPS) test strip, Check sugars 3x a day. Dx code: E11.9, Disp: 200 each, Rfl: 3 .  hydrocortisone (ANUSOL-HC) 25 MG suppository, Place 1 suppository (25 mg total) rectally 2 (two) times daily as needed for hemorrhoids (for bright red rectal bleeding.)., Disp: 12  suppository, Rfl: 0 .  ibuprofen (ADVIL,MOTRIN) 200 MG tablet, Take 200 mg by mouth every 6 (six) hours as needed.  , Disp: , Rfl:  .  Insulin Glargine (LANTUS SOLOSTAR) 100 UNIT/ML Solostar Pen, Inject 45 Units into the skin at bedtime., Disp: 5 pen, Rfl: 2 .  insulin lispro (HUMALOG KWIKPEN) 100 UNIT/ML KiwkPen, Inject 12-15 units a day as advised, Disp: 5 pen, Rfl: 1 .  Insulin Pen Needle (RELION MINI PEN NEEDLES) 31G X 6 MM MISC, USE AS DIRECTED THREE TIMES DAILY, Disp: 200 each, Rfl: 0 .  RELION LANCETS MICRO-THIN 33G MISC, Check sugars 3x a day, Disp: 200 each, Rfl:  3 .  tiZANidine (ZANAFLEX) 4 MG tablet, Take 1 tablet (4 mg total) by mouth every 6 (six) hours as needed for muscle spasms., Disp: 30 tablet, Rfl: 0  EXAM:  Filed Vitals:   10/03/14 0809  BP: 160/90  Temp: 98.6 F (37 C)    Body mass index is 27.32 kg/(m^2).  GENERAL: vitals reviewed and listed above, alert, oriented, appears well hydrated and in no acute distress.   HEENT: atraumatic, conjunttiva clear, no obvious abnormalities on inspection of external nose and ears. TM's visualized, no cerumen impaction.   NECK: Neck is soft and supple without masses, no adenopathy or thyromegaly, trachea midline, no JVD. Normal range of motion.   LUNGS: clear to auscultation bilaterally, no wheezes, rales or rhonchi, good air movement  CV: Regular rate and rhythm, normal S1/S2, no audible murmurs, gallops, or rubs. No carotid bruit and no peripheral edema.   MS: moves all extremities without noticeable abnormality. No edema noted.   Abd: soft/nontender/nondistended/normal bowel sounds   Skin: warm and dry, no rash   Extremities: No clubbing, cyanosis, or edema. Capillary refill is WNL. Pulses intact bilaterally in upper and lower extremities.   Neuro: CN II-XII intact, sensation and reflexes normal throughout, 5/5 muscle strength in bilateral upper and lower extremities. Normal finger to nose. Normal rapid alternating movements.   PSYCH: pleasant and cooperative, no obvious depression or anxiety  ASSESSMENT AND PLAN:  1. Encounter to establish care - Follow up in one week for hypertension follow up - Follow up sooner if needed - Monitor BP in morning and at night.  -   2. Encounter for immunization - Flu shot given   3. Essential hypertension - Basic metabolic panel - Hyzaar 33-61.2AE, daily, 30 pills with 3 refills sent to pharmacy.  - Monitor BP at home BID  4. Postmenopausal - DG Bone Density; Future - MM DIGITAL SCREENING BILATERAL; Future  5. Type 2 diabetes  mellitus without complications - Follow up with Dr. Cruzita Lederer    Discussed the following assessment and plan:  Encounter for immunization -We reviewed the PMH, PSH, FH, SH, Meds and Allergies. -We provided refills for any medications we will prescribe as needed. -We addressed current concerns per orders and patient instructions. -We have asked for records for pertinent exams, studies, vaccines and notes from previous providers. -We have advised patient to follow up per instructions below.   -Patient advised to return or notify a provider immediately if symptoms worsen or persist or new concerns arise.   Dorothyann Peng, AGNP

## 2014-10-03 NOTE — Patient Instructions (Addendum)
It was great seeing you today!   I will call you after your blood work comes back.   Monitor your blood pressure twice a day for the next week and bring your paper work  Schedule an appointment for a follow up regarding your blood pressure  Schedule a complete physical as well.

## 2014-10-04 MED ORDER — LOSARTAN POTASSIUM-HCTZ 50-12.5 MG PO TABS: 1.0000 | ORAL_TABLET | Freq: Every day | ORAL | Status: DC

## 2014-10-09 ENCOUNTER — Telehealth: Payer: Self-pay | Admitting: Family

## 2014-10-09 NOTE — Telephone Encounter (Signed)
Ok. I am glad it is working. I still need her to come in for repeat labs though.

## 2014-10-09 NOTE — Telephone Encounter (Signed)
Spoke with pt and pt is aware.  Lab appt scheduled for Friday.

## 2014-10-09 NOTE — Telephone Encounter (Signed)
Pt states that her blood pressure medication is working.

## 2014-10-09 NOTE — Telephone Encounter (Signed)
Pt cancelled follow up appt for 9.14.2015.

## 2014-10-10 ENCOUNTER — Ambulatory Visit: Payer: Medicare Other | Admitting: Adult Health

## 2014-10-12 ENCOUNTER — Other Ambulatory Visit (INDEPENDENT_AMBULATORY_CARE_PROVIDER_SITE_OTHER): Payer: Medicare Other

## 2014-10-12 ENCOUNTER — Other Ambulatory Visit: Payer: Self-pay | Admitting: Adult Health

## 2014-10-12 ENCOUNTER — Telehealth: Payer: Self-pay | Admitting: Adult Health

## 2014-10-12 DIAGNOSIS — I10 Essential (primary) hypertension: Secondary | ICD-10-CM

## 2014-10-12 LAB — BASIC METABOLIC PANEL
BUN: 17 mg/dL (ref 6–23)
CALCIUM: 10 mg/dL (ref 8.4–10.5)
CO2: 29 mEq/L (ref 19–32)
Chloride: 102 mEq/L (ref 96–112)
Creatinine, Ser: 0.73 mg/dL (ref 0.40–1.20)
GFR: 100.34 mL/min (ref >60.00)
GLUCOSE: 223 mg/dL — AB (ref 70–99)
Potassium: 4.7 mEq/L (ref 3.5–5.1)
SODIUM: 138 meq/L (ref 135–145)

## 2014-10-12 NOTE — Telephone Encounter (Signed)
Informed patient of labs results. She reports that her BP today was 129/80

## 2014-10-15 ENCOUNTER — Other Ambulatory Visit: Payer: Medicare Other

## 2014-10-16 ENCOUNTER — Ambulatory Visit: Payer: Medicare Other | Admitting: Internal Medicine

## 2014-10-17 ENCOUNTER — Telehealth: Payer: Self-pay | Admitting: Adult Health

## 2014-10-17 DIAGNOSIS — Z1239 Encounter for other screening for malignant neoplasm of breast: Secondary | ICD-10-CM

## 2014-10-17 NOTE — Telephone Encounter (Signed)
Labs are in

## 2014-10-17 NOTE — Telephone Encounter (Signed)
Pt needs orders for bone density test and mammogram. Pt is schedule at [facility] on 12-05-14. Pt has bcbs [employer]

## 2014-11-15 ENCOUNTER — Ambulatory Visit: Payer: Medicare Other | Admitting: Internal Medicine

## 2014-11-20 ENCOUNTER — Other Ambulatory Visit: Payer: Self-pay | Admitting: Internal Medicine

## 2014-11-21 ENCOUNTER — Ambulatory Visit (INDEPENDENT_AMBULATORY_CARE_PROVIDER_SITE_OTHER): Payer: Medicare Other | Admitting: Adult Health

## 2014-11-21 ENCOUNTER — Encounter: Payer: Self-pay | Admitting: Adult Health

## 2014-11-21 VITALS — BP 170/90 | Temp 97.7°F | Ht 61.0 in | Wt 146.0 lb

## 2014-11-21 DIAGNOSIS — E785 Hyperlipidemia, unspecified: Secondary | ICD-10-CM

## 2014-11-21 DIAGNOSIS — Z0000 Encounter for general adult medical examination without abnormal findings: Secondary | ICD-10-CM

## 2014-11-21 DIAGNOSIS — Z794 Long term (current) use of insulin: Secondary | ICD-10-CM | POA: Diagnosis not present

## 2014-11-21 DIAGNOSIS — E119 Type 2 diabetes mellitus without complications: Secondary | ICD-10-CM

## 2014-11-21 DIAGNOSIS — I10 Essential (primary) hypertension: Secondary | ICD-10-CM

## 2014-11-21 LAB — CBC WITH DIFFERENTIAL/PLATELET
Basophils Absolute: 0 10*3/uL (ref 0.0–0.1)
Basophils Relative: 0.3 % (ref 0.0–3.0)
Eosinophils Absolute: 0.1 10*3/uL (ref 0.0–0.7)
Eosinophils Relative: 0.9 % (ref 0.0–5.0)
HCT: 41.8 % (ref 36.0–46.0)
Hemoglobin: 13.6 g/dL (ref 12.0–15.0)
Lymphocytes Relative: 24.7 % (ref 12.0–46.0)
Lymphs Abs: 1.9 10*3/uL (ref 0.7–4.0)
MCHC: 32.6 g/dL (ref 30.0–36.0)
MCV: 83.4 fl (ref 78.0–100.0)
Monocytes Absolute: 0.4 10*3/uL (ref 0.1–1.0)
Monocytes Relative: 4.9 % (ref 3.0–12.0)
Neutro Abs: 5.3 10*3/uL (ref 1.4–7.7)
Neutrophils Relative %: 69.2 % (ref 43.0–77.0)
PLATELETS: 257 10*3/uL (ref 150.0–400.0)
RBC: 5.01 Mil/uL (ref 3.87–5.11)
RDW: 15.5 % (ref 11.5–15.5)
WBC: 7.7 10*3/uL (ref 4.0–10.5)

## 2014-11-21 LAB — BASIC METABOLIC PANEL
BUN: 14 mg/dL (ref 6–23)
CO2: 28 mEq/L (ref 19–32)
Calcium: 9.8 mg/dL (ref 8.4–10.5)
Chloride: 102 mEq/L (ref 96–112)
Creatinine, Ser: 0.71 mg/dL (ref 0.40–1.20)
GFR: 103.58 mL/min (ref >60.00)
GLUCOSE: 249 mg/dL — AB (ref 70–99)
POTASSIUM: 4.1 meq/L (ref 3.5–5.1)
Sodium: 140 mEq/L (ref 135–145)

## 2014-11-21 LAB — HEMOGLOBIN A1C: HEMOGLOBIN A1C: 10.1 % — AB (ref 4.6–6.5)

## 2014-11-21 LAB — HEPATIC FUNCTION PANEL
ALBUMIN: 4.2 g/dL (ref 3.5–5.2)
ALK PHOS: 98 U/L (ref 39–117)
ALT: 17 U/L (ref 0–35)
AST: 19 U/L (ref 0–37)
Bilirubin, Direct: 0.1 mg/dL (ref 0.0–0.3)
TOTAL PROTEIN: 7.7 g/dL (ref 6.0–8.3)
Total Bilirubin: 0.7 mg/dL (ref 0.2–1.2)

## 2014-11-21 LAB — POCT URINALYSIS DIPSTICK
BILIRUBIN UA: NEGATIVE
Blood, UA: NEGATIVE
LEUKOCYTES UA: NEGATIVE
NITRITE UA: NEGATIVE
Protein, UA: NEGATIVE
Spec Grav, UA: 1.025
Urobilinogen, UA: 0.2
pH, UA: 6

## 2014-11-21 LAB — LIPID PANEL
CHOLESTEROL: 151 mg/dL (ref 0–200)
HDL: 47.5 mg/dL (ref >39.00)
LDL Cholesterol: 79 mg/dL (ref 0–99)
NONHDL: 103.92
TRIGLYCERIDES: 126 mg/dL (ref 0.0–149.0)
Total CHOL/HDL Ratio: 3
VLDL: 25.2 mg/dL (ref 0.0–40.0)

## 2014-11-21 LAB — VITAMIN D 25 HYDROXY (VIT D DEFICIENCY, FRACTURES): VITD: 20.23 ng/mL — AB (ref 30.00–100.00)

## 2014-11-21 LAB — TSH: TSH: 1.1 u[IU]/mL (ref 0.35–4.50)

## 2014-11-21 MED ORDER — LISINOPRIL 10 MG PO TABS: 10.0000 mg | ORAL_TABLET | Freq: Every day | ORAL | Status: DC

## 2014-11-21 NOTE — Patient Instructions (Addendum)
It was great seeing you again today!  You really need to start watching your diet. Cut out salts, carbs and sugars.   Follow up with Dr Renne Crigler as scheduled  I have sent in a prescription for lisinopril, take this as directed and monitor your blood pressure at home.   Follow up with me in 3 months.   Let me know if your blood pressure is not more well controlled within the next two weeks.    Health Maintenance, Female Adopting a healthy lifestyle and getting preventive care can go a long way to promote health and wellness. Talk with your health care provider about what schedule of regular examinations is right for you. This is a good chance for you to check in with your provider about disease prevention and staying healthy. In between checkups, there are plenty of things you can do on your own. Experts have done a lot of research about which lifestyle changes and preventive measures are most likely to keep you healthy. Ask your health care provider for more information. WEIGHT AND DIET  Eat a healthy diet  Be sure to include plenty of vegetables, fruits, low-fat dairy products, and lean protein.  Do not eat a lot of foods high in solid fats, added sugars, or salt.  Get regular exercise. This is one of the most important things you can do for your health.  Most adults should exercise for at least 150 minutes each week. The exercise should increase your heart rate and make you sweat (moderate-intensity exercise).  Most adults should also do strengthening exercises at least twice a week. This is in addition to the moderate-intensity exercise.  Maintain a healthy weight  Body mass index (BMI) is a measurement that can be used to identify possible weight problems. It estimates body fat based on height and weight. Your health care provider can help determine your BMI and help you achieve or maintain a healthy weight.  For females 43 years of age and older:   A BMI below 18.5 is  considered underweight.  A BMI of 18.5 to 24.9 is normal.  A BMI of 25 to 29.9 is considered overweight.  A BMI of 30 and above is considered obese.  Watch levels of cholesterol and blood lipids  You should start having your blood tested for lipids and cholesterol at 73 years of age, then have this test every 5 years.  You may need to have your cholesterol levels checked more often if:  Your lipid or cholesterol levels are high.  You are older than 73 years of age.  You are at high risk for heart disease.  CANCER SCREENING   Lung Cancer  Lung cancer screening is recommended for adults 41-84 years old who are at high risk for lung cancer because of a history of smoking.  A yearly low-dose CT scan of the lungs is recommended for people who:  Currently smoke.  Have quit within the past 15 years.  Have at least a 30-pack-year history of smoking. A pack year is smoking an average of one pack of cigarettes a day for 1 year.  Yearly screening should continue until it has been 15 years since you quit.  Yearly screening should stop if you develop a health problem that would prevent you from having lung cancer treatment.  Breast Cancer  Practice breast self-awareness. This means understanding how your breasts normally appear and feel.  It also means doing regular breast self-exams. Let your health care provider know  about any changes, no matter how small.  If you are in your 20s or 30s, you should have a clinical breast exam (CBE) by a health care provider every 1-3 years as part of a regular health exam.  If you are 73 or older, have a CBE every year. Also consider having a breast X-ray (mammogram) every year.  If you have a family history of breast cancer, talk to your health care provider about genetic screening.  If you are at high risk for breast cancer, talk to your health care provider about having an MRI and a mammogram every year.  Breast cancer gene (BRCA)  assessment is recommended for women who have family members with BRCA-related cancers. BRCA-related cancers include:  Breast.  Ovarian.  Tubal.  Peritoneal cancers.  Results of the assessment will determine the need for genetic counseling and BRCA1 and BRCA2 testing. Cervical Cancer Your health care provider may recommend that you be screened regularly for cancer of the pelvic organs (ovaries, uterus, and vagina). This screening involves a pelvic examination, including checking for microscopic changes to the surface of your cervix (Pap test). You may be encouraged to have this screening done every 3 years, beginning at age 54.  For women ages 39-65, health care providers may recommend pelvic exams and Pap testing every 3 years, or they may recommend the Pap and pelvic exam, combined with testing for human papilloma virus (HPV), every 5 years. Some types of HPV increase your risk of cervical cancer. Testing for HPV may also be done on women of any age with unclear Pap test results.  Other health care providers may not recommend any screening for nonpregnant women who are considered low risk for pelvic cancer and who do not have symptoms. Ask your health care provider if a screening pelvic exam is right for you.  If you have had past treatment for cervical cancer or a condition that could lead to cancer, you need Pap tests and screening for cancer for at least 20 years after your treatment. If Pap tests have been discontinued, your risk factors (such as having a new sexual partner) need to be reassessed to determine if screening should resume. Some women have medical problems that increase the chance of getting cervical cancer. In these cases, your health care provider may recommend more frequent screening and Pap tests. Colorectal Cancer  This type of cancer can be detected and often prevented.  Routine colorectal cancer screening usually begins at 73 years of age and continues through 73 years  of age.  Your health care provider may recommend screening at an earlier age if you have risk factors for colon cancer.  Your health care provider may also recommend using home test kits to check for hidden blood in the stool.  A small camera at the end of a tube can be used to examine your colon directly (sigmoidoscopy or colonoscopy). This is done to check for the earliest forms of colorectal cancer.  Routine screening usually begins at age 42.  Direct examination of the colon should be repeated every 5-10 years through 73 years of age. However, you may need to be screened more often if early forms of precancerous polyps or small growths are found. Skin Cancer  Check your skin from head to toe regularly.  Tell your health care provider about any new moles or changes in moles, especially if there is a change in a mole's shape or color.  Also tell your health care provider if you  have a mole that is larger than the size of a pencil eraser.  Always use sunscreen. Apply sunscreen liberally and repeatedly throughout the day.  Protect yourself by wearing long sleeves, pants, a wide-brimmed hat, and sunglasses whenever you are outside. HEART DISEASE, DIABETES, AND HIGH BLOOD PRESSURE   High blood pressure causes heart disease and increases the risk of stroke. High blood pressure is more likely to develop in:  People who have blood pressure in the high end of the normal range (130-139/85-89 mm Hg).  People who are overweight or obese.  People who are African American.  If you are 9-44 years of age, have your blood pressure checked every 3-5 years. If you are 69 years of age or older, have your blood pressure checked every year. You should have your blood pressure measured twice--once when you are at a hospital or clinic, and once when you are not at a hospital or clinic. Record the average of the two measurements. To check your blood pressure when you are not at a hospital or clinic, you  can use:  An automated blood pressure machine at a pharmacy.  A home blood pressure monitor.  If you are between 75 years and 92 years old, ask your health care provider if you should take aspirin to prevent strokes.  Have regular diabetes screenings. This involves taking a blood sample to check your fasting blood sugar level.  If you are at a normal weight and have a low risk for diabetes, have this test once every three years after 73 years of age.  If you are overweight and have a high risk for diabetes, consider being tested at a younger age or more often. PREVENTING INFECTION  Hepatitis B  If you have a higher risk for hepatitis B, you should be screened for this virus. You are considered at high risk for hepatitis B if:  You were born in a country where hepatitis B is common. Ask your health care provider which countries are considered high risk.  Your parents were born in a high-risk country, and you have not been immunized against hepatitis B (hepatitis B vaccine).  You have HIV or AIDS.  You use needles to inject street drugs.  You live with someone who has hepatitis B.  You have had sex with someone who has hepatitis B.  You get hemodialysis treatment.  You take certain medicines for conditions, including cancer, organ transplantation, and autoimmune conditions. Hepatitis C  Blood testing is recommended for:  Everyone born from [birth year] through 1964.  Anyone with known risk factors for hepatitis C. Sexually transmitted infections (STIs)  You should be screened for sexually transmitted infections (STIs) including gonorrhea and chlamydia if:  You are sexually active and are younger than 73 years of age.  You are older than 73 years of age and your health care provider tells you that you are at risk for this type of infection.  Your sexual activity has changed since you were last screened and you are at an increased risk for chlamydia or gonorrhea. Ask your health  care provider if you are at risk.  If you do not have HIV, but are at risk, it may be recommended that you take a prescription medicine daily to prevent HIV infection. This is called pre-exposure prophylaxis (PrEP). You are considered at risk if:  You are sexually active and do not regularly use condoms or know the HIV status of your partner(s).  You take drugs by injection.  You are sexually active with a partner who has HIV. Talk with your health care provider about whether you are at high risk of being infected with HIV. If you choose to begin PrEP, you should first be tested for HIV. You should then be tested every 3 months for as long as you are taking PrEP.  PREGNANCY   If you are premenopausal and you may become pregnant, ask your health care provider about preconception counseling.  If you may become pregnant, take 400 to 800 micrograms (mcg) of folic acid every day.  If you want to prevent pregnancy, talk to your health care provider about birth control (contraception). OSTEOPOROSIS AND MENOPAUSE   Osteoporosis is a disease in which the bones lose minerals and strength with aging. This can result in serious bone fractures. Your risk for osteoporosis can be identified using a bone density scan.  If you are 13 years of age or older, or if you are at risk for osteoporosis and fractures, ask your health care provider if you should be screened.  Ask your health care provider whether you should take a calcium or vitamin D supplement to lower your risk for osteoporosis.  Menopause may have certain physical symptoms and risks.  Hormone replacement therapy may reduce some of these symptoms and risks. Talk to your health care provider about whether hormone replacement therapy is right for you.  HOME CARE INSTRUCTIONS   Schedule regular health, dental, and eye exams.  Stay current with your immunizations.   Do not use any tobacco products including cigarettes, chewing tobacco, or  electronic cigarettes.  If you are pregnant, do not drink alcohol.  If you are breastfeeding, limit how much and how often you drink alcohol.  Limit alcohol intake to no more than 1 drink per day for nonpregnant women. One drink equals 12 ounces of beer, 5 ounces of wine, or 1 ounces of hard liquor.  Do not use street drugs.  Do not share needles.  Ask your health care provider for help if you need support or information about quitting drugs.  Tell your health care provider if you often feel depressed.  Tell your health care provider if you have ever been abused or do not feel safe at home.   This information is not intended to replace advice given to you by your health care provider. Make sure you discuss any questions you have with your health care provider.   Document Released: 07/28/2010 Document Revised: 02/02/2014 Document Reviewed: 12/14/2012 Elsevier Interactive Patient Education [employer].

## 2014-11-21 NOTE — Progress Notes (Signed)
Subjective:  Patient presents for yearly preventative medicine examination. Medicare questionnaire was completed  All immunizations and health maintenance protocols were reviewed with the patient and needed orders were placed.  Appropriate screening laboratory values were ordered for the patient including screening of hyperlipidemia, renal function and hepatic function.  Medication reconciliation,  past medical history, social history, problem list and allergies were reviewed in detail with the patient  Goals were established with regard to weight loss, exercise, and  diet in compliance with medications  End of life planning was discussed- she has a living will and power of attorney.   She has had her diabetic eye exam in 2016 and is scheduled for her mammogram/bone density in November.   Diabetes  - She is followed by Endocrinology for this. She does endorse that she has been using her insulin as directed but her diet is still an issue. She eats whatever she wants. Continues to eat a lot of sweets and carbohydrates. Has been eating a lot of high sodium food as of late. She is monitoring her blood sugars at home and they have been " high" in the "200's". She is seeing Dr. Cruzita Lederer in November. She does endorse an understanding of the disease process and what she needs to do in order to get her blood sugars under control.   Hypertension - She has been monitoring her BP at home and it continues to be elevated. Per her records the blood pressures are in the 150-160 range. Unfortunantly, she continues to not follow a healthy diet and is eating a lot of high sodium foods. Today in the office her BP was 170/90, she endorses being compliant with medications.   She was on Lisinopril in the past and was taken off for a cough. She continues to have a chronic cough.   Preventive Screening-Counseling & Management  Smoking Status: Former Smoker [employer] Smoking status: No smokers in home  Risk  Factors Regular exercise: Walks for 30 minutes a day 4 times a week Diet: Does not follow a diet. Fried foods, high sodium foods, carbs, sweets Fall Risk: None   Cardiac risk factors:  advanced age (older than 32 for men, 63 for women)  No Hyperlipidemia  diabetes.  Family History: Mother and father both with HTN   Depression Screen None. PHQ2 0  Activities of Daily Living Independent ADLs and IADLs   Hearing Difficulties: patient declines  Cognitive Testing No reported trouble.   Normal 3 word recall  List the Names of Other Physician/Practitioners you currently use: 1.Dr. Fritzi Mandes - Podiatry 2. Dr. Cruzita Lederer - Endocrinology 2. Dr. Danice Goltz - Optho  Immunization History  Administered Date(s) Administered  . Influenza Split 12/16/2010, 11/11/2011, 11/08/2012  . Influenza Whole 01/26/2005, 12/06/2009  . Influenza, High Dose Seasonal PF 10/03/2014  . Influenza,inj,Quad PF,36+ Mos 11/23/2013  . Pneumococcal Conjugate-13 10/06/2013  . Pneumococcal Polysaccharide-23 01/27/2003  . Td 01/27/2003, 10/06/2013   Required Immunizations needed today UTD  Screening tests- up to date Health Maintenance Due  Topic Date Due  . ZOSTAVAX  03/13/2001  . DEXA SCAN  03/13/2006  . OPHTHALMOLOGY EXAM  03/11/2012  . URINE MICROALBUMIN  10/14/2013  . FOOT EXAM  10/07/2014  . PNA vac Low Risk Adult (2 of 2 - PPSV23) 10/07/2014    ROS- No pertinent positives discovered in course of AWV  The following were reviewed and entered/updated in epic: Past Medical History  Diagnosis Date  . Diabetes mellitus type II 2005  . GERD (gastroesophageal  reflux disease)   . Hypertension   . History of lithotripsy     x3 for kidney stones  . Diverticulosis   . Anemia    Patient Active Problem List   Diagnosis Date Noted  . Hyperlipidemia 11/23/2013  . Diverticulosis 02/21/2013  . Ischemic colitis ([place]) 01/20/2013  . GI bleed 01/18/2013  . Extrinsic asthma, unspecified 03/17/2012  .  Type 2 diabetes mellitus without complications ([place]) 58/59/2924  . Essential hypertension 08/09/2006  . GERD 08/09/2006   Past Surgical History  Procedure Laterality Date  . Abdominal hysterectomy    . Childbirth x 2    . Eye surgery      cataract    Family History  Problem Relation Age of Onset  . Thyroid cancer Mother   . Diabetes Mother   . Hypertension Mother   . Alcohol abuse Father   . Hypertension Father   . Multiple sclerosis Daughter   . Multiple sclerosis Daughter   . Multiple sclerosis Sister     Medications- reviewed and updated Current Outpatient Prescriptions  Medication Sig Dispense Refill  . amLODipine (NORVASC) 5 MG tablet Take 1 tablet (5 mg total) by mouth daily. 30 tablet 3  . Blood Glucose Monitoring Suppl (RELION CONFIRM GLUCOSE MONITOR) W/DEVICE KIT Use as advised 1 kit 1  . glucose blood (RELION GLUCOSE TEST STRIPS) test strip Check sugars 3x a day. Dx code: E11.9 200 each 3  . hydrocortisone (ANUSOL-HC) 25 MG suppository Place 1 suppository (25 mg total) rectally 2 (two) times daily as needed for hemorrhoids (for bright red rectal bleeding.). 12 suppository 0  . ibuprofen (ADVIL,MOTRIN) 200 MG tablet Take 200 mg by mouth every 6 (six) hours as needed.      . Insulin Glargine (LANTUS SOLOSTAR) 100 UNIT/ML Solostar Pen Inject 45 Units into the skin at bedtime. 5 pen 2  . insulin lispro (HUMALOG KWIKPEN) 100 UNIT/ML KiwkPen Inject 12-15 units a day as advised 5 pen 1  . insulin lispro (HUMALOG KWIKPEN) 100 UNIT/ML KiwkPen Inject 0.12-0.15 mLs (12-15 Units total) into the skin 3 (three) times daily. 15 mL 2  . Insulin Pen Needle (RELION MINI PEN NEEDLES) 31G X 6 MM MISC USE AS DIRECTED THREE TIMES DAILY 200 each 0  . losartan-hydrochlorothiazide (HYZAAR) 50-12.5 MG per tablet Take 1 tablet by mouth daily. 30 tablet 3  . RELION LANCETS MICRO-THIN 33G MISC Check sugars 3x a day 200 each 3  . tiZANidine (ZANAFLEX) 4 MG tablet Take 1 tablet (4 mg total) by mouth  every 6 (six) hours as needed for muscle spasms. 30 tablet 0   No current facility-administered medications for this visit.    Allergies-reviewed and updated Allergies  Allergen Reactions  . Metformin And Related Diarrhea  . Sulfamethoxazole     REACTION: "comatose"    Social History   Social History  . Marital Status: Divorced    Spouse Name: N/A  . Number of Children: N/A  . Years of Education: N/A   Occupational History  . Retired    Social History Main Topics  . Smoking status: Former Smoker -- 1.5 years    Quit date: 01/28/1985  . Smokeless tobacco: Not on file  . Alcohol Use: No  . Drug Use: No  . Sexual Activity: Not on file   Other Topics Concern  . Not on file   Social History Narrative    Objective: There were no vitals taken for this visit. GENERAL: vitals reviewed and listed above, alert, oriented, appears  well hydrated and in no acute distress.   HEENT: atraumatic, conjunttiva clear, no obvious abnormalities on inspection of external nose and ears. TM's visualized, no cerumen impaction.   NECK: Neck is soft and supple without masses, no adenopathy or thyromegaly, trachea midline, no JVD. Normal range of motion.   LUNGS: clear to auscultation bilaterally, no wheezes, rales or rhonchi, good air movement. Has a dry cough  CV: Regular rate and rhythm, normal S1/S2, no audible murmurs, gallops, or rubs. No carotid bruit and no peripheral edema.   MS: moves all extremities without noticeable abnormality. No edema noted.   Abd: soft/nontender/nondistended/normal bowel sounds   Skin: warm and dry, no rash   Breast Exam: WNL. No lumps, masses, dimpling or discharge.   Extremities: No clubbing, cyanosis, or edema. Capillary refill is WNL. Pulses intact bilaterally in upper and lower extremities.   Neuro: CN II-XII intact, sensation and reflexes normal throughout, 5/5 muscle strength in bilateral upper and lower extremities. Normal finger to nose.  Normal rapid alternating movements.   PSYCH: pleasant and cooperative, no obvious depression or anxiety  Assessment/Plan:  1. Essential hypertension - Basic metabolic panel - CBC with Differential/Platelet - EKG 12-Lead - Hemoglobin A1c - Hepatic function panel - Lipid panel - POCT urinalysis dipstick - TSH - lisinopril (PRINIVIL,ZESTRIL) 10 MG tablet; Take 1 tablet (10 mg total) by mouth daily.  Dispense: 90 tablet; Refill: 1 - Do not think her cough is from ACEI - Will restart lisinopril and follow up with patient in one month  - Call in one week with her BP results - She needs to start eating right and cutting out sodium from her diet.   2. Type 2 diabetes mellitus without complication, with long-term current use of insulin (HCC) - Basic metabolic panel - CBC with Differential/Platelet - EKG 12-Lead - Hemoglobin A1c - Hepatic function panel - Lipid panel - POCT urinalysis dipstick - Follow up with Endorcrinology - She knows that she needs to incorporate a diabetic diet into her life.  - Order for diabetic educator placed  3. Hyperlipidemia - Basic metabolic panel - CBC with Differential/Platelet - EKG 12-Lead - Hemoglobin A1c - Hepatic function panel - Lipid panel - POCT urinalysis dipstick - TSH  4. Routine general medical examination at a health care facility - Basic metabolic panel - CBC with Differential/Platelet - EKG 12-Lead - Hemoglobin A1c - Hepatic function panel - Lipid panel - POCT urinalysis dipstick - TSH - lisinopril (PRINIVIL,ZESTRIL) 10 MG tablet; Take 1 tablet (10 mg total) by mouth daily.  Dispense: 90 tablet; Refill: 1 - Vitamin D, 25-hydroxy - Follow up in one year for MWE - Follow up sooner if needed - Needs to change diet and start exercising more.    Return precautions advised.   Dorothyann Peng, AGNP   Ms. Frasier , Thank you for taking time to come for your Medicare Wellness Visit. I appreciate your ongoing commitment to your  health goals. Please review the following plan we discussed and let me know if I can assist you in the future.   These are the goals we discussed: Goals    . Exercise 150 minutes per week (moderate activity)    . Reduce portion size    . Reduce salt intake to 2 grams per day or less    . Reduce sugar intake to X grams per day       This is a list of the screening recommended for you and due dates:  Health  Maintenance  Topic Date Due  . Shingles Vaccine  03/13/2001  . DEXA scan (bone density measurement)  03/13/2006  . Eye exam for diabetics  03/11/2012  . Urine Protein Check  10/14/2013  . Complete foot exam   10/07/2014  . Pneumonia vaccines (2 of 2 - PPSV23) 10/07/2014  . Hemoglobin A1C  02/24/2015  . Flu Shot  08/27/2015  . Mammogram  10/26/2015  . Colon Cancer Screening  04/01/2023  . Tetanus Vaccine  10/07/2023     Pre visit review using our clinic review tool, if applicable. No additional management support is needed unless otherwise documented below in the visit note.

## 2014-11-22 ENCOUNTER — Telehealth: Payer: Self-pay | Admitting: Adult Health

## 2014-11-22 NOTE — Telephone Encounter (Signed)
Spoke to patient on the phone and informed her of her lab results.

## 2014-11-22 NOTE — Telephone Encounter (Signed)
Left VM to call back regarding labs  

## 2014-12-04 ENCOUNTER — Encounter: Payer: Self-pay | Admitting: Internal Medicine

## 2014-12-04 ENCOUNTER — Ambulatory Visit (INDEPENDENT_AMBULATORY_CARE_PROVIDER_SITE_OTHER): Payer: Medicare Other | Admitting: Internal Medicine

## 2014-12-04 VITALS — BP 132/80 | Temp 98.0°F | Resp 12 | Wt 142.8 lb

## 2014-12-04 DIAGNOSIS — Z794 Long term (current) use of insulin: Secondary | ICD-10-CM

## 2014-12-04 DIAGNOSIS — E119 Type 2 diabetes mellitus without complications: Secondary | ICD-10-CM | POA: Diagnosis not present

## 2014-12-04 NOTE — Progress Notes (Signed)
Patient ID: Katrina Garrison, female   DOB: 03-05-1940, 74 y.o.   MRN: [id number]  HPI: Katrina Melteraulette Winget is a 74 y.o.-year-old female, returning for f/u for DM2, dx 2009, insulin-dependent since 2012-13, uncontrolled, without chronic complications, + hospitalization for diabetic coma x 2. Last visit 3.5 mo ago (did not come back in 1.5 mo as recommended).   Latest hemoglobin A1c: Lab Results  Component Value Date   HGBA1C 10.1* 11/21/2014   HGBA1C 9.0 08/24/2014   HGBA1C 9.8* 04/13/2014  Anti-islet cell and anti-GAD antibodies were undetectable.  Patient is on the following regimen (but not compliant - when compliant, HbA1c in the 7's): - she asks me if she needs to take both insulins every day... - Lantus 40 >> 45 units at night - misses it at least 3x a week  - Humalog after 1/3 meal a day (b'fast)- vials: - 9 units before a small meal - 12 units before a regular meal  - 15 units before a large meal or if you have desert Stopped Metformin in 11/2013 for bloody stools. Stopped Januvia 100 mg daily >> b/c CP. Pt was in the past on a regimen of Humalog 75/25 pen - 50 units daily once a day - but was forgetting the insulin 1/7 days. She was taking the insulin at bedtime >> lows at night and highs, mostly in the 200s but also some in the 300s in am. She is doing better on Levemir.  She was also on Glipizide 10 bid in the past. She had severe diarrhea from metformin before.  Pt checks her sugars 2-4x a day - better this month: - am: 119-130 >> 99-136, 217, 245 >> 69x1, 121-146, few 200s >> 177-257 >> 103-300 >> 142, 182 - 2 hours after breakfast: 356 >> 161 and 186 >> 187 >> n/c >> 123 >> 232 >> 229-280 >> 188, 291 - before lunch: 130s >> 120-130 >> 113-142. 227, 245 >> 72-143 >> 205-224 >> 177-295 >> 153 - 2h after lunch: 161 >> n/c >> 121-152, 227x1 >> 151, 215-262, 328 >> 103-230, 352 >> 177 - before dinner: 146-170 (highest 288) >> 100s >> n/c >> 102-156 >> 98-155 >> 166-303 >>  113-164, 257 >> 153-249 - after dinner: 126 >> n/c >> 134, 163, 245x1 >> 205-384 >> 164 >> 196 - Bedtime: 124-156 >> n/c >> 112-141 >> 140-152, 217x1 >> 192-343 >> 136-160 >> 169, 183 1 low at 69 >> 151; she has hypoglycemia awareness at 60. Highest sugar 302x1 >> 384 >> 300s > 200s  - Pt does not have chronic kidney disease. Last BUN/Cr: Lab Results  Component Value Date   BUN 14 11/21/2014   CREATININE 0.71 11/21/2014  She was on Lisinopril >> changed to Losartan. - she does not have HL. Last Lipid panel: Lab Results  Component Value Date   CHOL 151 11/21/2014   HDL 47.50 11/21/2014   LDLCALC 79 11/21/2014   TRIG 126.0 11/21/2014   CHOLHDL 3 11/21/2014  She is not on a statin. - Pt's last eye exam was in 04/11/2014, with Dr. Earlene Plateravis. No DR, no macular edema. Has cataracts. Had cataract Sx OU in 2015 - Denies numbness and tingling in her legs - Dr. Angelia MouldAdjoury, podiatry. She saw her recently.   She also has a history of asthma, and is on frequent prednisone tapers, last around 02/2012; also HTN;  h/o nephrolithiasis, s/p lithotripsy; GERD.  ROS: Constitutional: + weight gain, + fatigue, no hot flushes, + nocturia Eyes: no blurry vision,  no xerophthalmia ENT: no sore throat, no nodules palpated in throat, no dysphagia/odynophagia, no hoarseness Cardiovascular: no CP/SOB/+ palpitations/+ leg swelling Respiratory: + cough/no SOB/+ wheezing Gastrointestinal: no N/V/D/C Musculoskeletal: no muscle aches/no joint aches Skin: no rashes Neurological: no tremors/numbness/tingling/dizziness, no HAs  I reviewed pt's medications, allergies, PMH, social hx, family hx, and changes were documented in the history of present illness. Otherwise, unchanged from my initial visit note.  PE: BP 132/80 mmHg  Temp(Src) 98 F (36.7 C) (Oral)  Resp 12  Wt 142 lb 12.8 oz (64.774 kg) Body mass index is 27 kg/(m^2). Body mass index is 27 kg/(m^2). Wt Readings from Last 3 Encounters:  12/04/14 142 lb 12.8  oz (64.774 kg)  11/21/14 146 lb (66.225 kg)  10/03/14 144 lb 8 oz (65.545 kg)   Constitutional: normal weight, in NAD, looking younger than stated age Eyes: PERRLA, EOMI, no exophthalmos ENT: moist mucous membranes, no thyromegaly, no cervical lymphadenopathy Cardiovascular: RRR, No MRG Respiratory: CTA B Gastrointestinal: abdomen soft, ND, BS+ Musculoskeletal: no deformities, strength intact in all 4 Skin: moist, warm, no rashes  ASSESSMENT: 1. DM2, insulin-dependent, uncontrolled, without complications She got a rash + itching from Toujeo >> now back on Lantus.   PLAN:  1. Patient with initially initially improved diabetes control after starting to follow the recommendations to take her mealtime insulin 3x a day. However, sugars now worse as she is not compliant with insulins. Lantus is 7$ for her, but Humalog 100$. We repeatedly discussed cheaper alternatives, but she would like to stay with Humalog. She now has enough Humalog at home (vials) - we had a long discussion about THE NEED TO TAKE HER INSULIN AS PRESCRIBED, not when she remembers. - I advised her to:  Patient Instructions  Please come back for a follow-up appointment in 2 months with your log.  Please take Lantus to 45 units at night. YOU NEED TO TAKE THIS EVERY NIGHT. If sugars are <100 in the morning consistently, please decrease the dose by 5-10 units.  Please take Humalog - YOU NEED TO TAKE THIS BEFORE EVERY MEAL. - 12 units before a regular meal  - 15 units before a large meal or if you have desert  Please let me know if the sugars are consistently <80 or >200.  - UTD with eye exams - had flu shot this season - I would have liked to check a C peptide today but CBG too high in the office: 280. Previous check of anti-islet Ab's were negative, but I suspect she is insulin deficient - I will see the patient back in 2 mo with her sugar log

## 2014-12-04 NOTE — Patient Instructions (Addendum)
Please come back for a follow-up appointment in 2 months with your log.  Please take Lantus to 45 units at night. YOU NEED TO TAKE THIS EVERY NIGHT. If sugars are <100 in the morning consistently, please decrease the dose by 5-10 units.  Please take Humalog - YOU NEED TO TAKE THIS BEFORE EVERY MEAL. - 12 units before a regular meal  - 15 units before a large meal or if you have desert  Please let me know if the sugars are consistently <80 or >200.

## 2014-12-05 ENCOUNTER — Ambulatory Visit
Admission: RE | Admit: 2014-12-05 | Discharge: 2014-12-05 | Disposition: A | Discharge status: Home / Self Care | Payer: Medicare Other | Source: Ambulatory Visit | Attending: Adult Health | Admitting: Adult Health

## 2014-12-05 DIAGNOSIS — Z780 Asymptomatic menopausal state: Secondary | ICD-10-CM

## 2014-12-06 ENCOUNTER — Other Ambulatory Visit: Payer: Self-pay

## 2014-12-06 DIAGNOSIS — E2839 Other primary ovarian failure: Secondary | ICD-10-CM

## 2014-12-18 ENCOUNTER — Ambulatory Visit: Payer: Medicare Other | Admitting: Dietician

## 2014-12-19 ENCOUNTER — Ambulatory Visit: Payer: Medicare Other | Admitting: Adult Health

## 2015-01-14 ENCOUNTER — Ambulatory Visit: Payer: Medicare Other | Admitting: Adult Health

## 2015-01-15 ENCOUNTER — Ambulatory Visit: Payer: Medicare Other | Admitting: Adult Health

## 2015-01-15 ENCOUNTER — Ambulatory Visit
Admission: RE | Admit: 2015-01-15 | Discharge: 2015-01-15 | Disposition: A | Discharge status: Home / Self Care | Payer: Medicare Other | Source: Ambulatory Visit | Attending: Adult Health | Admitting: Adult Health

## 2015-01-15 DIAGNOSIS — E2839 Other primary ovarian failure: Secondary | ICD-10-CM

## 2015-02-01 IMAGING — CR DG ABDOMEN 1V
1 series · 1 of 1 positions shown · non-contrast
Comparison: None.

CLINICAL DATA: Left lower quadrant abdomen pain.

EXAM:
ABDOMEN - 1 VIEW

[view not recorded]
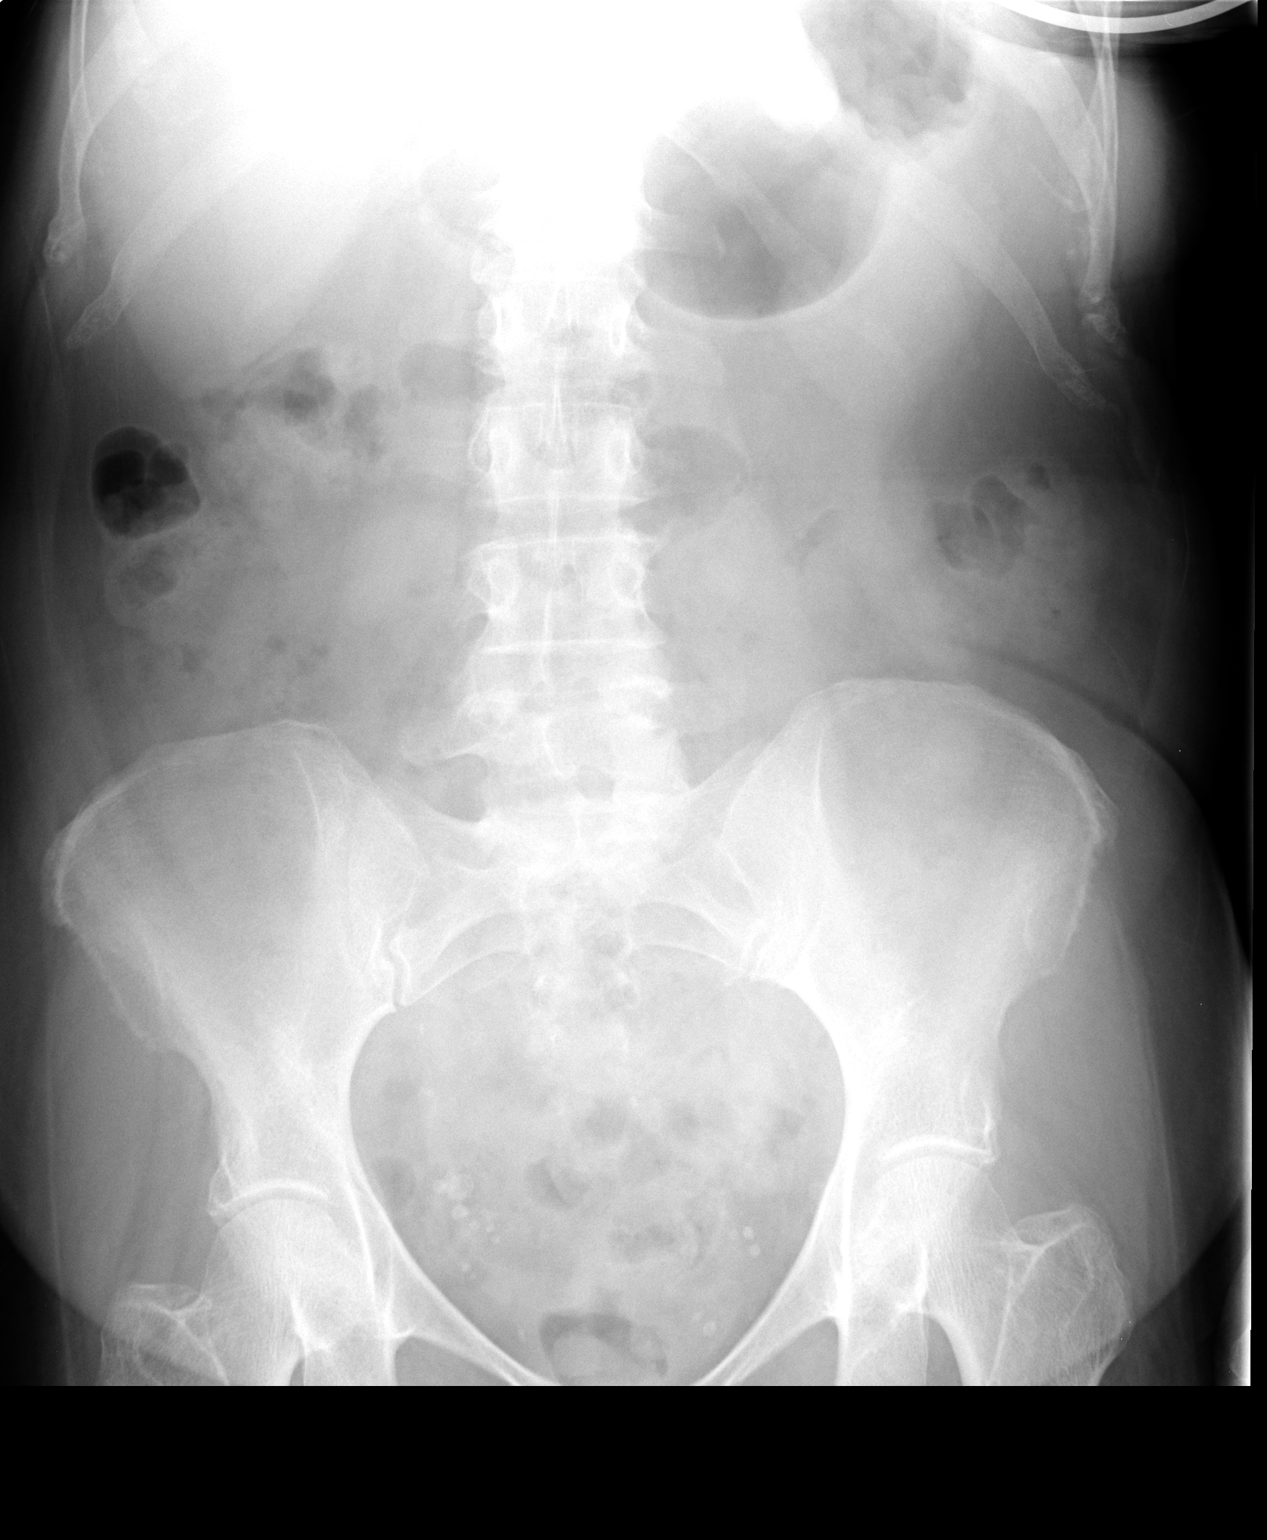

[1 of 1 positions shown; findings below may reference images not displayed]

FINDINGS: The bowel gas pattern is normal. No radio-opaque calculi or other
significant radiographic abnormality are seen.
IMPRESSION: Negative.

## 2015-02-05 ENCOUNTER — Ambulatory Visit: Payer: Medicare Other | Admitting: Internal Medicine

## 2015-02-09 IMAGING — CT CT ABD-PELV W/ CM
2 of 5 series · 15 of 46 positions shown, 17 images · IV contrast (APPLIED)
Comparison: None.

CLINICAL DATA: Left lower quadrant pain, rectal bleeding, evaluate
for diverticulitis

EXAM:
CT ABDOMEN AND PELVIS WITH CONTRAST
TECHNIQUE: Multidetector CT imaging of the abdomen and pelvis was performed
using the standard protocol following bolus administration of
intravenous contrast.
CONTRAST:  100mL OMNIPAQUE IOHEXOL 300 MG/ML  SOLN

[Series 2: abd/ pelvis 5.0 i30f 1 · axial · 0.68mm/px · z∈[+848,+1228]mm · 12 of 86 slices shown, 14 images]
[im 5/86  soft-tissue]
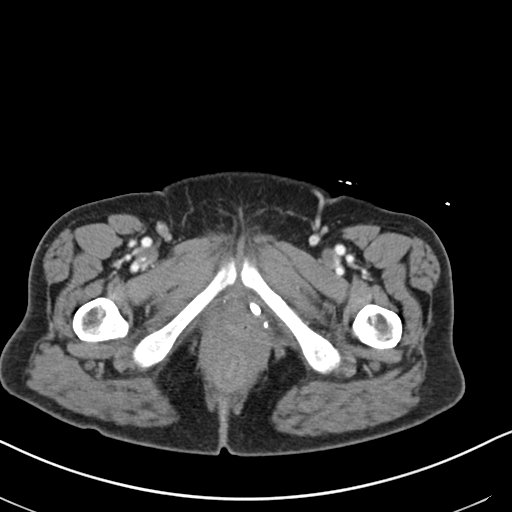
[im 5/86  bone]
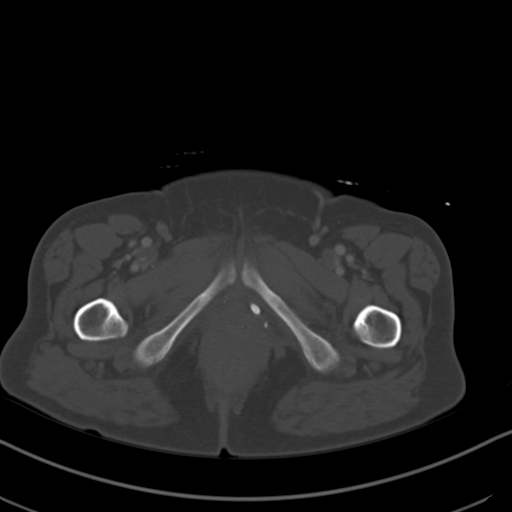
[im 13/86  soft-tissue]
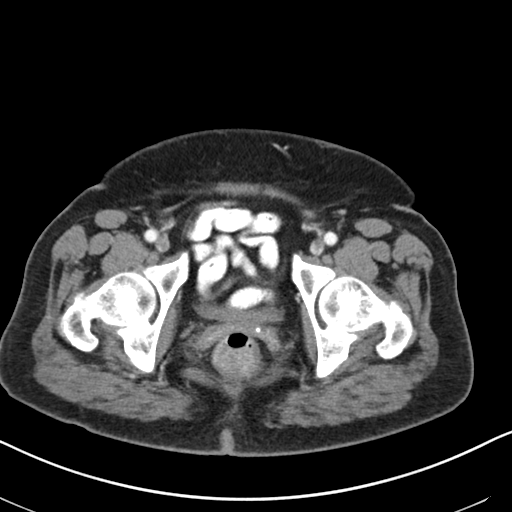
[im 18/86  soft-tissue]
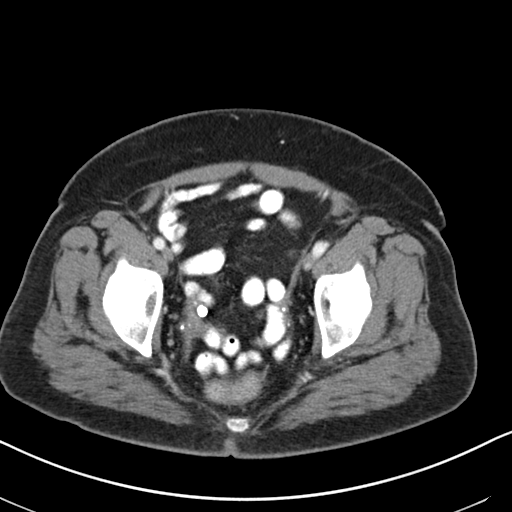
[im 26/86  soft-tissue]
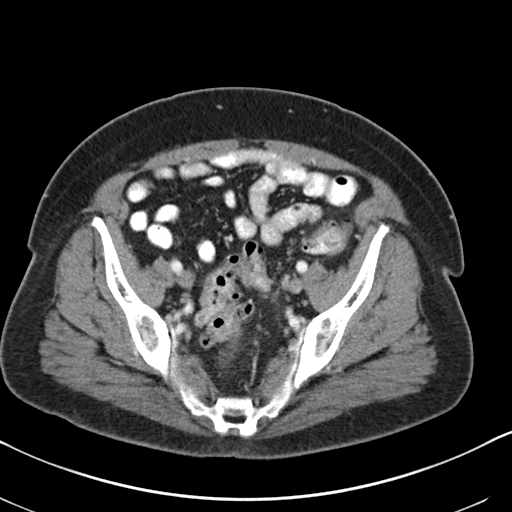
[im 35/86  soft-tissue]
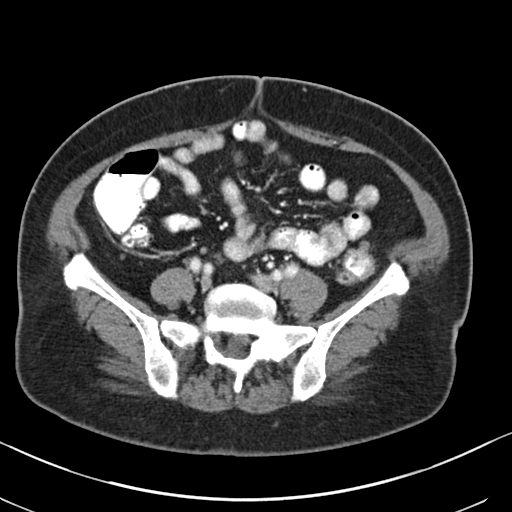
[im 39/86  soft-tissue]
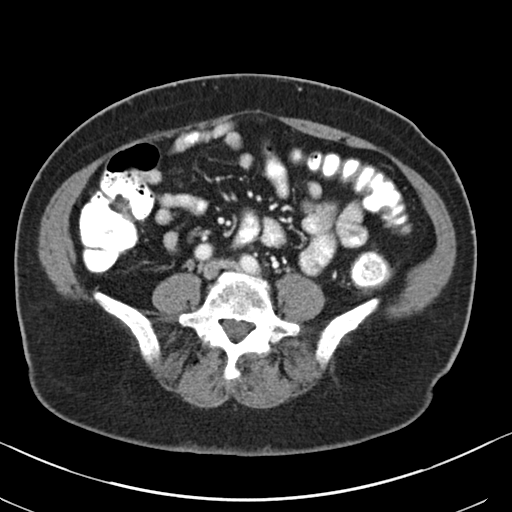
[im 47/86  soft-tissue]
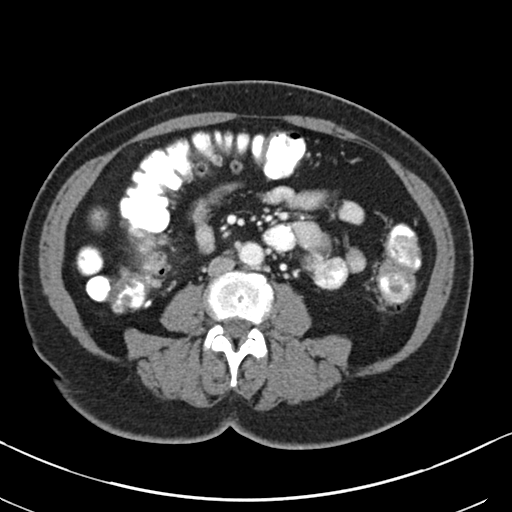
[im 52/86  soft-tissue]
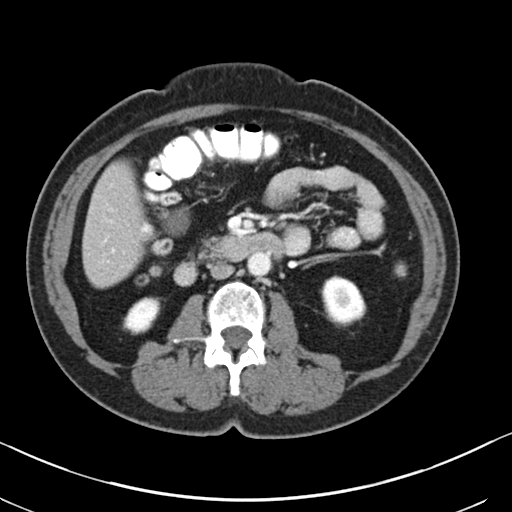
[im 60/86  soft-tissue]
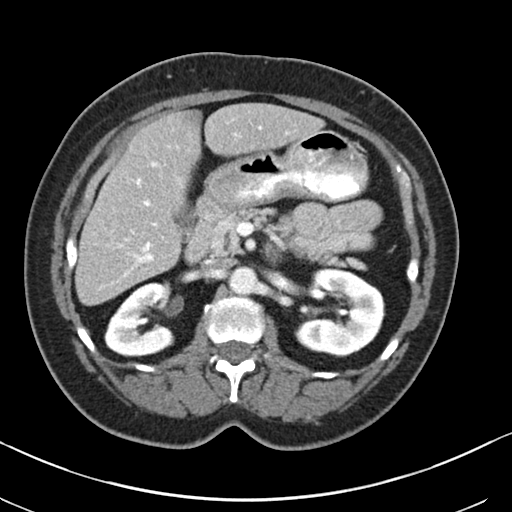
[im 60/86  bone]
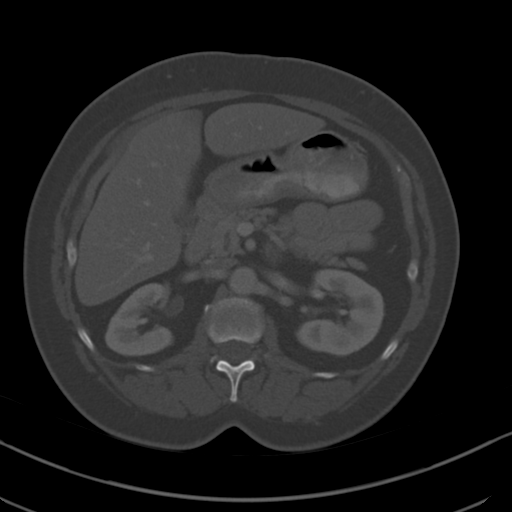
[im 69/86  soft-tissue]
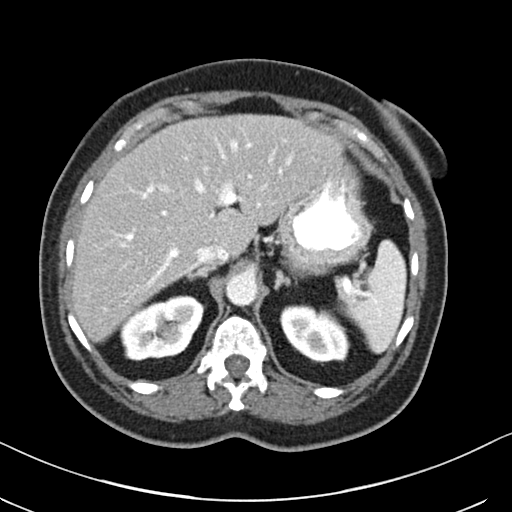
[im 73/86  soft-tissue]
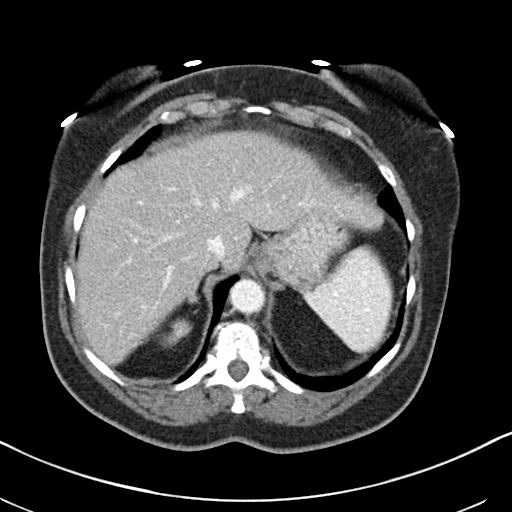
[im 81/86  soft-tissue]
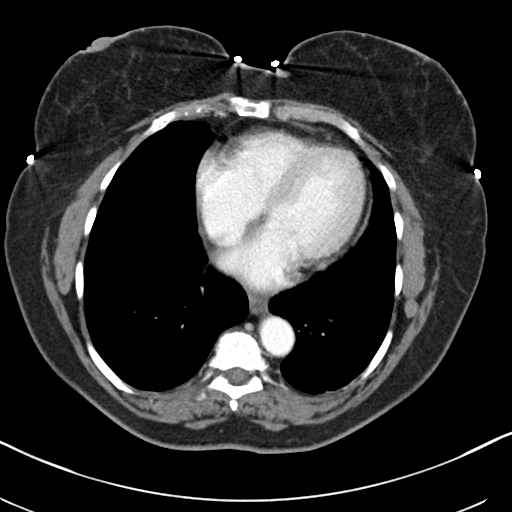

[Series 5: cor · coronal · 0.75mm/px · 3 of 167 slices shown]
[im 56/167  soft-tissue]
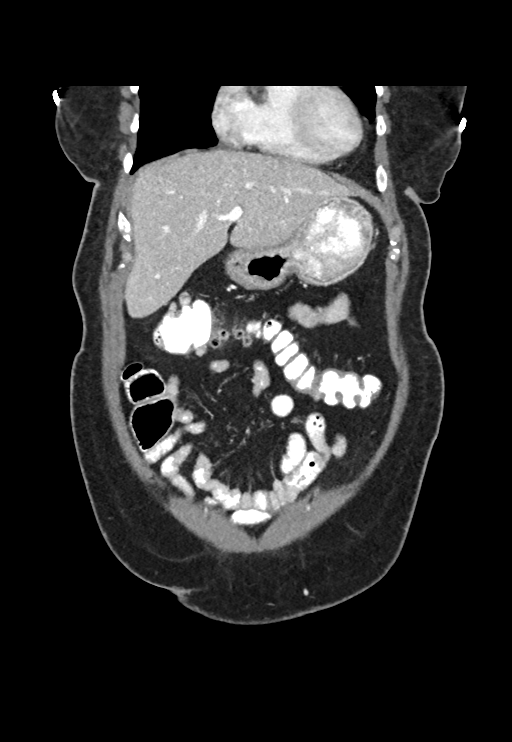
[im 74/167  soft-tissue]
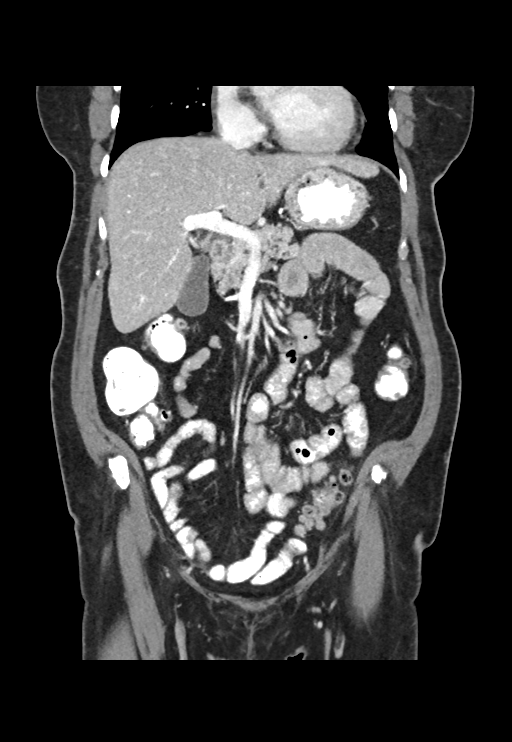
[im 93/167  soft-tissue]
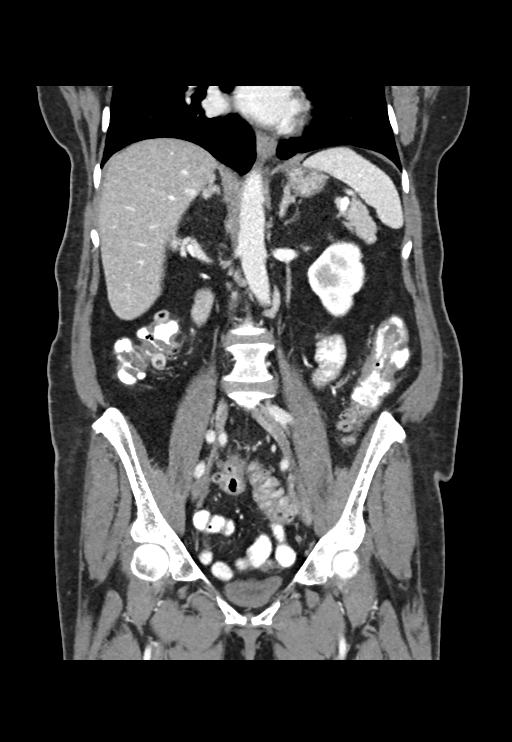

[15 of 46 positions shown; findings below may reference images not displayed]

FINDINGS: Lung bases are clear.

Liver, spleen, pancreas, and adrenal glands are within normal
limits.

Gallbladder is unremarkable. No intrahepatic or extrahepatic ductal
dilatation.

Kidneys are within normal limits.  No hydronephrosis.

No evidence of bowel obstruction. Normal appendix. Extensive colonic
diverticulosis.

Focal wall thickening/inflammatory changes involving the hepatic
flexure (series 2/ image 39) and descending colon (coronal image
94). Two separate locations of acute diverticulitis are possible.
However, neoplasm is not excluded.

Additional mild mucosal thickening along the sigmoid colon is
favored to reflect chronic diverticulosis (series 2/image 63),
although superimposed acute inflammatory change is possible.

No drainable fluid collection/abscess.  No free air.

No evidence of abdominal aortic aneurysm.

Small retroperitoneal nodes measuring up to 6 mm short axis (series
2/ image 41), within normal limits.

Status post hysterectomy.  Bilateral ovaries are unremarkable.

Bladder is within normal limits.

Mild degenerative changes of the visualized thoracolumbar spine.
IMPRESSION: Focal wall thickening/inflammatory changes involving the hepatic
flexure and descending colon, possibly reflecting two separate areas
of acute diverticulitis. However, neoplasm is not excluded.
Follow-up colonoscopy is suggested.

No drainable fluid collection/abscess.  No free air.

No findings suspicious for metastatic disease.

## 2015-02-20 LAB — HM DIABETES EYE EXAM

## 2015-02-22 ENCOUNTER — Encounter: Payer: Self-pay | Admitting: Adult Health

## 2015-02-22 ENCOUNTER — Ambulatory Visit (INDEPENDENT_AMBULATORY_CARE_PROVIDER_SITE_OTHER): Payer: Medicare Other | Admitting: Adult Health

## 2015-02-22 VITALS — BP 144/84 | Temp 99.0°F | Ht 61.0 in | Wt 143.7 lb

## 2015-02-22 DIAGNOSIS — E119 Type 2 diabetes mellitus without complications: Secondary | ICD-10-CM | POA: Diagnosis not present

## 2015-02-22 DIAGNOSIS — I10 Essential (primary) hypertension: Secondary | ICD-10-CM

## 2015-02-22 DIAGNOSIS — Z794 Long term (current) use of insulin: Secondary | ICD-10-CM

## 2015-02-22 MED ORDER — LOSARTAN POTASSIUM-HCTZ 100-25 MG PO TABS: 1.0000 | ORAL_TABLET | Freq: Every day | ORAL | Status: DC

## 2015-02-22 NOTE — Patient Instructions (Addendum)
As always, it is great seeing you   I have sent in a new prescription for Hyzaar 100-25mg . This is the only blood pressure medication you need to take.   Talk with Dr. Elvera Lennox about changing your insulin to a cheaper option such as Basaglar.   Follow up with me in one month

## 2015-02-22 NOTE — Progress Notes (Signed)
Subjective:    Patient ID: Katrina Garrison, female    DOB: 1940/03/03, 75 y.o.   MRN: [id number]  HPI  75 year old female who presents to the office today for follow up regarding hypertension and diabetes. I last saw Katrina Garrison in October 2016 for her complete physical exam. At that time we restarted her on Lisinopril 10 mg.   Today in the office she reports:  1) Hypertension  - She is not taking prescribed lisinopril nor is she taking Norvasc. The only blood pressure medication she is currently taking is Hyzaar 50-12.5 mg. In the office today her blood pressure is 144/84. She has not been monitoring at home. She denies any headaches, blurred vision or dizziness.   2) Uncontrolled Diabetes Type II - She reports " I have changed my life in 2017, I have really started to eat better." She reports that she has cut down on fried foods and foods high in fat. She is making a grocery list and trying to stay true to her diet.She reports that her blood sugars have been in the 150-180's. After the first of the year, Lantus went up in price and she is having a hard time affording both Lantus and Humalog. She had her diabetic eye exam yesterday and reports that " everything was fine."   Her family would like to know if she needs to continue to see Endocrinology or if she can come see me for her diabetes management.     Review of Systems  Constitutional: Negative.   Respiratory: Negative.   Endocrine: Negative.   Genitourinary: Negative.   Neurological: Negative.   All other systems reviewed and are negative.  Past Medical History  Diagnosis Date  . Diabetes mellitus type II 2005  . GERD (gastroesophageal reflux disease)   . Hypertension   . History of lithotripsy     x3 for kidney stones  . Diverticulosis   . Anemia     Social History   Social History  . Marital Status: Divorced    Spouse Name: N/A  . Number of Children: N/A  . Years of Education: N/A   Occupational History  .  Retired    Social History Main Topics  . Smoking status: Former Smoker -- 1.5 years    Quit date: 01/28/1985  . Smokeless tobacco: Not on file  . Alcohol Use: No  . Drug Use: No  . Sexual Activity: Not on file   Other Topics Concern  . Not on file   Social History Narrative    Past Surgical History  Procedure Laterality Date  . Abdominal hysterectomy    . Childbirth x 2    . Eye surgery      cataract    Family History  Problem Relation Age of Onset  . Thyroid cancer Mother   . Diabetes Mother   . Hypertension Mother   . Alcohol abuse Father   . Hypertension Father   . Multiple sclerosis Daughter   . Multiple sclerosis Daughter   . Multiple sclerosis Sister     Allergies  Allergen Reactions  . Metformin And Related Diarrhea  . Sulfamethoxazole     REACTION: "comatose"    Current Outpatient Prescriptions on File Prior to Visit  Medication Sig Dispense Refill  . aspirin 81 MG tablet Take 81 mg by mouth daily.    . Blood Glucose Monitoring Suppl (RELION CONFIRM GLUCOSE MONITOR) W/DEVICE KIT Use as advised 1 kit 1  . glucose blood (RELION GLUCOSE TEST  STRIPS) test strip Check sugars 3x a day. Dx code: E11.9 200 each 3  . hydrocortisone (ANUSOL-HC) 25 MG suppository Place 1 suppository (25 mg total) rectally 2 (two) times daily as needed for hemorrhoids (for bright red rectal bleeding.). 12 suppository 0  . ibuprofen (ADVIL,MOTRIN) 200 MG tablet Take 200 mg by mouth every 6 (six) hours as needed.      . Insulin Glargine (LANTUS SOLOSTAR) 100 UNIT/ML Solostar Pen Inject 45 Units into the skin at bedtime. (Patient taking differently: Inject 40 Units into the skin at bedtime. ) 5 pen 2  . insulin lispro (HUMALOG KWIKPEN) 100 UNIT/ML KiwkPen Inject 12-15 units a day as advised 5 pen 1  . insulin lispro (HUMALOG KWIKPEN) 100 UNIT/ML KiwkPen Inject 0.12-0.15 mLs (12-15 Units total) into the skin 3 (three) times daily. 15 mL 2  . Insulin Pen Needle (RELION MINI PEN NEEDLES)  31G X 6 MM MISC USE AS DIRECTED THREE TIMES DAILY 200 each 0  . pyridOXINE (VITAMIN B-6) 100 MG tablet Take 100 mg by mouth daily.    Marland Kitchen RELION LANCETS MICRO-THIN 33G MISC Check sugars 3x a day 200 each 3  . vitamin E 400 UNIT capsule Take 400 Units by mouth daily.     No current facility-administered medications on file prior to visit.    BP 144/84 mmHg  Temp(Src) 99 F (37.2 C) (Oral)  Ht _0  (1.549 m)  Wt 143 lb 11.2 oz (65.182 kg)  BMI 27.17 kg/m2       Objective:   Physical Exam  Constitutional: She is oriented to person, place, and time. She appears well-developed and well-nourished. No distress.  Cardiovascular: Normal rate, regular rhythm, normal heart sounds and intact distal pulses.  Exam reveals no gallop and no friction rub.   No murmur heard. Pulmonary/Chest: Effort normal and breath sounds normal. No respiratory distress. She has no wheezes. She has no rales. She exhibits no tenderness.  Neurological: She is alert and oriented to person, place, and time.  Skin: Skin is warm and dry. No rash noted. She is not diaphoretic. No erythema. No pallor.  Psychiatric: She has a normal mood and affect. Her behavior is normal. Judgment and thought content normal.  Nursing note and vitals reviewed.     Assessment & Plan:  1. Essential hypertension - losartan-hydrochlorothiazide (HYZAAR) 100-25 MG tablet; Take 1 tablet by mouth daily.  Dispense: 30 tablet; Refill: 3 - Needs to monitor at home and bring log with her to next visit.  - Follow up in one month  2. Type 2 diabetes mellitus without complication, with long-term current use of insulin ([place]) - I get conflicting reports between Katrina Garrison and her sister, whom I see as well. Per Katrina Garrison's sister, she has not made any dietary changes, continues to eat foods high in fat and fried foods. Which is likely the case.  - I continue to stress the importance of diet and exercise  - She needs to stay with Katrina Garrison for her diabetes  management. I advised her to ask Katrina Garrison about possibly prescribing Basaglar instead of Lantus. Savings card and sample pen given to patient.

## 2015-03-08 ENCOUNTER — Ambulatory Visit (INDEPENDENT_AMBULATORY_CARE_PROVIDER_SITE_OTHER): Payer: Medicare Other | Admitting: Internal Medicine

## 2015-03-08 ENCOUNTER — Encounter: Payer: Self-pay | Admitting: Internal Medicine

## 2015-03-08 ENCOUNTER — Other Ambulatory Visit (INDEPENDENT_AMBULATORY_CARE_PROVIDER_SITE_OTHER): Payer: Medicare Other | Admitting: *Deleted

## 2015-03-08 VITALS — BP 138/80 | Temp 98.4°F | Resp 12 | Wt 145.0 lb

## 2015-03-08 DIAGNOSIS — Z794 Long term (current) use of insulin: Secondary | ICD-10-CM

## 2015-03-08 DIAGNOSIS — E119 Type 2 diabetes mellitus without complications: Secondary | ICD-10-CM

## 2015-03-08 DIAGNOSIS — E1165 Type 2 diabetes mellitus with hyperglycemia: Secondary | ICD-10-CM | POA: Insufficient documentation

## 2015-03-08 LAB — POCT GLYCOSYLATED HEMOGLOBIN (HGB A1C): HEMOGLOBIN A1C: 9.1

## 2015-03-08 MED ORDER — INSULIN GLARGINE 100 UNIT/ML SOLOSTAR PEN: 45.0000 [IU] | PEN_INJECTOR | Freq: Every day | SUBCUTANEOUS | Status: DC

## 2015-03-08 NOTE — Progress Notes (Signed)
Patient ID: Katrina Garrison, female   DOB: October 21, 1940, 75 y.o.   MRN: [id number]  HPI: Katrina Garrison is a 75 y.o.-year-old female, returning for f/u for DM2, dx 2009, insulin-dependent since 2012-13, uncontrolled, without chronic complications, + hospitalization for diabetic coma x 2. Last visit 3 mo ago.  In January 2017 she started to take the insulin as Rx'ed and to start improving her diet after a family meeting with her children. Sugars are better!  Latest hemoglobin A1c: Lab Results  Component Value Date   HGBA1C 10.1* 11/21/2014   HGBA1C 9.0 08/24/2014   HGBA1C 9.8* 04/13/2014  Anti-islet cell and anti-GAD antibodies were undetectable.  Patient is on the following regimen (but not compliant - when compliant, HbA1c in the 7's): - Lantus 40 units at night - not forgetting doses anymore - Humalog vials: - 12 units before a regular meal  - 15 units before a large meal or if you have desert Stopped Metformin in 11/2013 for bloody stools. Stopped Januvia 100 mg daily >> b/c CP. Pt was in the past on a regimen of Humalog 75/25 pen - 50 units daily once a day - but was forgetting the insulin 1/7 days. She was taking the insulin at bedtime >> lows at night and highs, mostly in the 200s but also some in the 300s in am. She is doing better on Levemir.  She was also on Glipizide 10 bid in the past. She had severe diarrhea from metformin before.  Pt checks her sugars 2-4x a day - better: - am: 119-130 >> 99-136, 217, 245 >> 69x1, 121-146, few 200s >> 177-257 >> 103-300 >> 142, 182 >> 136-186 - 2 hours after breakfast: 356 >> 161 and 186 >> 187 >> n/c >> 123 >> 232 >> 229-280 >> 188, 291 >> 169 - before lunch: 130s >> 120-130 >> 113-142. 227, 245 >> 72-143 >> 205-224 >> 177-295 >> 153 >> 115-169 - 2h after lunch: 161 >> n/c >> 121-152, 227x1 >> 151, 215-262, 328 >> 103-230, 352 >> 177 >> 121 - before dinner: 100s >> n/c >> 102-156 >> 98-155 >> 166-303 >> 113-164, 257 >> 153-249 >> 162 -  after dinner: 126 >> n/c >> 134, 163, 245x1 >> 205-384 >> 164 >> 196 >> 115-171 - Bedtime: 124-156 >> n/c >> 112-141 >> 140-152, 217x1 >> 192-343 >> 136-160 >> 169, 183 >> 122-172 1 low at 69 >> 151; she has hypoglycemia awareness at 60. Highest sugar 302x1 >> 384 >> 300s > 200s >> 169  - Pt does not have chronic kidney disease. Last BUN/Cr: Lab Results  Component Value Date   BUN 14 11/21/2014   CREATININE 0.71 11/21/2014  She was on Lisinopril >> changed to Losartan. - she does not have HL. Last Lipid panel: Lab Results  Component Value Date   CHOL 151 11/21/2014   HDL 47.50 11/21/2014   LDLCALC 79 11/21/2014   TRIG 126.0 11/21/2014   CHOLHDL 3 11/21/2014  She is not on a statin. - Pt's last eye exam was in 02/20/2015. No DR, no macular edema. Has cataracts. Had cataract Sx OU in 2015 - Denies numbness and tingling in her legs - Dr. Angelia Mould, podiatry.   She also has a history of asthma, and is on frequent prednisone tapers, last around 02/2012; also HTN;  h/o nephrolithiasis, s/p lithotripsy; GERD.  ROS: Constitutional: no weight gain/loss, no fatigue, no subjective hyperthermia/hypothermia Eyes: no blurry vision, no xerophthalmia ENT: no sore throat, no nodules palpated in throat, no dysphagia/odynophagia,  no hoarseness Cardiovascular: no CP/SOB/palpitations/leg swelling Respiratory: no cough/SOB Gastrointestinal: no N/V/D/C Musculoskeletal: no muscle/joint aches Skin: no rashes Neurological: no tremors/numbness/tingling/dizziness  I reviewed pt's medications, allergies, PMH, social hx, family hx, and changes were documented in the history of present illness. Otherwise, unchanged from my initial visit note.  PE: BP 138/80 mmHg  Temp(Src) 98.4 F (36.9 C) (Oral)  Resp 12  Wt 145 lb (65.772 kg) Body mass index is 27.41 kg/(m^2). Body mass index is 27.41 kg/(m^2). Wt Readings from Last 3 Encounters:  03/08/15 145 lb (65.772 kg)  02/22/15 143 lb 11.2 oz (65.182 kg)   12/04/14 142 lb 12.8 oz (64.774 kg)   Constitutional: normal weight, in NAD, looking younger than stated age Eyes: PERRLA, EOMI, no exophthalmos ENT: moist mucous membranes, no thyromegaly, no cervical lymphadenopathy Cardiovascular: RRR, No MRG Respiratory: CTA B Gastrointestinal: abdomen soft, ND, BS+ Musculoskeletal: no deformities, strength intact in all 4 Skin: moist, warm, no rashes  ASSESSMENT: 1. DM2, insulin-dependent, uncontrolled, without complications She got a rash + itching from Toujeo >> now back on Lantus.   PLAN:  1. Patient with improved diabetes control after starting to follow the recommendations to take her mealtime insulin 3x a day.  - we had a long discussion at last visit about THE NEED TO TAKE HER INSULIN AS PRESCRIBED, not when she remembers. She started to do so last month >> sugars are better - will keep Humalog doses thew same but increase Lantus as sugars higher in am >> will also switch to Basaglar as this is more affordable - I advised her to:  Patient Instructions  Please stop: - Lantus  Start: - Basaglar and increase 45 units at night.  Continue: - Humalog - BEFORE EVERY MEAL. - 12 units before a regular meal  - 15 units before a large meal or if you have dessert  Please let me know if the sugars are consistently <80 or >200.  Please come back for a follow-up appointment in 3 months with your log.  - UTD with eye exams - had flu shot this season - checked HbA1c today >> 9.1% (better, but still high) - I will see the patient back in 3 mo with her sugar log

## 2015-03-08 NOTE — Patient Instructions (Addendum)
Please stop: - Lantus  Start: - Basaglar and increase 45 units at night.  Continue: - Humalog - BEFORE EVERY MEAL. - 12 units before a regular meal  - 15 units before a large meal or if you have dessert  Please let me know if the sugars are consistently <80 or >200.  Please come back for a follow-up appointment in 3 months with your log.

## 2015-03-12 ENCOUNTER — Other Ambulatory Visit: Payer: Self-pay | Admitting: Internal Medicine

## 2015-03-22 ENCOUNTER — Encounter: Payer: Self-pay | Admitting: Adult Health

## 2015-03-22 ENCOUNTER — Ambulatory Visit (INDEPENDENT_AMBULATORY_CARE_PROVIDER_SITE_OTHER): Payer: Medicare Other | Admitting: Adult Health

## 2015-03-22 VITALS — BP 160/80 | HR 97 | Temp 98.0°F | Wt 148.6 lb

## 2015-03-22 DIAGNOSIS — E1165 Type 2 diabetes mellitus with hyperglycemia: Secondary | ICD-10-CM

## 2015-03-22 DIAGNOSIS — E118 Type 2 diabetes mellitus with unspecified complications: Secondary | ICD-10-CM

## 2015-03-22 DIAGNOSIS — I10 Essential (primary) hypertension: Secondary | ICD-10-CM | POA: Diagnosis not present

## 2015-03-22 DIAGNOSIS — Z794 Long term (current) use of insulin: Secondary | ICD-10-CM | POA: Diagnosis not present

## 2015-03-22 DIAGNOSIS — IMO0002 Reserved for concepts with insufficient information to code with codable children: Secondary | ICD-10-CM

## 2015-03-22 MED ORDER — LISINOPRIL 5 MG PO TABS: 5.0000 mg | ORAL_TABLET | Freq: Every day | ORAL | Status: DC

## 2015-03-22 NOTE — Progress Notes (Signed)
Pre visit review using our clinic review tool, if applicable. No additional management support is needed unless otherwise documented below in the visit note. 

## 2015-03-22 NOTE — Progress Notes (Signed)
Subjective:    Patient ID: Katrina Garrison, female    DOB: 1940-11-25, 75 y.o.   MRN: [id number]  HPI  75 year old female who presents to the office for one month follow up regarding hypertension. I last saw her on 02/22/2015 at which time we increased her Hyzaar to 100-60m. She reports that she started this new dose sometime in February. Over the last week her diet has been high in sugars, carbs and sodium.   Blood pressure in the office today was 160/80 on two separate occassions. She denies any headaches or blurred vision.  BP Readings from Last 3 Encounters:  03/22/15 160/80  03/08/15 138/80  02/22/15 144/84   She has seen Dr. GCruzita Ledererrecently. At that time her A1c had improved to 9.1. She was started on Basaglar as it was more affordable that Lantus.   She reports that her blood sugars have been in the 180-200's.  Review of Systems  Constitutional: Negative.   Respiratory: Negative.   Cardiovascular: Negative.   Musculoskeletal: Negative.   Neurological: Negative.   All other systems reviewed and are negative.  Past Medical History  Diagnosis Date  . Diabetes mellitus type II 2005  . GERD (gastroesophageal reflux disease)   . Hypertension   . History of lithotripsy     x3 for kidney stones  . Diverticulosis   . Anemia     Social History   Social History  . Marital Status: Divorced    Spouse Name: N/A  . Number of Children: N/A  . Years of Education: N/A   Occupational History  . Retired    Social History Main Topics  . Smoking status: Former Smoker -- 1.5 years    Quit date: 01/28/1985  . Smokeless tobacco: Not on file  . Alcohol Use: No  . Drug Use: No  . Sexual Activity: Not on file   Other Topics Concern  . Not on file   Social History Narrative    Past Surgical History  Procedure Laterality Date  . Abdominal hysterectomy    . Childbirth x 2    . Eye surgery      cataract    Family History  Problem Relation Age of Onset  . Thyroid  cancer Mother   . Diabetes Mother   . Hypertension Mother   . Alcohol abuse Father   . Hypertension Father   . Multiple sclerosis Daughter   . Multiple sclerosis Daughter   . Multiple sclerosis Sister     Allergies  Allergen Reactions  . Metformin And Related Diarrhea  . Sulfamethoxazole     REACTION: "comatose"    Current Outpatient Prescriptions on File Prior to Visit  Medication Sig Dispense Refill  . aspirin 81 MG tablet Take 81 mg by mouth daily.    . Blood Glucose Monitoring Suppl (RELION CONFIRM GLUCOSE MONITOR) W/DEVICE KIT Use as advised 1 kit 1  . Calcium Carbonate-Vit D-Min (CALCIUM 1200) 1200-1000 MG-UNIT CHEW Chew 1 capsule by mouth daily.    .Marland Kitchenglucose blood (RELION GLUCOSE TEST STRIPS) test strip Check sugars 3x a day. Dx code: E11.9 200 each 3  . Insulin Glargine (BASAGLAR KWIKPEN) 100 UNIT/ML Solostar Pen Inject 45 Units into the skin daily at 10 pm. 45 mL 2  . insulin lispro (HUMALOG KWIKPEN) 100 UNIT/ML KiwkPen Inject 0.12-0.15 mLs (12-15 Units total) into the skin 3 (three) times daily. 15 mL 2  . losartan-hydrochlorothiazide (HYZAAR) 100-25 MG tablet Take 1 tablet by mouth daily. 30 tablet 3  .  pyridOXINE (VITAMIN B-6) 100 MG tablet Take 100 mg by mouth daily.    Marland Kitchen RELION LANCETS MICRO-THIN 33G MISC Check sugars 3x a day 200 each 3  . RELION MINI PEN NEEDLES 31G X 6 MM MISC USE AS DIRECTED THREE TIMES DAILY 200 each 1  . Vitamin D, Cholecalciferol, 400 units CAPS Take 800 mg by mouth daily.    Marland Kitchen ibuprofen (ADVIL,MOTRIN) 200 MG tablet Take 200 mg by mouth every 6 (six) hours as needed. Reported on 03/22/2015     No current facility-administered medications on file prior to visit.    BP 160/80 mmHg  Pulse 97  Temp(Src) 98 F (36.7 C) (Oral)  Wt 148 lb 9.6 oz (67.405 kg)       Objective:   Physical Exam  Constitutional: She is oriented to person, place, and time. She appears well-developed and well-nourished. No distress.  Cardiovascular: Normal rate,  regular rhythm, normal heart sounds and intact distal pulses.  Exam reveals no gallop and no friction rub.   No murmur heard. Pulmonary/Chest: Effort normal and breath sounds normal. No respiratory distress. She has no wheezes. She has no rales. She exhibits no tenderness.  Neurological: She is alert and oriented to person, place, and time.  Skin: Skin is warm and dry. No rash noted. She is not diaphoretic. No erythema. No pallor.  Psychiatric: She has a normal mood and affect. Her behavior is normal. Judgment and thought content normal.  Nursing note and vitals reviewed.     Assessment & Plan:  1. Uncontrolled type 2 diabetes mellitus with complication, with long-term current use of insulin ([place]) - We discussed diabetic diets. She does not seem to know what she can and cannot eat  Advised to cut out sugars and carbs. She can look online for Diabetic Meal Plans.  - Amb Referral to Nutrition and Diabetic E  2. Essential hypertension - lisinopril (PRINIVIL,ZESTRIL) 5 MG tablet; Take 1 tablet (5 mg total) by mouth daily.  Dispense: 90 tablet; Refill: 3 - Likely due to poor diet and possibly non compliance with medications. - Follow up in 2 weeks

## 2015-03-22 NOTE — Patient Instructions (Signed)
It was great seeing you again.   Please fill the lisinopril 5 mg and start taking it at night.   Follow up with me in 2 weeks.

## 2015-03-26 ENCOUNTER — Encounter: Payer: Medicare Other | Attending: Adult Health | Admitting: Dietician

## 2015-03-26 ENCOUNTER — Encounter: Payer: Self-pay | Admitting: Dietician

## 2015-03-26 VITALS — Ht 62.0 in | Wt 146.0 lb

## 2015-03-26 DIAGNOSIS — Z794 Long term (current) use of insulin: Secondary | ICD-10-CM | POA: Insufficient documentation

## 2015-03-26 DIAGNOSIS — E118 Type 2 diabetes mellitus with unspecified complications: Secondary | ICD-10-CM

## 2015-03-26 DIAGNOSIS — E119 Type 2 diabetes mellitus without complications: Secondary | ICD-10-CM | POA: Insufficient documentation

## 2015-03-26 NOTE — Patient Instructions (Signed)
Plan:  Aim for 2-3 Carb Choices per meal (30-45 grams) +/- 1 either way  Aim for 0-1 Carbs per snack if hungry  Include protein in moderation with your meals and snacks Consider reading food labels for Total Carbohydrate and Fat Grams of foods Consider  increasing your activity level by walking for 30 minutes daily as tolerated Consider checking BG at alternate times per day as directed by MD  Consider taking medication as directed by MD Randie [employer] job in cleaning out your pantry/freezer!

## 2015-03-28 NOTE — Progress Notes (Signed)
Diabetes Self-Management Education  Visit Type: First/Initial  Appt. Start Time: 0945 Appt. End Time: 1115  03/28/2015  [name], identified by name and date of birth, is a 75 y.o. female with a diagnosis of Diabetes: Type 2. Other hx includes HTN, and GERD. She has had type 2 diabetes since 2009 with insulin since 2012/13.  Weight today 146 lbs which is stable.  She is currently taking:  Lantus 45 units q HS and Humalog 12-15 units depending on the size of the meal before breakfast, lunch, and dinner.  She is a patient of Dr. Elvera Lennox.    Patient lives alone and is very active.  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  She has two daughters who take care of her finances and are very supportive.   ASSESSMENT  Height  (1.575 m), weight 146 lb (66.225 kg). Body mass index is 26.7 kg/(m^2).      Diabetes Self-Management Education - 03/26/15 0956    Visit Information   Visit Type First/Initial   Initial Visit   Diabetes Type Type 2   Are you currently following a meal plan? No   Are you taking your medications as prescribed? Yes   Date Diagnosed 2010?   Health Coping   How would you rate your overall health? Excellent   Psychosocial Assessment   Patient Belief/Attitude about Diabetes Motivated to manage diabetes   Self-care barriers None   Self-management support Doctor's office;Friends;Family   Other persons present Patient   Patient Concerns Nutrition/Meal planning   Special Needs None   Preferred Learning Style No preference indicated   Learning Readiness Ready   How often do you need to have someone help you when you read instructions, pamphlets, or other written materials from your doctor or pharmacy? 1 - Never   What is the last grade level you completed in school? 2 years college   Complications   Last HgB A1C per patient/outside source 9.1 %  2.2017 decreased from 10.1% 10/26.   How often do you check your blood sugar? 1-2  times/day   Fasting Blood glucose range (mg/dL) 161-096   Postprandial Blood glucose range (mg/dL) >045   Number of hypoglycemic episodes per month 0   Number of hyperglycemic episodes per week 21   Can you tell when your blood sugar is high? Yes   What do you do if your blood sugar is high? rests   Have you had a dilated eye exam in the past 12 months? Yes   Have you had a dental exam in the past 12 months? Yes   Are you checking your feet? Yes   How many days per week are you checking your feet? 7   Dietary Intake   Breakfast boiled egg   11:30 am because she walks first   Lunch fruit, [place] sandwich on [employer] bread with peppers, onions, tomato, mayo OR pork chop on [employer] bread   2 pm   Dinner salmon, brown rice, spinach, walnuts  6-7 pm   Snack (evening) raisin bran or cheerios or rice krispies, or cornflakes and skim    Beverage(s) water, unsweetened green tea, sweet tea   Patient Education   Previous Diabetes Education No   Disease state  Definition of diabetes, type 1 and 2, and the diagnosis of diabetes;Factors that contribute to the development of diabetes;Explored patient's options for treatment of their diabetes   Nutrition management  Role of diet in  the treatment of diabetes and the relationship between the three main macronutrients and blood glucose level;Food label reading, portion sizes and measuring food.;Meal options for control of blood glucose level and chronic complications.   Physical activity and exercise  Role of exercise on diabetes management, blood pressure control and cardiac health.   Monitoring Identified appropriate SMBG and/or A1C goals.   Chronic complications Relationship between chronic complications and blood glucose control;Dental care;Retinopathy and reason for yearly dilated eye exams   Psychosocial adjustment Worked with patient to identify barriers to care and solutions;Role of stress on diabetes   Individualized Goals (developed by patient)   Nutrition  General guidelines for healthy choices and portions discussed   Physical Activity Exercise 3-5 times per week;30 minutes per day   Medications take my medication as prescribed   Monitoring  test my blood glucose as discussed   Reducing Risk examine blood glucose patterns   Outcomes   Expected Outcomes Demonstrated interest in learning. Expect positive outcomes   Future DMSE PRN   Program Status Completed      Individualized Plan for Diabetes Self-Management Training:   Learning Objective:  Patient will have a greater understanding of diabetes self-management. Patient education plan is to attend individual and/or group sessions per assessed needs and concerns.   Plan:   Patient Instructions  Plan:  Aim for 2-3 Carb Choices per meal (30-45 grams) +/- 1 either way  Aim for 0-1 Carbs per snack if hungry  Include protein in moderation with your meals and snacks Consider reading food labels for Total Carbohydrate and Fat Grams of foods Consider  increasing your activity level by walking for 30 minutes daily as tolerated Consider checking BG at alternate times per day as directed by MD  Consider taking medication as directed by MD [employer]ie [employer] job in cleaning out your pantry/freezer!    Expected Outcomes:  Demonstrated interest in learning. Expect positive outcomes  Education material provided: Living Well with Diabetes, Food label handouts, A1C conversion sheet, Meal plan card, My Plate and Snack sheet  If problems or questions, patient to contact team via:  Phone and Email  Future DSME appointment: PRN

## 2015-04-05 ENCOUNTER — Ambulatory Visit: Payer: Medicare Other | Admitting: Adult Health

## 2015-04-11 ENCOUNTER — Ambulatory Visit: Payer: Medicare Other | Admitting: Adult Health

## 2015-04-16 ENCOUNTER — Telehealth: Payer: Self-pay | Admitting: Adult Health

## 2015-04-16 ENCOUNTER — Other Ambulatory Visit: Payer: Self-pay | Admitting: Adult Health

## 2015-04-16 ENCOUNTER — Telehealth: Payer: Self-pay | Admitting: Internal Medicine

## 2015-04-16 MED ORDER — METOPROLOL TARTRATE 25 MG PO TABS: 12.5000 mg | ORAL_TABLET | Freq: Two times a day (BID) | ORAL | Status: DC

## 2015-04-16 NOTE — Telephone Encounter (Signed)
Called pt and asked her if she is taking the Humalog, she is and she has not started the Basaglar yet, she is finishing up the Lantus she has. She did not want to waste it. Pt has seen a nutritionist as well. Be advised.

## 2015-04-16 NOTE — Telephone Encounter (Signed)
Pt would like to know if she should continue the losartan-hydrochlorothiazide (HYZAAR) 100-25 MG tablet along with the [facility]    prescribed [facility].

## 2015-04-16 NOTE — Telephone Encounter (Signed)
This should not be the case if she is taking the insulins as prescribed. Please make sure that she is taking the Humalog before every meal and does not forget Basaglar at bedtime.

## 2015-04-16 NOTE — Telephone Encounter (Signed)
Pt [employer] Nurse Practitioner called while she was with the PT today and said that yesterday her fasting blood sugar was 258 and today her fasting level was 272.

## 2015-04-16 NOTE — Telephone Encounter (Signed)
See below and please advise

## 2015-04-16 NOTE — Telephone Encounter (Signed)
Please read message below and advise.  

## 2015-04-16 NOTE — Telephone Encounter (Signed)
Patient notified verbalized understanding

## 2015-04-16 NOTE — Telephone Encounter (Signed)
Pt said nurse is with her and her bp 180/79    She is having swelling inher throat and a cough when she takes her  lisinopril (PRINIVIL,ZESTRIL) 5 MG tablet  She has not taken the lisinopril since 04/14/15    Pt or np would like a call back   Np Cassandra with [facility] [employer] [facility] [employer]  [phone]    04/15/15 her bp  172/65

## 2015-04-16 NOTE — Telephone Encounter (Signed)
Please advise 

## 2015-04-16 NOTE — Telephone Encounter (Signed)
Spoke to patient and informed her that she needs to quit taking Lisinopril. Continue with Hyzaar.   Start Metoprolol 12.5 mg BID

## 2015-04-16 NOTE — Telephone Encounter (Signed)
It doesn't make a difference if she is taking Basaglar Lantus, she just needs not to skip doses. Can she give us any sugar readings for the last week?

## 2015-04-16 NOTE — Telephone Encounter (Signed)
Yes

## 2015-04-18 NOTE — Telephone Encounter (Signed)
Spoke with pt:   3/23  3/22  3/21  3/20  3/18  3/17 253 AM  212 AM 150 AM 182 AM 146 AM 155 AM  All fasting blood sugar readings. [facility] and will start there next week w/ their exercise classes for Seniors.

## 2015-04-18 NOTE — Telephone Encounter (Signed)
I also need sugars after dinner or at bedtime.

## 2015-04-18 NOTE — Telephone Encounter (Signed)
Returned pt's call. Advised her to check sugars the other times as well. Pt will call back on Tuesday to let us know her b/s on Tuesday. Be advised.

## 2015-04-29 ENCOUNTER — Ambulatory Visit: Payer: Medicare Other | Admitting: Family Medicine

## 2015-04-30 ENCOUNTER — Ambulatory Visit: Payer: Medicare Other | Admitting: Adult Health

## 2015-05-15 ENCOUNTER — Encounter: Payer: Self-pay | Admitting: Adult Health

## 2015-05-15 ENCOUNTER — Ambulatory Visit (INDEPENDENT_AMBULATORY_CARE_PROVIDER_SITE_OTHER): Payer: Medicare Other | Admitting: Adult Health

## 2015-05-15 VITALS — BP 160/82 | Temp 98.6°F | Wt 134.6 lb

## 2015-05-15 DIAGNOSIS — I10 Essential (primary) hypertension: Secondary | ICD-10-CM

## 2015-05-15 MED ORDER — METOPROLOL TARTRATE 25 MG PO TABS: 25.0000 mg | ORAL_TABLET | Freq: Two times a day (BID) | ORAL | Status: DC

## 2015-05-15 NOTE — Progress Notes (Signed)
Subjective:    Patient ID: Katrina Garrison, female    DOB: 1940/12/22, 75 y.o.   MRN: [id number]  HPI  75 year old female who presents to the office today to follow up on hypertension. She is currently taking Hyzaar 25-100 and Lopressor 12.5 mg BID. In the office today her BP is 160/82. She has brought her home meter which shows readings between 711-657 systolic. She denies any headaches or blurred vision.   She does report that she is eating healthier and has cut out almost all salt in her diet. She is drinking a lot more water  Per patient " I have finally realized that I cannot keep fighting my medical issues, I have started taking my medications as they have been prescribed to me."   Review of Systems  Constitutional: Negative.   Respiratory: Negative.   Cardiovascular: Negative.   Musculoskeletal: Negative.   Neurological: Negative.   Hematological: Negative.   All other systems reviewed and are negative.  Past Medical History  Diagnosis Date  . Diabetes mellitus type II 2005  . GERD (gastroesophageal reflux disease)   . Hypertension   . History of lithotripsy     x3 for kidney stones  . Diverticulosis   . Anemia     Social History   Social History  . Marital Status: Divorced    Spouse Name: N/A  . Number of Children: N/A  . Years of Education: N/A   Occupational History  . Retired    Social History Main Topics  . Smoking status: Former Smoker -- 1.5 years    Quit date: 01/28/1985  . Smokeless tobacco: Not on file  . Alcohol Use: No  . Drug Use: No  . Sexual Activity: Not on file   Other Topics Concern  . Not on file   Social History Narrative    Past Surgical History  Procedure Laterality Date  . Abdominal hysterectomy    . Childbirth x 2    . Eye surgery      cataract    Family History  Problem Relation Age of Onset  . Thyroid cancer Mother   . Diabetes Mother   . Hypertension Mother   . Alcohol abuse Father   . Hypertension Father     . Multiple sclerosis Daughter   . Multiple sclerosis Daughter   . Multiple sclerosis Sister     Allergies  Allergen Reactions  . Lisinopril Cough  . Metformin And Related Diarrhea  . Sulfamethoxazole     REACTION: "comatose"    Current Outpatient Prescriptions on File Prior to Visit  Medication Sig Dispense Refill  . aspirin 81 MG tablet Take 81 mg by mouth daily.    . Blood Glucose Monitoring Suppl (RELION CONFIRM GLUCOSE MONITOR) W/DEVICE KIT Use as advised 1 kit 1  . Calcium Carbonate-Vit D-Min (CALCIUM 1200) 1200-1000 MG-UNIT CHEW Chew 1 capsule by mouth daily.    Marland Kitchen glucose blood (RELION GLUCOSE TEST STRIPS) test strip Check sugars 3x a day. Dx code: E11.9 200 each 3  . ibuprofen (ADVIL,MOTRIN) 200 MG tablet Take 200 mg by mouth every 6 (six) hours as needed. Reported on 03/22/2015    . Insulin Glargine (BASAGLAR KWIKPEN) 100 UNIT/ML Solostar Pen Inject 45 Units into the skin daily at 10 pm. 45 mL 2  . insulin lispro (HUMALOG KWIKPEN) 100 UNIT/ML KiwkPen Inject 0.12-0.15 mLs (12-15 Units total) into the skin 3 (three) times daily. 15 mL 2  . losartan-hydrochlorothiazide (HYZAAR) 100-25 MG tablet Take 1  tablet by mouth daily. 30 tablet 3  . metoprolol tartrate (LOPRESSOR) 25 MG tablet Take 0.5 tablets (12.5 mg total) by mouth 2 (two) times daily. 30 tablet 3  . pyridOXINE (VITAMIN B-6) 100 MG tablet Take 100 mg by mouth daily. Reported on 03/26/2015    . RELION LANCETS MICRO-THIN 33G MISC Check sugars 3x a day 200 each 3  . RELION MINI PEN NEEDLES 31G X 6 MM MISC USE AS DIRECTED THREE TIMES DAILY 200 each 1  . Vitamin D, Cholecalciferol, 400 units CAPS Take 800 mg by mouth daily.     No current facility-administered medications on file prior to visit.    BP 160/82 mmHg  Temp(Src) 98.6 F (37 C) (Oral)  Wt 134 lb 9.6 oz (61.054 kg)       Objective:   Physical Exam  Constitutional: She is oriented to person, place, and time. She appears well-developed and well-nourished. No  distress.  Cardiovascular: Normal rate, regular rhythm, normal heart sounds and intact distal pulses.  Exam reveals no gallop and no friction rub.   No murmur heard. Pulmonary/Chest: Effort normal and breath sounds normal. No respiratory distress. She has no wheezes. She has no rales. She exhibits no tenderness.  Neurological: She is alert and oriented to person, place, and time.  Skin: Skin is warm and dry. No rash noted. She is not diaphoretic. No erythema. No pallor.  Psychiatric: She has a normal mood and affect. Her behavior is normal. Judgment and thought content normal.  Nursing note and vitals reviewed.     Assessment & Plan:  1. Essential hypertension - Increase Lopressor from 12.5 to 25 mg BID - metoprolol tartrate (LOPRESSOR) 25 MG tablet; Take 1 tablet (25 mg total) by mouth 2 (two) times daily.  Dispense: 60 tablet; Refill: 4 - Continue to monitor BP at home and send me readings in two weeks.  - Continue to work on diet and increase exercise.  - Follow up as needed  Dorothyann Peng, NP

## 2015-06-06 ENCOUNTER — Ambulatory Visit: Payer: Medicare Other | Admitting: Internal Medicine

## 2015-06-10 ENCOUNTER — Telehealth: Payer: Self-pay | Admitting: Internal Medicine

## 2015-06-10 MED ORDER — INSULIN GLARGINE 100 UNIT/ML SOLOSTAR PEN: 45.0000 [IU] | PEN_INJECTOR | Freq: Every day | SUBCUTANEOUS | Status: DC

## 2015-06-10 NOTE — Telephone Encounter (Signed)
Patient need a refill of medication Lantus,  [id number] - [address] ([address]), [place] - [address] [phone] (Phone) [phone] (Fax)

## 2015-06-10 NOTE — Telephone Encounter (Signed)
Refill sent to pharmacy electronically. °

## 2015-06-12 ENCOUNTER — Telehealth: Payer: Self-pay | Admitting: Internal Medicine

## 2015-06-12 MED ORDER — BASAGLAR KWIKPEN 100 UNIT/ML ~~LOC~~ SOPN: 45.0000 [IU] | PEN_INJECTOR | Freq: Every day | SUBCUTANEOUS | Status: DC

## 2015-06-12 NOTE — Telephone Encounter (Signed)
Returned pt's call. She said that her b/s has decreased on the Basaglar. Her b/s is between 115-142. I am sending an rx for Basaglar to pt's pharmacy to initiate a PA. Be advised.

## 2015-06-12 NOTE — Telephone Encounter (Signed)
Patient ask you to call her. °

## 2015-06-13 NOTE — Telephone Encounter (Signed)
Called pt and lvm advising her per Dr Charlean SanfilippoGherghe's message. Advised pt that we have submitted a PA for the Basaglar. Waiting on response.

## 2015-06-13 NOTE — Telephone Encounter (Signed)
Excellent news!

## 2015-07-19 ENCOUNTER — Other Ambulatory Visit: Payer: Self-pay | Admitting: Internal Medicine

## 2015-08-09 ENCOUNTER — Ambulatory Visit (INDEPENDENT_AMBULATORY_CARE_PROVIDER_SITE_OTHER): Payer: Medicare Other | Admitting: Internal Medicine

## 2015-08-09 ENCOUNTER — Encounter: Payer: Self-pay | Admitting: Internal Medicine

## 2015-08-09 VITALS — BP 168/90 | HR 72 | Temp 98.7°F | Ht 62.0 in | Wt 147.4 lb

## 2015-08-09 DIAGNOSIS — E119 Type 2 diabetes mellitus without complications: Secondary | ICD-10-CM

## 2015-08-09 DIAGNOSIS — Z794 Long term (current) use of insulin: Secondary | ICD-10-CM | POA: Diagnosis not present

## 2015-08-09 LAB — POCT GLYCOSYLATED HEMOGLOBIN (HGB A1C): Hemoglobin A1C: 7.7

## 2015-08-09 NOTE — Progress Notes (Signed)
Patient ID: Katrina Melteraulette Garrison, female   DOB: January 12, 1941, 75 y.o.   MRN: [id number]  HPI: Katrina Melteraulette Haught is a 75 y.o.-year-old female, returning for f/u for DM2, dx 2009, insulin-dependent since 2012-13, uncontrolled, without chronic complications, + hospitalization for diabetic coma x 2. Last visit 5 mo ago.  In January 2017 she started to take the insulin as Rx'ed and to start improving her diet after a family meeting with her children. Sugars are better!  Since last visit, she saw nutrition.  Latest hemoglobin A1c: Lab Results  Component Value Date   HGBA1C 9.1 03/08/2015   HGBA1C 10.1* 11/21/2014   HGBA1C 9.0 08/24/2014  Anti-islet cell and anti-GAD antibodies were undetectable.  Patient is on the following regimen (but not compliant - when compliant, HbA1c in the 7's): - Basaglar 45 units at bedtime- Humalog vials: - 12 units before a regular meal  - 15 units before a large meal or if you have dessert Stopped Metformin in 11/2013 for bloody stools. Stopped Januvia 100 mg daily >> b/c CP. Pt was in the past on a regimen of Humalog 75/25 pen - 50 units daily once a day - but was forgetting the insulin 1/7 days. She was taking the insulin at bedtime >> lows at night and highs, mostly in the 200s but also some in the 300s in am. She is doing better on Levemir.  She was also on Glipizide 10 bid in the past. She had severe diarrhea from metformin before.  Pt checks her sugars 2-4x a day - better: - am: 119-130 >> 99-136, 217, 245 >> 69x1, 121-146, few 200s >> 177-257 >> 103-300 >> 142, 182 >> 136-186 >> 115-151 - 2 hours after breakfast: 356 >> 161 and 186 >> 187 >> n/c >> 123 >> 232 >> 229-280 >> 188, 291 >> 169 >> 111-149 - before lunch: 130s >> 120-130 >> 113-142. 227, 245 >> 72-143 >> 205-224 >> 177-295 >> 153 >> 115-169 >> 120-131 - 2h after lunch: 161 >> n/c >> 121-152, 227x1 >> 151, 215-262, 328 >> 103-230, 352 >> 177 >> 121 >> 91, 114-142 - before dinner: 100s >> n/c >> 102-156  >> 98-155 >> 166-303 >> 113-164, 257 >> 153-249 >> 162 >> 114-123 - after dinner: 126 >> n/c >> 134, 163, 245x1 >> 205-384 >> 164 >> 196 >> 115-171 >> n/c - Bedtime: 124-156 >> n/c >> 112-141 >> 140-152, 217x1 >> 192-343 >> 136-160 >> 169, 183 >> 122-172 >> 188 1 low at 69 >> 151 >> 91; she has hypoglycemia awareness at 60. Highest sugar 302x1 >> 384 >> 300s > 200s >> 169 >> 188  - Pt does not have chronic kidney disease. Last BUN/Cr: Lab Results  Component Value Date   BUN 14 11/21/2014   CREATININE 0.71 11/21/2014  She was on Lisinopril >> changed to Losartan. - she does not have HL. Last Lipid panel: Lab Results  Component Value Date   CHOL 151 11/21/2014   HDL 47.50 11/21/2014   LDLCALC 79 11/21/2014   TRIG 126.0 11/21/2014   CHOLHDL 3 11/21/2014  She is not on a statin. - Pt's last eye exam was in 02/20/2015. No DR, no macular edema. Has cataracts. Had cataract Sx OU in 2015 - Denies numbness and tingling in her legs - Dr. Angelia MouldAdjoury, podiatry.   She also has a history of asthma, and is on frequent prednisone tapers, last around 02/2012; also HTN;  h/o nephrolithiasis, s/p lithotripsy; GERD.  ROS: Constitutional: no weight gain/loss, no fatigue,  no subjective hyperthermia/hypothermia Eyes: no blurry vision, no xerophthalmia ENT: + sore throat, no nodules palpated in throat, no dysphagia/odynophagia, no hoarseness Cardiovascular: no CP/SOB/palpitations/leg swelling Respiratory: + cough/no SOB Gastrointestinal: no N/V/D/C Musculoskeletal: no muscle/joint aches Skin: no rashes Neurological: no tremors/numbness/tingling/dizziness  I reviewed pt's medications, allergies, PMH, social hx, family hx, and changes were documented in the history of present illness. Otherwise, unchanged from my initial visit note.  PE: BP 168/90 mmHg  Pulse 72  Temp(Src) 98.7 F (37.1 C) (Oral)  Ht  (1.575 m)  Wt 147 lb 6.4 oz (66.86 kg)  BMI 26.95 kg/m2 Body mass index is 26.95  kg/(m^2). Body mass index is 26.95 kg/(m^2). Wt Readings from Last 3 Encounters:  08/09/15 147 lb 6.4 oz (66.86 kg)  05/15/15 134 lb 9.6 oz (61.054 kg)  03/26/15 146 lb (66.225 kg)   Constitutional: normal weight, in NAD, looking younger than stated age Eyes: PERRLA, EOMI, no exophthalmos ENT: moist mucous membranes, no thyromegaly, no cervical lymphadenopathy Cardiovascular: RRR, No MRG Respiratory: CTA B Gastrointestinal: abdomen soft, ND, BS+ Musculoskeletal: no deformities, strength intact in all 4 Skin: moist, warm, no rashes  ASSESSMENT: 1. DM2, insulin-dependent, uncontrolled, without complications She got a rash + itching from Toujeo >> now back on Lantus.   PLAN:  1. Patient with improved diabetes control after starting to follow the recommendations to take her mealtime insulin 3x a day.  - Sugars are MUCH BETTER, especially in last 2 months - will keep the insulin doses the same and encouraged her to continue the current regimen. - I advised her to:  Patient Instructions  Please continue: - Basaglar 45 units daily at bedtime - Humalog 12-15 units before meals  Please return in 3 months with your sugar log.   Your HbA1c today is 7.7%!  KEEP UP THE GOOD WORK!!!!  - UTD with eye exams - checked HbA1c today >> 7.7% (better) - I will see the patient back in 3 mo with her sugar log

## 2015-08-09 NOTE — Progress Notes (Signed)
Pre visit review using our clinic review tool, if applicable. No additional management support is needed unless otherwise documented below in the visit note. 

## 2015-08-09 NOTE — Addendum Note (Signed)
Addended by: Verdie ShireBAYNES, ANGELA M on: 08/09/2015 02:39 PM   Modules accepted: Orders

## 2015-08-09 NOTE — Patient Instructions (Signed)
Please continue: - Basaglar 45 units daily at bedtime - Humalog 12-15 units before meals  Please return in 3 months with your sugar log.   Your HbA1c today is 7.7%!  KEEP UP THE GOOD WORK!!!!

## 2015-08-28 ENCOUNTER — Ambulatory Visit (INDEPENDENT_AMBULATORY_CARE_PROVIDER_SITE_OTHER): Payer: Medicare Other | Admitting: Adult Health

## 2015-08-28 ENCOUNTER — Encounter: Payer: Self-pay | Admitting: Adult Health

## 2015-08-28 VITALS — BP 152/74 | Temp 98.6°F | Ht 62.0 in | Wt 148.3 lb

## 2015-08-28 DIAGNOSIS — I10 Essential (primary) hypertension: Secondary | ICD-10-CM

## 2015-08-28 NOTE — Patient Instructions (Signed)
It was great seeing you again  Your blood pressure is improving.   Restart the Metoprolol   Follow up with me this fall for your physical   I am very proud of you and the hard work you are doing!

## 2015-08-28 NOTE — Progress Notes (Signed)
Subjective:    Patient ID: Katrina Garrison, female    DOB: Oct 06, 1940, 75 y.o.   MRN: [id number]  HPI  Katrina Garrison is a 75 year old female who presents to the office today for follow up regarding Hypertension. He current regimen includes Lopressor 25 mg BID, and Hyzaar 100-25. Her blood pressures in the past have been uncontrolled.   Today in the office she reports that her A1c was down to 7.7 when she last saw Dr. Cruzita Lederer in July 2017. She reports that she is eating healthy and has started an exercise regimen.  When it comes to blood pressure control, she has not been taking Metoprolol.   Over all she feels " really good" and having a lower A1c has given her motivation to continue with diet and exercise  BP Readings from Last 3 Encounters:  08/28/15 (!) 152/74  08/09/15 (!) 168/90  05/15/15 (!) 160/82    Review of Systems  Constitutional: Negative.   HENT: Negative.   Respiratory: Negative.   Cardiovascular: Negative.   Genitourinary: Negative.   Neurological: Negative.   All other systems reviewed and are negative.  Past Medical History:  Diagnosis Date  . Anemia   . Diabetes mellitus type II 2005  . Diverticulosis   . GERD (gastroesophageal reflux disease)   . History of lithotripsy    x3 for kidney stones  . Hypertension     Social History   Social History  . Marital status: Divorced    Spouse name: N/A  . Number of children: N/A  . Years of education: N/A   Occupational History  . Retired    Social History Main Topics  . Smoking status: Former Smoker    Years: 1.50    Quit date: 01/28/1985  . Smokeless tobacco: Not on file  . Alcohol use No  . Drug use: No  . Sexual activity: Not on file   Other Topics Concern  . Not on file   Social History Narrative  . No narrative on file    Past Surgical History:  Procedure Laterality Date  . ABDOMINAL HYSTERECTOMY    . childbirth x 2    . EYE SURGERY     cataract    Family History  Problem  Relation Age of Onset  . Thyroid cancer Mother   . Diabetes Mother   . Hypertension Mother   . Alcohol abuse Father   . Hypertension Father   . Multiple sclerosis Daughter   . Multiple sclerosis Daughter   . Multiple sclerosis Sister     Allergies  Allergen Reactions  . Lisinopril Cough  . Metformin And Related Diarrhea  . Sulfamethoxazole     REACTION: "comatose"    Current Outpatient Prescriptions on File Prior to Visit  Medication Sig Dispense Refill  . aspirin 81 MG tablet Take 81 mg by mouth daily.    . Blood Glucose Monitoring Suppl (RELION CONFIRM GLUCOSE MONITOR) W/DEVICE KIT Use as advised 1 kit 1  . Calcium Carbonate-Vit D-Min (CALCIUM 1200) 1200-1000 MG-UNIT CHEW Chew 1 capsule by mouth daily.    Marland Kitchen glucose blood (RELION GLUCOSE TEST STRIPS) test strip Check sugars 3x a day. Dx code: E11.9 200 each 3  . HUMALOG KWIKPEN 100 UNIT/ML KiwkPen INJECT 12 TO 15 UNITS SUBCUTANEOUSLY THREE TIMES DAILY 15 mL 2  . ibuprofen (ADVIL,MOTRIN) 200 MG tablet Take 200 mg by mouth every 6 (six) hours as needed. Reported on 03/22/2015    . Insulin Glargine (BASAGLAR KWIKPEN) 100 UNIT/ML  SOPN Inject 0.45 mLs (45 Units total) into the skin at bedtime. 15 mL 2  . losartan-hydrochlorothiazide (HYZAAR) 100-25 MG tablet Take 1 tablet by mouth daily. 30 tablet 3  . metoprolol tartrate (LOPRESSOR) 25 MG tablet Take 1 tablet (25 mg total) by mouth 2 (two) times daily. 60 tablet 4  . RELION LANCETS MICRO-THIN 33G MISC Check sugars 3x a day 200 each 3  . RELION MINI PEN NEEDLES 31G X 6 MM MISC USE AS DIRECTED THREE TIMES DAILY 200 each 1  . Vitamin D, Cholecalciferol, 400 units CAPS Take 800 mg by mouth daily.    . pyridOXINE (VITAMIN B-6) 100 MG tablet Take 100 mg by mouth daily. Reported on 08/09/2015     No current facility-administered medications on file prior to visit.     BP (!) 152/74   Temp 98.6 F (37 C) (Oral)   Ht 5' 2" (1.575 m)   Wt 148 lb 4.8 oz (67.3 kg)   BMI 27.12 kg/m         Objective:   Physical Exam  Constitutional: She is oriented to person, place, and time. She appears well-developed and well-nourished.  Cardiovascular: Normal rate, regular rhythm, normal heart sounds and intact distal pulses.  Exam reveals no gallop and no friction rub.   No murmur heard. Pulmonary/Chest: Effort normal and breath sounds normal. No respiratory distress. She has no wheezes. She has no rales. She exhibits no tenderness.  Neurological: She is alert and oriented to person, place, and time.  Skin: Skin is warm and dry. No rash noted. She is not diaphoretic. No erythema. No pallor.  Nursing note and vitals reviewed.     Assessment & Plan:  1. Essential hypertension - Slightly improved today - likely from diet and exercise - Start taking Metoprolol  - Will monitor at physical in November - Follow up as needed - Continue with healthy life style.    , NP   

## 2015-09-18 ENCOUNTER — Other Ambulatory Visit: Payer: Self-pay | Admitting: Adult Health

## 2015-09-18 DIAGNOSIS — I10 Essential (primary) hypertension: Secondary | ICD-10-CM

## 2015-11-04 ENCOUNTER — Telehealth: Payer: Self-pay | Admitting: Adult Health

## 2015-11-04 ENCOUNTER — Other Ambulatory Visit: Payer: Self-pay | Admitting: Adult Health

## 2015-11-04 DIAGNOSIS — Z1231 Encounter for screening mammogram for malignant neoplasm of breast: Secondary | ICD-10-CM

## 2015-11-04 DIAGNOSIS — N644 Mastodynia: Secondary | ICD-10-CM

## 2015-11-04 NOTE — Telephone Encounter (Signed)
[facility]  [phone] due to her having pain in her R breast around the nipple and the Breast center stated that she need a referral.  The following is her availablity   Time she is available is 9-11      Dates Oct 26,27 Nov 3,6 and 7   Pt would like to speak with Kandee KeenCory to confirm that he is able to do this referral.

## 2015-11-05 ENCOUNTER — Other Ambulatory Visit: Payer: Self-pay | Admitting: Adult Health

## 2015-11-05 DIAGNOSIS — N644 Mastodynia: Secondary | ICD-10-CM

## 2015-11-05 NOTE — Telephone Encounter (Signed)
Patient notified and verbalized understanding. 

## 2015-11-05 NOTE — Telephone Encounter (Signed)
Please let Katrina Garrison know that I have placed the referral

## 2015-11-08 ENCOUNTER — Other Ambulatory Visit: Payer: Self-pay | Admitting: Internal Medicine

## 2015-11-08 NOTE — Addendum Note (Signed)
Addended by: Marcell AngerSELF, Tayden Duran E on: 11/08/2015 02:37 PM   Modules accepted: Orders

## 2015-11-08 NOTE — Telephone Encounter (Signed)
New orders placed. Left voicemail letting patient know and to call back with any questions.

## 2015-11-08 NOTE — Telephone Encounter (Signed)
Pt has called and state that [facility] received the wrong order it should be for Diagnostic Mammogram due to her pain.  Pt would like to have a call back letting her know that it has been correctly.

## 2015-11-13 ENCOUNTER — Ambulatory Visit
Admission: RE | Admit: 2015-11-13 | Discharge: 2015-11-13 | Disposition: A | Discharge status: Home / Self Care | Payer: Medicare Other | Source: Ambulatory Visit | Attending: Adult Health | Admitting: Adult Health

## 2015-11-13 ENCOUNTER — Ambulatory Visit: Payer: Medicare Other | Admitting: Internal Medicine

## 2015-11-13 DIAGNOSIS — N644 Mastodynia: Secondary | ICD-10-CM

## 2015-11-25 ENCOUNTER — Other Ambulatory Visit: Payer: Self-pay

## 2015-11-25 DIAGNOSIS — I10 Essential (primary) hypertension: Secondary | ICD-10-CM

## 2015-11-25 MED ORDER — LOSARTAN POTASSIUM-HCTZ 100-25 MG PO TABS: 1.0000 | ORAL_TABLET | Freq: Every day | ORAL | 1 refills | Status: DC

## 2015-11-28 ENCOUNTER — Other Ambulatory Visit: Payer: Self-pay | Admitting: Adult Health

## 2015-11-28 DIAGNOSIS — Z1231 Encounter for screening mammogram for malignant neoplasm of breast: Secondary | ICD-10-CM

## 2015-12-04 ENCOUNTER — Encounter: Payer: Self-pay | Admitting: Adult Health

## 2015-12-04 ENCOUNTER — Other Ambulatory Visit: Payer: Self-pay | Admitting: Adult Health

## 2015-12-04 ENCOUNTER — Ambulatory Visit (INDEPENDENT_AMBULATORY_CARE_PROVIDER_SITE_OTHER): Payer: Medicare Other | Admitting: Adult Health

## 2015-12-04 VITALS — BP 164/80 | Temp 98.8°F | Ht 62.0 in | Wt 150.3 lb

## 2015-12-04 DIAGNOSIS — Z23 Encounter for immunization: Secondary | ICD-10-CM

## 2015-12-04 DIAGNOSIS — B370 Candidal stomatitis: Secondary | ICD-10-CM

## 2015-12-04 DIAGNOSIS — E119 Type 2 diabetes mellitus without complications: Secondary | ICD-10-CM

## 2015-12-04 DIAGNOSIS — E559 Vitamin D deficiency, unspecified: Secondary | ICD-10-CM | POA: Diagnosis not present

## 2015-12-04 DIAGNOSIS — Z0000 Encounter for general adult medical examination without abnormal findings: Secondary | ICD-10-CM | POA: Diagnosis not present

## 2015-12-04 DIAGNOSIS — I10 Essential (primary) hypertension: Secondary | ICD-10-CM

## 2015-12-04 DIAGNOSIS — E785 Hyperlipidemia, unspecified: Secondary | ICD-10-CM | POA: Diagnosis not present

## 2015-12-04 DIAGNOSIS — Z794 Long term (current) use of insulin: Secondary | ICD-10-CM

## 2015-12-04 LAB — POC URINALSYSI DIPSTICK (AUTOMATED)
Bilirubin, UA: NEGATIVE
Blood, UA: NEGATIVE
Glucose, UA: NEGATIVE
Leukocytes, UA: NEGATIVE
Nitrite, UA: NEGATIVE
PH UA: 5
SPEC GRAV UA: 1.02
Urobilinogen, UA: 0.2

## 2015-12-04 LAB — LIPID PANEL
Cholesterol: 128 mg/dL (ref 0–200)
HDL: 41.8 mg/dL (ref >39.00)
LDL CALC: 64 mg/dL (ref 0–99)
NONHDL: 86.14
Total CHOL/HDL Ratio: 3
Triglycerides: 113 mg/dL (ref 0.0–149.0)
VLDL: 22.6 mg/dL (ref 0.0–40.0)

## 2015-12-04 LAB — BASIC METABOLIC PANEL
BUN: 12 mg/dL (ref 6–23)
CHLORIDE: 105 meq/L (ref 96–112)
CO2: 28 mEq/L (ref 19–32)
Calcium: 9.8 mg/dL (ref 8.4–10.5)
Creatinine, Ser: 0.77 mg/dL (ref 0.40–1.20)
GFR: 94.05 mL/min (ref >60.00)
GLUCOSE: 179 mg/dL — AB (ref 70–99)
POTASSIUM: 4.7 meq/L (ref 3.5–5.1)
SODIUM: 140 meq/L (ref 135–145)

## 2015-12-04 LAB — HEPATIC FUNCTION PANEL
ALBUMIN: 4.3 g/dL (ref 3.5–5.2)
ALK PHOS: 73 U/L (ref 39–117)
ALT: 13 U/L (ref 0–35)
AST: 15 U/L (ref 0–37)
BILIRUBIN TOTAL: 0.6 mg/dL (ref 0.2–1.2)
Bilirubin, Direct: 0.1 mg/dL (ref 0.0–0.3)
Total Protein: 7.7 g/dL (ref 6.0–8.3)

## 2015-12-04 LAB — VITAMIN D 25 HYDROXY (VIT D DEFICIENCY, FRACTURES): VITD: 22.52 ng/mL — ABNORMAL LOW (ref 30.00–100.00)

## 2015-12-04 LAB — TSH: TSH: 1.37 u[IU]/mL (ref 0.35–4.50)

## 2015-12-04 MED ORDER — VITAMIN D3 75 MCG (3000 UT) PO TABS: 1.0000 | ORAL_TABLET | Freq: Every day | ORAL | 3 refills | Status: DC

## 2015-12-04 MED ORDER — NYSTATIN 100000 UNIT/ML MT SUSP: 5.0000 mL | Freq: Four times a day (QID) | OROMUCOSAL | 0 refills | Status: DC

## 2015-12-04 NOTE — Addendum Note (Signed)
Addended by: Janelle FloorHOMPSON, Azora Bonzo B on: 12/04/2015 02:06 PM   Modules accepted: Orders

## 2015-12-04 NOTE — Progress Notes (Signed)
Subjective:    Patient ID: Katrina Garrison, female    DOB: 05-14-1940, 75 y.o.   MRN: [id number]  HPI  Patient presents for yearly preventative medicine examination. She is a 75 year old female who  has a past medical history of Anemia; Diabetes mellitus type II (2005); Diverticulosis; GERD (gastroesophageal reflux disease); History of lithotripsy; and Hypertension.  All immunizations and health maintenance protocols were reviewed with the patient and needed orders were placed.  Appropriate screening laboratory values were ordered for the patient including screening of hyperlipidemia, renal function and hepatic function.  Medication reconciliation,  past medical history, social history, problem list and allergies were reviewed in detail with the patient  Goals were established with regard to weight loss, exercise, and  diet in compliance with medications She reports doing an exercise class twice a week at the local gym she also reports that she is eating healthy.   Wt Readings from Last 3 Encounters:  12/04/15 150 lb 4.8 oz (68.2 kg)  08/28/15 148 lb 4.8 oz (67.3 kg)  08/09/15 147 lb 6.4 oz (66.9 kg)     Her blood sugars have been in the 130's per patient.   End of life planning was discussed. She has an advanced directive and living will.   She is up to date on her colonoscopy and has her mammogram scheduled. She is past due on her endocrinology visit.   She reports that she has not been taking her Losartan/HCTZ due to feeling as though her tongue was swelling. She has not taken this medication in weeks and she continues to feel as though her tongue is swelling.     Review of Systems  Constitutional: Negative.   HENT: Positive for facial swelling.   Eyes: Negative.   Respiratory: Negative.   Cardiovascular: Negative.   Gastrointestinal: Negative.   Endocrine: Negative.   Genitourinary: Negative.   Musculoskeletal: Negative.   Skin: Negative.   Allergic/Immunologic:  Negative.   Neurological: Positive for numbness (left foot).  Hematological: Negative.   Psychiatric/Behavioral: Negative.   All other systems reviewed and are negative.    Past Medical History:  Diagnosis Date  . Anemia   . Diabetes mellitus type II 2005  . Diverticulosis   . GERD (gastroesophageal reflux disease)   . History of lithotripsy    x3 for kidney stones  . Hypertension     Social History   Social History  . Marital status: Divorced    Spouse name: N/A  . Number of children: N/A  . Years of education: N/A   Occupational History  . Retired    Social History Main Topics  . Smoking status: Former Smoker    Years: 1.50    Quit date: 01/28/1985  . Smokeless tobacco: Not on file  . Alcohol use No  . Drug use: No  . Sexual activity: Not on file   Other Topics Concern  . Not on file   Social History Narrative  . No narrative on file    Past Surgical History:  Procedure Laterality Date  . ABDOMINAL HYSTERECTOMY    . childbirth x 2    . EYE SURGERY     cataract    Family History  Problem Relation Age of Onset  . Thyroid cancer Mother   . Diabetes Mother   . Hypertension Mother   . Alcohol abuse Father   . Hypertension Father   . Multiple sclerosis Daughter   . Multiple sclerosis Daughter   . Multiple sclerosis  Sister     Allergies  Allergen Reactions  . Lisinopril Cough  . Metformin And Related Diarrhea  . Sulfamethoxazole     REACTION: "comatose"    Current Outpatient Prescriptions on File Prior to Visit  Medication Sig Dispense Refill  . aspirin 81 MG tablet Take 81 mg by mouth daily.    . Blood Glucose Monitoring Suppl (RELION CONFIRM GLUCOSE MONITOR) W/DEVICE KIT Use as advised 1 kit 1  . Calcium Carbonate-Vit D-Min (CALCIUM 1200) 1200-1000 MG-UNIT CHEW Chew 1 capsule by mouth daily.    Marland Kitchen glucose blood (RELION GLUCOSE TEST STRIPS) test strip Check sugars 3x a day. Dx code: E11.9 200 each 3  . HUMALOG KWIKPEN 100 UNIT/ML KiwkPen INJECT  12 TO 15 UNITS SUBCUTANEOUSLY THREE TIMES DAILY 15 mL 2  . ibuprofen (ADVIL,MOTRIN) 200 MG tablet Take 200 mg by mouth every 6 (six) hours as needed. Reported on 03/22/2015    . Insulin Glargine (BASAGLAR KWIKPEN) 100 UNIT/ML SOPN INJECT 45 UNITS SUBCUTANEOUSLY AT BEDTIME 15 mL 2  . losartan-hydrochlorothiazide (HYZAAR) 100-25 MG tablet Take 1 tablet by mouth daily. 90 tablet 1  . metoprolol tartrate (LOPRESSOR) 25 MG tablet Take 1 tablet (25 mg total) by mouth 2 (two) times daily. 60 tablet 4  . pyridOXINE (VITAMIN B-6) 100 MG tablet Take 100 mg by mouth daily. Reported on 08/09/2015    . RELION LANCETS MICRO-THIN 33G MISC Check sugars 3x a day 200 each 3  . RELION PEN NEEDLES 31G X 6 MM MISC USE AS DIRECTED THREE TIMES DAILY 100 each 3  . Vitamin D, Cholecalciferol, 400 units CAPS Take 800 mg by mouth daily.     No current facility-administered medications on file prior to visit.     BP (!) 164/80   Temp 98.8 F (37.1 C) (Oral)   Ht '5\' 2"'  (1.575 m)   Wt 150 lb 4.8 oz (68.2 kg)   BMI 27.49 kg/m       Objective:   Physical Exam  Constitutional: She is oriented to person, place, and time. She appears well-developed and well-nourished. No distress.  HENT:  Head: Normocephalic and atraumatic.  Right Ear: External ear normal.  Left Ear: External ear normal.  Nose: Nose normal.  Mouth/Throat: Oropharynx is clear and moist. No oropharyngeal exudate.  + thrush  - No signs of angioedema   Eyes: Conjunctivae are normal. Pupils are equal, round, and reactive to light. Right eye exhibits no discharge. Left eye exhibits no discharge. No scleral icterus.  Neck: Normal range of motion. Neck supple. No JVD present. No tracheal deviation present. No thyromegaly present.  Cardiovascular: Normal rate, regular rhythm, normal heart sounds and intact distal pulses.  Exam reveals no gallop and no friction rub.   No murmur heard. Pulmonary/Chest: Effort normal and breath sounds normal. No stridor. No  respiratory distress. She has no wheezes. She has no rales. She exhibits no tenderness.  Abdominal: Soft. Bowel sounds are normal. She exhibits no distension and no mass. There is no tenderness. There is no rebound and no guarding.  Genitourinary:  Genitourinary Comments: Deferred  Musculoskeletal: Normal range of motion.  Lymphadenopathy:    She has no cervical adenopathy.  Neurological: She is alert and oriented to person, place, and time. She has normal reflexes. She displays normal reflexes. No cranial nerve deficit. She exhibits normal muscle tone. Coordination normal.  Skin: Skin is warm and dry. No rash noted. No erythema. No pallor.  Psychiatric: She has a normal mood and affect. Her  behavior is normal. Judgment and thought content normal.  Nursing note and vitals reviewed.     Assessment & Plan:  1. Routine general medical examination at a health care facility - Continue to exercise and eat healthy  - Follow up with endocrinology  - Follow up in one year or sooner if needed - Basic metabolic panel - Hepatic function panel - Lipid panel - POCT Urinalysis Dipstick (Automated) - TSH  2. Essential hypertension  - Basic metabolic panel - Hepatic function panel - Lipid panel - POCT Urinalysis Dipstick (Automated) - TSH - EKG 12-Lead - Sinus  Rhythm  -consider old anterior infarct. Rate 78 - Not well controlled. Asked to restart Losartan/HcTZ as this is unlikely to cause you to feel as though your tongue is swelling -  3. Type 2 diabetes mellitus without complication, with long-term current use of insulin (HCC) - Basic metabolic panel - Hepatic function panel - Lipid panel - POCT Urinalysis Dipstick (Automated) - TSH - Follow up with Dr. Cruzita Lederer Samples of Lauralyn Primes was given to patient.   4. Hyperlipidemia, unspecified hyperlipidemia type - Consider adding statin  - Basic metabolic panel - Hepatic function panel - Lipid panel - POCT Urinalysis Dipstick  (Automated) - TSH  5. Vitamin D deficiency - Vitamin D, 25-hydroxy - Consider increasing Vitamin D 6. Thrush - nystatin (MYCOSTATIN) 100000 UNIT/ML suspension; Take 5 mLs (500,000 Units total) by mouth 4 (four) times daily.  Dispense: 60 mL; Refill: 0  Dorothyann Peng, NP

## 2015-12-04 NOTE — Patient Instructions (Signed)
It was great seeing you today.   I have sent in your blood pressure medications. Take as directed.   Follow up with Dr. Elvera LennoxGherghe.   Follow up with me 6 months to see how you are doing   Continue to exercise and eat healthy

## 2015-12-05 ENCOUNTER — Other Ambulatory Visit: Payer: Self-pay

## 2015-12-05 MED ORDER — VITAMIN D3 50 MCG (2000 UT) PO TABS: 2000.0000 [IU] | ORAL_TABLET | Freq: Every day | ORAL | 3 refills | Status: DC

## 2015-12-08 ENCOUNTER — Other Ambulatory Visit: Payer: Self-pay | Admitting: Adult Health

## 2015-12-11 ENCOUNTER — Ambulatory Visit (INDEPENDENT_AMBULATORY_CARE_PROVIDER_SITE_OTHER)
Admission: RE | Admit: 2015-12-11 | Discharge: 2015-12-11 | Disposition: A | Discharge status: Home / Self Care | Payer: Medicare Other | Source: Ambulatory Visit | Attending: Adult Health | Admitting: Adult Health

## 2015-12-11 ENCOUNTER — Telehealth: Payer: Self-pay | Admitting: Adult Health

## 2015-12-11 ENCOUNTER — Ambulatory Visit (INDEPENDENT_AMBULATORY_CARE_PROVIDER_SITE_OTHER): Payer: Medicare Other | Admitting: Adult Health

## 2015-12-11 ENCOUNTER — Encounter: Payer: Self-pay | Admitting: Adult Health

## 2015-12-11 VITALS — BP 160/90 | Ht 62.0 in | Wt 149.8 lb

## 2015-12-11 DIAGNOSIS — R1031 Right lower quadrant pain: Secondary | ICD-10-CM

## 2015-12-11 DIAGNOSIS — M545 Low back pain: Secondary | ICD-10-CM

## 2015-12-11 LAB — CBC WITH DIFFERENTIAL/PLATELET
BASOS ABS: 0 10*3/uL (ref 0.0–0.1)
BASOS PCT: 0.2 % (ref 0.0–3.0)
EOS ABS: 0.1 10*3/uL (ref 0.0–0.7)
Eosinophils Relative: 1.5 % (ref 0.0–5.0)
HCT: 38.4 % (ref 36.0–46.0)
HEMOGLOBIN: 12.6 g/dL (ref 12.0–15.0)
Lymphocytes Relative: 23.9 % (ref 12.0–46.0)
Lymphs Abs: 1.7 10*3/uL (ref 0.7–4.0)
MCHC: 32.7 g/dL (ref 30.0–36.0)
MCV: 83.6 fl (ref 78.0–100.0)
MONO ABS: 0.4 10*3/uL (ref 0.1–1.0)
Monocytes Relative: 5 % (ref 3.0–12.0)
Neutro Abs: 5 10*3/uL (ref 1.4–7.7)
Neutrophils Relative %: 69.4 % (ref 43.0–77.0)
PLATELETS: 250 10*3/uL (ref 150.0–400.0)
RBC: 4.59 Mil/uL (ref 3.87–5.11)
RDW: 14.6 % (ref 11.5–15.5)
WBC: 7.3 10*3/uL (ref 4.0–10.5)

## 2015-12-11 LAB — POCT URINALYSIS DIPSTICK
Bilirubin, UA: NEGATIVE
GLUCOSE UA: NEGATIVE
Ketones, UA: NEGATIVE
Leukocytes, UA: NEGATIVE
NITRITE UA: NEGATIVE
PH UA: 5.5
RBC UA: NEGATIVE
SPEC GRAV UA: 1.025
UROBILINOGEN UA: NEGATIVE

## 2015-12-11 LAB — HEPATIC FUNCTION PANEL
ALT: 16 U/L (ref 0–35)
AST: 15 U/L (ref 0–37)
Albumin: 4 g/dL (ref 3.5–5.2)
Alkaline Phosphatase: 79 U/L (ref 39–117)
BILIRUBIN DIRECT: 0.1 mg/dL (ref 0.0–0.3)
BILIRUBIN TOTAL: 0.5 mg/dL (ref 0.2–1.2)
Total Protein: 7.2 g/dL (ref 6.0–8.3)

## 2015-12-11 LAB — BASIC METABOLIC PANEL
BUN: 14 mg/dL (ref 6–23)
CO2: 28 meq/L (ref 19–32)
Calcium: 9.6 mg/dL (ref 8.4–10.5)
Chloride: 104 mEq/L (ref 96–112)
Creatinine, Ser: 0.79 mg/dL (ref 0.40–1.20)
GFR: 91.31 mL/min (ref >60.00)
GLUCOSE: 272 mg/dL — AB (ref 70–99)
POTASSIUM: 4.3 meq/L (ref 3.5–5.1)
SODIUM: 139 meq/L (ref 135–145)

## 2015-12-11 LAB — LIPASE: LIPASE: 20 U/L (ref 11.0–59.0)

## 2015-12-11 MED ORDER — TRAMADOL HCL 50 MG PO TABS: 50.0000 mg | ORAL_TABLET | Freq: Three times a day (TID) | ORAL | 0 refills | Status: DC | PRN

## 2015-12-11 MED ORDER — KETOROLAC TROMETHAMINE 60 MG/2ML IM SOLN
60.0000 mg | Freq: Once | INTRAMUSCULAR | Status: AC
  Administered 2015-12-11: 60 mg via INTRAMUSCULAR

## 2015-12-11 MED ORDER — TERAZOSIN HCL 1 MG PO CAPS: 2.0000 mg | ORAL_CAPSULE | Freq: Every day | ORAL | Status: DC

## 2015-12-11 MED ORDER — IOPAMIDOL (ISOVUE-300) INJECTION 61%
100.0000 mL | Freq: Once | INTRAVENOUS | Status: AC | PRN
  Administered 2015-12-11: 100 mL via INTRAVENOUS

## 2015-12-11 NOTE — Telephone Encounter (Signed)
[facility] - Client TELEPHONE ADVICE RECORD [facility] Patient Name: Katrina Garrison DOB: 1940/09/04 Initial Comment Caller states since Sunday right abdomin pain. She had kidney stone surgery and pain, which she thinks this might be. She thinks there might be a pill to take for this. Nurse Assessment Nurse: Lane HackerHarley, RN, Elvin SoWindy Date/Time Lamount Cohen(Eastern Time): 12/11/2015 8:41:32 AM Confirm and document reason for call. If symptomatic, describe symptoms. You must click the next button to save text entered. ---Caller states c/o RLQ abdominal pain constant since Sunday. Taking Advil. Rates now 7/10. - She had kidney stone pain requiring surgery, and h/o divertulitis, but this reminds her of the kidney stone pain. Has the patient traveled out of the country within the last 30 days? ---Not Applicable Does the patient have any new or worsening symptoms? ---Yes Will a triage be completed? ---Yes Related visit to physician within the last 2 weeks? ---No Does the PT have any chronic conditions? (i.e. diabetes, asthma, etc.) ---Yes List chronic conditions. ---kidney stones, diverticulitis, DM Is this a behavioral health or substance abuse call? ---No Guidelines Guideline Title Affirmed Question Affirmed Notes Abdominal Pain - Female [1] MILD-MODERATE pain AND [2] constant AND [3] present > 2 hours Final Disposition User See Physician within 4 Hours (or PCP triage) Lane HackerHarley, RN, Windy Referrals REFERRED TO PCP OFFICE Disagree/Comply: Comply

## 2015-12-11 NOTE — Telephone Encounter (Signed)
[facility]

## 2015-12-11 NOTE — Progress Notes (Signed)
Subjective:    Patient ID: Katrina Garrison, female    DOB: [birth year], 75 y.o.   MRN: [id number]  HPI  75 year old female who  has a past medical history of Anemia; Diabetes mellitus type II (2005); Diverticulosis; GERD (gastroesophageal reflux disease); History of lithotripsy; and Hypertension. She presents to the office today for right lower quadrant pain that started 3 days ago. The pain is described as " dull aching". She has had increasing discomfort as the week progressed.   She has a history of kidney stones and felt as though this was the same type of pain   Denies fevers, n/v/d.   Movement and touch make the pain worse.   She has been taking motrin for pain relief.    Review of Systems  Constitutional: Positive for activity change.  Respiratory: Negative.   Cardiovascular: Negative.   Gastrointestinal: Positive for abdominal pain. Negative for blood in stool, constipation, diarrhea, nausea, rectal pain and vomiting.  Genitourinary: Negative.   Musculoskeletal: Negative.   Skin: Negative.   Neurological: Negative.   Hematological: Negative.   All other systems reviewed and are negative.  Past Medical History:  Diagnosis Date  . Anemia   . Diabetes mellitus type II 2005  . Diverticulosis   . GERD (gastroesophageal reflux disease)   . History of lithotripsy    x3 for kidney stones  . Hypertension   . Kidney stones     Social History   Social History  . Marital status: Divorced    Spouse name: N/A  . Number of children: N/A  . Years of education: N/A   Occupational History  . Retired    Social History Main Topics  . Smoking status: Former Smoker    Years: 1.50    Quit date: 01/28/1985  . Smokeless tobacco: Not on file  . Alcohol use No  . Drug use: No  . Sexual activity: Not on file   Other Topics Concern  . Not on file   Social History Narrative  . No narrative on file    Past Surgical History:  Procedure Laterality Date  . ABDOMINAL  HYSTERECTOMY    . childbirth x 2    . EYE SURGERY     cataract    Family History  Problem Relation Age of Onset  . Thyroid cancer Mother   . Diabetes Mother   . Hypertension Mother   . Alcohol abuse Father   . Hypertension Father   . Multiple sclerosis Daughter   . Multiple sclerosis Daughter   . Multiple sclerosis Sister     Allergies  Allergen Reactions  . Lisinopril Cough  . Metformin And Related Diarrhea  . Sulfamethoxazole     REACTION: "comatose"    Current Outpatient Prescriptions on File Prior to Visit  Medication Sig Dispense Refill  . aspirin 81 MG tablet Take 81 mg by mouth daily.    . Blood Glucose Monitoring Suppl (RELION CONFIRM GLUCOSE MONITOR) W/DEVICE KIT Use as advised 1 kit 1  . Calcium Carbonate-Vit D-Min (CALCIUM 1200) 1200-1000 MG-UNIT CHEW Chew 1 capsule by mouth daily.    . Cholecalciferol (VITAMIN D3) 2000 units TABS Take 2,000 Units by mouth daily. 90 tablet 3  . Cholecalciferol (VITAMIN D3) 3000 units TABS Take 1 tablet by mouth daily. 90 tablet 3  . glucose blood (RELION GLUCOSE TEST STRIPS) test strip Check sugars 3x a day. Dx code: E11.9 200 each 3  . HUMALOG KWIKPEN 100 UNIT/ML KiwkPen INJECT 12 TO 15  UNITS SUBCUTANEOUSLY THREE TIMES DAILY 15 mL 2  . ibuprofen (ADVIL,MOTRIN) 200 MG tablet Take 200 mg by mouth every 6 (six) hours as needed. Reported on 03/22/2015    . Insulin Glargine (BASAGLAR KWIKPEN) 100 UNIT/ML SOPN INJECT 45 UNITS SUBCUTANEOUSLY AT BEDTIME 15 mL 2  . losartan-hydrochlorothiazide (HYZAAR) 100-25 MG tablet Take 1 tablet by mouth daily. 90 tablet 1  . metoprolol tartrate (LOPRESSOR) 25 MG tablet Take 1 tablet (25 mg total) by mouth 2 (two) times daily. 60 tablet 4  . metoprolol tartrate (LOPRESSOR) 25 MG tablet TAKE ONE-HALF TABLET BY MOUTH TWICE DAILY 30 tablet 3  . nystatin (MYCOSTATIN) 100000 UNIT/ML suspension Take 5 mLs (500,000 Units total) by mouth 4 (four) times daily. 60 mL 0  . pyridOXINE (VITAMIN B-6) 100 MG tablet  Take 100 mg by mouth daily. Reported on 08/09/2015    . RELION LANCETS MICRO-THIN 33G MISC Check sugars 3x a day 200 each 3  . RELION PEN NEEDLES 31G X 6 MM MISC USE AS DIRECTED THREE TIMES DAILY 100 each 3   No current facility-administered medications on file prior to visit.     BP (!) 160/90   Ht _0  (1.575 m)   Wt 149 lb 12.8 oz (67.9 kg)   BMI 27.40 kg/m        Objective:   Physical Exam  Constitutional: She is oriented to person, place, and time. She appears well-developed and well-nourished. No distress.  Cardiovascular: Normal rate, regular rhythm, normal heart sounds and intact distal pulses.  Exam reveals no gallop and no friction rub.   No murmur heard. Pulmonary/Chest: Effort normal and breath sounds normal. No respiratory distress. She has no wheezes. She has no rales. She exhibits no tenderness.  Abdominal: Soft. Normal appearance and bowel sounds are normal. There is no hepatosplenomegaly, splenomegaly or hepatomegaly. There is tenderness in the right lower quadrant. There is CVA tenderness (right ) and tenderness at McBurney's point. There is no rigidity, no rebound and no guarding. No hernia.  Pain in left lower abdomen with deep palpation and with straight leg raise and knee to chest movement   Musculoskeletal: Normal range of motion.  Neurological: She is alert and oriented to person, place, and time. She has normal reflexes.  Skin: Skin is warm and dry. No rash noted. She is not diaphoretic. No erythema. No pallor.  Psychiatric: She has a normal mood and affect. Her behavior is normal. Judgment and thought content normal.  Nursing note and vitals reviewed.      Assessment & Plan:  1. Right lower quadrant pain - Doubt appendicitis but cannot rule it out at this time. Possibly kidney stones or IBS. She does report normal bowel movements.  - POC Urinalysis Dipstick- Negative  - CT Abdomen Pelvis W Contrast; Future - CBC with Differential/Platelet - Lipase -  Hepatic function panel - ketorolac (TORADOL) injection 60 mg; Inject 2 mLs (60 mg total) into the muscle once. - Basic metabolic panel - Will follow up once CT has resulted and form plan of care at that time.   Dorothyann Peng, NP

## 2015-12-11 NOTE — Addendum Note (Signed)
Addended by: Nancy FetterNAFZIGER, Jesyca Weisenburger L on: 12/11/2015 05:25 PM   Modules accepted: Orders

## 2015-12-12 ENCOUNTER — Telehealth: Payer: Self-pay | Admitting: Adult Health

## 2015-12-12 MED ORDER — TERAZOSIN HCL 2 MG PO CAPS: 2.0000 mg | ORAL_CAPSULE | Freq: Every day | ORAL | 0 refills | Status: DC

## 2015-12-12 NOTE — Telephone Encounter (Signed)
Updated on the results of her CAT scan. She does not have appendicitis. Results show:  Hepatic steatosis without space-occupying mass.  Uncomplicated cholelithiasis.  Nonobstructing punctate right-sided renal calculi.  9 mm hypodensity within the spleen which appears to fill and become isodense to spleen on repeat delayed imaging consistent with a hemangioma.  Extensive colonic diverticulosis without acute diverticulitis. Normal-appearing appendix.  - Called and tramadol D milligram tab as well as Terazosin 2 mg to help pass a kidney stone  Advised to follow-up if no improvement in the next week

## 2015-12-12 NOTE — Addendum Note (Signed)
Addended by: Marvene StaffOX, Clotiel Troop H on: 12/12/2015 11:02 AM   Modules accepted: Orders

## 2015-12-23 ENCOUNTER — Telehealth: Payer: Self-pay | Admitting: Internal Medicine

## 2015-12-23 NOTE — Telephone Encounter (Signed)
Pt is asking for us to call in [facility] on [address], please call in rx for voucher of the basaglar   Pt realized the vouchers do not expire until 2018 so never mind

## 2015-12-30 ENCOUNTER — Ambulatory Visit: Payer: Medicare Other

## 2016-01-09 ENCOUNTER — Telehealth: Payer: Self-pay | Admitting: Adult Health

## 2016-01-09 NOTE — Telephone Encounter (Signed)
error 

## 2016-01-10 ENCOUNTER — Encounter: Payer: Self-pay | Admitting: Adult Health

## 2016-01-10 ENCOUNTER — Ambulatory Visit (INDEPENDENT_AMBULATORY_CARE_PROVIDER_SITE_OTHER): Payer: Medicare Other | Admitting: Adult Health

## 2016-01-10 VITALS — BP 150/72 | Temp 97.9°F | Ht 62.0 in | Wt 144.0 lb

## 2016-01-10 DIAGNOSIS — N200 Calculus of kidney: Secondary | ICD-10-CM

## 2016-01-10 LAB — POCT URINALYSIS DIPSTICK
BILIRUBIN UA: NEGATIVE
Blood, UA: NEGATIVE
KETONES UA: NEGATIVE
LEUKOCYTES UA: NEGATIVE
NITRITE UA: NEGATIVE
PH UA: 6
Spec Grav, UA: 1.025
Urobilinogen, UA: NEGATIVE

## 2016-01-10 MED ORDER — TERAZOSIN HCL 2 MG PO CAPS: 2.0000 mg | ORAL_CAPSULE | Freq: Every day | ORAL | 0 refills | Status: DC

## 2016-01-10 NOTE — Progress Notes (Signed)
Subjective:    Patient ID: Katrina Garrison, female    DOB: Jun 25, 1940, 75 y.o.   MRN: [id number]  HPI  75 year old female who presents to the office today for continued right lower back pain with radaiting pain to right flank. She recently had a CT done which showed 1-2 mm non obstructing kidney stone on the right side. She reports that her pain is intermittently and is described as "sharp".   She has been taking Hytrin as directed and drinking a lot of water. She also reports that Tramadol was making her sleepy so she stopped taking it and has been managing her pain with Motrin PRN   Review of Systems  Constitutional: Negative.   Respiratory: Negative.   Cardiovascular: Negative.   Gastrointestinal: Negative.   Genitourinary: Positive for flank pain.  Musculoskeletal: Positive for back pain.  All other systems reviewed and are negative.  Past Medical History:  Diagnosis Date  . Anemia   . Diabetes mellitus type II 2005  . Diverticulosis   . GERD (gastroesophageal reflux disease)   . History of lithotripsy    x3 for kidney stones  . Hypertension   . Kidney stones     Social History   Social History  . Marital status: Divorced    Spouse name: N/A  . Number of children: N/A  . Years of education: N/A   Occupational History  . Retired    Social History Main Topics  . Smoking status: Former Smoker    Years: 1.50    Quit date: 01/28/1985  . Smokeless tobacco: Not on file  . Alcohol use No  . Drug use: No  . Sexual activity: Not on file   Other Topics Concern  . Not on file   Social History Narrative  . No narrative on file    Past Surgical History:  Procedure Laterality Date  . ABDOMINAL HYSTERECTOMY    . childbirth x 2    . EYE SURGERY     cataract    Family History  Problem Relation Age of Onset  . Thyroid cancer Mother   . Diabetes Mother   . Hypertension Mother   . Alcohol abuse Father   . Hypertension Father   . Multiple sclerosis Daughter     . Multiple sclerosis Daughter   . Multiple sclerosis Sister     Allergies  Allergen Reactions  . Lisinopril Cough  . Metformin And Related Diarrhea  . Sulfamethoxazole     REACTION: "comatose"    Current Outpatient Prescriptions on File Prior to Visit  Medication Sig Dispense Refill  . aspirin 81 MG tablet Take 81 mg by mouth daily.    . Blood Glucose Monitoring Suppl (RELION CONFIRM GLUCOSE MONITOR) W/DEVICE KIT Use as advised 1 kit 1  . Calcium Carbonate-Vit D-Min (CALCIUM 1200) 1200-1000 MG-UNIT CHEW Chew 1 capsule by mouth daily.    . Cholecalciferol (VITAMIN D3) 2000 units TABS Take 2,000 Units by mouth daily. 90 tablet 3  . Cholecalciferol (VITAMIN D3) 3000 units TABS Take 1 tablet by mouth daily. 90 tablet 3  . glucose blood (RELION GLUCOSE TEST STRIPS) test strip Check sugars 3x a day. Dx code: E11.9 200 each 3  . HUMALOG KWIKPEN 100 UNIT/ML KiwkPen INJECT 12 TO 15 UNITS SUBCUTANEOUSLY THREE TIMES DAILY 15 mL 2  . ibuprofen (ADVIL,MOTRIN) 200 MG tablet Take 200 mg by mouth every 6 (six) hours as needed. Reported on 03/22/2015    . Insulin Glargine (BASAGLAR KWIKPEN) 100 UNIT/ML  SOPN INJECT 45 UNITS SUBCUTANEOUSLY AT BEDTIME 15 mL 2  . losartan-hydrochlorothiazide (HYZAAR) 100-25 MG tablet Take 1 tablet by mouth daily. 90 tablet 1  . metoprolol tartrate (LOPRESSOR) 25 MG tablet Take 1 tablet (25 mg total) by mouth 2 (two) times daily. 60 tablet 4  . metoprolol tartrate (LOPRESSOR) 25 MG tablet TAKE ONE-HALF TABLET BY MOUTH TWICE DAILY 30 tablet 3  . nystatin (MYCOSTATIN) 100000 UNIT/ML suspension Take 5 mLs (500,000 Units total) by mouth 4 (four) times daily. 60 mL 0  . pyridOXINE (VITAMIN B-6) 100 MG tablet Take 100 mg by mouth daily. Reported on 08/09/2015    . RELION LANCETS MICRO-THIN 33G MISC Check sugars 3x a day 200 each 3  . RELION PEN NEEDLES 31G X 6 MM MISC USE AS DIRECTED THREE TIMES DAILY 100 each 3  . terazosin (HYTRIN) 2 MG capsule Take 1 capsule (2 mg total) by  mouth at bedtime. 30 capsule 0  . traMADol (ULTRAM) 50 MG tablet Take 1 tablet (50 mg total) by mouth every 8 (eight) hours as needed. 30 tablet 0   Current Facility-Administered Medications on File Prior to Visit  Medication Dose Route Frequency Provider Last Rate Last Dose  . terazosin (HYTRIN) capsule 2 mg  2 mg Oral QHS Tylan Briguglio, NP        BP (!) 150/72   Temp 97.9 F (36.6 C) (Oral)   Ht '5\' 2"'  (1.575 m)   Wt 144 lb (65.3 kg)   BMI 26.34 kg/m       Objective:   Physical Exam  Constitutional: She is oriented to person, place, and time. She appears well-developed and well-nourished. No distress.  Cardiovascular: Normal rate, regular rhythm, normal heart sounds and intact distal pulses.  Exam reveals no gallop and no friction rub.   No murmur heard. Pulmonary/Chest: Effort normal and breath sounds normal. No respiratory distress. She has no wheezes. She has no rales. She exhibits no tenderness.  Abdominal: Soft. Normal appearance and bowel sounds are normal. She exhibits no distension and no mass. There is no tenderness. There is no rebound, no guarding and no CVA tenderness.  Neurological: She is alert and oriented to person, place, and time.  Skin: Skin is warm and dry. No rash noted. She is not diaphoretic. No erythema. No pallor.  Psychiatric: She has a normal mood and affect. Her behavior is normal. Judgment and thought content normal.  Nursing note and vitals reviewed.     Assessment & Plan:  1. Kidney stones - POCT urinalysis dipstick- + protein and glucose. Negative for bacteria or blood - terazosin (HYTRIN) 2 MG capsule; Take 1 capsule (2 mg total) by mouth at bedtime.  Dispense: 30 capsule; Refill: 0 - Continue to force fluids - Follow up if no improvement in the next 2-3 weeks  Dorothyann Peng, NP

## 2016-01-21 ENCOUNTER — Other Ambulatory Visit: Payer: Self-pay | Admitting: Internal Medicine

## 2016-01-22 ENCOUNTER — Ambulatory Visit (INDEPENDENT_AMBULATORY_CARE_PROVIDER_SITE_OTHER): Payer: Medicare Other | Admitting: Internal Medicine

## 2016-01-22 ENCOUNTER — Encounter: Payer: Self-pay | Admitting: Internal Medicine

## 2016-01-22 VITALS — BP 142/80 | HR 72

## 2016-01-22 DIAGNOSIS — E1165 Type 2 diabetes mellitus with hyperglycemia: Secondary | ICD-10-CM | POA: Diagnosis not present

## 2016-01-22 DIAGNOSIS — Z794 Long term (current) use of insulin: Secondary | ICD-10-CM | POA: Diagnosis not present

## 2016-01-22 DIAGNOSIS — E119 Type 2 diabetes mellitus without complications: Secondary | ICD-10-CM | POA: Diagnosis not present

## 2016-01-22 LAB — POCT GLYCOSYLATED HEMOGLOBIN (HGB A1C): HEMOGLOBIN A1C: 10.5

## 2016-01-22 NOTE — Progress Notes (Signed)
Patient ID: Orpah Melteraulette Kintz, female   DOB: [birth year], 75 y.o.   MRN: [id number]  HPI: Orpah Melteraulette Caldron is a 75 y.o.-year-old female, returning for f/u for DM2, dx 2009, insulin-dependent since 2012-13, uncontrolled, without chronic complications, + hospitalization for diabetic coma x 2. Last visit 5 mo ago. She will start [employer].  In January 2017 she started to take the insulin as Rx'ed and to start improving her diet after a family meeting with her children. Sugars started to improve and her last HbA1c was much better. However, she had kidney stones ad GB stones >> she is on pain meds. She also was dx'ed with fatty liver. Sugars were higher.   Latest hemoglobin A1c: Lab Results  Component Value Date   HGBA1C 7.7 08/09/2015   HGBA1C 9.1 03/08/2015   HGBA1C 10.1 (H) 11/21/2014  Anti-islet cell and anti-GAD antibodies were undetectable.  Patient is on the following regimen: - Basaglar 45 units daily at bedtime - Humalog 12-15 units before meals Stopped Metformin in 11/2013 for bloody stools. Stopped Januvia 100 mg daily >> b/c CP. Pt was in the past on a regimen of Humalog 75/25 pen - 50 units daily once a day - but was forgetting the insulin 1/7 days. She was taking the insulin at bedtime >> lows at night and highs, mostly in the 200s but also some in the 300s in am. She is doing better on Levemir.  She was also on Glipizide 10 bid in the past. She had severe diarrhea from metformin before.  Pt checks her sugars 2x a day - brings log for last 2 weeks >> at goal except last 2 days - Christmas: - am:  177-257 >> 103-300 >> 142, 182 >> 136-186 >> 115-151 >> 111-139, 146 - 2 hours after breakfast: 229-280 >> 188, 291 >> 169 >> 111-149 >> 126-148, 242 Christmas - before lunch: 205-224 >> 177-295 >> 153 >> 115-169 >> 120-131 >> 115-129 - 2h after lunch:  103-230, 352 >> 177 >> 121 >> 91, 114-142 >> 123-141 - before dinner:  113-164, 257 >> 153-249 >> 162 >> 114-123 >> 131-150 - after dinner:   205-384 >> 164 >> 196 >> 115-171 >> n/c >> 128-142, 256 Christmas - Bedtime:  192-343 >> 136-160 >> 169, 183 >> 122-172 >> 188 >> 136-156, 252  Christmas Lows: 91 >> 115; she has hypoglycemia awareness at 60.  Highest sugar 188 >> 256  - Pt does not have chronic kidney disease. Last BUN/Cr: Lab Results  Component Value Date   BUN 14 12/11/2015   CREATININE 0.79 12/11/2015  On Losartan. - Last Lipid panel: Lab Results  Component Value Date   CHOL 128 12/04/2015   HDL 41.80 12/04/2015   LDLCALC 64 12/04/2015   TRIG 113.0 12/04/2015   CHOLHDL 3 12/04/2015  She is not on a statin. - Pt's last eye exam was in 02/20/2015. No DR, no macular edema. Has cataracts. Had cataract Sx OU in 2015 - Denies numbness and tingling in her legs - Dr. Angelia MouldAdjoury, podiatry.   She also has a history of asthma, and is on frequent prednisone tapers, last around 02/2012; also HTN;  h/o nephrolithiasis, s/p lithotripsy; GERD.  ROS: Constitutional: no weight gain, + fatigue, no subjective hyperthermia/hypothermia, + nocturia Eyes: no blurry vision, no xerophthalmia ENT: no sore throat, no nodules palpated in throat, no dysphagia/odynophagia, no hoarseness Cardiovascular: no CP/SOB/palpitations/leg swelling Respiratory: + cough/no SOB Gastrointestinal: no N/V/D/C Musculoskeletal: no muscle/joint aches Skin: no rashes Neurological: no tremors/numbness/tingling/dizziness  I reviewed  pt's medications, allergies, PMH, social hx, family hx, and changes were documented in the history of present illness. Otherwise, unchanged from my initial visit note.  PE: BP (!) 142/80   Pulse 72  There is no height or weight on file to calculate BMI. There is no height or weight on file to calculate BMI. Wt Readings from Last 3 Encounters:  01/10/16 144 lb (65.3 kg)  12/11/15 149 lb 12.8 oz (67.9 kg)  12/04/15 150 lb 4.8 oz (68.2 kg)   Constitutional: normal weight, in NAD, looking younger than stated age Eyes: PERRLA,  EOMI, no exophthalmos ENT: moist mucous membranes, no thyromegaly, no cervical lymphadenopathy Cardiovascular: RRR, No MRG Respiratory: CTA B Gastrointestinal: abdomen soft, ND, BS+ Musculoskeletal: no deformities, strength intact in all 4 Skin: moist, warm, no rashes  ASSESSMENT: 1. DM2, insulin-dependent, uncontrolled, without long term complications, but with hyperglycemia She got a rash + itching from Toujeo >> now back on Lantus.   PLAN:  1. Patient with improved diabetes control after starting to follow the recommendations to take her mealtime insulin 3x a day. Her sugars are higher now around Christmastime, but OTW at goal - although she only brings a log for last 2 weeks! - will keep the insulin doses the same and encouraged her to continue the current regimen. - I advised her to:  Patient Instructions  Please continue: - Basaglar 45 units daily at bedtime - Humalog 12-15 units before meals  Please check with your insurance if they cover: - Basaglar, Lantus, Levemir, Toujeo, Tresiba (long acting) - Humalog, Novolog, Apidra (rapid acting)  Please return in 3 months with your sugar log.   - UTD with eye exams - checked HbA1c today >> 10.5% (Much worse!) - per her report, she greatly relaxed her diet around Thanksgiving, but apparently is getting back on track - I will see the patient back in 3 mo with her sugar log   Carlus Pavlovristina Rodney Wigger, MD PhD [facility]

## 2016-01-22 NOTE — Patient Instructions (Addendum)
Please continue: - Basaglar 45 units daily at bedtime - Humalog 12-15 units before meals  Please check with your insurance if they cover: - Basaglar, Lantus, Levemir, Toujeo, Tresiba (long acting) - Humalog, Novolog, Apidra (rapid acting)  Please return in 3 months with your sugar log.

## 2016-01-28 DIAGNOSIS — E1165 Type 2 diabetes mellitus with hyperglycemia: Secondary | ICD-10-CM | POA: Diagnosis not present

## 2016-01-28 DIAGNOSIS — Z961 Presence of intraocular lens: Secondary | ICD-10-CM | POA: Diagnosis not present

## 2016-01-28 DIAGNOSIS — E113293 Type 2 diabetes mellitus with mild nonproliferative diabetic retinopathy without macular edema, bilateral: Secondary | ICD-10-CM | POA: Diagnosis not present

## 2016-01-28 DIAGNOSIS — H40023 Open angle with borderline findings, high risk, bilateral: Secondary | ICD-10-CM | POA: Diagnosis not present

## 2016-01-28 LAB — HM DIABETES EYE EXAM

## 2016-01-29 ENCOUNTER — Encounter: Payer: Self-pay | Admitting: Adult Health

## 2016-02-04 ENCOUNTER — Encounter: Payer: Self-pay | Admitting: Adult Health

## 2016-02-25 ENCOUNTER — Telehealth: Payer: Self-pay | Admitting: Adult Health

## 2016-02-25 NOTE — Telephone Encounter (Signed)
pepto bismol for the diarrhea. Stay hydrated  Mucinex for cough until she can be seen

## 2016-02-25 NOTE — Telephone Encounter (Signed)
Left message for patient to return phone call.  

## 2016-02-25 NOTE — Telephone Encounter (Signed)
Pt would like cory to know she has a terrible cough and upper resp issue going on, but cannot come in because she also has diarrhea.  Pt states she would like a call back.   [facility]/ [address]

## 2016-02-25 NOTE — Telephone Encounter (Signed)
Is there anything that we can do for patient before I call?

## 2016-02-28 ENCOUNTER — Ambulatory Visit (INDEPENDENT_AMBULATORY_CARE_PROVIDER_SITE_OTHER): Payer: Medicare HMO | Admitting: Adult Health

## 2016-02-28 VITALS — BP 164/90 | Temp 98.5°F | Ht 62.0 in | Wt 146.6 lb

## 2016-02-28 DIAGNOSIS — I10 Essential (primary) hypertension: Secondary | ICD-10-CM | POA: Diagnosis not present

## 2016-02-28 DIAGNOSIS — J40 Bronchitis, not specified as acute or chronic: Secondary | ICD-10-CM | POA: Diagnosis not present

## 2016-02-28 DIAGNOSIS — J0140 Acute pansinusitis, unspecified: Secondary | ICD-10-CM | POA: Diagnosis not present

## 2016-02-28 MED ORDER — METOPROLOL TARTRATE 25 MG PO TABS: 25.0000 mg | ORAL_TABLET | Freq: Two times a day (BID) | ORAL | 3 refills | Status: DC

## 2016-02-28 MED ORDER — DOXYCYCLINE HYCLATE 100 MG PO CAPS: 100.0000 mg | ORAL_CAPSULE | Freq: Two times a day (BID) | ORAL | 0 refills | Status: DC

## 2016-02-28 MED ORDER — METHYLPREDNISOLONE ACETATE 80 MG/ML IJ SUSP: 80.0000 mg | Freq: Once | INTRAMUSCULAR | Status: DC

## 2016-02-28 NOTE — Progress Notes (Signed)
Subjective:    Patient ID: Katrina Garrison, female    DOB: [birth year], 76 y.o.   MRN: [id number]  URI   This is a new problem. The current episode started in the past 7 days. There has been no fever. Associated symptoms include congestion, coughing, headaches, rhinorrhea, sinus pain and wheezing. Pertinent negatives include no abdominal pain, ear pain, plugged ear sensation or sore throat.    She needs a refill on Lopressor. She is currently taking it 25 mg daily. I will up her dose to 25 mg BID  Review of Systems  Constitutional: Positive for activity change and fatigue. Negative for appetite change, chills, diaphoresis and fever.  HENT: Positive for congestion, postnasal drip, rhinorrhea, sinus pain and sinus pressure. Negative for ear pain, sore throat and trouble swallowing.   Respiratory: Positive for cough, shortness of breath and wheezing.   Cardiovascular: Negative.   Gastrointestinal: Negative for abdominal pain.  Neurological: Positive for headaches.  All other systems reviewed and are negative.  Past Medical History:  Diagnosis Date  . Anemia   . Diabetes mellitus type II 2005  . Diverticulosis   . GERD (gastroesophageal reflux disease)   . History of lithotripsy    x3 for kidney stones  . Hypertension   . Kidney stones     Social History   Social History  . Marital status: Divorced    Spouse name: N/A  . Number of children: N/A  . Years of education: N/A   Occupational History  . Retired    Social History Main Topics  . Smoking status: Former Smoker    Years: 1.50    Quit date: 01/28/1985  . Smokeless tobacco: Not on file  . Alcohol use No  . Drug use: No  . Sexual activity: Not on file   Other Topics Concern  . Not on file   Social History Narrative  . No narrative on file    Past Surgical History:  Procedure Laterality Date  . ABDOMINAL HYSTERECTOMY    . childbirth x 2    . EYE SURGERY     cataract    Family History  Problem Relation  Age of Onset  . Thyroid cancer Mother   . Diabetes Mother   . Hypertension Mother   . Alcohol abuse Father   . Hypertension Father   . Multiple sclerosis Daughter   . Multiple sclerosis Daughter   . Multiple sclerosis Sister     Allergies  Allergen Reactions  . Lisinopril Cough  . Metformin And Related Diarrhea  . Sulfamethoxazole     REACTION: "comatose"  . Toujeo Solostar [Insulin Glargine] Rash    Current Outpatient Prescriptions on File Prior to Visit  Medication Sig Dispense Refill  . aspirin 81 MG tablet Take 81 mg by mouth daily.    . Blood Glucose Monitoring Suppl (RELION CONFIRM GLUCOSE MONITOR) W/DEVICE KIT Use as advised 1 kit 1  . glucose blood (RELION GLUCOSE TEST STRIPS) test strip Check sugars 3x a day. Dx code: E11.9 200 each 3  . HUMALOG KWIKPEN 100 UNIT/ML KiwkPen INJECT 12 TO 15 UNITS SUBCUTANEOUSLY THREE TIMES DAILY 15 mL 2  . ibuprofen (ADVIL,MOTRIN) 200 MG tablet Take 200 mg by mouth every 6 (six) hours as needed. Reported on 03/22/2015    . Insulin Glargine (BASAGLAR KWIKPEN) 100 UNIT/ML SOPN INJECT 45 UNITS SUBCUTANEOUSLY AT BEDTIME 15 mL 2  . losartan-hydrochlorothiazide (HYZAAR) 100-25 MG tablet Take 1 tablet by mouth daily. 90 tablet 1  . nystatin (  MYCOSTATIN) 100000 UNIT/ML suspension Take 5 mLs (500,000 Units total) by mouth 4 (four) times daily. 60 mL 0  . RELION LANCETS MICRO-THIN 33G MISC Check sugars 3x a day 200 each 3  . RELION PEN NEEDLES 31G X 6 MM MISC USE AS DIRECTED THREE TIMES DAILY 100 each 3  . terazosin (HYTRIN) 2 MG capsule Take 1 capsule (2 mg total) by mouth at bedtime. 30 capsule 0  . traMADol (ULTRAM) 50 MG tablet Take 1 tablet (50 mg total) by mouth every 8 (eight) hours as needed. 30 tablet 0   No current facility-administered medications on file prior to visit.     BP (!) 164/90   Temp 98.5 F (36.9 C) (Oral)   Ht '5\' 2"'  (1.575 m)   Wt 146 lb 9.6 oz (66.5 kg)   BMI 26.81 kg/m       Objective:   Physical Exam    Constitutional: She is oriented to person, place, and time. She appears well-developed and well-nourished. No distress.  HENT:  Right Ear: Hearing, tympanic membrane, external ear and ear canal normal.  Left Ear: Hearing, tympanic membrane, external ear and ear canal normal.  Nose: Mucosal edema and rhinorrhea present. Right sinus exhibits maxillary sinus tenderness and frontal sinus tenderness. Left sinus exhibits maxillary sinus tenderness and frontal sinus tenderness.  Mouth/Throat: Oropharynx is clear and moist and mucous membranes are normal.  Cardiovascular: Normal rate, regular rhythm, normal heart sounds and intact distal pulses.  Exam reveals no gallop.   No murmur heard. Pulmonary/Chest: No respiratory distress. She has wheezes (trace throughout. Dry hacking cough ). She has no rales. She exhibits no tenderness.  Neurological: She is alert and oriented to person, place, and time.  Skin: Skin is warm and dry. No rash noted. She is not diaphoretic. No erythema. No pallor.  Psychiatric: She has a normal mood and affect. Her behavior is normal. Judgment and thought content normal.  Nursing note and vitals reviewed.     Assessment & Plan:  1. Bronchitis - doxycycline (VIBRAMYCIN) 100 MG capsule; Take 1 capsule (100 mg total) by mouth 2 (two) times daily.  Dispense: 14 capsule; Refill: 0 - methylPREDNISolone acetate (DEPO-MEDROL) injection 80 mg; Inject 1 mL (80 mg total) into the muscle once. - Follow up if no improvement   2. Acute non-recurrent pansinusitis - doxycycline (VIBRAMYCIN) 100 MG capsule; Take 1 capsule (100 mg total) by mouth 2 (two) times daily.  Dispense: 14 capsule; Refill: 0 - Add Flonase  3. Essential hypertension - metoprolol tartrate (LOPRESSOR) 25 MG tablet; Take 1 tablet (25 mg total) by mouth 2 (two) times daily.  Dispense: 180 tablet; Refill: 3 - Monitor BP at home.  - Follow up in 2-4 weeks  Dorothyann Peng, NP

## 2016-03-25 ENCOUNTER — Ambulatory Visit: Payer: Medicare Other | Admitting: Internal Medicine

## 2016-04-28 ENCOUNTER — Ambulatory Visit: Payer: Medicare HMO | Admitting: Internal Medicine

## 2016-05-22 ENCOUNTER — Encounter: Payer: Self-pay | Admitting: Internal Medicine

## 2016-05-22 ENCOUNTER — Telehealth: Payer: Self-pay | Admitting: Internal Medicine

## 2016-05-22 ENCOUNTER — Ambulatory Visit (INDEPENDENT_AMBULATORY_CARE_PROVIDER_SITE_OTHER): Payer: Medicare HMO | Admitting: Internal Medicine

## 2016-05-22 VITALS — BP 162/94 | HR 86 | Ht 61.5 in | Wt 147.2 lb

## 2016-05-22 DIAGNOSIS — Z6827 Body mass index (BMI) 27.0-27.9, adult: Secondary | ICD-10-CM | POA: Diagnosis not present

## 2016-05-22 DIAGNOSIS — Z7982 Long term (current) use of aspirin: Secondary | ICD-10-CM | POA: Diagnosis not present

## 2016-05-22 DIAGNOSIS — I10 Essential (primary) hypertension: Secondary | ICD-10-CM | POA: Diagnosis not present

## 2016-05-22 DIAGNOSIS — E113299 Type 2 diabetes mellitus with mild nonproliferative diabetic retinopathy without macular edema, unspecified eye: Secondary | ICD-10-CM | POA: Diagnosis not present

## 2016-05-22 DIAGNOSIS — E1165 Type 2 diabetes mellitus with hyperglycemia: Secondary | ICD-10-CM

## 2016-05-22 DIAGNOSIS — Z0000 Encounter for general adult medical examination without abnormal findings: Secondary | ICD-10-CM | POA: Diagnosis not present

## 2016-05-22 DIAGNOSIS — Z87891 Personal history of nicotine dependence: Secondary | ICD-10-CM | POA: Diagnosis not present

## 2016-05-22 DIAGNOSIS — E119 Type 2 diabetes mellitus without complications: Secondary | ICD-10-CM | POA: Diagnosis not present

## 2016-05-22 DIAGNOSIS — Z79899 Other long term (current) drug therapy: Secondary | ICD-10-CM | POA: Diagnosis not present

## 2016-05-22 DIAGNOSIS — Z794 Long term (current) use of insulin: Secondary | ICD-10-CM | POA: Diagnosis not present

## 2016-05-22 LAB — POCT GLYCOSYLATED HEMOGLOBIN (HGB A1C): Hemoglobin A1C: 8.4

## 2016-05-22 MED ORDER — INSULIN ASPART 100 UNIT/ML FLEXPEN: 12.0000 [IU] | PEN_INJECTOR | Freq: Three times a day (TID) | SUBCUTANEOUS | 11 refills | Status: DC

## 2016-05-22 NOTE — Telephone Encounter (Signed)
Please advise if ok to refill. Rx is not on current medication list, Thanks!

## 2016-05-22 NOTE — Patient Instructions (Addendum)
Please continue: - Basaglar 45 units daily at bedtime  Please change to: - Novolog 12-15 units before meals  Please inject Novolog 10-15 min before meals.  Please return in 4 months with your sugar log.

## 2016-05-22 NOTE — Telephone Encounter (Signed)
Refill submitted. 

## 2016-05-22 NOTE — Telephone Encounter (Signed)
OK to send.

## 2016-05-22 NOTE — Progress Notes (Signed)
Patient ID: Katrina Garrison, female   DOB: [birth year], 76 y.o.   MRN: [id number]  HPI: Katrina Garrison is a 76 y.o.-year-old female, returning for f/u for DM2, dx 2009, insulin-dependent since 2012-13, uncontrolled, with complications (DR, + hospitalization for diabetic coma x 2). Last visit 4 mo ago. She now has [employer].  She started work since last visit. She works 8 hours a day, 4 days a week in [employer].  Latest hemoglobin A1c: Lab Results  Component Value Date   HGBA1C 10.5 01/22/2016   HGBA1C 7.7 08/09/2015   HGBA1C 9.1 03/08/2015  Anti-islet cell and anti-GAD antibodies were undetectable.  Patient is on the following regimen: - Basaglar 45 units daily at bedtime - Humalog 12-15 units before meals >> she missed lunchtime insulin at work. She checked on insurance coverage >> Novolog covered >> will need to switch Stopped Metformin in 11/2013 for bloody stools. Stopped Januvia 100 mg daily >> b/c CP. Pt was in the past on a regimen of Humalog 75/25 pen - 50 units daily once a day - but was forgetting the insulin 1/7 days. She was taking the insulin at bedtime >> lows at night and highs, mostly in the 200s but also some in the 300s in am. She was also on Glipizide 10 bid in the past. She had severe diarrhea from metformin before.  Pt checks her sugars 3-4x a day, rotating checks: - am:   136-186 >> 115-151 >> 111-139, 146 >> 142-166 - 2 hours after breakfast: 111-149 >> 126-148, 242 Christmas >> 126-140 - before lunch:  153 >> 115-169 >> 120-131 >> 115-129 >> 136-177 - 2h after lunch: 121 >> 91, 114-142 >> 123-141 >> 138-171 - before dinner: 162 >> 114-123 >> 131-150 >>  132-141 - after dinner: 115-171 >> n/c >> 128-142, 256 Christmas >> 133-194 - Bedtime: 122-172 >> 188 >> 136-156, 252  Christmas >> 121, 149 Lows: 91 >> 115 >> 21; she has hypoglycemia awareness at 60.  Highest sugar 188 >> 256 >> 194 x1  - No CKD. Last BUN/Cr: Lab Results  Component Value Date    BUN 14 12/11/2015   CREATININE 0.79 12/11/2015  On Losartan. - Last Lipid panel: Lab Results  Component Value Date   CHOL 128 12/04/2015   HDL 41.80 12/04/2015   LDLCALC 64 12/04/2015   TRIG 113.0 12/04/2015   CHOLHDL 3 12/04/2015  She is not on a statin. - Pt's last eye exam was in 01/28/2016. 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 Has cataracts. Had cataract Sx OU in 2015. - Denies numbness and tingling in her legs - Dr. Angelia Mould, podiatry.   She also has a history of asthma, and is on frequent prednisone tapers, last around 02/2012; also HTN;  h/o nephrolithiasis, s/p lithotripsy; GERD.  She stopped metoprolol since last visit.  ROS: Constitutional: no weight gain/no weight loss, no fatigue, no subjective hyperthermia, no subjective hypothermia Eyes: no blurry vision, no xerophthalmia ENT: no sore throat, no nodules palpated in throat, no dysphagia, no odynophagia, no hoarseness Cardiovascular: no CP/no SOB/no palpitations/no leg swelling Respiratory: no cough/no SOB/no wheezing Gastrointestinal: no N/no V/no D/no C/no acid reflux Musculoskeletal: no muscle aches/no joint aches Skin: no rashes, no hair loss Neurological: no tremors/no numbness/no tingling/no dizziness  I reviewed pt's medications, allergies, PMH, social hx, family hx, and changes were documented in the history of present illness. Otherwise, unchanged from my initial visit note.  PE: BP (!) 162/94   Pulse 86   Ht 5' 1.5" (1.562  m)   Wt 147 lb 4 oz (66.8 kg)   SpO2 97%   BMI 27.37 kg/m  Body mass index is 27.37 kg/m. Body mass index is 27.37 kg/m. Wt Readings from Last 3 Encounters:  02/28/16 146 lb 9.6 oz (75 kg)  01/10/16 144 lb (65.3 kg)  12/11/15 149 lb 12.8 oz (67.9 kg)   Constitutional: normal weight, in NAD Eyes: PERRLA, EOMI, no exophthalmos ENT: moist mucous membranes, no thyromegaly, no cervical lymphadenopathy Cardiovascular: RRR, No MRG Respiratory: CTA B Gastrointestinal: abdomen soft, NT, ND,  BS+ Musculoskeletal: no deformities, strength intact in all 4 Skin: moist, warm, no rashes Neurological: no tremor with outstretched hands, DTR normal in all 4   ASSESSMENT: 1. DM2, insulin-dependent, uncontrolled, with complications - DR She got a rash + itching from Toujeo.  PLAN:  1. Patient with initially improved diabetes control after starting followed the treatment recommendations to take her mealtime insulin. Times a day. However, at last visit, her sugars were higher, around the [place]. I again strongly advised her to be compliant with her insulin regimen, but we did not change the doses then. At this visit, her sugars are slightly higher, but overall improved from the time of the holidays. I again emphasized the need to inject rapid acting insulin before every meal but she continues to have a hard time believing that she is actually needing this. I showed her the HbA1c that we obtained when she was taking her mealtime insulin and this was in the 7%s. Afterwards, with relaxing her diet and skipping mealtime insulin, if increased to 10.1%. She is now taking Humalog with most meals, but he almost always misses lunch time Humalog as she is at work. For insurance preference, we'll switch to NovoLog, and also give her hands, which would be easier to use even at work. - As of now, she is injecting her mealtime insulin Without a clear relationship with the meals. I advised her to move this 10-15 minutes before a meal.  - I advised her to:  Patient Instructions  Please continue: - Basaglar 45 units daily at bedtime  Please change to: - Novolog 12-15 units before meals  Please inject Novolog 10-15 min before meals.  Please return in 4 months with your sugar log.    - Up to date with her eye exams. New exam showed background diabetic retinopathy. - checked HbA1c today >> 8.4% (better) - I will see the patient back in 4 months, per her preference  Carlus Pavlov, MD PhD [facility]  Endocrinology

## 2016-05-27 ENCOUNTER — Other Ambulatory Visit: Payer: Self-pay

## 2016-05-27 MED ORDER — INSULIN GLARGINE 100 UNIT/ML SOLOSTAR PEN: PEN_INJECTOR | SUBCUTANEOUS | 0 refills | Status: DC

## 2016-05-27 NOTE — Telephone Encounter (Signed)
Changed from Basaglar to Lantus, after basaglar was not covered by insurance and was rejected. Paper fax Dr.Gherghe gave permission to change to either Lantus, Levemir, or [place]. Same dose.

## 2016-06-03 ENCOUNTER — Ambulatory Visit: Payer: Medicare Other | Admitting: Adult Health

## 2016-06-05 ENCOUNTER — Ambulatory Visit: Payer: Medicare HMO | Admitting: Adult Health

## 2016-08-07 ENCOUNTER — Encounter: Payer: Self-pay | Admitting: Adult Health

## 2016-08-07 ENCOUNTER — Ambulatory Visit (INDEPENDENT_AMBULATORY_CARE_PROVIDER_SITE_OTHER): Payer: Medicare HMO | Admitting: Adult Health

## 2016-08-07 VITALS — BP 160/100 | HR 85 | Temp 98.8°F | Ht 61.5 in | Wt 147.0 lb

## 2016-08-07 DIAGNOSIS — R51 Headache: Secondary | ICD-10-CM

## 2016-08-07 DIAGNOSIS — R252 Cramp and spasm: Secondary | ICD-10-CM | POA: Diagnosis not present

## 2016-08-07 DIAGNOSIS — R251 Tremor, unspecified: Secondary | ICD-10-CM

## 2016-08-07 DIAGNOSIS — R519 Headache, unspecified: Secondary | ICD-10-CM

## 2016-08-07 DIAGNOSIS — I10 Essential (primary) hypertension: Secondary | ICD-10-CM | POA: Diagnosis not present

## 2016-08-07 LAB — CBC WITH DIFFERENTIAL/PLATELET
BASOS ABS: 0 10*3/uL (ref 0.0–0.1)
BASOS PCT: 0.3 % (ref 0.0–3.0)
EOS PCT: 1.1 % (ref 0.0–5.0)
Eosinophils Absolute: 0.1 10*3/uL (ref 0.0–0.7)
HEMATOCRIT: 38.7 % (ref 36.0–46.0)
Hemoglobin: 12.8 g/dL (ref 12.0–15.0)
LYMPHS ABS: 1.4 10*3/uL (ref 0.7–4.0)
LYMPHS PCT: 22.4 % (ref 12.0–46.0)
MCHC: 32.9 g/dL (ref 30.0–36.0)
MCV: 83.3 fl (ref 78.0–100.0)
MONOS PCT: 4.6 % (ref 3.0–12.0)
Monocytes Absolute: 0.3 10*3/uL (ref 0.1–1.0)
NEUTROS ABS: 4.6 10*3/uL (ref 1.4–7.7)
NEUTROS PCT: 71.6 % (ref 43.0–77.0)
PLATELETS: 235 10*3/uL (ref 150.0–400.0)
RBC: 4.65 Mil/uL (ref 3.87–5.11)
RDW: 14.2 % (ref 11.5–15.5)
WBC: 6.5 10*3/uL (ref 4.0–10.5)

## 2016-08-07 LAB — MAGNESIUM: Magnesium: 1.7 mg/dL (ref 1.5–2.5)

## 2016-08-07 LAB — BASIC METABOLIC PANEL
BUN: 16 mg/dL (ref 6–23)
CHLORIDE: 105 meq/L (ref 96–112)
CO2: 26 meq/L (ref 19–32)
Calcium: 9.8 mg/dL (ref 8.4–10.5)
Creatinine, Ser: 1.04 mg/dL (ref 0.40–1.20)
GFR: 66.36 mL/min (ref >60.00)
GLUCOSE: 233 mg/dL — AB (ref 70–99)
POTASSIUM: 4 meq/L (ref 3.5–5.1)
SODIUM: 140 meq/L (ref 135–145)

## 2016-08-07 MED ORDER — AMLODIPINE BESYLATE 5 MG PO TABS: 5.0000 mg | ORAL_TABLET | Freq: Every day | ORAL | 3 refills | Status: DC

## 2016-08-07 NOTE — Progress Notes (Signed)
Subjective:    Patient ID: Katrina Garrison, female    DOB: [birth year], 76 y.o.   MRN: [id number]  HPI  76 year old female who  has a past medical history of Anemia; Diabetes mellitus type II (2005); Diverticulosis; GERD (gastroesophageal reflux disease); History of lithotripsy; Hypertension; and Kidney stones. She presents to the office today with multiple vague complaints, none of which she is currently experiencing.   1. "Cramping" pain down her left leg. The pain starts in the knee and radiates down the lateral side of her left leg. This has been an on again, off again occurrence over the last 3 weeks.   2. Left sided throbbing headache that started less than 7 days ago. Headaches are intermittent. Denies any blurred vision, dizziness, or lightheadedness. Has no ear or scalp pain.   3. Tremor in her left hand. Again, this is an intermittent issues. The tremor is associated with a " sharp, jolt like pain". This only lasts for a few moments. She has this tremor on the way to the office today. Symptoms have been present for 2 weeks. Denies any tremor from shoulder to wrist. No numbness or tingling.   There have been no medication changes  Review of Systems  Constitutional: Negative.   HENT: Negative.   Respiratory: Negative.   Cardiovascular: Negative.   Gastrointestinal: Negative.   Musculoskeletal: Positive for myalgias.  Neurological: Positive for tremors and headaches. Negative for weakness.  All other systems reviewed and are negative.  Past Medical History:  Diagnosis Date  . Anemia   . Diabetes mellitus type II 2005  . Diverticulosis   . GERD (gastroesophageal reflux disease)   . History of lithotripsy    x3 for kidney stones  . Hypertension   . Kidney stones     Social History   Social History  . Marital status: Divorced    Spouse name: N/A  . Number of children: N/A  . Years of education: N/A   Occupational History  . Retired    Social History Main Topics   . Smoking status: Former Smoker    Years: 1.50    Quit date: 01/28/1985  . Smokeless tobacco: Never Used  . Alcohol use No  . Drug use: No  . Sexual activity: Not on file   Other Topics Concern  . Not on file   Social History Narrative  . No narrative on file    Past Surgical History:  Procedure Laterality Date  . ABDOMINAL HYSTERECTOMY    . childbirth x 2    . EYE SURGERY     cataract    Family History  Problem Relation Age of Onset  . Thyroid cancer Mother   . Diabetes Mother   . Hypertension Mother   . Alcohol abuse Father   . Hypertension Father   . Multiple sclerosis Daughter   . Multiple sclerosis Daughter   . Multiple sclerosis Sister     Allergies  Allergen Reactions  . Lisinopril Cough  . Metformin And Related Diarrhea  . Metoprolol Swelling    Leg cramping   . Sulfamethoxazole     REACTION: "comatose"  . Toujeo Solostar [Insulin Glargine] Rash    Current Outpatient Prescriptions on File Prior to Visit  Medication Sig Dispense Refill  . aspirin 81 MG tablet Take 81 mg by mouth daily.    . Blood Glucose Monitoring Suppl (RELION CONFIRM GLUCOSE MONITOR) W/DEVICE KIT Use as advised 1 kit 1  . glucose blood (RELION GLUCOSE TEST  STRIPS) test strip Check sugars 3x a day. Dx code: E11.9 200 each 3  . ibuprofen (ADVIL,MOTRIN) 200 MG tablet Take 200 mg by mouth every 6 (six) hours as needed. Reported on 03/22/2015    . insulin aspart (NOVOLOG) 100 UNIT/ML FlexPen Inject 12-15 Units into the skin 3 (three) times daily before meals. 15 mL 11  . RELION LANCETS MICRO-THIN 33G MISC Check sugars 3x a day 200 each 3  . RELION PEN NEEDLES 31G X 6 MM MISC USE AS DIRECTED THREE TIMES DAILY 100 each 3  . traMADol (ULTRAM) 50 MG tablet Take 1 tablet (50 mg total) by mouth every 8 (eight) hours as needed. 30 tablet 0   Current Facility-Administered Medications on File Prior to Visit  Medication Dose Route Frequency Provider Last Rate Last Dose  . methylPREDNISolone  acetate (DEPO-MEDROL) injection 80 mg  80 mg Intramuscular Once Alvina Strother, NP        BP (!) 160/100   Pulse 85   Temp 98.8 F (37.1 C) (Oral)   Ht 5' 1.5" (1.562 m)   Wt 147 lb (66.7 kg)   SpO2 96%   BMI 27.33 kg/m       Objective:   Physical Exam  Constitutional: She is oriented to person, place, and time. She appears well-developed and well-nourished. No distress.  Eyes: Pupils are equal, round, and reactive to light. Conjunctivae and EOM are normal. Right eye exhibits no discharge. Left eye exhibits no discharge. No scleral icterus.  Cardiovascular: Normal rate, regular rhythm, normal heart sounds and intact distal pulses.  Exam reveals no gallop and no friction rub.   No murmur heard. Pulmonary/Chest: Effort normal and breath sounds normal. No respiratory distress. She has no wheezes. She has no rales. She exhibits no tenderness.  Neurological: She is alert and oriented to person, place, and time. She has normal strength and normal reflexes. She displays normal reflexes. No cranial nerve deficit. She exhibits normal muscle tone. She displays a negative Romberg sign. Coordination normal. GCS eye subscore is 4. GCS verbal subscore is 5. GCS motor subscore is 6.  5/5 grip strength in bilateral upper extremities.  No bruising, redness, warmth, or tenderness in left lower extremity    Skin: Skin is warm and dry. No rash noted. She is not diaphoretic. No erythema. No pallor.  Psychiatric: She has a normal mood and affect. Her behavior is normal. Judgment and thought content normal.  Nursing note and vitals reviewed.     Assessment & Plan:  1. Muscle cramping - Unknown cause at this time. Will check basic labs and magnesium  - make sure she is staying well hydrated  - Basic metabolic panel - Magnesium - CBC with Differential/Platelet  2. Nonintractable episodic headache, unspecified headache type - Possibly related to hypertension?  - Basic metabolic panel - Magnesium -  CBC with Differential/Platelet  3. Essential hypertension - Not at goal. Will add Norvasc 5 mg.  - Follow up in one month  - amLODipine (NORVASC) 5 MG tablet; Take 1 tablet (5 mg total) by mouth daily.  Dispense: 90 tablet; Refill: 3  4. Tremor of left hand - Unknown cause. No concern for parkinsons. Will get basic labs to check to electrolyte abnormality. Consider referral to neurology  - Basic metabolic panel - Magnesium - CBC with Differential/Platelet  Dorothyann Peng, NP

## 2016-08-10 ENCOUNTER — Other Ambulatory Visit: Payer: Self-pay

## 2016-08-10 ENCOUNTER — Telehealth: Payer: Self-pay

## 2016-08-10 MED ORDER — INSULIN DEGLUDEC 100 UNIT/ML ~~LOC~~ SOPN: PEN_INJECTOR | SUBCUTANEOUS | 0 refills | Status: DC

## 2016-08-10 NOTE — Telephone Encounter (Signed)
Im sorry I accidentally threw the paper away with the options.   It was Lantus, and what else for the options. I states she has an allergy to Lantus.

## 2016-08-10 NOTE — Telephone Encounter (Signed)
Added to list. And submitted to pharmacy

## 2016-08-10 NOTE — Telephone Encounter (Signed)
Please advise, we received notification that Katrina Garrison was no longer covered by insurance, you gave the okay to switch, but now I do not even see the Basaglar on the med list. Okay to add the switched medication?  Thanks!

## 2016-08-10 NOTE — Telephone Encounter (Signed)
lET'S TRY tRESIBA

## 2016-08-10 NOTE — Telephone Encounter (Signed)
Yes

## 2016-08-11 ENCOUNTER — Telehealth: Payer: Self-pay | Admitting: Adult Health

## 2016-08-11 DIAGNOSIS — H40023 Open angle with borderline findings, high risk, bilateral: Secondary | ICD-10-CM | POA: Diagnosis not present

## 2016-08-11 DIAGNOSIS — H524 Presbyopia: Secondary | ICD-10-CM | POA: Diagnosis not present

## 2016-08-11 DIAGNOSIS — Z8679 Personal history of other diseases of the circulatory system: Secondary | ICD-10-CM | POA: Diagnosis not present

## 2016-08-11 NOTE — Telephone Encounter (Signed)
Kandee KeenCory pt returned your called and stated that she will be at home for the rest of the day and will looking forward to hearing from you today.

## 2016-08-11 NOTE — Telephone Encounter (Signed)
Spoke to patient and informed her of her labs   She has not had any other symptoms. Advised to follow up with any headaches or tremors

## 2016-08-11 NOTE — Telephone Encounter (Signed)
Left message to call back re labs

## 2016-08-21 ENCOUNTER — Telehealth: Payer: Self-pay | Admitting: Adult Health

## 2016-08-21 NOTE — Telephone Encounter (Signed)
Katrina Garrison pt is calling wanting to move forward with the Neurology appointment that you all discussed.

## 2016-08-26 NOTE — Telephone Encounter (Signed)
Cory pt returned your call and would like for you to call her at your convenience.

## 2016-08-27 ENCOUNTER — Telehealth: Payer: Self-pay | Admitting: Adult Health

## 2016-08-27 NOTE — Telephone Encounter (Signed)
Spoke with patient on the phone and she reports that at times she is having worsening tremors in her upper extremities. She will make an appointment to come to the office. I would like to send her to neurology but she is hesitant at this time

## 2016-09-04 DIAGNOSIS — G629 Polyneuropathy, unspecified: Secondary | ICD-10-CM | POA: Diagnosis not present

## 2016-09-04 DIAGNOSIS — E1351 Other specified diabetes mellitus with diabetic peripheral angiopathy without gangrene: Secondary | ICD-10-CM | POA: Diagnosis not present

## 2016-09-04 DIAGNOSIS — M79671 Pain in right foot: Secondary | ICD-10-CM | POA: Diagnosis not present

## 2016-09-04 DIAGNOSIS — M79672 Pain in left foot: Secondary | ICD-10-CM | POA: Diagnosis not present

## 2016-09-04 DIAGNOSIS — G603 Idiopathic progressive neuropathy: Secondary | ICD-10-CM | POA: Diagnosis not present

## 2016-09-14 DIAGNOSIS — R202 Paresthesia of skin: Secondary | ICD-10-CM | POA: Diagnosis not present

## 2016-09-21 ENCOUNTER — Ambulatory Visit: Payer: Medicare HMO | Admitting: Internal Medicine

## 2016-09-22 DIAGNOSIS — M79672 Pain in left foot: Secondary | ICD-10-CM | POA: Diagnosis not present

## 2016-09-22 DIAGNOSIS — E1351 Other specified diabetes mellitus with diabetic peripheral angiopathy without gangrene: Secondary | ICD-10-CM | POA: Diagnosis not present

## 2016-10-07 ENCOUNTER — Telehealth: Payer: Self-pay | Admitting: Adult Health

## 2016-10-07 ENCOUNTER — Other Ambulatory Visit: Payer: Self-pay | Admitting: Internal Medicine

## 2016-10-07 MED ORDER — FREESTYLE LIBRE SENSOR SYSTEM MISC: 1.0000 | Freq: Three times a day (TID) | 11 refills | Status: DC

## 2016-10-07 NOTE — Telephone Encounter (Signed)
That is fine 

## 2016-10-07 NOTE — Telephone Encounter (Signed)
° ° ° ° °  Pt is requesting the meter called  free style libre system

## 2016-10-07 NOTE — Telephone Encounter (Signed)
Freestyle Libre system rx sent to pharmacy

## 2016-10-07 NOTE — Telephone Encounter (Signed)
Pt and daughters is very concerned about pts sugar levels it has been in the 400's and highest has been 446 and the lowest had been 138 and at times it has been hard for them to wake her up.  Pt would like to know if it is okay for the pt to take ALA supplement.  Pt would like to have a monitor to help to monitor her blood sugars to know when they are spiking it is a device/patch that is put on the arm (they are not sure of the name of the device).  Pt would like to have a call back

## 2016-10-07 NOTE — Telephone Encounter (Signed)
OK to send new CBM?

## 2016-11-06 ENCOUNTER — Ambulatory Visit: Payer: Medicare HMO | Admitting: Internal Medicine

## 2016-11-17 ENCOUNTER — Ambulatory Visit: Payer: Medicare HMO | Admitting: Internal Medicine

## 2016-11-19 ENCOUNTER — Encounter: Payer: Self-pay | Admitting: Internal Medicine

## 2016-11-19 ENCOUNTER — Ambulatory Visit (INDEPENDENT_AMBULATORY_CARE_PROVIDER_SITE_OTHER): Payer: Medicare HMO | Admitting: Internal Medicine

## 2016-11-19 VITALS — BP 174/104 | HR 90 | Ht 61.5 in | Wt 146.0 lb

## 2016-11-19 DIAGNOSIS — Z23 Encounter for immunization: Secondary | ICD-10-CM

## 2016-11-19 DIAGNOSIS — I10 Essential (primary) hypertension: Secondary | ICD-10-CM

## 2016-11-19 DIAGNOSIS — Z794 Long term (current) use of insulin: Secondary | ICD-10-CM | POA: Diagnosis not present

## 2016-11-19 DIAGNOSIS — E1165 Type 2 diabetes mellitus with hyperglycemia: Secondary | ICD-10-CM | POA: Diagnosis not present

## 2016-11-19 LAB — POCT GLYCOSYLATED HEMOGLOBIN (HGB A1C): HEMOGLOBIN A1C: 7.7

## 2016-11-19 NOTE — Progress Notes (Signed)
Patient ID: Katrina Garrison, female   DOB: 1940/08/16, 76 y.o.   MRN: [id number]  HPI: Katrina Garrison is a 77 y.o.-year-old female, returning for f/u for DM2, dx 2009, insulin-dependent since 2012-13, uncontrolled, with complications (DR, + hospitalization for diabetic coma x 2, PN). Last visit 6 mo ago. She now has [employer].  She has L leg neuropathy >> excruciating pain in L foot >> saw Dr. Ralene Bathe. She uses Biofreeze, and 2 other creams.  She continues to improve her diet. Eating more veggies, less processed foods.  Latest hemoglobin A1c: Lab Results  Component Value Date   HGBA1C 8.4 05/22/2016   HGBA1C 10.5 01/22/2016   HGBA1C 7.7 08/09/2015  Anti-islet cell and anti-GAD antibodies were undetectable.  Patient is on the following regimen: - Basaglar 45 units daily at bedtime >> Tresiba 45 units at bedtime - Humalog 12-15 units before meals >> Novolog 12-15 units before meals - misses dinner dose Stopped Metformin in 11/2013 for bloody stools. Stopped Januvia 100 mg daily >> b/c CP. Pt was in the past on a regimen of Humalog 75/25 pen - 50 units daily once a day - but was forgetting the insulin 1/7 days. She was taking the insulin at bedtime >> lows at night and highs, mostly in the 200s but also some in the 300s in am. She was also on Glipizide 10 bid in the past. She had severe diarrhea from metformin before.  Pt checks her sugars 3x a day: - am:   136-186 >> 115-151 >> 111-139, 146 >> 142-166 >> 69, 72-146 - 2 hours after breakfast: 111-149 >> 126-148, 242 Christmas >> 126-140  >> n/c - before lunch:  153 >> 115-169 >> 120-131 >> 115-129 >> 136-177 >> 57, 72-144 - 2h after lunch: 121 >> 91, 114-142 >> 123-141 >> 138-171 >> 71-168 - before dinner: 162 >> 114-123 >> 131-150 >>  132-141 >> 64, 79-142 - after dinner: 115-171 >> n/c >> 128-142, 256 Christmas >> 133-194 >> 91-135 - Bedtime: 122-172 >> 188 >> 136-156, 252  Christmas >> 121, 149 >> 85-144 Lows: 91 >> 115  >> 21 >> 57, but feels low at night (not checking); she has hypoglycemia awareness at 60.  Highest sugar 188 >> 256 >> 194 x1 >> 193.  - No CKD. Last BUN/Cr: Lab Results  Component Value Date   BUN 16 08/07/2016   CREATININE 1.04 08/07/2016  On Losartan >> off . - Last Lipid panel: Lab Results  Component Value Date   CHOL 128 12/04/2015   HDL 41.80 12/04/2015   LDLCALC 64 12/04/2015   TRIG 113.0 12/04/2015   CHOLHDL 3 12/04/2015  She is not on a statin. - Pt's last eye exam was in 01/28/2016. 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 Has cataracts. Had cataract Sx OU in 2015. - Denies numbness and tingling in her legs - Dr. Angelia Mould, podiatry.   She also has a history of asthma, and is on frequent prednisone tapers, last around 02/2012; also HTN;  h/o nephrolithiasis, s/p lithotripsy; GERD.  She stopped metoprolol since last visit.  ROS: Constitutional: no weight gain/no weight loss, no fatigue, no subjective hyperthermia, no subjective hypothermia Eyes: no blurry vision, no xerophthalmia ENT: no sore throat, no nodules palpated in throat, no dysphagia, no odynophagia, no hoarseness Cardiovascular: no CP/no SOB/no palpitations/no leg swelling Respiratory: no cough/no SOB/no wheezing Gastrointestinal: no N/no V/no D/no C/no acid reflux Musculoskeletal: no muscle aches/no joint aches Skin: no rashes, no hair loss Neurological: no tremors/+ numbness/+ tingling/no dizziness  I reviewed pt's medications, allergies, PMH, social hx, family hx, and changes were documented in the history of present illness. Otherwise, unchanged from my initial visit note.  PE: BP (!) 172/112   Pulse 90   Ht 5' 1.5" (1.562 m)   Wt 146 lb (66.2 kg)   SpO2 (!) 86%   BMI 27.14 kg/m  Body mass index is 27.14 kg/m. Wt Readings from Last 3 Encounters:  11/19/16 146 lb (66.2 kg)  08/07/16 147 lb (66.7 kg)  05/22/16 147 lb 4 oz (66.8 kg)   Constitutional: normal weight, in NAD Eyes: PERRLA, EOMI, no  exophthalmos ENT: moist mucous membranes, no thyromegaly, no cervical lymphadenopathy Cardiovascular: tachycardia RR, No MRG Respiratory: CTA B Gastrointestinal: abdomen soft, NT, ND, BS+ Musculoskeletal: no deformities, strength intact in all 4 Skin: moist, warm, no rashes Neurological: no tremor with outstretched hands, DTR normal in all 4  ASSESSMENT: 1. DM2, insulin-dependent, uncontrolled, with complications - DR - PN She got a rash + itching from Toujeo.  PLAN:  1. Patient with uncontrolled DM, now with [facility]MUCH better control after she improved her diet and takes insulin as advised. She is having low CBGs at night (feels them, not checking sugars >> advised to check) >> will reduce Tresiba dose. - will continue Novolog at same doses - she is interested in a [employer] >> needs to sign up with a supplier >> given list  - I advised her to:  Patient Instructions  Please decrease: - Tresiba to 38 units at night   Please continue: - Novolog 12-15 units before each meal  KEEP UP THE GOOD JOB!  Please restart Norvasc 5 mg daily.  Please come back for a follow-up appointment in 3 months.  - today, HbA1c is 7.7% (better) - continue checking sugars at different times of the day - check 3x a day, rotating checks - advised for yearly eye exams >> she is UTD - + flu shot today - Return to clinic in 3 mo with sugar log   2. HTN - BP very high today - she is off Hyzaar b/c dizziness and also off Norvasc (not clear why stopped) - advised to restart Norvasc - she has a coming up appt with PCP >> will see if she needs to add maybe half of her Hyzaar dose at that time  Carlus Pavlovristina Kayle Correa, MD PhD [facility]

## 2016-11-19 NOTE — Patient Instructions (Addendum)
Please decrease: - Tresiba to 38 units at night   Please continue: - Novolog 12-15 units before each meal  KEEP UP THE GOOD JOB!  Please restart Norvasc 5 mg daily.  Please come back for a follow-up appointment in 3 months.

## 2016-11-25 ENCOUNTER — Other Ambulatory Visit: Payer: Self-pay | Admitting: Internal Medicine

## 2016-12-22 ENCOUNTER — Telehealth: Payer: Self-pay | Admitting: Internal Medicine

## 2016-12-22 NOTE — Telephone Encounter (Signed)
Will be on the lookout for [employer] forms.

## 2016-12-22 NOTE — Telephone Encounter (Signed)
Pt called states [employer] forms are being sent to provider to be filled out within 14 days. Pt request confirmation call pt complete.

## 2017-01-04 ENCOUNTER — Other Ambulatory Visit: Payer: Self-pay | Admitting: Internal Medicine

## 2017-01-06 ENCOUNTER — Other Ambulatory Visit: Payer: Self-pay | Admitting: Internal Medicine

## 2017-02-02 DIAGNOSIS — H3581 Retinal edema: Secondary | ICD-10-CM | POA: Diagnosis not present

## 2017-02-02 DIAGNOSIS — H40023 Open angle with borderline findings, high risk, bilateral: Secondary | ICD-10-CM | POA: Diagnosis not present

## 2017-02-02 DIAGNOSIS — E113293 Type 2 diabetes mellitus with mild nonproliferative diabetic retinopathy without macular edema, bilateral: Secondary | ICD-10-CM | POA: Diagnosis not present

## 2017-02-02 DIAGNOSIS — E1165 Type 2 diabetes mellitus with hyperglycemia: Secondary | ICD-10-CM | POA: Diagnosis not present

## 2017-02-02 LAB — HM DIABETES EYE EXAM

## 2017-02-09 ENCOUNTER — Encounter: Payer: Self-pay | Admitting: Family Medicine

## 2017-02-09 ENCOUNTER — Other Ambulatory Visit: Payer: Self-pay | Admitting: Adult Health

## 2017-02-09 DIAGNOSIS — Z139 Encounter for screening, unspecified: Secondary | ICD-10-CM

## 2017-02-15 ENCOUNTER — Other Ambulatory Visit: Payer: Self-pay | Admitting: Internal Medicine

## 2017-02-17 ENCOUNTER — Other Ambulatory Visit: Payer: Self-pay | Admitting: Internal Medicine

## 2017-03-03 ENCOUNTER — Encounter: Payer: Self-pay | Admitting: Adult Health

## 2017-03-03 ENCOUNTER — Ambulatory Visit (INDEPENDENT_AMBULATORY_CARE_PROVIDER_SITE_OTHER): Payer: Medicare HMO | Admitting: Adult Health

## 2017-03-03 VITALS — BP 160/94 | Temp 98.7°F | Ht 60.5 in | Wt 148.0 lb

## 2017-03-03 DIAGNOSIS — I10 Essential (primary) hypertension: Secondary | ICD-10-CM | POA: Diagnosis not present

## 2017-03-03 DIAGNOSIS — Z0000 Encounter for general adult medical examination without abnormal findings: Secondary | ICD-10-CM | POA: Diagnosis not present

## 2017-03-03 DIAGNOSIS — E1165 Type 2 diabetes mellitus with hyperglycemia: Secondary | ICD-10-CM

## 2017-03-03 DIAGNOSIS — E785 Hyperlipidemia, unspecified: Secondary | ICD-10-CM

## 2017-03-03 DIAGNOSIS — Z794 Long term (current) use of insulin: Secondary | ICD-10-CM

## 2017-03-03 LAB — CBC WITH DIFFERENTIAL/PLATELET
BASOS ABS: 0 10*3/uL (ref 0.0–0.1)
Basophils Relative: 0.4 % (ref 0.0–3.0)
EOS PCT: 0.7 % (ref 0.0–5.0)
Eosinophils Absolute: 0 10*3/uL (ref 0.0–0.7)
HEMATOCRIT: 40.9 % (ref 36.0–46.0)
HEMOGLOBIN: 13.1 g/dL (ref 12.0–15.0)
LYMPHS PCT: 28.1 % (ref 12.0–46.0)
Lymphs Abs: 1.9 10*3/uL (ref 0.7–4.0)
MCHC: 32.1 g/dL (ref 30.0–36.0)
MCV: 83.4 fl (ref 78.0–100.0)
MONOS PCT: 6.3 % (ref 3.0–12.0)
Monocytes Absolute: 0.4 10*3/uL (ref 0.1–1.0)
Neutro Abs: 4.3 10*3/uL (ref 1.4–7.7)
Neutrophils Relative %: 64.5 % (ref 43.0–77.0)
Platelets: 260 10*3/uL (ref 150.0–400.0)
RBC: 4.9 Mil/uL (ref 3.87–5.11)
RDW: 16 % — ABNORMAL HIGH (ref 11.5–15.5)
WBC: 6.7 10*3/uL (ref 4.0–10.5)

## 2017-03-03 LAB — LIPID PANEL
CHOL/HDL RATIO: 3
Cholesterol: 126 mg/dL (ref 0–200)
HDL: 42 mg/dL (ref >39.00)
LDL Cholesterol: 69 mg/dL (ref 0–99)
NONHDL: 83.78
TRIGLYCERIDES: 76 mg/dL (ref 0.0–149.0)
VLDL: 15.2 mg/dL (ref 0.0–40.0)

## 2017-03-03 LAB — BASIC METABOLIC PANEL
BUN: 17 mg/dL (ref 6–23)
CALCIUM: 9.6 mg/dL (ref 8.4–10.5)
CHLORIDE: 106 meq/L (ref 96–112)
CO2: 27 meq/L (ref 19–32)
CREATININE: 0.84 mg/dL (ref 0.40–1.20)
GFR: 84.78 mL/min (ref >60.00)
GLUCOSE: 100 mg/dL — AB (ref 70–99)
Potassium: 4.1 mEq/L (ref 3.5–5.1)
Sodium: 140 mEq/L (ref 135–145)

## 2017-03-03 LAB — HEPATIC FUNCTION PANEL
ALBUMIN: 4.2 g/dL (ref 3.5–5.2)
ALT: 12 U/L (ref 0–35)
AST: 15 U/L (ref 0–37)
Alkaline Phosphatase: 86 U/L (ref 39–117)
Bilirubin, Direct: 0.1 mg/dL (ref 0.0–0.3)
TOTAL PROTEIN: 7.8 g/dL (ref 6.0–8.3)
Total Bilirubin: 0.6 mg/dL (ref 0.2–1.2)

## 2017-03-03 LAB — TSH: TSH: 0.95 u[IU]/mL (ref 0.35–4.50)

## 2017-03-03 NOTE — Progress Notes (Signed)
Subjective:    Patient ID: Katrina Garrison, female    DOB: 08-31-1940, 77 y.o.   MRN: [id number]  HPI  Patient presents for yearly preventative medicine examination. She is a pleasant 77 year old female who  has a past medical history of Anemia, Diabetes mellitus type II (2005), Diverticulosis, GERD (gastroesophageal reflux disease), History of lithotripsy, Hypertension, and Kidney stones.  She has a history of hypertension and has been prescribed Norvasc 5 mg and Hyzaar 100-25 mg. She is not currently taking any medication as she was concerned due to recall.   Diabetes is managed and followed by Dr. Elvera Lennox with endocrinology. Last visit was in October at which time her A1c had improved to 7.7. She plans on following up in March.   She has a history of hyperlipidemia but does not currently take any medications  All immunizations and health maintenance protocols were reviewed with the patient and needed orders were placed. She is up to date on her vaccinations.   Appropriate screening laboratory values were ordered for the patient including screening of hyperlipidemia, renal function and hepatic function.  Medication reconciliation,  past medical history, social history, problem list and allergies were reviewed in detail with the patient  Goals were established with regard to weight loss, exercise, and  diet in compliance with medications. She has not been exercising due to a lot of travel. She is drinking " a lot more water". Denies any changes in diet   Wt Readings from Last 3 Encounters:  03/03/17 148 lb (67.1 kg)  11/19/16 146 lb (66.2 kg)  08/07/16 147 lb (66.7 kg)   End of life planning was discussed. She has an advanced directive and living will.  She has had her diabetic eye exam and is up to date on her colonoscopy. She has her mammogram scheduled for tomorrow.   She has no acute complaints. States " I feel really good! I am having a lot of fun with my life."   Review of  Systems  Constitutional: Negative.   HENT: Negative.   Eyes: Negative.   Respiratory: Negative.   Cardiovascular: Negative.   Gastrointestinal: Negative.   Endocrine: Negative.   Genitourinary: Negative.   Musculoskeletal: Negative.   Skin: Negative.   Allergic/Immunologic: Negative.   Neurological: Negative.   Hematological: Negative.   Psychiatric/Behavioral: Negative.       Past Medical History:  Diagnosis Date  . Anemia   . Diabetes mellitus type II 2005  . Diverticulosis   . GERD (gastroesophageal reflux disease)   . History of lithotripsy    x3 for kidney stones  . Hypertension   . Kidney stones     Social History   Socioeconomic History  . Marital status: Divorced    Spouse name: Not on file  . Number of children: Not on file  . Years of education: Not on file  . Highest education level: Not on file  Social Needs  . Financial resource strain: Not on file  . Food insecurity - worry: Not on file  . Food insecurity - inability: Not on file  . Transportation needs - medical: Not on file  . Transportation needs - non-medical: Not on file  Occupational History  . Occupation: Retired  Tobacco Use  . Smoking status: Former Smoker    Years: 1.50    Last attempt to quit: 01/28/1985    Years since quitting: 32.1  . Smokeless tobacco: Never Used  Substance and Sexual Activity  . Alcohol use: No  .  Drug use: No  . Sexual activity: Not on file  Other Topics Concern  . Not on file  Social History Narrative  . Not on file    Past Surgical History:  Procedure Laterality Date  . ABDOMINAL HYSTERECTOMY    . childbirth x 2    . EYE SURGERY     cataract    Family History  Problem Relation Age of Onset  . Thyroid cancer Mother   . Diabetes Mother   . Hypertension Mother   . Alcohol abuse Father   . Hypertension Father   . Multiple sclerosis Daughter   . Multiple sclerosis Daughter   . Multiple sclerosis Sister     Allergies  Allergen Reactions  .  Lisinopril Cough  . Metformin And Related Diarrhea  . Metoprolol Swelling    Leg cramping   . Sulfamethoxazole     REACTION: "comatose"  . Toujeo Solostar [Insulin Glargine] Rash    Current Outpatient Medications on File Prior to Visit  Medication Sig Dispense Refill  . aspirin 81 MG tablet Take 81 mg by mouth daily.    Marland Kitchen ibuprofen (ADVIL,MOTRIN) 200 MG tablet Take 200 mg by mouth every 6 (six) hours as needed. Reported on 03/22/2015    . insulin aspart (NOVOLOG) 100 UNIT/ML FlexPen Inject 12-15 Units into the skin 3 (three) times daily before meals. 15 mL 11  . Liniments (BIOFLEXOR) 3 % GEL Apply topically.    Marland Kitchen RELION PEN NEEDLES 31G X 6 MM MISC USE AS DIRECTED THREE TIMES DAILY 100 each 3  . TRESIBA FLEXTOUCH 100 UNIT/ML SOPN FlexTouch Pen INJECT 38 UNITS SUBCUTANEOUSLY AT BEDTIME 15 pen 0   Current Facility-Administered Medications on File Prior to Visit  Medication Dose Route Frequency Provider Last Rate Last Dose  . methylPREDNISolone acetate (DEPO-MEDROL) injection 80 mg  80 mg Intramuscular Once Finleigh Cheong, NP        BP (!) 160/94 (BP Location: Left Arm)   Temp 98.7 F (37.1 C) (Oral)   Ht 5' 0.5" (1.537 m)   Wt 148 lb (67.1 kg)   BMI 28.43 kg/m    I reviewed pt's medications, allergies, PMH, social hx, family hx, and changes were documented in the history of present illness. Otherwise, unchanged from my initial visit note.       Objective:   Physical Exam  Constitutional: She is oriented to person, place, and time. She appears well-developed and well-nourished. No distress.  HENT:  Head: Normocephalic and atraumatic.  Right Ear: External ear normal.  Left Ear: External ear normal.  Nose: Nose normal.  Mouth/Throat: Oropharynx is clear and moist. No oropharyngeal exudate.  Eyes: Conjunctivae and EOM are normal. Right eye exhibits no discharge. Left eye exhibits no discharge. No scleral icterus.  Neck: Normal range of motion. Neck supple. No JVD present. No  tracheal deviation present. No thyromegaly present.  Cardiovascular: Normal rate, regular rhythm, normal heart sounds and intact distal pulses. Exam reveals no gallop and no friction rub.  No murmur heard. Pulmonary/Chest: Effort normal and breath sounds normal. No stridor. No respiratory distress. She has no wheezes. She has no rales. She exhibits no tenderness.  Abdominal: Soft. Bowel sounds are normal. She exhibits no distension and no mass. There is no tenderness. There is no rebound and no guarding.  Genitourinary:  Genitourinary Comments: Refused   Musculoskeletal: Normal range of motion. She exhibits no edema, tenderness or deformity.  Lymphadenopathy:    She has no cervical adenopathy.  Neurological: She is alert  and oriented to person, place, and time. She has normal reflexes. She displays normal reflexes. No cranial nerve deficit. She exhibits normal muscle tone. Coordination normal.  Skin: Skin is warm and dry. No rash noted. She is not diaphoretic. No erythema. No pallor.  Psychiatric: She has a normal mood and affect. Her behavior is normal. Judgment and thought content normal.  Nursing note and vitals reviewed.     Assessment & Plan:  1. Routine general medical examination at a health care facility - encouraged diabetic diet and plenty of exercise  - Follow up in one year or sooner if needed - CBC with Differential/Platelet - Basic metabolic panel - Hepatic function panel - Lipid panel - TSH  2. Essential hypertension - Restart medications - CBC with Differential/Platelet - Basic metabolic panel - Hepatic function panel - Lipid panel - TSH  3. Uncontrolled type 2 diabetes mellitus with hyperglycemia, with long-term current use of insulin (HCC) - Will not check A1c today, I know that if I do she will not follow up with Endocrinology. Advised she was past due for an appointment  - CBC with Differential/Platelet - Basic metabolic panel - Hepatic function panel -  Lipid panel - TSH  4. Hyperlipidemia, unspecified hyperlipidemia type - Consider statin  - CBC with Differential/Platelet - Basic metabolic panel - Hepatic function panel - Lipid panel - TSH  Shirline Freesory Lovetta Condie, NP

## 2017-03-04 ENCOUNTER — Encounter: Payer: Self-pay | Admitting: Family Medicine

## 2017-03-04 ENCOUNTER — Ambulatory Visit
Admission: RE | Admit: 2017-03-04 | Discharge: 2017-03-04 | Disposition: A | Discharge status: Home / Self Care | Payer: Medicare HMO | Source: Ambulatory Visit | Attending: Adult Health | Admitting: Adult Health

## 2017-03-04 DIAGNOSIS — Z139 Encounter for screening, unspecified: Secondary | ICD-10-CM

## 2017-03-04 DIAGNOSIS — Z1231 Encounter for screening mammogram for malignant neoplasm of breast: Secondary | ICD-10-CM | POA: Diagnosis not present

## 2017-04-13 DIAGNOSIS — Z87891 Personal history of nicotine dependence: Secondary | ICD-10-CM | POA: Diagnosis not present

## 2017-04-13 DIAGNOSIS — R69 Illness, unspecified: Secondary | ICD-10-CM | POA: Diagnosis not present

## 2017-04-13 DIAGNOSIS — E1165 Type 2 diabetes mellitus with hyperglycemia: Secondary | ICD-10-CM | POA: Diagnosis not present

## 2017-04-13 DIAGNOSIS — Z809 Family history of malignant neoplasm, unspecified: Secondary | ICD-10-CM | POA: Diagnosis not present

## 2017-04-13 DIAGNOSIS — Z794 Long term (current) use of insulin: Secondary | ICD-10-CM | POA: Diagnosis not present

## 2017-04-13 DIAGNOSIS — Z8249 Family history of ischemic heart disease and other diseases of the circulatory system: Secondary | ICD-10-CM | POA: Diagnosis not present

## 2017-04-13 DIAGNOSIS — E1142 Type 2 diabetes mellitus with diabetic polyneuropathy: Secondary | ICD-10-CM | POA: Diagnosis not present

## 2017-04-13 DIAGNOSIS — Z823 Family history of stroke: Secondary | ICD-10-CM | POA: Diagnosis not present

## 2017-04-13 DIAGNOSIS — Z833 Family history of diabetes mellitus: Secondary | ICD-10-CM | POA: Diagnosis not present

## 2017-04-13 DIAGNOSIS — I10 Essential (primary) hypertension: Secondary | ICD-10-CM | POA: Diagnosis not present

## 2017-06-08 ENCOUNTER — Other Ambulatory Visit: Payer: Self-pay | Admitting: Internal Medicine

## 2017-06-10 DIAGNOSIS — R69 Illness, unspecified: Secondary | ICD-10-CM | POA: Diagnosis not present

## 2017-07-16 DIAGNOSIS — E1351 Other specified diabetes mellitus with diabetic peripheral angiopathy without gangrene: Secondary | ICD-10-CM | POA: Diagnosis not present

## 2017-07-16 DIAGNOSIS — M79671 Pain in right foot: Secondary | ICD-10-CM | POA: Diagnosis not present

## 2017-07-16 DIAGNOSIS — M79672 Pain in left foot: Secondary | ICD-10-CM | POA: Diagnosis not present

## 2017-08-17 ENCOUNTER — Ambulatory Visit: Payer: Medicare HMO | Admitting: Internal Medicine

## 2017-08-24 DIAGNOSIS — H04123 Dry eye syndrome of bilateral lacrimal glands: Secondary | ICD-10-CM | POA: Diagnosis not present

## 2017-08-24 DIAGNOSIS — H40023 Open angle with borderline findings, high risk, bilateral: Secondary | ICD-10-CM | POA: Diagnosis not present

## 2017-08-25 ENCOUNTER — Other Ambulatory Visit: Payer: Self-pay | Admitting: Internal Medicine

## 2017-10-25 ENCOUNTER — Encounter: Payer: Self-pay | Admitting: Internal Medicine

## 2017-10-25 ENCOUNTER — Ambulatory Visit (INDEPENDENT_AMBULATORY_CARE_PROVIDER_SITE_OTHER): Payer: Medicare HMO | Admitting: Internal Medicine

## 2017-10-25 VITALS — BP 170/90 | HR 100 | Ht 60.5 in | Wt 142.0 lb

## 2017-10-25 DIAGNOSIS — E785 Hyperlipidemia, unspecified: Secondary | ICD-10-CM | POA: Diagnosis not present

## 2017-10-25 DIAGNOSIS — Z794 Long term (current) use of insulin: Secondary | ICD-10-CM

## 2017-10-25 DIAGNOSIS — E1165 Type 2 diabetes mellitus with hyperglycemia: Secondary | ICD-10-CM

## 2017-10-25 DIAGNOSIS — Z23 Encounter for immunization: Secondary | ICD-10-CM

## 2017-10-25 LAB — POCT GLYCOSYLATED HEMOGLOBIN (HGB A1C): Hemoglobin A1C: 11 % — AB (ref 4.0–5.6)

## 2017-10-25 LAB — GLUCOSE, POCT (MANUAL RESULT ENTRY): POC GLUCOSE: 378 mg/dL — AB (ref 70–99)

## 2017-10-25 MED ORDER — INSULIN GLARGINE 100 UNIT/ML SOLOSTAR PEN: 30.0000 [IU] | PEN_INJECTOR | Freq: Every day | SUBCUTANEOUS | 11 refills | Status: DC

## 2017-10-25 MED ORDER — INSULIN ASPART 100 UNIT/ML FLEXPEN: PEN_INJECTOR | SUBCUTANEOUS | 11 refills | Status: DC

## 2017-10-25 NOTE — Addendum Note (Signed)
Addended by: Darliss Ridgel I on: 10/25/2017 12:52 PM   Modules accepted: Orders

## 2017-10-25 NOTE — Patient Instructions (Addendum)
Please continue - Novolog 12-15 units before each meal  Please start: - Lantus 15 units at bedtime and increase to 30 units at bedtime  Please let me know if the sugars do not decrease in 2 weeks.  Start back on Norvasc 5 mg daily.  Please come back for a follow-up appointment in 3 months.

## 2017-10-25 NOTE — Progress Notes (Signed)
Patient ID: Katrina Garrison, female   DOB: 1940/08/04, 77 y.o.   MRN: [id number]  HPI: Katrina Garrison is a 77 y.o.-year-old female, returning for f/u for DM2, dx 2009, insulin-dependent since 2012-13, uncontrolled, with complications (DR, + hospitalization for diabetic coma x 2, PN). Last visit almost a year ago. She is not usually compliant with her appointments and the recommended treatments. She has [employer].  She is off [place] since last visit as she was waking up feeling very tired in the morning.  No low blood sugars documented.  Per review of her log at home, sugars are at or very close to goal at all times of the day.  She brings a detailed log for the last 4 weeks  Latest hemoglobin A1c: Lab Results  Component Value Date   HGBA1C 7.7 11/19/2016   HGBA1C 8.4 05/22/2016   HGBA1C 10.5 01/22/2016  Anti-islet cell and anti-GAD antibodies were undetectable.  Patient is on the following regimen: - Basaglar 45 units daily at bedtime >> - Humalog 12-15 units before meals >> Novolog 12 to 15 units before meals She got a rash + itching from Toujeo. Stopped Metformin in 11/2013 for bloody stools. She had severe diarrhea from metformin before. Stopped Januvia 100 mg daily >> b/c CP. Pt was in the past on a regimen of Humalog 75/25 pen - 50 units daily once a day - but was forgetting the insulin 1/7 days. She was taking the insulin at bedtime >> lows at night and highs, mostly in the 200s but also some in the 300s in am. She was also on Glipizide 10 bid in the past.  Pt checks her sugars several times a day (freestyle libre CGM was not covered by insurance): - am:  142-166 >> 69, 72-146 >> 79-138 - 2 hours after breakfast:126-148, 242>> 126-140  >> n/c >> 73-151 - before lunch: 136-177 >> 57, 72-144 >> 99--143 - 2h after lunch: 138-171 >> 71-168 >> 72-133 - before dinner: 132-141 >> 64, 79-142 >> 72-142 - after dinner: 133-194 >> 91-135 >> 102-141 - Bedtime: 121, 149 >> 85-144  >> 101-150 Lows:57, but feels low at night (not checking) >> 72; she has hypoglycemia awareness in the 60s Highest sugar 193 >> 151.  -No CKD. Last BUN/Cr: Lab Results  Component Value Date   BUN 17 03/03/2017   CREATININE 0.84 03/03/2017  Of losartan - Last Lipid panel: Lab Results  Component Value Date   CHOL 126 03/03/2017   HDL 42.00 03/03/2017   LDLCALC 69 03/03/2017   TRIG 76.0 03/03/2017   CHOLHDL 3 03/03/2017  Not on a statin. - Pt's last eye exam was in 2018: + DR, no macular edema. Has cataracts. Had cataract Sx OU in 2015. - Denies numbness and tingling in her legs - Dr. Angelia Mould, podiatry.   She also has a history of asthma, and is on frequent prednisone tapers, last around 02/2012; also HTN;  h/o nephrolithiasis, s/p lithotripsy; GERD.  She stopped metoprolol since last visit.  ROS: Constitutional: no weight gain/no weight loss, + fatigue, + subjective hyperthermia, + subjective hypothermia Eyes: no blurry vision, no xerophthalmia ENT: no sore throat, no nodules palpated in throat, no dysphagia, no odynophagia, no hoarseness Cardiovascular: no CP/no SOB/no palpitations/+ leg swelling Respiratory: + Cough/no SOB/no wheezing Gastrointestinal: no N/no V/+ occasional D/no C/no acid reflux Musculoskeletal: no muscle aches/+ joint aches Skin: + Rash, no hair loss Neurological: no tremors/no numbness/no tingling/no dizziness  I reviewed pt's medications, allergies, PMH, social hx, family  hx, and changes were documented in the history of present illness. Otherwise, unchanged from my initial visit note.  Past Medical History:  Diagnosis Date  . Anemia   . Diabetes mellitus type II 2005  . Diverticulosis   . GERD (gastroesophageal reflux disease)   . History of lithotripsy    x3 for kidney stones  . Hypertension   . Kidney stones    Past Surgical History:  Procedure Laterality Date  . ABDOMINAL HYSTERECTOMY    . childbirth x 2    . EYE SURGERY     cataract    Social History   Socioeconomic History  . Marital status: Divorced    Spouse name: Not on file  . Number of children: Not on file  . Years of education: Not on file  . Highest education level: Not on file  Occupational History  . Occupation: Retired  Engineer, production  . Financial resource strain: Not on file  . Food insecurity:    Worry: Not on file    Inability: Not on file  . Transportation needs:    Medical: Not on file    Non-medical: Not on file  Tobacco Use  . Smoking status: Former Smoker    Years: 1.50    Last attempt to quit: 01/28/1985    Years since quitting: 32.7  . Smokeless tobacco: Never Used  Substance and Sexual Activity  . Alcohol use: No  . Drug use: No  . Sexual activity: Not on file  Lifestyle  . Physical activity:    Days per week: Not on file    Minutes per session: Not on file  . Stress: Not on file  Relationships  . Social connections:    Talks on phone: Not on file    Gets together: Not on file    Attends religious service: Not on file    Active member of club or organization: Not on file    Attends meetings of clubs or organizations: Not on file    Relationship status: Not on file  . Intimate partner violence:    Fear of current or ex partner: Not on file    Emotionally abused: Not on file    Physically abused: Not on file    Forced sexual activity: Not on file  Other Topics Concern  . Not on file  Social History Narrative  . Not on file   Current Outpatient Medications on File Prior to Visit  Medication Sig Dispense Refill  . aspirin 81 MG tablet Take 81 mg by mouth daily.    Marland Kitchen ibuprofen (ADVIL,MOTRIN) 200 MG tablet Take 200 mg by mouth every 6 (six) hours as needed. Reported on 03/22/2015    . Liniments (BIOFLEXOR) 3 % GEL Apply topically.    Marland Kitchen NOVOLOG FLEXPEN 100 UNIT/ML FlexPen INJECT 12-15 UNITS SUBCUTANEOUSLY THREE TIMES DAILY WITH MEALS 15 pen 0  . RELION PEN NEEDLES 31G X 6 MM MISC USE 1  THREE TIMES DAILY 100 each 1  . TRESIBA  FLEXTOUCH 100 UNIT/ML SOPN FlexTouch Pen INJECT 38 UNITS SUBCUTANEOUSLY AT BEDTIME 15 pen 0   Current Facility-Administered Medications on File Prior to Visit  Medication Dose Route Frequency Provider Last Rate Last Dose  . methylPREDNISolone acetate (DEPO-MEDROL) injection 80 mg  80 mg Intramuscular Once [name], NP       Allergies  Allergen Reactions  . Lisinopril Cough  . Metformin And Related Diarrhea  . Metoprolol Swelling    Leg cramping   . Sulfamethoxazole  REACTION: "comatose"  . Toujeo Solostar [Insulin Glargine] Rash   Family History  Problem Relation Age of Onset  . Thyroid cancer Mother   . Diabetes Mother   . Hypertension Mother   . Alcohol abuse Father   . Hypertension Father   . Multiple sclerosis Daughter   . Multiple sclerosis Daughter   . Multiple sclerosis Sister     PE: BP (!) 170/90   Pulse 100   Ht 5' 0.5" (1.537 m) Comment: measured  Wt 142 lb (64.4 kg)   BMI 27.28 kg/m  Body mass index is 27.28 kg/m. Wt Readings from Last 3 Encounters:  10/25/17 142 lb (64.4 kg)  03/03/17 148 lb (67.1 kg)  11/19/16 146 lb (66.2 kg)   Constitutional: Normal weight, in NAD Eyes: PERRLA, EOMI, no exophthalmos ENT: moist mucous membranes, no thyromegaly, no cervical lymphadenopathy Cardiovascular: Tachycardia, RR, No MRG Respiratory: CTA B Gastrointestinal: abdomen soft, NT, ND, BS+ Musculoskeletal: no deformities, strength intact in all 4 Skin: moist, warm, no rashes Neurological: no tremor with outstretched hands, DTR normal in all 4  ASSESSMENT: 1. DM2, insulin-dependent, uncontrolled, with complications - DR - PN  2. HTN  PLAN:  1. Patient with uncontrolled DM, returning after another long absence.  At this visit, she is telling me that she stopped [place] long time ago she was waking up tired in the morning.  No low blood sugars.  She continues only on NovoLog.  At this visit, she brings her sugar log for the last 4 weeks and her sugars  are at or close to goal at all times of the day.  However, today, HbA1c is 11% (much higher).  We checked her CBG with our meter and her sugar was 378.  At home, her sugars are all lower than 150.  Therefore, I advised her that either her meter or the test strips are defective.  I advised her to get the [employer] meter from [place].  Based on the CBG here in the office in the very high HbA1c, we decided to start long-acting insulin and will try Lantus.  Will not use Tresiba anymore.  I advised her to start at the lower dose and advance as tolerated and as needed.  I also advised her to contact me with her sugars in 2 weeks to make further adjustments. - We will continue the current doses of NovoLog - a Freestyle Libre CGM was not covered - I advised her to:  Patient Instructions  Please continue - Novolog 12-15 units before each meal  Please start: - Lantus 15 units at bedtime and increase to 30 units at bedtime  Please let me know if the sugars do not decrease in 2 weeks.  Start back on Norvasc 5 mg daily.  Please come back for a follow-up appointment in 3 months.  - continue checking sugars at different times of the day - check 3x a day, rotating checks - advised for yearly eye exams >> she is not UTD - Given flu shot today - Return to clinic in 3 mo with sugar log   2. HTN -Blood pressure very high today -At last visit, I advised her to restart Norvasc.  She is not taking this.  I strongly advised him to restart. -Discussed about possible consequences of high blood pressure including stroke heart attack  Carlus Pavlov, MD PhD [facility]

## 2017-10-26 ENCOUNTER — Telehealth: Payer: Self-pay

## 2017-10-26 ENCOUNTER — Other Ambulatory Visit: Payer: Self-pay | Admitting: Internal Medicine

## 2017-10-26 MED ORDER — INSULIN DETEMIR 100 UNIT/ML FLEXPEN: 30.0000 [IU] | PEN_INJECTOR | Freq: Every day | SUBCUTANEOUS | 11 refills | Status: DC

## 2017-10-26 NOTE — Telephone Encounter (Signed)
Note sent back. 

## 2017-10-26 NOTE — Telephone Encounter (Signed)
Let's send Levemir - same dose.  She could not tolerate [place].

## 2017-10-26 NOTE — Telephone Encounter (Signed)
New RX sent.  Patient notified.

## 2017-10-26 NOTE — Telephone Encounter (Signed)
Please advise 

## 2017-10-26 NOTE — Telephone Encounter (Signed)
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

## 2017-11-02 ENCOUNTER — Other Ambulatory Visit: Payer: Self-pay | Admitting: Adult Health

## 2017-11-02 DIAGNOSIS — I10 Essential (primary) hypertension: Secondary | ICD-10-CM

## 2017-11-04 NOTE — Telephone Encounter (Signed)
Sent to the pharmacy by e-scribe for 90 days. 

## 2017-11-15 ENCOUNTER — Telehealth: Payer: Self-pay | Admitting: Internal Medicine

## 2017-11-15 ENCOUNTER — Ambulatory Visit: Payer: Self-pay | Admitting: *Deleted

## 2017-11-15 NOTE — Telephone Encounter (Signed)
Ms. Lizer has been experiencing high blood pressure for 2 weeks now. Denies CP, SOB,HA, Weakness. She does report occasional blurred vision at times. Yesterday's B/P 158/86 and 150/84 HR 80's. Reports she takes norvasc 5 mg daily as prescribed. Stated she also takes Losartan HCTZ 100-25mg  daily however, it causes her to cough tremendously. Blood sugars also elevated in the 400's daily.She saw Dr. Elvera Lennox on 10/01 and Levimir 30 units daily was added to her medications. She has not seen a decrease with her sugars since that visit. Encouraged her to call Endocrinologist with that information. Reviewed care advice with patient. Appointment made for tomorrow at 7:30am.   Reason for Disposition . [1] Taking BP medications AND [2] feels is having side effects (e.g., impotence, cough, dizzy upon standing)  Answer Assessment - Initial Assessment Questions 1. BLOOD PRESSURE: "What is the blood pressure?" "Did you take at least two measurements 5 minutes apart?"    158/86 yesterday  cbg 438 at noon. 8pm cbg 281, 150 84 HR 82 2. ONSET: "When did you take your blood pressure?"     yesterday 3. HOW: "How did you obtain the blood pressure?" (e.g., visiting nurse, automatic home BP monitor)     Home b/p monitor 4. HISTORY: "Do you have a history of high blood pressure?"     yes 5. MEDICATIONS: "Are you taking any medications for blood pressure?" "Have you missed any doses recently?"     yes 6. OTHER SYMPTOMS: "Do you have any symptoms?" (e.g., headache, chest pain, blurred vision, difficulty breathing, weakness)     Blurred vision occasionally, nothing else. 7. PREGNANCY: "Is there any chance you are pregnant?" "When was your last menstrual period?"     no  Protocols used: HIGH BLOOD PRESSURE-A-AH

## 2017-11-15 NOTE — Telephone Encounter (Signed)
Patient stated that her PCP is wanting the patient to reach out to our office due to her high BP and BS. She is going in to see her PCP tomorrow morning for this. They wanted the patient to ask about coming off of her Levemir to help with this.  Please advise     Insulin Detemir (LEVEMIR FLEXTOUCH) 100 UNIT/ML Pen

## 2017-11-15 NOTE — Telephone Encounter (Signed)
I will let her address the blood pressure issue with PCP. Regarding the blood sugars, I would not stop Levemir!  I am not sure why she is thinking about doing so... She may need to increase the dose beyond 30 units which I believe she is taking now.  It is okay to go up to 40 units if needed, however, I would make sure that she is taking the NovoLog as prescribed before increasing the dose of Levemir.   If she absolutely wants to come off Levemir, we can try Basaglar instead which should be covered by her insurance.  Lantus was not covered.

## 2017-11-15 NOTE — Telephone Encounter (Signed)
Notified patient of message from Dr. Elvera Lennox, patient expressed understanding and agreement. No further questions.  Patient confirms taking her Novolog as Rx'd so she will increase Levamir to 40 unit.

## 2017-11-16 ENCOUNTER — Ambulatory Visit (INDEPENDENT_AMBULATORY_CARE_PROVIDER_SITE_OTHER): Payer: Medicare HMO | Admitting: Adult Health

## 2017-11-16 ENCOUNTER — Encounter: Payer: Self-pay | Admitting: Adult Health

## 2017-11-16 DIAGNOSIS — I10 Essential (primary) hypertension: Secondary | ICD-10-CM

## 2017-11-16 MED ORDER — HYDROCHLOROTHIAZIDE 12.5 MG PO CAPS: 12.5000 mg | ORAL_CAPSULE | Freq: Every day | ORAL | 0 refills | Status: DC

## 2017-11-16 MED ORDER — AMLODIPINE BESYLATE 10 MG PO TABS: 10.0000 mg | ORAL_TABLET | Freq: Every day | ORAL | 0 refills | Status: DC

## 2017-11-16 NOTE — Patient Instructions (Addendum)
It was great seeing you today   I have increased Norvasc from 5 mg to 10 mg daily   I have also added a medication called HCTZ - take this daily   Follow up with me in 2 weeks   You need to eat a low salt diet

## 2017-11-16 NOTE — Progress Notes (Signed)
Subjective:    Patient ID: Katrina Garrison, female    DOB: 11-21-1940, 77 y.o.   MRN: [id number]  HPI 77 year old female who  has a past medical history of Anemia, Diabetes mellitus type II (2005), Diverticulosis, GERD (gastroesophageal reflux disease), History of lithotripsy, Hypertension, and Kidney stones. Who presents to the office today for complaint of hypertension, this is a long-standing complaint and she has been extremely noncompliant in the past with taking medications.  During her last visit with Dr. Elvera Lennox on 10/25/2017 she was not taking prescribed Norvasc.  In the interim she stopped taking Hyzaar due to cough and metoprolol due to swelling/leg cramping   She reports that she has been diligent about taking Norvasc 5 mg daily.    Does report episodes of blurred vision   She has been monitoring her BP at home and has BP readings consistently in the 160-180's/ 70-90's/   Review of Systems See HPI   Past Medical History:  Diagnosis Date  . Anemia   . Diabetes mellitus type II 2005  . Diverticulosis   . GERD (gastroesophageal reflux disease)   . History of lithotripsy    x3 for kidney stones  . Hypertension   . Kidney stones     Social History   Socioeconomic History  . Marital status: Divorced    Spouse name: Not on file  . Number of children: Not on file  . Years of education: Not on file  . Highest education level: Not on file  Occupational History  . Occupation: Retired  Engineer, production  . Financial resource strain: Not on file  . Food insecurity:    Worry: Not on file    Inability: Not on file  . Transportation needs:    Medical: Not on file    Non-medical: Not on file  Tobacco Use  . Smoking status: Former Smoker    Years: 1.50    Last attempt to quit: 01/28/1985    Years since quitting: 32.8  . Smokeless tobacco: Never Used  Substance and Sexual Activity  . Alcohol use: No  . Drug use: No  . Sexual activity: Not on file  Lifestyle  . Physical  activity:    Days per week: Not on file    Minutes per session: Not on file  . Stress: Not on file  Relationships  . Social connections:    Talks on phone: Not on file    Gets together: Not on file    Attends religious service: Not on file    Active member of club or organization: Not on file    Attends meetings of clubs or organizations: Not on file    Relationship status: Not on file  . Intimate partner violence:    Fear of current or ex partner: Not on file    Emotionally abused: Not on file    Physically abused: Not on file    Forced sexual activity: Not on file  Other Topics Concern  . Not on file  Social History Narrative  . Not on file    Past Surgical History:  Procedure Laterality Date  . ABDOMINAL HYSTERECTOMY    . childbirth x 2    . EYE SURGERY     cataract    Family History  Problem Relation Age of Onset  . Thyroid cancer Mother   . Diabetes Mother   . Hypertension Mother   . Alcohol abuse Father   . Hypertension Father   . Multiple sclerosis  Daughter   . Multiple sclerosis Daughter   . Multiple sclerosis Sister     Allergies  Allergen Reactions  . Lisinopril Cough  . Metformin And Related Diarrhea  . Metoprolol Swelling    Leg cramping   . Sulfamethoxazole     REACTION: "comatose"  . Toujeo Solostar [Insulin Glargine] Rash    Current Outpatient Medications on File Prior to Visit  Medication Sig Dispense Refill  . amLODipine (NORVASC) 5 MG tablet TAKE 1 TABLET BY MOUTH ONCE DAILY 90 tablet 0  . aspirin 81 MG tablet Take 81 mg by mouth daily.    Marland Kitchen ibuprofen (ADVIL,MOTRIN) 200 MG tablet Take 200 mg by mouth every 6 (six) hours as needed. Reported on 03/22/2015    . insulin aspart (NOVOLOG FLEXPEN) 100 UNIT/ML FlexPen Inject 12 to 15 units before every meal, on the skin 15 pen 11  . Insulin Detemir (LEVEMIR FLEXTOUCH) 100 UNIT/ML Pen Inject 30 Units into the skin daily. 5 pen 11  . Liniments (BIOFLEXOR) 3 % GEL Apply topically.    Marland Kitchen RELION PEN  NEEDLES 31G X 6 MM MISC USE 1  THREE TIMES DAILY 100 each 1   Current Facility-Administered Medications on File Prior to Visit  Medication Dose Route Frequency Provider Last Rate Last Dose  . methylPREDNISolone acetate (DEPO-MEDROL) injection 80 mg  80 mg Intramuscular Once Ravenne Wayment, Kandee Keen, NP        There were no vitals taken for this visit.     Objective:   Physical Exam  Constitutional: She is oriented to person, place, and time. She appears well-developed and well-nourished. No distress.  Cardiovascular: Normal rate, regular rhythm, normal heart sounds and intact distal pulses.  Pulmonary/Chest: Effort normal and breath sounds normal.  Neurological: She is alert and oriented to person, place, and time.  Skin: Skin is warm and dry. Capillary refill takes less than 2 seconds. She is not diaphoretic.  Psychiatric: She has a normal mood and affect. Her behavior is normal. Judgment and thought content normal.  Nursing note and vitals reviewed.     Assessment & Plan:  1. Essential hypertension - Increase Norvasc from 5 mg to 10 mg. Add HCTZ.  - DASH diet  - Take medications daily  - Follow up in 2 weeks or sooner if needed - Bring log to next visit  - amLODipine (NORVASC) 10 MG tablet; Take 1 tablet (10 mg total) by mouth daily.  Dispense: 90 tablet; Refill: 0 - hydrochlorothiazide (MICROZIDE) 12.5 MG capsule; Take 1 capsule (12.5 mg total) by mouth daily.  Dispense: 30 capsule; Refill: 0  [name], NP

## 2017-12-09 ENCOUNTER — Ambulatory Visit: Payer: Medicare HMO | Admitting: Adult Health

## 2017-12-18 ENCOUNTER — Other Ambulatory Visit: Payer: Self-pay | Admitting: Adult Health

## 2017-12-18 DIAGNOSIS — I10 Essential (primary) hypertension: Secondary | ICD-10-CM

## 2017-12-21 NOTE — Telephone Encounter (Signed)
Sent to the pharmacy by e-scribe. 

## 2018-01-28 ENCOUNTER — Other Ambulatory Visit: Payer: Self-pay | Admitting: Adult Health

## 2018-01-28 ENCOUNTER — Telehealth: Payer: Self-pay | Admitting: Adult Health

## 2018-01-28 DIAGNOSIS — I10 Essential (primary) hypertension: Secondary | ICD-10-CM

## 2018-01-28 DIAGNOSIS — R69 Illness, unspecified: Secondary | ICD-10-CM | POA: Diagnosis not present

## 2018-01-28 MED ORDER — GLUCOSE BLOOD VI STRP: ORAL_STRIP | 12 refills | Status: DC

## 2018-01-28 NOTE — Telephone Encounter (Signed)
Sent to the pharmacy by e-scribe for 30 days.  I called and scheduled the pt for follow up on 02/03/2018.  Nothing further needed at this time.

## 2018-01-28 NOTE — Telephone Encounter (Signed)
RX sent

## 2018-01-28 NOTE — Telephone Encounter (Signed)
Copied from CRM [id number]. Topic: Quick Communication - Rx Refill/Question >> Jan 28, 2018 10:37 AM Herby Abraham C wrote: Medication: One Touch Viro Flex Test strips. - new Rx for pt. Pt received a new meter from her insurance company and now need the strips for new meter.   Has the patient contacted their pharmacy? Yes   (Agent: If no, request that the patient contact the pharmacy for the refill.) (Agent: If yes, when and what did the pharmacy advise?)  Preferred Pharmacy (with phone number or street name): [facility] [address] ([address] [place] - [address] [phone] (Phone) [phone] (Fax)    Agent: Please be advised that RX refills may take up to 3 business days. We ask that you follow-up with your pharmacy.

## 2018-02-03 ENCOUNTER — Ambulatory Visit: Payer: Medicare HMO | Admitting: Adult Health

## 2018-02-08 ENCOUNTER — Ambulatory Visit: Payer: Medicare HMO | Admitting: Internal Medicine

## 2018-02-08 ENCOUNTER — Other Ambulatory Visit: Payer: Self-pay | Admitting: Internal Medicine

## 2018-02-11 ENCOUNTER — Other Ambulatory Visit: Payer: Self-pay | Admitting: Internal Medicine

## 2018-02-15 ENCOUNTER — Ambulatory Visit: Payer: Medicare HMO | Admitting: Adult Health

## 2018-02-15 ENCOUNTER — Other Ambulatory Visit: Payer: Self-pay | Admitting: Internal Medicine

## 2018-04-05 ENCOUNTER — Ambulatory Visit: Payer: Medicare HMO | Admitting: Adult Health

## 2018-04-07 DIAGNOSIS — R69 Illness, unspecified: Secondary | ICD-10-CM | POA: Diagnosis not present

## 2018-04-13 ENCOUNTER — Telehealth: Payer: Self-pay

## 2018-04-13 MED ORDER — INSULIN LISPRO (1 UNIT DIAL) 100 UNIT/ML (KWIKPEN): PEN_INJECTOR | SUBCUTANEOUS | 11 refills | Status: DC

## 2018-04-13 NOTE — Telephone Encounter (Signed)
Received fax from patient insurance that Novolog Flexpen is no longer covered on the formulary.  Please advise.

## 2018-04-13 NOTE — Telephone Encounter (Signed)
New RX sent

## 2018-04-13 NOTE — Telephone Encounter (Signed)
Let's send same dose Humalog

## 2018-04-14 MED ORDER — INSULIN PEN NEEDLE 31G X 6 MM MISC: 0 refills | Status: DC

## 2018-04-14 NOTE — Telephone Encounter (Signed)
Patient notified and agreed to change.

## 2018-04-18 ENCOUNTER — Other Ambulatory Visit: Payer: Self-pay | Admitting: Adult Health

## 2018-04-18 DIAGNOSIS — I10 Essential (primary) hypertension: Secondary | ICD-10-CM

## 2018-04-19 NOTE — Telephone Encounter (Signed)
Sent to the pharmacy by e-scribe for 90 days due to COVID-19.  Pt due for cpx.

## 2018-04-22 ENCOUNTER — Other Ambulatory Visit: Payer: Self-pay | Admitting: Adult Health

## 2018-04-22 DIAGNOSIS — I10 Essential (primary) hypertension: Secondary | ICD-10-CM

## 2018-04-25 NOTE — Telephone Encounter (Signed)
Pt has asked that I call her tomorrow.  She would like to do a Webex appt.  Will have a family member help her to load the correct app on her phone.  Will reschedule appt.

## 2018-04-26 ENCOUNTER — Telehealth: Payer: Self-pay | Admitting: Internal Medicine

## 2018-04-26 ENCOUNTER — Ambulatory Visit: Payer: Medicare HMO | Admitting: Adult Health

## 2018-04-26 ENCOUNTER — Other Ambulatory Visit: Payer: Self-pay | Admitting: Adult Health

## 2018-04-26 NOTE — Telephone Encounter (Signed)
Ok to use this as long acting insulin - same dose.

## 2018-04-26 NOTE — Telephone Encounter (Signed)
Pt has stopped taking HCTZ.  Removed from medication list.  Continues other medication.  She is scheduled for Webex on 04/28/2018.

## 2018-04-26 NOTE — Telephone Encounter (Signed)
Patient stated that her insurance is only going to cover Levemir from this point forward.  Please Advise, Thanks

## 2018-04-27 MED ORDER — INSULIN DETEMIR 100 UNIT/ML FLEXPEN: 30.0000 [IU] | PEN_INJECTOR | Freq: Every day | SUBCUTANEOUS | 11 refills | Status: DC

## 2018-04-27 MED ORDER — INSULIN PEN NEEDLE 31G X 6 MM MISC: 11 refills | Status: DC

## 2018-04-27 NOTE — Addendum Note (Signed)
Addended by: Darliss Ridgel I on: 04/27/2018 10:32 AM   Modules accepted: Orders

## 2018-04-27 NOTE — Telephone Encounter (Signed)
Notified patient and sent refill.

## 2018-04-28 ENCOUNTER — Encounter: Payer: Self-pay | Admitting: Adult Health

## 2018-04-28 ENCOUNTER — Ambulatory Visit (INDEPENDENT_AMBULATORY_CARE_PROVIDER_SITE_OTHER): Payer: Medicare HMO | Admitting: Adult Health

## 2018-04-28 ENCOUNTER — Other Ambulatory Visit: Payer: Self-pay

## 2018-04-28 DIAGNOSIS — I10 Essential (primary) hypertension: Secondary | ICD-10-CM

## 2018-04-28 MED ORDER — HYDRALAZINE HCL 10 MG PO TABS: 10.0000 mg | ORAL_TABLET | Freq: Two times a day (BID) | ORAL | 0 refills | Status: DC

## 2018-04-28 NOTE — Progress Notes (Signed)
Virtual Visit via Video Note  I connected with Katrina Garrison on 04/28/18 at 10:30 AM EDT by a video enabled telemedicine application and verified that I am speaking with the correct person using two identifiers.  Location patient: home Location provider:work or home office Persons participating in the virtual visit: patient, provider  I discussed the limitations of evaluation and management by telemedicine and the availability of in person appointments. The patient expressed understanding and agreed to proceed.   HPI:  Any 78-year-old female who is being evaluated today via WebEx for follow-up regarding hypertension.  She has a longstanding history of hypertension as well as a longstanding history of being noncompliant in the past with taking her medications.  In the past she stopped taking Hyzaar due to cough and stopped taking metoprolol due to leg swelling/leg cramping.  ACE inhibitor's have also caused her to have a dry chronic cough.  She is currently prescribed Norvasc 10 mg and HCTZ. She reports that she stopped taking HCTZ last week as it was making her feel lethargic taking Norvasc every night.  He has been monitoring her blood pressures at home and most recently rated reading this morning of 168/88 and yesterday 155/83.  He does not report any readings of blood pressures above 180 systolic.  Denies headaches, blurred vision, dizziness, or lightheadedness.  BP Readings from Last 3 Encounters:  10/25/17 (!) 170/90  03/03/17 (!) 160/94  11/19/16 (!) 174/104     ROS: See pertinent positives and negatives per HPI.  Past Medical History:  Diagnosis Date  . Anemia   . Diabetes mellitus type II 2005  . Diverticulosis   . GERD (gastroesophageal reflux disease)   . History of lithotripsy    x3 for kidney stones  . Hypertension   . Kidney stones     Past Surgical History:  Procedure Laterality Date  . ABDOMINAL HYSTERECTOMY    . childbirth x 2    . EYE SURGERY      cataract    Family History  Problem Relation Age of Onset  . Thyroid cancer Mother   . Diabetes Mother   . Hypertension Mother   . Alcohol abuse Father   . Hypertension Father   . Multiple sclerosis Daughter   . Multiple sclerosis Daughter   . Multiple sclerosis Sister       Current Outpatient Medications:  .  amLODipine (NORVASC) 10 MG tablet, Take 1 tablet by mouth once daily, Disp: 90 tablet, Rfl: 0 .  aspirin 81 MG tablet, Take 81 mg by mouth daily., Disp: , Rfl:  .  glucose blood (ONETOUCH VERIO) test strip, Use as instructed to check blood sugar 3 times a day, Disp: 300 each, Rfl: 12 .  hydrALAZINE (APRESOLINE) 10 MG tablet, Take 1 tablet (10 mg total) by mouth 2 (two) times daily for 30 days., Disp: 60 tablet, Rfl: 0 .  ibuprofen (ADVIL,MOTRIN) 200 MG tablet, Take 200 mg by mouth every 6 (six) hours as needed. Reported on 03/22/2015, Disp: , Rfl:  .  Insulin Detemir (LEVEMIR FLEXTOUCH) 100 UNIT/ML Pen, Inject 30 Units into the skin daily., Disp: 10 pen, Rfl: 11 .  insulin lispro (HUMALOG KWIKPEN) 100 UNIT/ML KwikPen, Inject 12-15 units before each meal., Disp: 15 pen, Rfl: 11 .  Insulin Pen Needle (RELION PEN NEEDLES) 31G X 6 MM MISC, USE 1  THREE TIMES DAILY, Disp: 200 each, Rfl: 11 .  Liniments (BIOFLEXOR) 3 % GEL, Apply topically., Disp: , Rfl:   EXAM:  VITALS per  patient if applicable:  GENERAL: alert, oriented, appears well and in no acute distress  HEENT: atraumatic, conjunttiva clear, no obvious abnormalities on inspection of external nose and ears  NECK: normal movements of the head and neck  LUNGS: on inspection no signs of respiratory distress, breathing rate appears normal, no obvious gross SOB, gasping or wheezing  CV: no obvious cyanosis  MS: moves all visible extremities without noticeable abnormality  PSYCH/NEURO: pleasant and cooperative, no obvious depression or anxiety, speech and thought processing grossly intact  ASSESSMENT AND  PLAN:  Discussed the following assessment and plan:  Essential hypertension - Plan: hydrALAZINE (APRESOLINE) 10 MG tablet   -She has failed multiple blood pressure medications due to various subjective side effects.  We will continue her on Norvasc 10 mg and trial her on low-dose hydralazine 10 mg twice daily.  We will have her follow-up for WebEx visit in 1 week.  Advised to continue to monitor blood pressure twice a day, stop medication if she feels lightheaded or dizzy, and to work on lifestyle modifications.  I discussed the assessment and treatment plan with the patient. The patient was provided an opportunity to ask questions and all were answered. The patient agreed with the plan and demonstrated an understanding of the instructions.   The patient was advised to call back or seek an in-person evaluation if the symptoms worsen or if the condition fails to improve as anticipated.   [name], NP

## 2018-05-05 ENCOUNTER — Other Ambulatory Visit: Payer: Self-pay

## 2018-05-05 ENCOUNTER — Ambulatory Visit (INDEPENDENT_AMBULATORY_CARE_PROVIDER_SITE_OTHER): Payer: Medicare HMO | Admitting: Adult Health

## 2018-05-05 ENCOUNTER — Encounter: Payer: Self-pay | Admitting: Adult Health

## 2018-05-05 VITALS — BP 146/78

## 2018-05-05 DIAGNOSIS — I10 Essential (primary) hypertension: Secondary | ICD-10-CM | POA: Diagnosis not present

## 2018-05-05 NOTE — Progress Notes (Signed)
Virtual Visit via Video Note  I connected with Katrina Garrison 05/05/18 at 10:00 AM EDT by a video enabled telemedicine application and verified that I am speaking with the correct person using two identifiers.  Location patient: home Location provider:work or home office Persons participating in the virtual visit: patient, provider  I discussed the limitations of evaluation and management by telemedicine and the availability of in person appointments. The patient expressed understanding and agreed to proceed.   HPI:   78 year old female who is following up regarding hypertension.  Was last evaluated 1 week ago at which time she been monitoring her blood pressures at home and her most recent reading that morning was 168/88 and the day prior her blood pressure is 155/83.  Was taking Norvasc 10 mg daily.  She had many issues with blood pressure medications in the past.  The decision was made to put her on hydralazine 10 mg twice daily for tighter blood pressure control.  Today she reports that she has been walking every day for the last week, she is up to 20-30 minutes every day. She has been taking Hydralazine as directed and has been monitoring her BP, she reports readings consistently in the low 140's/70-80's. She is not having any side effects of the medication. She denies issues with hypotension.   BP Readings from Last 3 Encounters:  05/05/18 (!) 146/78  10/25/17 (!) 170/90  03/03/17 (!) 160/94      ROS: See pertinent positives and negatives per HPI.  Past Medical History:  Diagnosis Date  . Anemia   . Diabetes mellitus type II 2005  . Diverticulosis   . GERD (gastroesophageal reflux disease)   . History of lithotripsy    x3 for kidney stones  . Hypertension   . Kidney stones     Past Surgical History:  Procedure Laterality Date  . ABDOMINAL HYSTERECTOMY    . childbirth x 2    . EYE SURGERY     cataract    Family History  Problem Relation Age of Onset  .  Thyroid cancer Mother   . Diabetes Mother   . Hypertension Mother   . Alcohol abuse Father   . Hypertension Father   . Multiple sclerosis Daughter   . Multiple sclerosis Daughter   . Multiple sclerosis Sister      Current Outpatient Medications:  .  amLODipine (NORVASC) 10 MG tablet, Take 1 tablet by mouth once daily, Disp: 90 tablet, Rfl: 0 .  aspirin 81 MG tablet, Take 81 mg by mouth daily., Disp: , Rfl:  .  glucose blood (ONETOUCH VERIO) test strip, Use as instructed to check blood sugar 3 times a day, Disp: 300 each, Rfl: 12 .  hydrALAZINE (APRESOLINE) 10 MG tablet, Take 1 tablet (10 mg total) by mouth 2 (two) times daily for 30 days., Disp: 60 tablet, Rfl: 0 .  ibuprofen (ADVIL,MOTRIN) 200 MG tablet, Take 200 mg by mouth every 6 (six) hours as needed. Reported on 03/22/2015, Disp: , Rfl:  .  Insulin Detemir (LEVEMIR FLEXTOUCH) 100 UNIT/ML Pen, Inject 30 Units into the skin daily., Disp: 10 pen, Rfl: 11 .  insulin lispro (HUMALOG KWIKPEN) 100 UNIT/ML KwikPen, Inject 12-15 units before each meal., Disp: 15 pen, Rfl: 11 .  Insulin Pen Needle (RELION PEN NEEDLES) 31G X 6 MM MISC, USE 1  THREE TIMES DAILY, Disp: 200 each, Rfl: 11 .  Liniments (BIOFLEXOR) 3 % GEL, Apply topically., Disp: , Rfl:   EXAM:  VITALS per patient if  applicable:  GENERAL: alert, oriented, appears well and in no acute distress  HEENT: atraumatic, conjunttiva clear, no obvious abnormalities on inspection of external nose and ears  NECK: normal movements of the head and neck  LUNGS: on inspection no signs of respiratory distress, breathing rate appears normal, no obvious gross SOB, gasping or wheezing  CV: no obvious cyanosis  MS: moves all visible extremities without noticeable abnormality  PSYCH/NEURO: pleasant and cooperative, no obvious depression or anxiety, speech and thought processing grossly intact  ASSESSMENT AND PLAN:  Discussed the following assessment and plan:  Essential hypertension - BP  is improving with addition of Hydralazine 10 mg BID. Will keep her on current dose. Encouraged to keep walking daily. Continue to monitor BP at home and return precautions reviewed     I discussed the assessment and treatment plan with the patient. The patient was provided an opportunity to ask questions and all were answered. The patient agreed with the plan and demonstrated an understanding of the instructions.   The patient was advised to call back or seek an in-person evaluation if the symptoms worsen or if the condition fails to improve as anticipated.   [name], NP

## 2018-05-18 ENCOUNTER — Ambulatory Visit: Payer: Medicare HMO | Admitting: Internal Medicine

## 2018-06-06 ENCOUNTER — Other Ambulatory Visit: Payer: Self-pay | Admitting: Adult Health

## 2018-06-06 DIAGNOSIS — I10 Essential (primary) hypertension: Secondary | ICD-10-CM

## 2018-06-07 ENCOUNTER — Other Ambulatory Visit: Payer: Self-pay | Admitting: Adult Health

## 2018-06-07 DIAGNOSIS — Z1231 Encounter for screening mammogram for malignant neoplasm of breast: Secondary | ICD-10-CM

## 2018-06-07 NOTE — Telephone Encounter (Signed)
Sent to the pharmacy by e-scribe. 

## 2018-06-12 NOTE — Progress Notes (Signed)
Patient ID: Katrina Garrison, female   DOB: 07/29/1940, 78 y.o.   MRN: [id number]  Patient location: Home My location: Office  Referring Provider: [name], NP  I connected with the patient on 06/12/18 at  8:50 AM EDT by a vphone application and verified that I am speaking with the correct person.   I discussed the limitations of evaluation and management by phone and the availability of in person appointments. The patient expressed understanding and agreed to proceed.   Details of the encounter are shown below.  HPI: Katrina Garrison is a 78 y.o.-year-old female, returning for f/u for DM2, dx 2009, insulin-dependent since 2012-13, uncontrolled, with complications (DR, + hospitalization for diabetic coma x 2, PN). Last visit 8 months ago.  She is not usually compliant with her appointments and the recommended treatments. She has [employer].  She started to walk 30 min daily.  At last visit she was off [place] and per her sugar log at home, her CBGs were close to goal.  However, and HbA1c returned at 11%!  We restarted long-acting insulin.  She had to switch to Levemir as Lantus was not covered anymore. She is now also on Humalog instead of NovoLog per insurance preference.  Latest hemoglobin A1c: Lab Results  Component Value Date   HGBA1C 11.0 (A) 10/25/2017   HGBA1C 7.7 11/19/2016   HGBA1C 8.4 05/22/2016  Anti-islet cell and anti-GAD antibodies were undetectable.  Patient is on the following regimen: - Basaglar 45 units daily at bedtime >> (could not tolerate [place]) >> Levemir 30 units at bedtime  - not taking She got a rash + itching from Toujeo. Stopped Metformin in 11/2013 for bloody stools. She had severe diarrhea from metformin before. Stopped Januvia 100 mg daily >> b/c CP. Pt was in the past on a regimen of Humalog 75/25 pen - 50 units daily once a day - but was forgetting the insulin 1/7 days. She was taking the insulin at bedtime >> lows at night and highs,  mostly in the 200s but also some in the 300s in am. She was also on Glipizide 10 bid in the past. A freestyle libre CGM was not covered by her insurance.  She checks her sugars 1-3 times a day: - am:  142-166 >> 69, 72-146 >> 79-138 >> 171-243, 252 w/o Humalog - 2 hours after breakfast: 126-140  >> n/c >> 73-151 >> n/c - before lunch: 136-177 >> 57, 72-144 >> 99-143 >> 190-200 - 2h after lunch: 138-171 >> 71-168 >> 72-133 >> n/c - before dinner: 132-141 >> 64, 79-142 >> 72-142 >> ? - after dinner: 133-194 >> 91-135 >> 102-141 >> ? - Bedtime: 121, 149 >> 85-144 >> 101-150 >> n/c Lows: 57, but feels low at night (not checking) >> 72 >> 111; she has hypoglycemia awareness in the 60s. Highest sugar 193 >> 151 >> 252.  -No CKD. Last BUN/Cr: Lab Results  Component Value Date   BUN 17 03/03/2017   CREATININE 0.84 03/03/2017  On losartan. -no hyperlipidemia: Lab Results  Component Value Date   CHOL 126 03/03/2017   HDL 42.00 03/03/2017   LDLCALC 69 03/03/2017   TRIG 76.0 03/03/2017   CHOLHDL 3 03/03/2017  Not on a statin. - Pt's last eye exam was in 01/2017: + DR, no macular edema.  + Cataracts. Had cataract Sx OU in 2015. -No numbness and tingling in her legs - Dr. Angelia Mould, podiatry.   She also has a history of asthma, and is on frequent  prednisone tapers, last around 02/2012; also HTN;  h/o nephrolithiasis, s/p lithotripsy; GERD.  ROS: Constitutional: no weight gain/+ weight loss (10 lbs), no fatigue, no subjective hyperthermia, no subjective hypothermia Eyes: no blurry vision, no xerophthalmia ENT: no sore throat, no nodules palpated in neck, no dysphagia, no odynophagia, no hoarseness Cardiovascular: no CP/no SOB/no palpitations/no leg swelling Respiratory: no cough/no SOB/no wheezing Gastrointestinal: no N/no V/no D/no C/no acid reflux Musculoskeletal: no muscle aches/no joint aches Skin: no rashes, no hair loss Neurological: no tremors/no numbness/no tingling/+  dizziness  I reviewed pt's medications, allergies, PMH, social hx, family hx, and changes were documented in the history of present illness. Otherwise, unchanged from my initial visit note.   Past Medical History:  Diagnosis Date  . Anemia   . Diabetes mellitus type II 2005  . Diverticulosis   . GERD (gastroesophageal reflux disease)   . History of lithotripsy    x3 for kidney stones  . Hypertension   . Kidney stones    Past Surgical History:  Procedure Laterality Date  . ABDOMINAL HYSTERECTOMY    . childbirth x 2    . EYE SURGERY     cataract   Social History   Socioeconomic History  . Marital status: Divorced    Spouse name: Not on file  . Number of children: Not on file  . Years of education: Not on file  . Highest education level: Not on file  Occupational History  . Occupation: Retired  Engineer, production  . Financial resource strain: Not on file  . Food insecurity:    Worry: Not on file    Inability: Not on file  . Transportation needs:    Medical: Not on file    Non-medical: Not on file  Tobacco Use  . Smoking status: Former Smoker    Years: 1.50    Last attempt to quit: 01/28/1985    Years since quitting: 33.3  . Smokeless tobacco: Never Used  Substance and Sexual Activity  . Alcohol use: No  . Drug use: No  . Sexual activity: Not on file  Lifestyle  . Physical activity:    Days per week: Not on file    Minutes per session: Not on file  . Stress: Not on file  Relationships  . Social connections:    Talks on phone: Not on file    Gets together: Not on file    Attends religious service: Not on file    Active member of club or organization: Not on file    Attends meetings of clubs or organizations: Not on file    Relationship status: Not on file  . Intimate partner violence:    Fear of current or ex partner: Not on file    Emotionally abused: Not on file    Physically abused: Not on file    Forced sexual activity: Not on file  Other Topics Concern  .  Not on file  Social History Narrative  . Not on file   Current Outpatient Medications on File Prior to Visit  Medication Sig Dispense Refill  . amLODipine (NORVASC) 10 MG tablet Take 1 tablet by mouth once daily 90 tablet 0  . aspirin 81 MG tablet Take 81 mg by mouth daily.    Marland Kitchen glucose blood (ONETOUCH VERIO) test strip Use as instructed to check blood sugar 3 times a day 300 each 12  . hydrALAZINE (APRESOLINE) 10 MG tablet Take 1 tablet by mouth twice daily for 30 days 180 tablet 0  .  ibuprofen (ADVIL,MOTRIN) 200 MG tablet Take 200 mg by mouth every 6 (six) hours as needed. Reported on 03/22/2015    . Insulin Detemir (LEVEMIR FLEXTOUCH) 100 UNIT/ML Pen Inject 30 Units into the skin daily. 10 pen 11  . insulin lispro (HUMALOG KWIKPEN) 100 UNIT/ML KwikPen Inject 12-15 units before each meal. 15 pen 11  . Insulin Pen Needle (RELION PEN NEEDLES) 31G X 6 MM MISC USE 1  THREE TIMES DAILY 200 each 11  . Liniments (BIOFLEXOR) 3 % GEL Apply topically.     No current facility-administered medications on file prior to visit.    Allergies  Allergen Reactions  . Hydrochlorothiazide   . Lisinopril Cough  . Losartan Cough  . Metformin And Related Diarrhea  . Metoprolol Swelling    Leg cramping   . Sulfamethoxazole     REACTION: "comatose"  . Toujeo Solostar [Insulin Glargine] Rash   Family History  Problem Relation Age of Onset  . Thyroid cancer Mother   . Diabetes Mother   . Hypertension Mother   . Alcohol abuse Father   . Hypertension Father   . Multiple sclerosis Daughter   . Multiple sclerosis Daughter   . Multiple sclerosis Sister     PE: There were no vitals taken for this visit. There is no height or weight on file to calculate BMI. Wt Readings from Last 3 Encounters:  10/25/17 142 lb (64.4 kg)  03/03/17 148 lb (67.1 kg)  11/19/16 146 lb (66.2 kg)   Constitutional:  in NAD  The physical exam was not performed (virtual visit).  ASSESSMENT: 1. DM2, insulin-dependent,  uncontrolled, with complications - DR - PN  2. HTN  PLAN:  1. Patient with uncontrolled diabetes, noncompliant with the medication regimen and visits, returning after another long absence.  At last visit she was off [place] for several months and she was reporting normal blood sugars at home.  An HbA1c return extremely high, at 11%, much higher than before.  We discussed about the fact that her test strips or meter may be old.  I advised her to get the ReliOn meter from [place]. A CBG in the office was 378 at that time.  We restarted long-acting insulin (Lantus) but since then her insurance changed coverage to Levemir. - at this visit, she tells me that her sugars were at or close to goal before she stopped Humalog, but her insurance did not pay for this anymore and she did not call me to let me know.  As of now, she is off rapid acting insulin and her sugars started to increase significantly.  We discussed about the fact that we can use regular insulin from [employer], in a vial form and she agrees to try this.  I again advised her that she repeatedly tried to use only one insulin of the 2 and this never worked for her.  Therefore, she absolutely needs to stay on a basal-bolus insulin regimen for now.  I suspect that she does have insulin deficiency. -She inquires about Ozempic and I think this is an option, but for now we decided to start with regular insulin and then try to add a low-dose Ozempic.  Of note, she had chest pain from Januvia in the past. - I advised her to:  Please continue: - Levemir 30 units at bedtime  Please start: - ReliOn Regular insulin 10-12 units 30 min before meals  Please come back for a follow-up appointment in 3-4 months.  - We will check an  HbA1c when she returns to the clinic - continue checking sugars at different times of the day - check 3x a day, rotating checks - advised for yearly eye exams >> she is not UTD - Return to clinic in 3-4 mo with sugar log    -  time spent with the patient: 18 min, of which >50% was spent in obtaining information about her symptoms, reviewing her previous labs, evaluations, and treatments, counseling her about her condition (please see the discussed topics above), and developing a plan to further investigate and treat it; she had a number of questions which I addressed.  Carlus Pavlovristina Levelle Edelen, MD PhD [facility]

## 2018-06-13 ENCOUNTER — Ambulatory Visit (INDEPENDENT_AMBULATORY_CARE_PROVIDER_SITE_OTHER): Payer: Medicare HMO | Admitting: Internal Medicine

## 2018-06-13 ENCOUNTER — Telehealth: Payer: Self-pay | Admitting: Internal Medicine

## 2018-06-13 ENCOUNTER — Encounter: Payer: Self-pay | Admitting: Internal Medicine

## 2018-06-13 ENCOUNTER — Other Ambulatory Visit: Payer: Self-pay

## 2018-06-13 DIAGNOSIS — E785 Hyperlipidemia, unspecified: Secondary | ICD-10-CM

## 2018-06-13 DIAGNOSIS — E1165 Type 2 diabetes mellitus with hyperglycemia: Secondary | ICD-10-CM

## 2018-06-13 DIAGNOSIS — Z794 Long term (current) use of insulin: Secondary | ICD-10-CM

## 2018-06-13 NOTE — Telephone Encounter (Signed)
Patient called and wanted to let Dr Elvera Lennox know she picked up her medication and will be taking 12 units as prescribed

## 2018-06-13 NOTE — Telephone Encounter (Signed)
NOTED, THANK YOU.

## 2018-06-13 NOTE — Patient Instructions (Addendum)
Please continue: - Levemir 30 units at bedtime  Please start: - ReliOn Regular insulin 10-12 units 30 min before meals  Please come back for a follow-up appointment in 3-4 months.

## 2018-06-21 ENCOUNTER — Telehealth: Payer: Self-pay | Admitting: Adult Health

## 2018-06-21 NOTE — Telephone Encounter (Signed)
Copied from CRM [id number]. Topic: Quick Communication - See Telephone Encounter >> Jun 21, 2018 10:01 AM Trula Slade wrote: CRM for notification. See Telephone encounter for: 06/21/18. Patient stated she has been experiencing feet, ankle and leg pain and she thinks it is coming from the amLODipine (NORVASC) 10 MG tablet medication that she is taking.  She would like to stop taking it.  Please advise.

## 2018-06-21 NOTE — Telephone Encounter (Signed)
I don't think it is the Norvasc, she has been on this medication for some time.   Lets do a doxy visit

## 2018-06-22 ENCOUNTER — Ambulatory Visit: Payer: Medicare HMO | Admitting: Adult Health

## 2018-06-22 NOTE — Telephone Encounter (Signed)
Pt is now scheduled to see Cory at 3:30 this afternoon.  Nothing further needed.

## 2018-07-22 ENCOUNTER — Telehealth: Payer: Self-pay | Admitting: Internal Medicine

## 2018-07-22 NOTE — Telephone Encounter (Signed)
Patients is informing Dr. Cruzita Lederer that she ordered [facility] Flextouch, Relion Pen Needles-90 day supply from [place] stated to patient she should receive the above in a couple of days. Also, Patient will pick up a 90 day supply of Novalin & Syringes on July 1 at [place] on [address]. Patient wants Dr. Cruzita Lederer to know that she has been doing well and has maintained a very strict regiman.

## 2018-07-22 NOTE — Telephone Encounter (Signed)
Noted.  FYI to Dr. Cruzita Lederer.

## 2018-07-27 ENCOUNTER — Other Ambulatory Visit: Payer: Self-pay

## 2018-07-27 MED ORDER — LEVEMIR FLEXTOUCH 100 UNIT/ML ~~LOC~~ SOPN: 30.0000 [IU] | PEN_INJECTOR | Freq: Every day | SUBCUTANEOUS | 11 refills | Status: DC

## 2018-07-27 MED ORDER — RELION PEN NEEDLES 31G X 6 MM MISC: 11 refills | Status: DC

## 2018-07-28 ENCOUNTER — Other Ambulatory Visit: Payer: Self-pay

## 2018-07-28 MED ORDER — LEVEMIR FLEXTOUCH 100 UNIT/ML ~~LOC~~ SOPN: 30.0000 [IU] | PEN_INJECTOR | Freq: Every day | SUBCUTANEOUS | 11 refills | Status: DC

## 2018-08-01 ENCOUNTER — Ambulatory Visit: Payer: Medicare HMO

## 2018-08-03 ENCOUNTER — Other Ambulatory Visit: Payer: Self-pay | Admitting: *Deleted

## 2018-08-03 DIAGNOSIS — Z20822 Contact with and (suspected) exposure to covid-19: Secondary | ICD-10-CM

## 2018-08-08 LAB — NOVEL CORONAVIRUS, NAA: SARS-CoV-2, NAA: NOT DETECTED

## 2018-08-16 ENCOUNTER — Telehealth: Payer: Self-pay | Admitting: Hematology

## 2018-08-16 NOTE — Telephone Encounter (Signed)
Pt is aware covid 19 test is negative °

## 2018-10-14 ENCOUNTER — Ambulatory Visit (INDEPENDENT_AMBULATORY_CARE_PROVIDER_SITE_OTHER): Payer: Medicare HMO | Admitting: Internal Medicine

## 2018-10-14 ENCOUNTER — Encounter: Payer: Self-pay | Admitting: Internal Medicine

## 2018-10-14 ENCOUNTER — Other Ambulatory Visit: Payer: Self-pay

## 2018-10-14 DIAGNOSIS — E1165 Type 2 diabetes mellitus with hyperglycemia: Secondary | ICD-10-CM | POA: Diagnosis not present

## 2018-10-14 DIAGNOSIS — Z794 Long term (current) use of insulin: Secondary | ICD-10-CM

## 2018-10-14 NOTE — Progress Notes (Signed)
Patient ID: Katrina Garrison, female   DOB: Sep 15, 1940, 78 y.o.   MRN: [id number]  Patient location: Home My location: Office  Referring Provider: Shirline FreesNafziger, Cory, NP  I connected with the patient on 10/14/18 at 9:55 AM EDT by telephone and verified that I am speaking with the correct person.   I discussed the limitations of evaluation and management by telephone and the availability of in person appointments. The patient expressed understanding and agreed to proceed.   Details of the encounter are shown below.  HPI: Katrina Melteraulette Zamarripa is a 78 y.o.-year-old female, returning for f/u for DM2, dx 2009, insulin-dependent since 2012-13, uncontrolled, with complications (DR, + hospitalization for diabetic coma x 2, PN). Last visit 4 months ago.   She has [employer] insurance.  She is not usually compliant with the recommended treatment and whenever she presents for follow-up she is either off long-acting insulin or off short acting insulin for various reasons.  At last visit, we continued Levemir and we added back mealtime insulin in the form of regular insulin, which is affordable.  Latest hemoglobin A1c: Lab Results  Component Value Date   HGBA1C 11.0 (A) 10/25/2017   HGBA1C 7.7 11/19/2016   HGBA1C 8.4 05/22/2016  Anti-islet cell and anti-GAD antibodies were undetectable.  Patient is on the following regimen: - Basaglar 45 units daily at bedtime >> (could not tolerate [place]) >> Levemir 30 units at bedtime  - not taking >> Novolin Regular insulin 11-04-08 units (at bedtime!) not related to meals -added 05/2018 She got a rash + itching from Toujeo. Stopped Metformin in 11/2013 for bloody stools. She had severe diarrhea from metformin before. Stopped Januvia 100 mg daily >> b/c CP. Pt was in the past on a regimen of Humalog 75/25 pen - 50 units daily once a day - but was forgetting the insulin 1/7 days. She was taking the insulin at bedtime >> lows at night and highs, mostly in the 200s but also  some in the 300s in am. She was also on Glipizide 10 bid in the past. A freestyle libre CGM was not covered by her insurance.  She checks her sugars 1-3 times a day: - am:  69, 72-146 >> 79-138 >> 171-243, 252 w/o Humalog >> 113-146 - 2 hours after breakfast: 126-140  >> n/c >> 73-151 >> n/c - before lunch:  57, 72-144 >> 99-143 >> 190-200 >> 133-148 - 2h after lunch: 138-171 >> 71-168 >> 72-133 >> n/c - before dinner: 132-141 >> 64, 79-142 >> 72-142 >> ? >> 132-147 - after dinner: 133-194 >> 91-135 >> 102-141 >> ? - Bedtime: 121, 149 >> 85-144 >> 101-150 >> n/c >> 162-177 Lows: 57, but feels low at night (not checking) ...>> 113; she has hypoglycemia awareness in the 60s. Highest sugar 252 >> 177.  She walks 1 mi a day - 5 days a week.  -No CKD. Last BUN/Cr: Lab Results  Component Value Date   BUN 17 03/03/2017   CREATININE 0.84 03/03/2017  On losartan. -No hyperlipidemia: Lab Results  Component Value Date   CHOL 126 03/03/2017   HDL 42.00 03/03/2017   LDLCALC 69 03/03/2017   TRIG 76.0 03/03/2017   CHOLHDL 3 03/03/2017  Not on a statin. - Pt's last eye exam was in 01/2017: + DR; no macular edema.  She had bilateral cataract surgery 2015. Eye exam scheduled 11/29/2018. - She denies numbness and tingling in her legs - Dr. Angelia MouldAdjoury, podiatry.   She also has a history of asthma-on prednisone  tapers; also HTN;  h/o nephrolithiasis, s/p lithotripsy; current.  ROS: Constitutional: no weight gain/+ weight loss, no fatigue, no subjective hyperthermia, no subjective hypothermia Eyes: + blurry vision, no xerophthalmia ENT: no sore throat, no nodules palpated in neck, no dysphagia, no odynophagia, no hoarseness Cardiovascular: no CP/no SOB/no palpitations/no leg swelling Respiratory: no cough/no SOB/no wheezing Gastrointestinal: no N/no V/no D/no C/no acid reflux Musculoskeletal: no muscle aches/no joint aches Skin: no rashes, no hair loss Neurological: no tremors/no numbness/no  tingling/no dizziness  I reviewed pt's medications, allergies, PMH, social hx, family hx, and changes were documented in the history of present illness. Otherwise, unchanged from my initial visit note.   Past Medical History:  Diagnosis Date  . Anemia   . Diabetes mellitus type II 2005  . Diverticulosis   . GERD (gastroesophageal reflux disease)   . History of lithotripsy    x3 for kidney stones  . Hypertension   . Kidney stones    Past Surgical History:  Procedure Laterality Date  . ABDOMINAL HYSTERECTOMY    . childbirth x 2    . EYE SURGERY     cataract   Social History   Socioeconomic History  . Marital status: Divorced    Spouse name: Not on file  . Number of children: Not on file  . Years of education: Not on file  . Highest education level: Not on file  Occupational History  . Occupation: Retired  Engineer, production  . Financial resource strain: Not on file  . Food insecurity    Worry: Not on file    Inability: Not on file  . Transportation needs    Medical: Not on file    Non-medical: Not on file  Tobacco Use  . Smoking status: Former Smoker    Years: 1.50    Quit date: 01/28/1985    Years since quitting: 33.7  . Smokeless tobacco: Never Used  Substance and Sexual Activity  . Alcohol use: No  . Drug use: No  . Sexual activity: Not on file  Lifestyle  . Physical activity    Days per week: Not on file    Minutes per session: Not on file  . Stress: Not on file  Relationships  . Social Musician on phone: Not on file    Gets together: Not on file    Attends religious service: Not on file    Active member of club or organization: Not on file    Attends meetings of clubs or organizations: Not on file    Relationship status: Not on file  . Intimate partner violence    Fear of current or ex partner: Not on file    Emotionally abused: Not on file    Physically abused: Not on file    Forced sexual activity: Not on file  Other Topics Concern  . Not  on file  Social History Narrative  . Not on file   Current Outpatient Medications on File Prior to Visit  Medication Sig Dispense Refill  . amLODipine (NORVASC) 10 MG tablet Take 1 tablet by mouth once daily 90 tablet 0  . aspirin 81 MG tablet Take 81 mg by mouth daily.    Marland Kitchen glucose blood (ONETOUCH VERIO) test strip Use as instructed to check blood sugar 3 times a day 300 each 12  . hydrALAZINE (APRESOLINE) 10 MG tablet Take 1 tablet by mouth twice daily for 30 days 180 tablet 0  . ibuprofen (ADVIL,MOTRIN) 200 MG tablet Take  200 mg by mouth every 6 (six) hours as needed. Reported on 03/22/2015    . Insulin Detemir (LEVEMIR FLEXTOUCH) 100 UNIT/ML Pen Inject 30 Units into the skin daily. 10 pen 11  . insulin lispro (HUMALOG KWIKPEN) 100 UNIT/ML KwikPen Inject 12-15 units before each meal. 15 pen 11  . Insulin Pen Needle (RELION PEN NEEDLES) 31G X 6 MM MISC USE 1  THREE TIMES DAILY 200 each 11  . Liniments (BIOFLEXOR) 3 % GEL Apply topically.     No current facility-administered medications on file prior to visit.    Allergies  Allergen Reactions  . Hydrochlorothiazide   . Lisinopril Cough  . Losartan Cough  . Metformin And Related Diarrhea  . Metoprolol Swelling    Leg cramping   . Sulfamethoxazole     REACTION: "comatose"  . Toujeo Solostar [Insulin Glargine] Rash   Family History  Problem Relation Age of Onset  . Thyroid cancer Mother   . Diabetes Mother   . Hypertension Mother   . Alcohol abuse Father   . Hypertension Father   . Multiple sclerosis Daughter   . Multiple sclerosis Daughter   . Multiple sclerosis Sister     PE: Wt: 130 lbs There were no vitals taken for this visit. There is no height or weight on file to calculate BMI. Wt Readings from Last 3 Encounters:  10/25/17 142 lb (64.4 kg)  03/03/17 148 lb (67.1 kg)  11/19/16 146 lb (66.2 kg)   Constitutional:  in NAD  The physical exam was not performed (telephone visit).  ASSESSMENT: 1. DM2,  insulin-dependent, uncontrolled, with complications - DR - PN  PLAN:  1. Patient with uncontrolled insulin-dependent diabetes, noncompliant with medication regimen and visits, with still poor control.  At last visit, she was off mealtime insulin as she could not afford it and we started regular insulin, ReliOn, which is now affordable.  At that time, she was still having high blood sugars throughout the day.  We discussed then that she absolutely needs to be on both types of insulin for good diabetes control.  I do suspect that she has a degree of insulin deficiency. -At last visit she inquired about Ozempic and we can definitely consider this in the future.  We have to be careful as she had chest pain from Januvia in the past. -At this visit, she does take both long and short acting insulin, however, she takes regular insulin every 8 hours, with a relationship to the meals.  We again discussed that it is important to take the regular insulin approximately 30 minutes before every meal.  As of now, she takes the last dose of regular insulin at bedtime, which is dangerous since she may drop her sugars too much overnight.  We will move this before dinner.  Otherwise, I do not think that she needs different doses of the insulins and sugars appear much improved compared to last visit.  I did advise her to increase the dose of regular insulin before a larger meal, if eating a dessert, or for holiday meals. - I advised her to:  Patient Instructions  Please continue: - Levemir 30 units at bedtime  Move: - ReliOn Regular insulin 10-12 units 30 min - to before meals  Please come back for a follow-up appointment in 3-4 months.  - we would check her HbA1c when she returns to the clinic - advised to check sugars at different times of the day - 3-4x a day, rotating check times -  advised for yearly eye exams >> she is not UTD, but the next eye exam is scheduled - return to clinic in 3-4 months    - time  spent with the patient: 12 min, of which >50% was spent in obtaining information about her symptoms, reviewing her previous labs, evaluations, and treatments, counseling her about her condition (please see the discussed topics above), and developing a plan to further investigate and treat it.  Philemon Kingdom, MD PhD [facility]

## 2018-10-14 NOTE — Patient Instructions (Addendum)
Please continue: - Levemir 30 units at bedtime  Move: - ReliOn Regular insulin 10-12 units 30 min - to before meals  Please come back for a follow-up appointment in 3-4 months.

## 2018-11-29 DIAGNOSIS — Z961 Presence of intraocular lens: Secondary | ICD-10-CM | POA: Diagnosis not present

## 2018-11-29 DIAGNOSIS — H35033 Hypertensive retinopathy, bilateral: Secondary | ICD-10-CM | POA: Diagnosis not present

## 2018-11-29 DIAGNOSIS — H04123 Dry eye syndrome of bilateral lacrimal glands: Secondary | ICD-10-CM | POA: Diagnosis not present

## 2018-11-29 DIAGNOSIS — H35363 Drusen (degenerative) of macula, bilateral: Secondary | ICD-10-CM | POA: Diagnosis not present

## 2018-11-29 DIAGNOSIS — E113293 Type 2 diabetes mellitus with mild nonproliferative diabetic retinopathy without macular edema, bilateral: Secondary | ICD-10-CM | POA: Diagnosis not present

## 2018-11-29 DIAGNOSIS — H524 Presbyopia: Secondary | ICD-10-CM | POA: Diagnosis not present

## 2018-11-29 LAB — HM DIABETES EYE EXAM

## 2018-12-16 ENCOUNTER — Encounter: Payer: Self-pay | Admitting: Family Medicine

## 2018-12-27 ENCOUNTER — Encounter: Payer: Self-pay | Admitting: Internal Medicine

## 2018-12-27 ENCOUNTER — Ambulatory Visit (INDEPENDENT_AMBULATORY_CARE_PROVIDER_SITE_OTHER): Payer: Medicare HMO | Admitting: Internal Medicine

## 2018-12-27 ENCOUNTER — Other Ambulatory Visit: Payer: Self-pay

## 2018-12-27 DIAGNOSIS — E1165 Type 2 diabetes mellitus with hyperglycemia: Secondary | ICD-10-CM

## 2018-12-27 DIAGNOSIS — E785 Hyperlipidemia, unspecified: Secondary | ICD-10-CM | POA: Diagnosis not present

## 2018-12-27 DIAGNOSIS — Z794 Long term (current) use of insulin: Secondary | ICD-10-CM

## 2018-12-27 MED ORDER — LEVEMIR FLEXTOUCH 100 UNIT/ML ~~LOC~~ SOPN: 32.0000 [IU] | PEN_INJECTOR | Freq: Every day | SUBCUTANEOUS | 11 refills | Status: DC

## 2018-12-27 NOTE — Patient Instructions (Addendum)
Please increase: - Levemir 32-34 units at bedtime  Continue: - ReliOn Regular insulin 12-15 units 30 minutes before meals  Please come back for a follow-up appointment in 3 to 4 months.

## 2018-12-27 NOTE — Progress Notes (Signed)
Patient ID: Katrina Garrison, female   DOB: 1940-08-19, 78 y.o.   MRN: [id number]  Patient location: Home My location: Office  Referring Provider: Dorothyann Peng, NP  I connected with the patient on 12/27/18 at  9:22 AM EST by telephone and verified that I am speaking with the correct person.   I discussed the limitations of evaluation and management by telephone and the availability of in person appointments. The patient expressed understanding and agreed to proceed.   Details of the encounter are shown below.  HPI: Siobahn Worsley is a 78 y.o.-year-old female, returning for f/u for DM2, dx 2009, insulin-dependent since 2012-13, uncontrolled, with complications (DR, + hospitalization for diabetic coma x 2, PN). Last visit 4 months ago.   She has [employer].  She is not usually compliant with the recommended treatment and whenever she presented for follow-up she was either off long-acting insulin or off short acting insulin for various reasons.  We then continued Levemir and added mealtime insulin in the form of regular insulin, which is now affordable.  At last visit, she was taking this but without relationship to the meals.  I again advised her to move the doses 30 minutes before meals.  Reviewed HbA1c levels: Lab Results  Component Value Date   HGBA1C 11.0 (A) 10/25/2017   HGBA1C 7.7 11/19/2016   HGBA1C 8.4 05/22/2016  Anti-islet cell and anti-GAD antibodies were undetectable.  Patient is on the following regimen: - Levemir 30 units at bedtime  Novolin Regular insulin 11-04-08 units without relationship to the meals -added 05/2018 >> 12-15 units 15 minutes before meals (she is not injecting 30 min before meals) She got a rash + itching from [place].  She also could not tolerate [place]. Stopped Metformin in 11/2013 for bloody stools. She had severe diarrhea from metformin before. Stopped Januvia 100 mg daily >> b/c CP. Pt was in the past on a regimen of Humalog 75/25 pen -  50 units daily once a day - but was forgetting the insulin 1/7 days. She was taking the insulin at bedtime >> lows at night and highs, mostly in the 200s but also some in the 300s in am. She was also on Glipizide 10 bid in the past. A freestyle libre CGM was not covered by her insurance.  She checks her sugars 3 times a day: - am:  171-243, 252 w/o Humalog >> 113-146 >> 148-178 - 2 hours after breakfast: 126-140  >> n/c >> 73-151 >> n/c - before lunch: 99-143 >> 190-200 >> 133-148 >> 74, 102-167 - 2h after lunch: 138-171 >> 71-168 >> 72-133 >> n/c - before dinner:  64, 79-142 >> 72-142 >> ? >> 132-147 >> 120-134 - after dinner: 133-194 >> 91-135 >> 102-141 >> ? - Bedtime: 85-144 >> 101-150 >> n/c >> 162-177 >> 102-193 (snack) Lows: 57, but feels low at night (not checking) ...>> 113 >> 74; she has hypoglycemia awareness in the 60s. Highest sugar 252 >> 177 >> 200s.  She still walks 1 mile a day 5 days a week - in am.  -No CKD. Last BUN/Cr: Lab Results  Component Value Date   BUN 17 03/03/2017   CREATININE 0.84 03/03/2017  On losartan. -No HL: Lab Results  Component Value Date   CHOL 126 03/03/2017   HDL 42.00 03/03/2017   LDLCALC 69 03/03/2017   TRIG 76.0 03/03/2017   CHOLHDL 3 03/03/2017  She is not on a statin. - Pt's last eye exam was in 11/29/2018: + DR.  She had bilateral cataract surgery 2015.  - no numbness and tingling in her legs - Dr. Angelia Mould, podiatry.   She also has a history of asthma-on prednisone tapers; also HTN;  h/o nephrolithiasis, s/p lithotripsy; current.  ROS: Constitutional: + weight gain/no weight loss, no fatigue, no subjective hyperthermia, no subjective hypothermia Eyes: no blurry vision, no xerophthalmia ENT: no sore throat, no nodules palpated in neck, no dysphagia, no odynophagia, no hoarseness Cardiovascular: no CP/no SOB/no palpitations/no leg swelling Respiratory: no cough/no SOB/no wheezing Gastrointestinal: no N/no V/no D/no C/no acid  reflux Musculoskeletal: no muscle aches/no joint aches Skin: no rashes, no hair loss Neurological: no tremors/no numbness/no tingling/no dizziness  I reviewed pt's medications, allergies, PMH, social hx, family hx, and changes were documented in the history of present illness. Otherwise, unchanged from my initial visit note.   Past Medical History:  Diagnosis Date  . Anemia   . Diabetes mellitus type II 2005  . Diverticulosis   . GERD (gastroesophageal reflux disease)   . History of lithotripsy    x3 for kidney stones  . Hypertension   . Kidney stones    Past Surgical History:  Procedure Laterality Date  . ABDOMINAL HYSTERECTOMY    . childbirth x 2    . EYE SURGERY     cataract   Social History   Socioeconomic History  . Marital status: Divorced    Spouse name: Not on file  . Number of children: Not on file  . Years of education: Not on file  . Highest education level: Not on file  Occupational History  . Occupation: Retired  Engineer, production  . Financial resource strain: Not on file  . Food insecurity    Worry: Not on file    Inability: Not on file  . Transportation needs    Medical: Not on file    Non-medical: Not on file  Tobacco Use  . Smoking status: Former Smoker    Years: 1.50    Quit date: 01/28/1985    Years since quitting: 33.9  . Smokeless tobacco: Never Used  Substance and Sexual Activity  . Alcohol use: No  . Drug use: No  . Sexual activity: Not on file  Lifestyle  . Physical activity    Days per week: Not on file    Minutes per session: Not on file  . Stress: Not on file  Relationships  . Social Musician on phone: Not on file    Gets together: Not on file    Attends religious service: Not on file    Active member of club or organization: Not on file    Attends meetings of clubs or organizations: Not on file    Relationship status: Not on file  . Intimate partner violence    Fear of current or ex partner: Not on file     Emotionally abused: Not on file    Physically abused: Not on file    Forced sexual activity: Not on file  Other Topics Concern  . Not on file  Social History Narrative  . Not on file   Current Outpatient Medications on File Prior to Visit  Medication Sig Dispense Refill  . amLODipine (NORVASC) 10 MG tablet Take 1 tablet by mouth once daily 90 tablet 0  . aspirin 81 MG tablet Take 81 mg by mouth daily.    Marland Kitchen glucose blood (ONETOUCH VERIO) test strip Use as instructed to check blood sugar 3 times a day 300 each  12  . hydrALAZINE (APRESOLINE) 10 MG tablet Take 1 tablet by mouth twice daily for 30 days 180 tablet 0  . ibuprofen (ADVIL,MOTRIN) 200 MG tablet Take 200 mg by mouth every 6 (six) hours as needed. Reported on 03/22/2015    . Insulin Detemir (LEVEMIR FLEXTOUCH) 100 UNIT/ML Pen Inject 30 Units into the skin daily. 10 pen 11  . insulin lispro (HUMALOG KWIKPEN) 100 UNIT/ML KwikPen Inject 12-15 units before each meal. 15 pen 11  . Insulin Pen Needle (RELION PEN NEEDLES) 31G X 6 MM MISC USE 1  THREE TIMES DAILY 200 each 11  . Liniments (BIOFLEXOR) 3 % GEL Apply topically.     No current facility-administered medications on file prior to visit.    Allergies  Allergen Reactions  . Hydrochlorothiazide   . Lisinopril Cough  . Losartan Cough  . Metformin And Related Diarrhea  . Metoprolol Swelling    Leg cramping   . Sulfamethoxazole     REACTION: "comatose"  . Toujeo Solostar [Insulin Glargine] Rash   Family History  Problem Relation Age of Onset  . Thyroid cancer Mother   . Diabetes Mother   . Hypertension Mother   . Alcohol abuse Father   . Hypertension Father   . Multiple sclerosis Daughter   . Multiple sclerosis Daughter   . Multiple sclerosis Sister     PE: 12/27/2018: wt 136 lbs, 171/71,  09/2018: wt 130 lbs There were no vitals taken for this visit. There is no height or weight on file to calculate BMI. Wt Readings from Last 3 Encounters:  10/25/17 142 lb (64.4  kg)  03/03/17 148 lb (67.1 kg)  11/19/16 146 lb (66.2 kg)   Constitutional:  in NAD  The physical exam was not performed (telephone visit).  ASSESSMENT: 1. DM2, insulin-dependent, uncontrolled, with complications - DR - PN  PLAN:  1. Patient with uncontrolled, insulin-dependent diabetes, noncompliant with medication regimen and visits, with still poor control.  In the past, she did not use mealtime insulin as advised, despite repeated instructions.  In the last few months, she started to take mealtime insulin, but at last visit, she was still taking it without a relationship to the meals.  I again advised her to take this approximately 30 minutes before every meal.  I advised her against taking regular insulin at bedtime, which she was doing at last visit, to avoid precipitous drops in blood sugars overnight.  We did not change her insulin doses at that time. -At a previous visit, she inquired about Ozempic and we can definitely use this in the future.  I do suspect that she has a degree of insulin deficiency, so this would be a condition, rather than a substitution for her.  We have to be careful that she had chest pain from Januvia in the past. -At this visit, sugars are at goal later in the day, but they are higher in the morning.  She is occasionally having a snack at bedtime as she goes to bed late, at 1-2 am in the morning.  I advised her to increase the dose of Levemir slightly, but I think we can continue the same dose of regular insulin.  She is injecting now 15 minutes before meals, but I advised her to try to inject approximately 30 minutes before meals. -She continues to walk 5 times a week for a mild and I encouraged her to continue this - I advised her to:  Please increase: - Levemir 32-34 units at bedtime  Continue: - ReliOn Regular insulin 12-15 units 30 minutes before meals  Please come back for a follow-up appointment in 3 to 4 months.  - we would check her HbA1c when she  returns to the clinic - Advised to check sugars at different times the day, 3-4 times a day, rotating check times - She is up-to-date with yearly eye exams - return to clinic in 3-4 months    - time spent with the patient: 14 minutes, of which >50% was spent in obtaining information about her symptoms, reviewing her previous labs, evaluations, and treatments, counseling her about her condition (please see the discussed topics above), and developing a plan to further investigate and treat it.  Carlus Pavlovristina Aviya Jarvie, MD PhD [facility]

## 2019-01-07 DIAGNOSIS — Z20828 Contact with and (suspected) exposure to other viral communicable diseases: Secondary | ICD-10-CM | POA: Diagnosis not present

## 2019-10-08 ENCOUNTER — Other Ambulatory Visit: Payer: Self-pay | Admitting: Internal Medicine

## 2019-10-17 DIAGNOSIS — G603 Idiopathic progressive neuropathy: Secondary | ICD-10-CM | POA: Diagnosis not present

## 2019-10-17 DIAGNOSIS — I70293 Other atherosclerosis of native arteries of extremities, bilateral legs: Secondary | ICD-10-CM | POA: Diagnosis not present

## 2019-10-17 DIAGNOSIS — M21612 Bunion of left foot: Secondary | ICD-10-CM | POA: Diagnosis not present

## 2019-10-17 DIAGNOSIS — M792 Neuralgia and neuritis, unspecified: Secondary | ICD-10-CM | POA: Diagnosis not present

## 2019-10-17 DIAGNOSIS — E1351 Other specified diabetes mellitus with diabetic peripheral angiopathy without gangrene: Secondary | ICD-10-CM | POA: Diagnosis not present

## 2019-10-17 DIAGNOSIS — M21611 Bunion of right foot: Secondary | ICD-10-CM | POA: Diagnosis not present

## 2019-10-19 DIAGNOSIS — I739 Peripheral vascular disease, unspecified: Secondary | ICD-10-CM | POA: Diagnosis not present

## 2019-11-03 ENCOUNTER — Ambulatory Visit (INDEPENDENT_AMBULATORY_CARE_PROVIDER_SITE_OTHER): Payer: Medicare HMO

## 2019-11-03 ENCOUNTER — Other Ambulatory Visit: Payer: Self-pay

## 2019-11-03 ENCOUNTER — Encounter: Payer: Self-pay | Admitting: Adult Health

## 2019-11-03 ENCOUNTER — Ambulatory Visit (INDEPENDENT_AMBULATORY_CARE_PROVIDER_SITE_OTHER): Payer: Medicare HMO | Admitting: Adult Health

## 2019-11-03 VITALS — BP 142/98 | HR 80 | Temp 98.3°F | Ht 61.0 in | Wt 133.2 lb

## 2019-11-03 DIAGNOSIS — I10 Essential (primary) hypertension: Secondary | ICD-10-CM | POA: Diagnosis not present

## 2019-11-03 DIAGNOSIS — R109 Unspecified abdominal pain: Secondary | ICD-10-CM

## 2019-11-03 DIAGNOSIS — Z23 Encounter for immunization: Secondary | ICD-10-CM | POA: Diagnosis not present

## 2019-11-03 LAB — VITAMIN D 25 HYDROXY (VIT D DEFICIENCY, FRACTURES): Vit D, 25-Hydroxy: 36 ng/mL (ref 30–100)

## 2019-11-03 MED ORDER — AMLODIPINE BESYLATE 10 MG PO TABS: 10.0000 mg | ORAL_TABLET | Freq: Every day | ORAL | 0 refills | Status: DC

## 2019-11-03 NOTE — Progress Notes (Signed)
Subjective:    Patient ID: Katrina Garrison, female    DOB: 04/15/1940, 79 y.o.   MRN: [id number]  HPI 79 year old female who  has a past medical history of Anemia, Diabetes mellitus type II (2005), Diverticulosis, GERD (gastroesophageal reflux disease), History of lithotripsy, Hypertension, and Kidney stones.  She presents to the office today for the complaint of right flank pain.  She reports that she has had a constant "aching pain" over the last 2 to 3 months.  She denies symptoms such as nausea, vomiting, diarrhea, constipation,  fevers, chills, UTI symptoms, or hematuria.  Additionally, she has not been taking any of her blood pressure medication.  Currently prescribed Norvasc 10 mg daily.  She reports that she stopped this medication because it was making her feel "weird".  She has a long history of noncompliance with taking her medications nor following up with her office visits   BP Readings from Last 3 Encounters:  11/03/19 (!) 142/98  05/05/18 (!) 146/78  10/25/17 (!) 170/90    Review of Systems See HPI   Past Medical History:  Diagnosis Date  . Anemia   . Diabetes mellitus type II 2005  . Diverticulosis   . GERD (gastroesophageal reflux disease)   . History of lithotripsy    x3 for kidney stones  . Hypertension   . Kidney stones     Social History   Socioeconomic History  . Marital status: Divorced    Spouse name: Not on file  . Number of children: Not on file  . Years of education: Not on file  . Highest education level: Not on file  Occupational History  . Occupation: Retired  Tobacco Use  . Smoking status: Former Smoker    Years: 1.50    Quit date: 01/28/1985    Years since quitting: 34.7  . Smokeless tobacco: Never Used  Substance and Sexual Activity  . Alcohol use: No  . Drug use: No  . Sexual activity: Not on file  Other Topics Concern  . Not on file  Social History Narrative  . Not on file   Social Determinants of Health   Financial  Resource Strain:   . Difficulty of Paying Living Expenses: Not on file  Food Insecurity:   . Worried About Charity fundraiser in the Last Year: Not on file  . Ran Out of Food in the Last Year: Not on file  Transportation Needs:   . Lack of Transportation (Medical): Not on file  . Lack of Transportation (Non-Medical): Not on file  Physical Activity:   . Days of Exercise per Week: Not on file  . Minutes of Exercise per Session: Not on file  Stress:   . Feeling of Stress : Not on file  Social Connections:   . Frequency of Communication with Friends and Family: Not on file  . Frequency of Social Gatherings with Friends and Family: Not on file  . Attends Religious Services: Not on file  . Active Member of Clubs or Organizations: Not on file  . Attends Archivist Meetings: Not on file  . Marital Status: Not on file  Intimate Partner Violence:   . Fear of Current or Ex-Partner: Not on file  . Emotionally Abused: Not on file  . Physically Abused: Not on file  . Sexually Abused: Not on file    Past Surgical History:  Procedure Laterality Date  . ABDOMINAL HYSTERECTOMY    . childbirth x 2    .  EYE SURGERY     cataract    Family History  Problem Relation Age of Onset  . Thyroid cancer Mother   . Diabetes Mother   . Hypertension Mother   . Alcohol abuse Father   . Hypertension Father   . Multiple sclerosis Daughter   . Multiple sclerosis Daughter   . Multiple sclerosis Sister     Allergies  Allergen Reactions  . Hydrochlorothiazide   . Lisinopril Cough  . Losartan Cough  . Metformin And Related Diarrhea  . Metoprolol Swelling    Leg cramping   . Sulfamethoxazole     REACTION: "comatose"  . Toujeo Solostar [Insulin Glargine] Rash    Current Outpatient Medications on File Prior to Visit  Medication Sig Dispense Refill  . aspirin 81 MG tablet Take 81 mg by mouth daily.    Marland Kitchen glucose blood (ONETOUCH VERIO) test strip Use as instructed to check blood sugar 3  times a day 300 each 12  . ibuprofen (ADVIL,MOTRIN) 200 MG tablet Take 200 mg by mouth every 6 (six) hours as needed. Reported on 03/22/2015    . Insulin Pen Needle (RELION PEN NEEDLES) 31G X 6 MM MISC USE 1  THREE TIMES DAILY 200 each 11  . insulin regular (NOVOLIN R) 100 units/mL injection Inject 15 Units into the skin 3 (three) times daily before meals.    Marland Kitchen LEVEMIR FLEXTOUCH 100 UNIT/ML FlexPen INJECT 30 UNITS SUBCUTANEOUSLY ONCE DAILY 15 mL 0  . amLODipine (NORVASC) 10 MG tablet Take 1 tablet by mouth once daily (Patient not taking: Reported on 11/03/2019) 90 tablet 0  . hydrALAZINE (APRESOLINE) 10 MG tablet Take 1 tablet by mouth twice daily for 30 days (Patient not taking: Reported on 11/03/2019) 180 tablet 0  . insulin lispro (HUMALOG KWIKPEN) 100 UNIT/ML KwikPen Inject 12-15 units before each meal. (Patient not taking: Reported on 11/03/2019) 15 pen 11  . Liniments (BIOFLEXOR) 3 % GEL Apply topically. (Patient not taking: Reported on 11/03/2019)     No current facility-administered medications on file prior to visit.    BP (!) 142/98 (BP Location: Left Arm, Patient Position: Sitting, Cuff Size: Small)   Pulse 80   Temp 98.3 F (36.8 C)   Ht '5\' 1"'  (1.549 m)   Wt 133 lb 3.2 oz (60.4 kg)   SpO2 93%   BMI 25.17 kg/m       Objective:   Physical Exam Vitals and nursing note reviewed.  Constitutional:      Appearance: Normal appearance.  Cardiovascular:     Rate and Rhythm: Normal rate and regular rhythm.     Pulses: Normal pulses.     Heart sounds: Normal heart sounds.  Pulmonary:     Effort: Pulmonary effort is normal.     Breath sounds: Normal breath sounds.  Abdominal:     General: Abdomen is flat. Bowel sounds are normal.     Palpations: Abdomen is soft.     Tenderness: There is abdominal tenderness. There is right CVA tenderness. There is no left CVA tenderness, guarding or rebound. Negative signs include Murphy's sign, Rovsing's sign, McBurney's sign, psoas sign and  obturator sign.     Hernia: No hernia is present.  Musculoskeletal:        General: Normal range of motion.  Skin:    General: Skin is warm and dry.  Neurological:     General: No focal deficit present.     Mental Status: She is alert and oriented to person, place, and  time.  Psychiatric:        Mood and Affect: Mood normal.        Behavior: Behavior normal.        Thought Content: Thought content normal.        Judgment: Judgment normal.       Assessment & Plan:  1. Flank pain -Unknown cause at this time.  She did have some mild CVA tenderness.  Will check abdominal labs, urinalysis, and KUB to rule out kidney stone.  Consider further imaging in the future. - CMP with eGFR([facility]); Future - CBC with Differential/Platelet; Future - Culture, Urine; Future - Urinalysis; Future - Lipase; Future - Amylase; Future - DG Abd 1 View; Future - VITAMIN D 25 Hydroxy (Vit-D Deficiency, Fractures); Future - Amylase - Lipase - Urinalysis - Culture, Urine - CBC with Differential/Platelet - CMP with eGFR([facility]) - VITAMIN D 25 Hydroxy (Vit-D Deficiency, Fractures)  2. Essential hypertension - Restart on Norvasc. She will follow up for her CPE in the next month  - amLODipine (NORVASC) 10 MG tablet; Take 1 tablet (10 mg total) by mouth daily.  Dispense: 90 tablet; Refill: 0  3. Need for immunization against influenza  - Flu Vaccine QUAD High Dose(Fluad)  Dorothyann Peng, NP

## 2019-11-03 NOTE — Patient Instructions (Signed)
Schedule your physical exam on the way out   I am going to send in your blood pressure medication - Norvasc. Take this every single day   I will follow up with you regarding your labs and xray

## 2019-11-05 LAB — CBC WITH DIFFERENTIAL/PLATELET
Absolute Monocytes: 365 cells/uL (ref 200–950)
Basophils Absolute: 17 cells/uL (ref 0–200)
Basophils Relative: 0.2 %
Eosinophils Absolute: 44 cells/uL (ref 15–500)
Eosinophils Relative: 0.5 %
HCT: 40.7 % (ref 35.0–45.0)
Hemoglobin: 12.8 g/dL (ref 11.7–15.5)
Lymphs Abs: 1018 cells/uL (ref 850–3900)
MCH: 26.8 pg — ABNORMAL LOW (ref 27.0–33.0)
MCHC: 31.4 g/dL — ABNORMAL LOW (ref 32.0–36.0)
MCV: 85.1 fL (ref 80.0–100.0)
MPV: 11.7 fL (ref 7.5–12.5)
Monocytes Relative: 4.2 %
Neutro Abs: 7256 cells/uL (ref 1500–7800)
Neutrophils Relative %: 83.4 %
Platelets: 241 10*3/uL (ref 140–400)
RBC: 4.78 10*6/uL (ref 3.80–5.10)
RDW: 15 % (ref 11.0–15.0)
Total Lymphocyte: 11.7 %
WBC: 8.7 10*3/uL (ref 3.8–10.8)

## 2019-11-05 LAB — AMYLASE: Amylase: 92 U/L (ref 21–101)

## 2019-11-05 LAB — URINALYSIS
Bilirubin Urine: NEGATIVE
Hgb urine dipstick: NEGATIVE
Ketones, ur: NEGATIVE
Leukocytes,Ua: NEGATIVE
Nitrite: NEGATIVE
Specific Gravity, Urine: 1.013 (ref 1.001–1.03)
pH: 6 (ref 5.0–8.0)

## 2019-11-05 LAB — COMPLETE METABOLIC PANEL WITH GFR
AG Ratio: 1.3 (calc) (ref 1.0–2.5)
ALT: 11 U/L (ref 6–29)
AST: 13 U/L (ref 10–35)
Albumin: 4.1 g/dL (ref 3.6–5.1)
Alkaline phosphatase (APISO): 91 U/L (ref 37–153)
BUN/Creatinine Ratio: 21 (calc) (ref 6–22)
BUN: 22 mg/dL (ref 7–25)
CO2: 26 mmol/L (ref 20–32)
Calcium: 9.8 mg/dL (ref 8.6–10.4)
Chloride: 102 mmol/L (ref 98–110)
Creat: 1.05 mg/dL — ABNORMAL HIGH (ref 0.60–0.93)
GFR, Est African American: 59 mL/min/{1.73_m2} — ABNORMAL LOW (ref >=60)
GFR, Est Non African American: 51 mL/min/{1.73_m2} — ABNORMAL LOW (ref >=60)
Globulin: 3.1 g/dL (calc) (ref 1.9–3.7)
Glucose, Bld: 282 mg/dL — ABNORMAL HIGH (ref 65–99)
Potassium: 4 mmol/L (ref 3.5–5.3)
Sodium: 138 mmol/L (ref 135–146)
Total Bilirubin: 0.5 mg/dL (ref 0.2–1.2)
Total Protein: 7.2 g/dL (ref 6.1–8.1)

## 2019-11-05 LAB — URINE CULTURE
MICRO NUMBER:: 11049672
SPECIMEN QUALITY:: ADEQUATE

## 2019-11-05 LAB — LIPASE: Lipase: 83 U/L — ABNORMAL HIGH (ref 7–60)

## 2019-11-05 NOTE — Addendum Note (Signed)
Addended by: Nancy Fetter on: 11/05/2019 08:50 AM   Modules accepted: Orders

## 2019-11-07 ENCOUNTER — Ambulatory Visit (INDEPENDENT_AMBULATORY_CARE_PROVIDER_SITE_OTHER): Payer: Medicare HMO

## 2019-11-07 ENCOUNTER — Ambulatory Visit: Payer: Medicare HMO | Admitting: Adult Health

## 2019-11-07 ENCOUNTER — Telehealth: Payer: Self-pay | Admitting: Adult Health

## 2019-11-07 DIAGNOSIS — Z1231 Encounter for screening mammogram for malignant neoplasm of breast: Secondary | ICD-10-CM | POA: Diagnosis not present

## 2019-11-07 DIAGNOSIS — Z0000 Encounter for general adult medical examination without abnormal findings: Secondary | ICD-10-CM

## 2019-11-07 NOTE — Telephone Encounter (Signed)
She was updated on labs and x-ray.  Her lipase was slightly elevated at 80, we will recheck this in a couple weeks.  She does report improvement in her right flank pain, not currently having any discomfort.  Kidney function was slightly decreased and we will recheck this in a few weeks at her physical as well.  She was advised to start taking her blood pressure medication and getting her diabetes under control.

## 2019-11-07 NOTE — Patient Instructions (Signed)
[name] , Thank you for taking time to come for your Medicare Wellness Visit. I appreciate your ongoing commitment to your health goals. Please review the following plan we discussed and let me know if I can assist you in the future.   Screening recommendations/referrals: Colonoscopy: No longer required Mammogram: Currently due, orders placed for Mammogram this visit Bone Density: Up to date, next due December 2021 Recommended yearly ophthalmology/optometry visit for glaucoma screening and checkup Recommended yearly dental visit for hygiene and checkup  Vaccinations: Influenza vaccine: Up to date, next due fall 2022 Pneumococcal vaccine: Completed series Tdap vaccine: Up to date, next due 06/11/2027 Shingles vaccine: Completed series    Advanced directives: Please bring copies of your Advanced Medical Directives into the office so that we may scan them into your chart.  Conditions/risks identified: None   Next appointment: 12/20/2019 @ 10:30 with [name], NP   Preventive Care 78 Years and Older, Female Preventive care refers to lifestyle choices and visits with your health care provider that can promote health and wellness. What does preventive care include?  A yearly physical exam. This is also called an annual well check.  Dental exams once or twice a year.  Routine eye exams. Ask your health care provider how often you should have your eyes checked.  Personal lifestyle choices, including:  Daily care of your teeth and gums.  Regular physical activity.  Eating a healthy diet.  Avoiding tobacco and drug use.  Limiting alcohol use.  Practicing safe sex.  Taking low-dose aspirin every day.  Taking vitamin and mineral supplements as recommended by your health care provider. What happens during an annual well check? The services and screenings done by your health care provider during your annual well check will depend on your age, overall health, lifestyle  risk factors, and family history of disease. Counseling  Your health care provider may ask you questions about your:  Alcohol use.  Tobacco use.  Drug use.  Emotional well-being.  Home and relationship well-being.  Sexual activity.  Eating habits.  History of falls.  Memory and ability to understand (cognition).  Work and work Astronomer.  Reproductive health. Screening  You may have the following tests or measurements:  Height, weight, and BMI.  Blood pressure.  Lipid and cholesterol levels. These may be checked every 5 years, or more frequently if you are over 72 years old.  Skin check.  Lung cancer screening. You may have this screening every year starting at age 26 if you have a 30-pack-year history of smoking and currently smoke or have quit within the past 15 years.  Fecal occult blood test (FOBT) of the stool. You may have this test every year starting at age 31.  Flexible sigmoidoscopy or colonoscopy. You may have a sigmoidoscopy every 5 years or a colonoscopy every 10 years starting at age 53.  Hepatitis C blood test.  Hepatitis B blood test.  Sexually transmitted disease (STD) testing.  Diabetes screening. This is done by checking your blood sugar (glucose) after you have not eaten for a while (fasting). You may have this done every 1-3 years.  Bone density scan. This is done to screen for osteoporosis. You may have this done starting at age 51.  Mammogram. This may be done every 1-2 years. Talk to your health care provider about how often you should have regular mammograms. Talk with your health care provider about your test results, treatment options, and if necessary, the need for more tests. Vaccines  Your health care provider may recommend certain vaccines, such as:  Influenza vaccine. This is recommended every year.  Tetanus, diphtheria, and acellular pertussis (Tdap, Td) vaccine. You may need a Td booster every 10 years.  Zoster vaccine.  You may need this after age 24.  Pneumococcal 13-valent conjugate (PCV13) vaccine. One dose is recommended after age 26.  Pneumococcal polysaccharide (PPSV23) vaccine. One dose is recommended after age 69. Talk to your health care provider about which screenings and vaccines you need and how often you need them. This information is not intended to replace advice given to you by your health care provider. Make sure you discuss any questions you have with your health care provider. Document Released: 02/08/2015 Document Revised: 10/02/2015 Document Reviewed: 11/13/2014 Elsevier Interactive Patient Education  2017 [place] Prevention in the Home Falls can cause injuries. They can happen to people of all ages. There are many things you can do to make your home safe and to help prevent falls. What can I do on the outside of my home?  Regularly fix the edges of walkways and driveways and fix any cracks.  Remove anything that might make you trip as you walk through a door, such as a raised step or threshold.  Trim any bushes or trees on the path to your home.  Use bright outdoor lighting.  Clear any walking paths of anything that might make someone trip, such as rocks or tools.  Regularly check to see if handrails are loose or broken. Make sure that both sides of any steps have handrails.  Any raised decks and porches should have guardrails on the edges.  Have any leaves, snow, or ice cleared regularly.  Use sand or salt on walking paths during winter.  Clean up any spills in your garage right away. This includes oil or grease spills. What can I do in the bathroom?  Use night lights.  Install grab bars by the toilet and in the tub and shower. Do not use towel bars as grab bars.  Use non-skid mats or decals in the tub or shower.  If you need to sit down in the shower, use a plastic, non-slip stool.  Keep the floor dry. Clean up any water that spills on the floor as soon  as it happens.  Remove soap buildup in the tub or shower regularly.  Attach bath mats securely with double-sided non-slip rug tape.  Do not have throw rugs and other things on the floor that can make you trip. What can I do in the bedroom?  Use night lights.  Make sure that you have a light by your bed that is easy to reach.  Do not use any sheets or blankets that are too big for your bed. They should not hang down onto the floor.  Have a firm chair that has side arms. You can use this for support while you get dressed.  Do not have throw rugs and other things on the floor that can make you trip. What can I do in the kitchen?  Clean up any spills right away.  Avoid walking on wet floors.  Keep items that you use a lot in easy-to-reach places.  If you need to reach something above you, use a strong step stool that has a grab bar.  Keep electrical cords out of the way.  Do not use floor polish or wax that makes floors slippery. If you must use wax, use non-skid floor wax.  Do  not have throw rugs and other things on the floor that can make you trip. What can I do with my stairs?  Do not leave any items on the stairs.  Make sure that there are handrails on both sides of the stairs and use them. Fix handrails that are broken or loose. Make sure that handrails are as [place] as the stairways.  Check any carpeting to make sure that it is firmly attached to the stairs. Fix any carpet that is loose or worn.  Avoid having throw rugs at the top or bottom of the stairs. If you do have throw rugs, attach them to the floor with carpet tape.  Make sure that you have a light switch at the top of the stairs and the bottom of the stairs. If you do not have them, ask someone to add them for you. What else can I do to help prevent falls?  Wear shoes that:  Do not have high heels.  Have rubber bottoms.  Are comfortable and fit you well.  Are closed at the toe. Do not wear sandals.  If  you use a stepladder:  Make sure that it is fully opened. Do not climb a closed stepladder.  Make sure that both sides of the stepladder are locked into place.  Ask someone to hold it for you, if possible.  Clearly mark and make sure that you can see:  Any grab bars or handrails.  First and last steps.  Where the edge of each step is.  Use tools that help you move around (mobility aids) if they are needed. These include:  Canes.  Walkers.  Scooters.  Crutches.  Turn on the lights when you go into a dark area. Replace any light bulbs as soon as they burn out.  Set up your furniture so you have a clear path. Avoid moving your furniture around.  If any of your floors are uneven, fix them.  If there are any pets around you, be aware of where they are.  Review your medicines with your doctor. Some medicines can make you feel dizzy. This can increase your chance of falling. Ask your doctor what other things that you can do to help prevent falls. This information is not intended to replace advice given to you by your health care provider. Make sure you discuss any questions you have with your health care provider. Document Released: 11/08/2008 Document Revised: 06/20/2015 Document Reviewed: 02/16/2014 Elsevier Interactive Patient Education  2017 [employer].

## 2019-11-07 NOTE — Progress Notes (Signed)
Subjective:   Katrina Garrison is a 79 y.o. female who presents for an Initial Medicare Annual Wellness Visit.  I connected with Katrina Garrison  today by telephone and verified that I am speaking with the correct person using two identifiers. Location patient: home Location provider: work Persons participating in the virtual visit: patient, provider.   I discussed the limitations, risks, security and privacy concerns of performing an evaluation and management service by telephone and the availability of in person appointments. I also discussed with the patient that there may be a patient responsible charge related to this service. The patient expressed understanding and verbally consented to this telephonic visit.    Interactive audio and video telecommunications were attempted between this provider and patient, however failed, due to patient having technical difficulties OR patient did not have access to video capability.  We continued and completed visit with audio only.      Review of Systems    N/A  Cardiac Risk Factors include: advanced age (>6455men, 60>65 women);hypertension;diabetes mellitus;dyslipidemia     Objective:    Today's Vitals   There is no height or weight on file to calculate BMI.  Advanced Directives 11/07/2019 03/26/2015 01/18/2013  Does Patient Have a Medical Advance Directive? Yes Yes Patient does not have advance directive;Patient would not like information  Type of Public librarianAdvance Directive Healthcare Power of [place] SpringsAtt[place];Living will - -  Does patient want to make changes to medical advance directive? No - Patient declined - -  Copy of Healthcare Power of Attorney in Chart? No - copy requested - -  Pre-existing out of facility DNR order (yellow form or pink MOST form) - - No    Current Medications (verified) Outpatient Encounter Medications as of 11/07/2019  Medication Sig  . amLODipine (NORVASC) 10 MG tablet Take 1 tablet (10 mg total) by mouth daily.  Marland Kitchen.  aspirin 81 MG tablet Take 81 mg by mouth daily.  Marland Kitchen. glucose blood (ONETOUCH VERIO) test strip Use as instructed to check blood sugar 3 times a day  . ibuprofen (ADVIL,MOTRIN) 200 MG tablet Take 200 mg by mouth every 6 (six) hours as needed. Reported on 03/22/2015  . insulin lispro (HUMALOG KWIKPEN) 100 UNIT/ML KwikPen Inject 12-15 units before each meal.  . Insulin Pen Needle (RELION PEN NEEDLES) 31G X 6 MM MISC USE 1  THREE TIMES DAILY  . insulin regular (NOVOLIN R) 100 units/mL injection Inject 15 Units into the skin 3 (three) times daily before meals.  Marland Kitchen. LEVEMIR FLEXTOUCH 100 UNIT/ML FlexPen INJECT 30 UNITS SUBCUTANEOUSLY ONCE DAILY   No facility-administered encounter medications on file as of 11/07/2019.    Allergies (verified) Hydrochlorothiazide, Lisinopril, Losartan, Metformin and related, Metoprolol, Sulfamethoxazole, and Toujeo solostar [insulin glargine]   History: Past Medical History:  Diagnosis Date  . Anemia   . Diabetes mellitus type II 2005  . Diverticulosis   . GERD (gastroesophageal reflux disease)   . History of lithotripsy    x3 for kidney stones  . Hypertension   . Kidney stones    Past Surgical History:  Procedure Laterality Date  . ABDOMINAL HYSTERECTOMY    . childbirth x 2    . EYE SURGERY     cataract   Family History  Problem Relation Age of Onset  . Thyroid cancer Mother   . Diabetes Mother   . Hypertension Mother   . Alcohol abuse Father   . Hypertension Father   . Multiple sclerosis Daughter   . Multiple sclerosis Daughter   .  Multiple sclerosis Sister    Social History   Socioeconomic History  . Marital status: Divorced    Spouse name: Not on file  . Number of children: Not on file  . Years of education: Not on file  . Highest education level: Not on file  Occupational History  . Occupation: Retired  Tobacco Use  . Smoking status: Former Smoker    Years: 1.50    Quit date: 01/28/1985    Years since quitting: 34.7  . Smokeless  tobacco: Never Used  Substance and Sexual Activity  . Alcohol use: No  . Drug use: No  . Sexual activity: Not on file  Other Topics Concern  . Not on file  Social History Narrative  . Not on file   Social Determinants of Health   Financial Resource Strain: Low Risk   . Difficulty of Paying Living Expenses: Not hard at all  Food Insecurity: No Food Insecurity  . Worried About Programme researcher, broadcasting/film/video in the Last Year: Never true  . Ran Out of Food in the Last Year: Never true  Transportation Needs: No Transportation Needs  . Lack of Transportation (Medical): No  . Lack of Transportation (Non-Medical): No  Physical Activity: Insufficiently Active  . Days of Exercise per Week: 3 days  . Minutes of Exercise per Session: 30 min  Stress: No Stress Concern Present  . Feeling of Stress : Not at all  Social Connections: Moderately Integrated  . Frequency of Communication with Friends and Family: More than three times a week  . Frequency of Social Gatherings with Friends and Family: More than three times a week  . Attends Religious Services: More than 4 times per year  . Active Member of Clubs or Organizations: Yes  . Attends Banker Meetings: More than 4 times per year  . Marital Status: Widowed    Tobacco Counseling Counseling given: Not Answered   Clinical Intake:  Pre-visit preparation completed: Yes  Pain : No/denies pain     Nutritional Risks: None Diabetes: Yes (Patient states she checks her blood sugars once daily) CBG done?: No Did pt. bring in CBG monitor from home?: No  How often do you need to have someone help you when you read instructions, pamphlets, or other written materials from your doctor or pharmacy?: 1 - Never What is the last grade level you completed in school?: 3 years of College  Diabetic?Yes   Interpreter Needed?: No  Information entered by :: SCrews,LPN   Activities of Daily Living In your present state of health, do you have any  difficulty performing the following activities: 11/07/2019  Hearing? N  Vision? N  Walking or climbing stairs? N  Dressing or bathing? N  Doing errands, shopping? N  Preparing Food and eating ? N  Using the Toilet? N  In the past six months, have you accidently leaked urine? N  Do you have problems with loss of bowel control? N  Managing your Medications? N  Managing your Finances? N  Housekeeping or managing your Housekeeping? N  Some recent data might be hidden    Patient Care Team: [name], NP as PCP - General (Family Medicine)  Indicate any recent Medical Services you may have received from other than [facility] providers in the past year (date may be approximate).     Assessment:   This is a routine wellness examination for Katrina Garrison.  Hearing/Vision screen  Hearing Screening   125Hz  250Hz  500Hz  1000Hz  2000Hz  3000Hz  4000Hz  6000Hz   8000Hz   Right ear:           Left ear:           Vision Screening Comments: Patient states she gets eyes checked every 6 months .  Dietary issues and exercise activities discussed: Current Exercise Habits: Home exercise routine, Type of exercise: walking, Time (Minutes): 30, Frequency (Times/Week): 3, Weekly Exercise (Minutes/Week): 90, Intensity: Mild, Exercise limited by: None identified  Goals    . Exercise 150 minutes per week (moderate activity)    . Reduce portion size    . Reduce salt intake to 2 grams per day or less    . Reduce sugar intake to X grams per day      Depression Screen PHQ 2/9 Scores 11/07/2019 03/03/2017 12/04/2015 03/26/2015 11/21/2014 10/06/2013 10/06/2013  PHQ - 2 Score 0 0 0 0 0 0 0  PHQ- 9 Score 0 - - - - - -    Fall Risk Fall Risk  11/07/2019 03/03/2017 12/04/2015 03/26/2015 11/21/2014  Falls in the past year? 0 No No No No  Number falls in past yr: 0 - - - -  Injury with Fall? 0 - - - -  Risk for fall due to : No Fall Risks - - - -  Follow up Falls evaluation completed;Falls prevention discussed - - - -    Any  stairs in or around the home? Yes  If so, are there any without handrails? No  Home free of loose throw rugs in walkways, pet beds, electrical cords, etc? Yes  Adequate lighting in your home to reduce risk of falls? Yes   ASSISTIVE DEVICES UTILIZED TO PREVENT FALLS:  Life alert? Yes  Use of a cane, walker or w/c? No  Grab bars in the bathroom? Yes  Shower chair or bench in shower? No  Elevated toilet seat or a handicapped toilet? Yes     Cognitive Function:     6CIT Screen 11/07/2019  What Year? 0 points  What month? 0 points  What time? 0 points  Count back from 20 0 points  Months in reverse 0 points  Repeat phrase 2 points  Total Score 2    Immunizations Immunization History  Administered Date(s) Administered  . Fluad Quad(high Dose 65+) 11/03/2019  . Influenza Split 12/16/2010, 11/11/2011, 11/08/2012  . Influenza Whole 01/26/2005, 12/06/2009  . Influenza, High Dose Seasonal PF 10/03/2014, 12/04/2015, 11/19/2016, 10/25/2017  . Influenza,inj,Quad PF,6+ Mos 11/23/2013  . PFIZER SARS-COV-2 Vaccination 02/17/2019, 03/10/2019  . Pneumococcal Conjugate-13 10/06/2013, 06/10/2017, 06/11/2017  . Pneumococcal Polysaccharide-23 01/27/2003, 12/04/2015  . Td 01/27/2003, 10/06/2013  . Tdap 06/10/2017  . Zoster Recombinat (Shingrix) 06/10/2017, 03/25/2018    TDAP status: Up to date Flu Vaccine status: Up to date Pneumococcal vaccine status: Up to date Covid-19 vaccine status: Completed vaccines  Qualifies for Shingles Vaccine? Yes   Zostavax completed No   Shingrix Completed?: Yes  Screening Tests Health Maintenance  Topic Date Due  . Hepatitis C Screening  Never done  . URINE MICROALBUMIN  10/14/2013  . FOOT EXAM  12/03/2016  . HEMOGLOBIN A1C  04/25/2018  . OPHTHALMOLOGY EXAM  11/29/2019  . TETANUS/TDAP  06/11/2027  . INFLUENZA VACCINE  Completed  . DEXA SCAN  Completed  . COVID-19 Vaccine  Completed  . PNA vac Low Risk Adult  Completed    Health  Maintenance  Health Maintenance Due  Topic Date Due  . Hepatitis C Screening  Never done  . URINE MICROALBUMIN  10/14/2013  . FOOT  EXAM  12/03/2016  . HEMOGLOBIN A1C  04/25/2018    Colorectal cancer screening: No longer required.  Mammogram status: Completed 03/04/2017. Repeat every year Bone Density status: Completed 01/15/2015. Results reflect: Bone density results: OSTEOPENIA. Repeat every 5 years.  Lung Cancer Screening: (Low Dose CT Chest recommended if Age 1-80 years, 30 pack-year currently smoking OR have quit w/in 15years.) does not qualify.   Lung Cancer Screening Referral: N/A   Additional Screening:  Hepatitis C Screening: does not qualify;   Vision Screening: Recommended annual ophthalmology exams for early detection of glaucoma and other disorders of the eye. Is the patient up to date with their annual eye exam?  Yes  Who is the provider or what is the name of the office in which the patient attends annual eye exams? Dr. Alben Spittle If pt is not established with a provider, would they like to be referred to a provider to establish care? No .   Dental Screening: Recommended annual dental exams for proper oral hygiene  Community Resource Referral / Chronic Care Management: CRR required this visit?  No   CCM required this visit?  No      Plan:     I have personally reviewed and noted the following in the patient's chart:   . Medical and social history . Use of alcohol, tobacco or illicit drugs  . Current medications and supplements . Functional ability and status . Nutritional status . Physical activity . Advanced directives . List of other physicians . Hospitalizations, surgeries, and ER visits in previous 12 months . Vitals . Screenings to include cognitive, depression, and falls . Referrals and appointments  In addition, I have reviewed and discussed with patient certain preventive protocols, quality metrics, and best practice recommendations. A written  personalized care plan for preventive services as well as general preventive health recommendations were provided to patient.     Theodora Blow, LPN   [phone]   Nurse Notes: None

## 2019-11-15 DIAGNOSIS — G603 Idiopathic progressive neuropathy: Secondary | ICD-10-CM | POA: Diagnosis not present

## 2019-11-15 DIAGNOSIS — E1351 Other specified diabetes mellitus with diabetic peripheral angiopathy without gangrene: Secondary | ICD-10-CM | POA: Diagnosis not present

## 2019-11-15 DIAGNOSIS — I70293 Other atherosclerosis of native arteries of extremities, bilateral legs: Secondary | ICD-10-CM | POA: Diagnosis not present

## 2019-11-15 DIAGNOSIS — M792 Neuralgia and neuritis, unspecified: Secondary | ICD-10-CM | POA: Diagnosis not present

## 2019-12-06 DIAGNOSIS — M79672 Pain in left foot: Secondary | ICD-10-CM | POA: Diagnosis not present

## 2019-12-06 DIAGNOSIS — M47896 Other spondylosis, lumbar region: Secondary | ICD-10-CM | POA: Diagnosis not present

## 2019-12-06 DIAGNOSIS — M79671 Pain in right foot: Secondary | ICD-10-CM | POA: Diagnosis not present

## 2019-12-19 ENCOUNTER — Encounter: Payer: Medicare HMO | Admitting: Adult Health

## 2019-12-20 ENCOUNTER — Encounter: Payer: Medicare HMO | Admitting: Adult Health

## 2019-12-27 DIAGNOSIS — H04123 Dry eye syndrome of bilateral lacrimal glands: Secondary | ICD-10-CM | POA: Diagnosis not present

## 2019-12-27 DIAGNOSIS — E113293 Type 2 diabetes mellitus with mild nonproliferative diabetic retinopathy without macular edema, bilateral: Secondary | ICD-10-CM | POA: Diagnosis not present

## 2019-12-27 DIAGNOSIS — H524 Presbyopia: Secondary | ICD-10-CM | POA: Diagnosis not present

## 2019-12-27 DIAGNOSIS — Z961 Presence of intraocular lens: Secondary | ICD-10-CM | POA: Diagnosis not present

## 2019-12-27 LAB — HM DIABETES EYE EXAM

## 2019-12-28 ENCOUNTER — Encounter: Payer: Self-pay | Admitting: Adult Health

## 2019-12-28 ENCOUNTER — Other Ambulatory Visit: Payer: Self-pay | Admitting: Internal Medicine

## 2020-01-11 ENCOUNTER — Telehealth: Payer: Self-pay | Admitting: Adult Health

## 2020-01-11 NOTE — Telephone Encounter (Signed)
Patient is calling and stated that she has had labs done and wanted to see if her appointment on 12/30 was needed, please advise. CB is [phone]

## 2020-01-12 NOTE — Telephone Encounter (Signed)
Spoke with the pt and advised she keep the appt on 12/30 as this is for her physical.  Patient agreed to arrive at 1:45pm for check in.

## 2020-01-24 ENCOUNTER — Other Ambulatory Visit: Payer: Self-pay

## 2020-01-25 ENCOUNTER — Ambulatory Visit (INDEPENDENT_AMBULATORY_CARE_PROVIDER_SITE_OTHER): Payer: Medicare HMO | Admitting: Adult Health

## 2020-01-25 ENCOUNTER — Encounter: Payer: Self-pay | Admitting: Adult Health

## 2020-01-25 ENCOUNTER — Other Ambulatory Visit: Payer: Self-pay

## 2020-01-25 VITALS — BP 132/86 | HR 74 | Temp 98.6°F | Ht 61.0 in | Wt 130.0 lb

## 2020-01-25 DIAGNOSIS — E1165 Type 2 diabetes mellitus with hyperglycemia: Secondary | ICD-10-CM

## 2020-01-25 DIAGNOSIS — Z794 Long term (current) use of insulin: Secondary | ICD-10-CM

## 2020-01-25 DIAGNOSIS — Z0000 Encounter for general adult medical examination without abnormal findings: Secondary | ICD-10-CM

## 2020-01-25 DIAGNOSIS — I10 Essential (primary) hypertension: Secondary | ICD-10-CM | POA: Diagnosis not present

## 2020-01-25 LAB — COMPREHENSIVE METABOLIC PANEL WITH GFR
ALT: 12 U/L (ref 0–35)
AST: 15 U/L (ref 0–37)
Albumin: 4.2 g/dL (ref 3.5–5.2)
Alkaline Phosphatase: 89 U/L (ref 39–117)
BUN: 23 mg/dL (ref 6–23)
CO2: 24 meq/L (ref 19–32)
Calcium: 10 mg/dL (ref 8.4–10.5)
Chloride: 101 meq/L (ref 96–112)
Creatinine, Ser: 1 mg/dL (ref 0.40–1.20)
GFR: 53.82 mL/min — ABNORMAL LOW (ref >60.00)
Glucose, Bld: 277 mg/dL — ABNORMAL HIGH (ref 70–99)
Potassium: 4 meq/L (ref 3.5–5.1)
Sodium: 136 meq/L (ref 135–145)
Total Bilirubin: 0.6 mg/dL (ref 0.2–1.2)
Total Protein: 7.6 g/dL (ref 6.0–8.3)

## 2020-01-25 LAB — URINALYSIS, ROUTINE W REFLEX MICROSCOPIC
Bilirubin Urine: NEGATIVE
Hgb urine dipstick: NEGATIVE
Ketones, ur: 15 — AB
Leukocytes,Ua: NEGATIVE
Nitrite: NEGATIVE
Specific Gravity, Urine: >=1.03 — AB (ref 1.000–1.030)
Total Protein, Urine: 100 — AB
Urine Glucose: >=1000 — AB
Urobilinogen, UA: 0.2 (ref 0.0–1.0)
pH: 5 (ref 5.0–8.0)

## 2020-01-25 LAB — CBC WITH DIFFERENTIAL/PLATELET
Basophils Absolute: 0 10*3/uL (ref 0.0–0.1)
Basophils Relative: 0.3 % (ref 0.0–3.0)
Eosinophils Absolute: 0 10*3/uL (ref 0.0–0.7)
Eosinophils Relative: 0.3 % (ref 0.0–5.0)
HCT: 38.3 % (ref 36.0–46.0)
Hemoglobin: 12.7 g/dL (ref 12.0–15.0)
Lymphocytes Relative: 18 % (ref 12.0–46.0)
Lymphs Abs: 1.5 10*3/uL (ref 0.7–4.0)
MCHC: 33.1 g/dL (ref 30.0–36.0)
MCV: 82.5 fl (ref 78.0–100.0)
Monocytes Absolute: 0.4 10*3/uL (ref 0.1–1.0)
Monocytes Relative: 4.3 % (ref 3.0–12.0)
Neutro Abs: 6.6 10*3/uL (ref 1.4–7.7)
Neutrophils Relative %: 77.1 % — ABNORMAL HIGH (ref 43.0–77.0)
Platelets: 264 10*3/uL (ref 150.0–400.0)
RBC: 4.64 Mil/uL (ref 3.87–5.11)
RDW: 15 % (ref 11.5–15.5)
WBC: 8.5 10*3/uL (ref 4.0–10.5)

## 2020-01-25 LAB — LIPID PANEL
Cholesterol: 157 mg/dL (ref 0–200)
HDL: 41.3 mg/dL (ref >39.00)
LDL Cholesterol: 88 mg/dL (ref 0–99)
NonHDL: 115.88
Total CHOL/HDL Ratio: 4
Triglycerides: 137 mg/dL (ref 0.0–149.0)
VLDL: 27.4 mg/dL (ref 0.0–40.0)

## 2020-01-25 LAB — TSH: TSH: 1.51 u[IU]/mL (ref 0.35–4.50)

## 2020-01-25 LAB — MICROALBUMIN / CREATININE URINE RATIO
Creatinine,U: 131.7 mg/dL
Microalb Creat Ratio: 78.8 mg/g — ABNORMAL HIGH (ref 0.0–30.0)
Microalb, Ur: 103.8 mg/dL — ABNORMAL HIGH (ref 0.0–1.9)

## 2020-01-25 LAB — HEMOGLOBIN A1C: Hgb A1c MFr Bld: 13.7 % — ABNORMAL HIGH (ref 4.6–6.5)

## 2020-01-25 MED ORDER — AMLODIPINE BESYLATE 10 MG PO TABS: 10.0000 mg | ORAL_TABLET | Freq: Every day | ORAL | 3 refills | Status: DC

## 2020-01-25 NOTE — Patient Instructions (Addendum)
Until you are seen by Dr. Elvera Lennox, increase your insulin R to 20 units three times a day and Levamir to 35 units a day.   I am going to check all of your labs   I will place an order for a mammogram [facility]   No SUGAR, low CARB diet!!!!!!!!!  I want to see you back in three months

## 2020-01-25 NOTE — Progress Notes (Addendum)
Subjective:    Patient ID: Katrina Garrison, female    DOB: 03/16/1940, 79 y.o.   MRN: [id number]  HPI Patient presents for yearly preventative medicine examination. She is a pleasant 79 year old female who  has a past medical history of Anemia, Diabetes mellitus type II (2005), Diverticulosis, GERD (gastroesophageal reflux disease), History of lithotripsy, Hypertension, and Kidney stones.   DM -managed by endocrinology.  Patient has not been seen in over a year.  She has been not compliant with the recommended treatments in the past.  Currently prescribed Levemir 30 units at bedtime and Novolin are 12 to 15 units 15 minutes before meals.  In the past she has had a rash and itching from Toujeo, cannot tolerate Tresiba, stop Metformin for bloody stools, Januvia caused chest pain.  He reports that she has not been eating healthy nor exercising.  Not checking her blood sugars on a consistent basis but when she does readings are 200 or greater.  Today she reports polyuria as well as polydipsia.  Denies blurred vision.  Hypertension -was last seen on 11/03/2019 at which time she was not taking her blood pressure medication.  She is currently prescribed Norvasc 10 mg.  She stopped this because it was "making her feel weird.  She does have a long history of noncompliance with medications related to blood pressure control as well as follow-up instructions.  He was placed back on this medication and reports that she has been taking it every day without incident.  BP Readings from Last 3 Encounters:  01/25/20 132/86  11/03/19 (!) 142/98  05/05/18 (!) 146/78    All immunizations and health maintenance protocols were reviewed with the patient and needed orders were placed.  Appropriate screening laboratory values were ordered for the patient including screening of hyperlipidemia, renal function and hepatic function.   Medication reconciliation,  past medical history, social history, problem list and  allergies were reviewed in detail with the patient  Goals were established with regard to weight loss, exercise, and  diet in compliance with medications  End of life planning was discussed.  She has not scheduled her mammogram yet this year with plans to do so.   Review of Systems  Constitutional: Negative.   HENT: Negative.   Eyes: Negative.   Respiratory: Negative.   Cardiovascular: Negative.   Gastrointestinal: Negative.   Endocrine: Positive for polydipsia and polyuria.  Genitourinary: Positive for frequency and urgency.  Musculoskeletal: Negative.   Skin: Negative.   Allergic/Immunologic: Negative.   Neurological: Negative.   Hematological: Negative.   Psychiatric/Behavioral: Negative.      Past Medical History:  Diagnosis Date  . Anemia   . Diabetes mellitus type II 2005  . Diverticulosis   . GERD (gastroesophageal reflux disease)   . History of lithotripsy    x3 for kidney stones  . Hypertension   . Kidney stones     Social History   Socioeconomic History  . Marital status: Divorced    Spouse name: Not on file  . Number of children: Not on file  . Years of education: Not on file  . Highest education level: Not on file  Occupational History  . Occupation: Retired  Tobacco Use  . Smoking status: Former Smoker    Years: 1.50    Quit date: 01/28/1985    Years since quitting: 35.0  . Smokeless tobacco: Never Used  Substance and Sexual Activity  . Alcohol use: No  . Drug use: No  . Sexual  activity: Not on file  Other Topics Concern  . Not on file  Social History Narrative  . Not on file   Social Determinants of Health   Financial Resource Strain: Low Risk   . Difficulty of Paying Living Expenses: Not hard at all  Food Insecurity: No Food Insecurity  . Worried About Programme researcher, broadcasting/film/video in the Last Year: Never true  . Ran Out of Food in the Last Year: Never true  Transportation Needs: No Transportation Needs  . Lack of Transportation (Medical): No   . Lack of Transportation (Non-Medical): No  Physical Activity: Insufficiently Active  . Days of Exercise per Week: 3 days  . Minutes of Exercise per Session: 30 min  Stress: No Stress Concern Present  . Feeling of Stress : Not at all  Social Connections: Moderately Integrated  . Frequency of Communication with Friends and Family: More than three times a week  . Frequency of Social Gatherings with Friends and Family: More than three times a week  . Attends Religious Services: More than 4 times per year  . Active Member of Clubs or Organizations: Yes  . Attends Banker Meetings: More than 4 times per year  . Marital Status: Widowed  Intimate Partner Violence: Not At Risk  . Fear of Current or Ex-Partner: No  . Emotionally Abused: No  . Physically Abused: No  . Sexually Abused: No    Past Surgical History:  Procedure Laterality Date  . ABDOMINAL HYSTERECTOMY    . childbirth x 2    . EYE SURGERY     cataract    Family History  Problem Relation Age of Onset  . Thyroid cancer Mother   . Diabetes Mother   . Hypertension Mother   . Alcohol abuse Father   . Hypertension Father   . Multiple sclerosis Daughter   . Multiple sclerosis Daughter   . Multiple sclerosis Sister     Allergies  Allergen Reactions  . Hydrochlorothiazide   . Lisinopril Cough  . Losartan Cough  . Metformin And Related Diarrhea  . Metoprolol Swelling    Leg cramping   . Sulfamethoxazole     REACTION: "comatose"  . Toujeo Solostar [Insulin Glargine] Rash    Current Outpatient Medications on File Prior to Visit  Medication Sig Dispense Refill  . amLODipine (NORVASC) 10 MG tablet Take 1 tablet (10 mg total) by mouth daily. 90 tablet 0  . aspirin 81 MG tablet Take 81 mg by mouth daily.    Marland Kitchen glucose blood (ONETOUCH VERIO) test strip Use as instructed to check blood sugar 3 times a day 300 each 12  . ibuprofen (ADVIL,MOTRIN) 200 MG tablet Take 200 mg by mouth every 6 (six) hours as needed.  Reported on 03/22/2015    . Insulin Pen Needle (RELION PEN NEEDLES) 31G X 6 MM MISC USE 1  THREE TIMES DAILY 200 each 11  . insulin regular (NOVOLIN R) 100 units/mL injection Inject 15 Units into the skin 3 (three) times daily before meals. Pt taking 15 units daily.    Marland Kitchen LEVEMIR FLEXTOUCH 100 UNIT/ML FlexPen INJECT 30 UNITS SUBCUTANEOUSLY ONCE DAILY 15 mL 0   No current facility-administered medications on file prior to visit.    BP (!) 186/92   Pulse (!) 103   Temp 98.6 F (37 C)   Ht 5\' 1"  (1.549 m)   Wt 130 lb (59 kg)   SpO2 96%   BMI 24.56 kg/m  Objective:   Physical Exam Vitals and nursing note reviewed.  Constitutional:      General: She is not in acute distress.    Appearance: Normal appearance. She is well-developed. She is not ill-appearing.  HENT:     Head: Normocephalic and atraumatic.     Right Ear: Tympanic membrane, ear canal and external ear normal. There is no impacted cerumen.     Left Ear: Tympanic membrane, ear canal and external ear normal. There is no impacted cerumen.     Nose: Nose normal. No congestion or rhinorrhea.     Mouth/Throat:     Mouth: Mucous membranes are moist.     Pharynx: Oropharynx is clear. No oropharyngeal exudate or posterior oropharyngeal erythema.  Eyes:     General:        Right eye: No discharge.        Left eye: No discharge.     Extraocular Movements: Extraocular movements intact.     Conjunctiva/sclera: Conjunctivae normal.     Pupils: Pupils are equal, round, and reactive to light.  Neck:     Thyroid: No thyromegaly.     Vascular: No carotid bruit.     Trachea: No tracheal deviation.  Cardiovascular:     Rate and Rhythm: Normal rate and regular rhythm.     Pulses: Normal pulses.     Heart sounds: Normal heart sounds. No murmur heard. No friction rub. No gallop.   Pulmonary:     Effort: Pulmonary effort is normal. No respiratory distress.     Breath sounds: Normal breath sounds. No stridor. No wheezing, rhonchi  or rales.  Chest:     Chest wall: No tenderness.  Abdominal:     General: Abdomen is flat. Bowel sounds are normal. There is no distension.     Palpations: Abdomen is soft. There is no mass.     Tenderness: There is no abdominal tenderness. There is no right CVA tenderness, left CVA tenderness, guarding or rebound.     Hernia: No hernia is present.  Musculoskeletal:        General: No swelling, tenderness, deformity or signs of injury. Normal range of motion.     Cervical back: Normal range of motion and neck supple.     Right lower leg: No edema.     Left lower leg: No edema.  Lymphadenopathy:     Cervical: No cervical adenopathy.  Skin:    General: Skin is warm and dry.     Coloration: Skin is not jaundiced or pale.     Findings: No bruising, erythema, lesion or rash.  Neurological:     General: No focal deficit present.     Mental Status: She is alert and oriented to person, place, and time.     Cranial Nerves: No cranial nerve deficit.     Sensory: No sensory deficit.     Motor: No weakness.     Coordination: Coordination normal.     Gait: Gait normal.     Deep Tendon Reflexes: Reflexes normal.  Psychiatric:        Mood and Affect: Mood normal.        Behavior: Behavior normal.        Thought Content: Thought content normal.        Judgment: Judgment normal.       Assessment & Plan:  1. Routine general medical examination at a health care facility - Follow up in one year or sooner if needed - CBC with Differential/Platelet;  Future - Comprehensive metabolic panel; Future - Hemoglobin A1c; Future - Lipid panel; Future - Microalbumin/Creatinine Ratio, Urine; Future - TSH; Future - Urinalysis; Future - TSH - Microalbumin/Creatinine Ratio, Urine - Lipid panel - Hemoglobin A1c - Comprehensive metabolic panel - CBC with Differential/Platelet - Urinalysis - Urinalysis, Routine w reflex microscopic  2. Uncontrolled type 2 diabetes mellitus with hyperglycemia, with  long-term current use of insulin (HCC) -Long discussion today about controlling her diabetes and following up with endocrinology on a routine basis.  We discussed endorgan damage.  He needs to take better care of herself and this includes taking her medications routinely.  She was advised to call today and schedule an appointment with endocrinology - In the meantime will have her increase Levamir to 35 units daily and Insulin R to 20 units TID  - CBC with Differential/Platelet; Future - Comprehensive metabolic panel; Future - Hemoglobin A1c; Future - Lipid panel; Future - Microalbumin/Creatinine Ratio, Urine; Future - TSH; Future - Urinalysis; Future - TSH - Microalbumin/Creatinine Ratio, Urine - Lipid panel - Hemoglobin A1c - Comprehensive metabolic panel - CBC with Differential/Platelet - Urinalysis - Urinalysis, Routine w reflex microscopic  3. Essential hypertension -Blood pressure better controlled.  Continue with Norvasc 10 mg - amLODipine (NORVASC) 10 MG tablet; Take 1 tablet (10 mg total) by mouth daily.  Dispense: 90 tablet; Refill: 3 - CBC with Differential/Platelet; Future - Comprehensive metabolic panel; Future - Hemoglobin A1c; Future - Lipid panel; Future - Microalbumin/Creatinine Ratio, Urine; Future - TSH; Future - Urinalysis; Future - TSH - Microalbumin/Creatinine Ratio, Urine - Lipid panel - Hemoglobin A1c - Comprehensive metabolic panel - CBC with Differential/Platelet - Urinalysis  [name], NP

## 2020-01-29 ENCOUNTER — Other Ambulatory Visit: Payer: Self-pay | Admitting: Internal Medicine

## 2020-01-29 NOTE — Telephone Encounter (Signed)
NEEDS APPT for further refills. 

## 2020-01-30 ENCOUNTER — Telehealth: Payer: Self-pay | Admitting: Adult Health

## 2020-01-30 NOTE — Telephone Encounter (Signed)
To patient informed her of her labs, A1c has increased to 13.7.  She has increased her home insulin doses as we discussed and has less polyuria and polydipsia today.  She has not scheduled appointment with endocrinology yet but plans to call again today to try to get scheduled.  Did notify her of her decreased kidney function likely from uncontrolled diabetes.  We will continue to monitor

## 2020-01-30 NOTE — Telephone Encounter (Signed)
Patient is calling and wanted to let provider know that she has an appointment scheduled with the Endocrinologist on 1/6 and she is going to be more compliant with his recommendations.

## 2020-02-01 ENCOUNTER — Encounter: Payer: Self-pay | Admitting: Internal Medicine

## 2020-02-01 ENCOUNTER — Ambulatory Visit: Payer: Medicare HMO | Admitting: Internal Medicine

## 2020-02-01 ENCOUNTER — Other Ambulatory Visit: Payer: Self-pay

## 2020-02-01 VITALS — BP 150/100 | HR 68 | Ht 61.0 in | Wt 134.2 lb

## 2020-02-01 DIAGNOSIS — E785 Hyperlipidemia, unspecified: Secondary | ICD-10-CM | POA: Diagnosis not present

## 2020-02-01 DIAGNOSIS — Z794 Long term (current) use of insulin: Secondary | ICD-10-CM

## 2020-02-01 DIAGNOSIS — E1165 Type 2 diabetes mellitus with hyperglycemia: Secondary | ICD-10-CM | POA: Diagnosis not present

## 2020-02-01 MED ORDER — ACCU-CHEK SOFTCLIX LANCETS MISC: 12 refills | Status: DC

## 2020-02-01 MED ORDER — GLUCOSE BLOOD VI STRP: ORAL_STRIP | 12 refills | Status: DC

## 2020-02-01 NOTE — Progress Notes (Signed)
Patient ID: Katrina Garrison, female   DOB: 1941-01-25, 80 y.o.   MRN: [id number]  This visit occurred during the SARS-CoV-2 public health emergency.  Safety protocols were in place, including screening questions prior to the visit, additional usage of staff PPE, and extensive cleaning of exam room while observing appropriate contact time as indicated for disinfecting solutions.   HPI: Katrina Garrison is a 80 y.o.-year-old female, returning for f/u for DM2, dx 2009, insulin-dependent since 2012-13, uncontrolled, with complications (DR, + hospitalization for diabetic coma x 2, PN). Last visit 1 year and 1 month ago (telephone). She has [employer].  Patient is not usually compliant with the recommended treatment whenever she presented for follow-up in the past, she was either off long-acting insulin or of short acting insulin for various reasons.  At last visit, she was finally on both for but was taking the regular insulin inconsistently, 15 minutes before meals.  I advised her to move these 30 minutes before meals.    Unfortunately, she was lost for follow-up afterwards for more than a year she was not checking sugars in this interval, despite polyuria and polydipsia.  Most recent HbA1c obtained by PCP was 13.7%.  Reviewed HbA1c levels: Lab Results  Component Value Date   HGBA1C 13.7 (H) 01/25/2020   HGBA1C 11.0 (A) 10/25/2017   HGBA1C 7.7 11/19/2016  Anti-islet cell and anti-GAD antibodies were undetectable.  She is on: - Levemir 30 >> 32-34 >> 35 units at bedtime  Novolin Regular insulin 11-04-08 units without relationship to the meals -added 05/2018 >> 12-15 >> 20 units 15 minutes before meals She got a rash + itching from Toujeo.  She also could not tolerate [place]. Stopped Metformin in 11/2013 for bloody stools. She had severe diarrhea from metformin before. Stopped Januvia 100 mg daily >> b/c CP. Pt was in the past on a regimen of Humalog 75/25 pen - 50 units daily once a day -  but was forgetting the insulin 1/7 days. She was taking the insulin at bedtime >> lows at night and highs, mostly in the 200s but also some in the 300s in am. She was also on Glipizide 10 bid in the past. A freestyle libre CGM was not covered by her insurance.  She has not been checking sugars lately. - am:  171-243, 252 w/o Humalog >> 113-146 >> 148-178 >> n/c - 2 hours after breakfast: 126-140  >> n/c >> 73-151 >> n/c - before lunch: 190-200 >> 133-148 >> 74, 102-167 >> n/c  - 2h after lunch: 138-171 >> 71-168 >> 72-133 >> n/c - before dinner:  72-142 >> ? >> 132-147 >> 120-134 >> n/c - after dinner: 133-194 >> 91-135 >> 102-141 >> ? - Bedtime: 101-150 >> n/c >> 162-177 >> 102-193 (snack) >> n/c Lows: 57, but feels low at night (not checking) ...>> 113 >> 74 >> ?; she has hypoglycemia awareness in the 60s. Highest sugar 252 >> 177 >> 200s >> ?.  -+ Mild CKD last BUN/Cr: Lab Results  Component Value Date   BUN 23 01/25/2020   CREATININE 1.00 01/25/2020  On losartan -No HL: Lab Results  Component Value Date   CHOL 157 01/25/2020   HDL 41.30 01/25/2020   LDLCALC 88 01/25/2020   TRIG 137.0 01/25/2020   CHOLHDL 4 01/25/2020  She is not on a statin. - Pt's last eye exam was in 12/2019: + DR.  She had bilateral cataract surgery 2015.  - + numbness and tingling in her legs -  Dr. Angelia Mould, podiatry.   She also has a history of asthma-on prednisone tapers; also HTN (PCP recommended amlodipine, did not start yet);  h/o nephrolithiasis, s/p lithotripsy.  ROS: Constitutional: no weight gain/no weight loss, no fatigue, no subjective hyperthermia, no subjective hypothermia Eyes: no blurry vision, no xerophthalmia ENT: no sore throat, no nodules palpated in neck, no dysphagia, no odynophagia, no hoarseness Cardiovascular: no CP/no SOB/no palpitations/no leg swelling Respiratory: no cough/no SOB/no wheezing Gastrointestinal: no N/no V/no D/no C/no acid reflux Musculoskeletal: no muscle  aches/no joint aches Skin: no rashes, no hair loss Neurological: no tremors/no numbness/no tingling/no dizziness  I reviewed pt's medications, allergies, PMH, social hx, family hx, and changes were documented in the history of present illness. Otherwise, unchanged from my initial visit note.  Past Medical History:  Diagnosis Date  . Anemia   . Diabetes mellitus type II 2005  . Diverticulosis   . GERD (gastroesophageal reflux disease)   . History of lithotripsy    x3 for kidney stones  . Hypertension   . Kidney stones    Past Surgical History:  Procedure Laterality Date  . ABDOMINAL HYSTERECTOMY    . childbirth x 2    . EYE SURGERY     cataract   Social History   Socioeconomic History  . Marital status: Divorced    Spouse name: Not on file  . Number of children: Not on file  . Years of education: Not on file  . Highest education level: Not on file  Occupational History  . Occupation: Retired  Tobacco Use  . Smoking status: Former Smoker    Years: 1.50    Quit date: 01/28/1985    Years since quitting: 35.0  . Smokeless tobacco: Never Used  Substance and Sexual Activity  . Alcohol use: No  . Drug use: No  . Sexual activity: Not on file  Other Topics Concern  . Not on file  Social History Narrative  . Not on file   Social Determinants of Health   Financial Resource Strain: Low Risk   . Difficulty of Paying Living Expenses: Not hard at all  Food Insecurity: No Food Insecurity  . Worried About Programme researcher, broadcasting/film/video in the Last Year: Never true  . Ran Out of Food in the Last Year: Never true  Transportation Needs: No Transportation Needs  . Lack of Transportation (Medical): No  . Lack of Transportation (Non-Medical): No  Physical Activity: Insufficiently Active  . Days of Exercise per Week: 3 days  . Minutes of Exercise per Session: 30 min  Stress: No Stress Concern Present  . Feeling of Stress : Not at all  Social Connections: Moderately Integrated  . Frequency  of Communication with Friends and Family: More than three times a week  . Frequency of Social Gatherings with Friends and Family: More than three times a week  . Attends Religious Services: More than 4 times per year  . Active Member of Clubs or Organizations: Yes  . Attends Banker Meetings: More than 4 times per year  . Marital Status: Widowed  Intimate Partner Violence: Not At Risk  . Fear of Current or Ex-Partner: No  . Emotionally Abused: No  . Physically Abused: No  . Sexually Abused: No   Current Outpatient Medications on File Prior to Visit  Medication Sig Dispense Refill  . amLODipine (NORVASC) 10 MG tablet Take 1 tablet (10 mg total) by mouth daily. 90 tablet 3  . aspirin 81 MG tablet Take 81  mg by mouth daily.    Marland Kitchen glucose blood (ONETOUCH VERIO) test strip Use as instructed to check blood sugar 3 times a day 300 each 12  . ibuprofen (ADVIL,MOTRIN) 200 MG tablet Take 200 mg by mouth every 6 (six) hours as needed. Reported on 03/22/2015    . Insulin Pen Needle (RELION PEN NEEDLES) 31G X 6 MM MISC USE 1  THREE TIMES DAILY 200 each 11  . insulin regular (NOVOLIN R) 100 units/mL injection Inject 15 Units into the skin 3 (three) times daily before meals. Pt taking 15 units daily.    Marland Kitchen LEVEMIR FLEXTOUCH 100 UNIT/ML FlexPen INJECT 30 UNITS SUBCUTANEOUSLY ONCE DAILY 15 mL 0   No current facility-administered medications on file prior to visit.   Allergies  Allergen Reactions  . Hydrochlorothiazide   . Lisinopril Cough  . Losartan Cough  . Metformin And Related Diarrhea  . Metoprolol Swelling    Leg cramping   . Sulfamethoxazole     REACTION: "comatose"  . Toujeo Solostar [Insulin Glargine] Rash   Family History  Problem Relation Age of Onset  . Thyroid cancer Mother   . Diabetes Mother   . Hypertension Mother   . Alcohol abuse Father   . Hypertension Father   . Multiple sclerosis Daughter   . Multiple sclerosis Daughter   . Multiple sclerosis Sister     PE: BP (!) 150/100   Pulse 68   Ht 5\' 1"  (1.549 m)   Wt 134 lb 3.2 oz (60.9 kg)   SpO2 98%   BMI 25.36 kg/m  Body mass index is 25.36 kg/m. Wt Readings from Last 3 Encounters:  02/01/20 134 lb 3.2 oz (60.9 kg)  01/25/20 130 lb (59 kg)  11/03/19 133 lb 3.2 oz (60.4 kg)   Constitutional: normal weight, in NAD Eyes: PERRLA, EOMI, no exophthalmos ENT: moist mucous membranes, no thyromegaly, no cervical lymphadenopathy Cardiovascular: RRR, No MRG Respiratory: CTA B Gastrointestinal: abdomen soft, NT, ND, BS+ Musculoskeletal: no deformities, strength intact in all 4 Skin: moist, warm, no rashes Neurological: no tremor with outstretched hands, DTR normal in all 4  ASSESSMENT: 1. DM2, insulin-dependent, uncontrolled, with complications - DR - PN  PLAN:  1. Patient with uncontrolled, insulin-dependent diabetes, noncompliant with the medication regimen and visits, with very poor control.  Despite repeated instructions, she did not use mealtime insulin as advised in the past and only takes it sporadically.  She now returns after more than 1 year from the previous visit, which was a telephone visit.  Her diabetes control is very poor and her HbA1c was 13.7% several days ago.   -At this visit, we discussed that, since she was unfortunately not able to follow the recommended regimen, check blood sugars, and come to the appointments, despite my best efforts in the last 7 years, I will unfortunately need to discharge her from my practice.  I advised her to schedule an appointment with another endocrinologist and hopefully will start taking her diabetes more seriously in the future. -PCP already increased her Levemir and regular insulin 2 days ago.  I would not suggest to add a GLP-1 receptor agonist since she had an elevated lipase of 83 on 11/03/2019.  If she starts taking her insulin consistently and also, importantly, to check blood sugars, she definitely has the potential to improve her  diabetes so for now, I would not suggest major changes in her regimen but I did advise her to take the insulin 30 minutes before the meals (she  is taking insulin 15 minutes before), and also to vary the dose depending on the blood sugars before meals and the size of her meal. -At this visit, I suggested a CGM and she thinks that this may be covered with her new insurance [facility]).  I gave her a list of suppliers and I advised her to have them send refill request to me for the freestyle libre CGM.  In the meantime, we gave her an Accu-Chek guide me meter and sent a prescription for the strips and lancets to her pharmacy. - I advised her to:  Patient Instructions  Please continue: - Levemir 35 units at bedtime  Use: - ReliOn Regular insulin 17-20 units 30 minutes before meals  The most common suppliers for the continuous glucose monitor (FreeStyle [place]) are:  [employer] [phone], extension [phone]  -[employer] [phone]  [employer] [phone]  [facility] [phone]    Please establish care with another endocrinologist.  - advised to check sugars at different times of the day - 4x a day, rotating check times - advised for yearly eye exams >> she is UTD - I recommended to establish care with another endocrinologist and I gave her several names.   - Total time spent for the visit: 25 minutes, in obtaining medical information from the chart, reviewing her  previous labs, previous visit with PCP, and diabetes treatment, reviewing her symptoms, counseling her about her condition (please see the discussed topics above), and developing a plan to further treat it.; she had a number of questions which I addressed.   Carlus Pavlov, MD PhD [facility] Endocrinology

## 2020-02-01 NOTE — Patient Instructions (Addendum)
Please continue: - Levemir 35 units at bedtime  Use: - ReliOn Regular insulin 17-20 units 30 minutes before meals  The most common suppliers for the continuous glucose monitor (FreeStyle [place]) are:  [employer] [phone], extension [phone]  -[employer] [phone]  [employer] [phone]  [facility] [phone]    Please establish care with another endocrinologist.

## 2020-02-02 NOTE — Telephone Encounter (Signed)
Patient is calling and wanted to let provider know that the visit she had yesterday with Dr. Lafe Garin didn't go well and she was dropped as a patient. Pt is asking for a call back from the provider, please advise. CB is [phone]

## 2020-02-05 ENCOUNTER — Telehealth: Payer: Self-pay | Admitting: Adult Health

## 2020-02-05 NOTE — Telephone Encounter (Signed)
Amy Customer Service Rep w/[employer] called in stating that she is not able to find [facility] in their system but was able to find Dr. Caryl Never and will put him in the system and ask the pt questions so that the pt is able to continue to be seen by Kandee Keen and his name will appear on the pts card.

## 2020-02-20 ENCOUNTER — Telehealth: Payer: Self-pay | Admitting: Adult Health

## 2020-02-20 NOTE — Telephone Encounter (Signed)
FYI:  Pt is calling in stating that she is no longer going to the Endocrinologist and would like to see if she can get some help from [facility].  Pt stated that she has stopped taking the NOVOLIN R (3 times a day 20 MG) due to the severe side effects (itching all over her body,stool very abnormal, feet and legs swelling neurol).  Pt also stated that on 02/19/2020 her Bp was 146/70 and Blood sugars was 158, 02/20/2020 Bp 146/80 Blood sugar 176 (those readings were in the morning) and 156/81 at 3:42.  Pt has made a virtual appointment on 02/21/2020 at 10:30 with [facility].  Pt is pleading for assistance to help her with her Bp and medications.

## 2020-02-20 NOTE — Telephone Encounter (Signed)
FYI

## 2020-02-21 ENCOUNTER — Other Ambulatory Visit: Payer: Self-pay

## 2020-02-21 ENCOUNTER — Telehealth (INDEPENDENT_AMBULATORY_CARE_PROVIDER_SITE_OTHER): Payer: Medicare HMO | Admitting: Adult Health

## 2020-02-21 ENCOUNTER — Encounter: Payer: Self-pay | Admitting: Adult Health

## 2020-02-21 VITALS — BP 142/84 | HR 93 | Wt 134.0 lb

## 2020-02-21 DIAGNOSIS — Z794 Long term (current) use of insulin: Secondary | ICD-10-CM | POA: Diagnosis not present

## 2020-02-21 DIAGNOSIS — E1165 Type 2 diabetes mellitus with hyperglycemia: Secondary | ICD-10-CM | POA: Diagnosis not present

## 2020-02-21 DIAGNOSIS — I10 Essential (primary) hypertension: Secondary | ICD-10-CM | POA: Diagnosis not present

## 2020-02-21 MED ORDER — OLMESARTAN MEDOXOMIL 20 MG PO TABS
10.0000 mg | ORAL_TABLET | Freq: Every day | ORAL | 2 refills | Status: DC
Start: 2020-02-21 — End: 2020-04-24

## 2020-02-21 MED ORDER — GLIPIZIDE ER 5 MG PO TB24: 5.0000 mg | ORAL_TABLET | Freq: Every day | ORAL | 0 refills | Status: DC

## 2020-02-21 NOTE — Progress Notes (Signed)
Virtual Visit via Telephone Note  I connected with Katrina Garrison on 02/21/20 at 10:30 AM EST by telephone and verified that I am speaking with the correct person using two identifiers.   I discussed the limitations, risks, security and privacy concerns of performing an evaluation and management service by telephone and the availability of in person appointments. I also discussed with the patient that there may be a patient responsible charge related to this service. The patient expressed understanding and agreed to proceed.  Location patient: home Location provider: work or home office Participants present for the call: patient, provider Patient did not have a visit in the prior 7 days to address this/these issue(s).   History of Present Illness: 80 year old female who is being evaluated today for hypertension and diabetes mellitus.  DM -she was discharged from endocrinology for not following up as directed over the last 7 years.  Currently prescribed Levemir 35 units daily and insulin are 20 units 3 times daily.   Past she has been intolerant to the following diabetic medication -Toujeo -rash plus itching - Metformin - blood stools and diarrhea  -Januvia-chest pain  In the past she was also on Humalog 75/25-50 units daily but was not taking her insulin as directed.  Today she reports that she has been working on changing the way she takes care of herself.  She is taking her Levemir 35 units every day but had to stop her regular insulin due to changes and swelling in her lower extremities.  She stopped this about a week ago and her symptoms have nearly resolved.  Fortunately her blood sugars, although better have been in the 150s to 190's consistently.  Lab Results  Component Value Date   HGBA1C 13.7 (H) 01/25/2020     HTN  -Currently prescribed Norvasc 10 mg.  She is taking her blood pressure medication daily and checking her pressures 4 times a day.  Over the last 7 to 10 days  her blood pressures have been between 142 and 156 over 80s to 90s.  In the past she has developed a cough with lisinopril and losartan.  She has been intolerant( unknown reaction) HCTZ.   Observations/Objective: Patient sounds cheerful and well on the phone. I do not appreciate any SOB. Speech and thought processing are grossly intact. Patient reported vitals:  Assessment and Plan:  1. Uncontrolled type 2 diabetes mellitus with hyperglycemia, with long-term current use of insulin (HCC) -We will add glipizide.  DC Novolin R. - glipiZIDE (GLUCOTROL XL) 5 MG 24 hr tablet; Take 1 tablet (5 mg total) by mouth daily with breakfast.  Dispense: 90 tablet; Refill: 0  2. Essential hypertension -We will add Benicar, this has less side effect of coughing as other ARB's.  We will follow-up with me in 2 weeks for further blood pressure readings - olmesartan (BENICAR) 20 MG tablet; Take 0.5 tablets (10 mg total) by mouth daily.  Dispense: 15 tablet; Refill: 2   Follow Up Instructions:  I did not refer this patient for an OV in the next 24 hours for this/these issue(s).  I discussed the assessment and treatment plan with the patient. The patient was provided an opportunity to ask questions and all were answered. The patient agreed with the plan and demonstrated an understanding of the instructions.   The patient was advised to call back or seek an in-person evaluation if the symptoms worsen or if the condition fails to improve as anticipated.  I provided 23 minutes of non-face-to-face time  during this encounter.   Dorothyann Peng, NP

## 2020-03-04 ENCOUNTER — Telehealth: Payer: Self-pay | Admitting: Adult Health

## 2020-03-04 NOTE — Telephone Encounter (Signed)
error 

## 2020-03-05 ENCOUNTER — Telehealth: Payer: Self-pay | Admitting: Adult Health

## 2020-03-05 NOTE — Telephone Encounter (Signed)
Patient daughter Marcelino Duster is calling wanting to know what medication her mom should be taking and to make sure she is taking it properly.  Patient daughter states her mom is somewhat confused and is trying to help her. Please advise [phone]

## 2020-03-06 NOTE — Telephone Encounter (Signed)
Spoke to [place] (DPR on file) and went over medication list.  Pt will take amLODipine (NORVASC) 10 MG tablet and olmesartan (BENICAR) 20 MG tablet for her blood pressure.  She will take glipiZIDE (GLUCOTROL XL) 5 MG 24 hr tablet and LEVEMIR FLEXTOUCH 100 UNIT/ML FlexPen for her diabetes.  Notified her that the Novolin had been discontinued.  Nothing further needed.

## 2020-04-23 ENCOUNTER — Other Ambulatory Visit: Payer: Self-pay

## 2020-04-24 ENCOUNTER — Ambulatory Visit (INDEPENDENT_AMBULATORY_CARE_PROVIDER_SITE_OTHER): Payer: Medicare HMO | Admitting: Adult Health

## 2020-04-24 ENCOUNTER — Other Ambulatory Visit: Payer: Self-pay | Admitting: Adult Health

## 2020-04-24 VITALS — BP 150/90 | HR 73 | Temp 98.2°F | Resp 18 | Ht 61.0 in | Wt 135.0 lb

## 2020-04-24 DIAGNOSIS — E1165 Type 2 diabetes mellitus with hyperglycemia: Secondary | ICD-10-CM | POA: Diagnosis not present

## 2020-04-24 DIAGNOSIS — I10 Essential (primary) hypertension: Secondary | ICD-10-CM

## 2020-04-24 DIAGNOSIS — Z794 Long term (current) use of insulin: Secondary | ICD-10-CM

## 2020-04-24 LAB — BASIC METABOLIC PANEL
BUN: 22 mg/dL (ref 6–23)
CO2: 25 mEq/L (ref 19–32)
Calcium: 10 mg/dL (ref 8.4–10.5)
Chloride: 101 mEq/L (ref 96–112)
Creatinine, Ser: 1.02 mg/dL (ref 0.40–1.20)
GFR: 52.47 mL/min — ABNORMAL LOW (ref >60.00)
Glucose, Bld: 344 mg/dL — ABNORMAL HIGH (ref 70–99)
Potassium: 4.3 mEq/L (ref 3.5–5.1)
Sodium: 137 mEq/L (ref 135–145)

## 2020-04-24 LAB — HEMOGLOBIN A1C: Hgb A1c MFr Bld: 10 % — ABNORMAL HIGH (ref 4.6–6.5)

## 2020-04-24 MED ORDER — FREESTYLE SYSTEM KIT: 1.0000 | PACK | 0 refills | Status: DC | PRN

## 2020-04-24 MED ORDER — FREESTYLE LITE TEST VI STRP: ORAL_STRIP | 3 refills | Status: DC

## 2020-04-24 MED ORDER — OLMESARTAN MEDOXOMIL 20 MG PO TABS: 20.0000 mg | ORAL_TABLET | Freq: Every day | ORAL | 0 refills | Status: DC

## 2020-04-24 MED ORDER — FREESTYLE LANCETS MISC: 3 refills | Status: DC

## 2020-04-24 NOTE — Progress Notes (Signed)
Subjective:    Patient ID: Katrina Garrison, female    DOB: 14-Oct-1940, 80 y.o.   MRN: [id number]  HPI 80 year old female who  has a past medical history of Anemia, Diabetes mellitus type II (2005), Diverticulosis, GERD (gastroesophageal reflux disease), History of lithotripsy, Hypertension, and Kidney stones.  She presents to the office today for follow up regarding DM and HTN   DM -he was discharged from endocrinology for not following up as directed over the last 7 years.  She is currently prescribed Levemir 35 units daily and glipizide 5 mg ER daily.  During her last visit in January we discontinued Novolin R as this was causing her lower extremities to swell.  After stopping the Novolin R her symptoms resolved.   Lab Results  Component Value Date   HGBA1C 13.7 (H) 01/25/2020   In the past she has been intolerant to the following diabetic medication -Toujeo -rash plus itching - Metformin - blood stools and diarrhea  -Januvia-chest pain  Today she reports that she has been taking her medications as directed and has been working to change her diet.  Her blood sugars have been ranging mostly in the 150s to 170s.  She denies episodes of hypoglycemia.  HTN -Currently prescribed Norvasc 10 mg Benicar 10 mg daily. She has not developed side effects of Benicar  The past she has been intolerant to the following hypertensive medications - Lisinopril - cough  - Losartan - cough  - HCTZ - unknown reaction  She has been monitoring her blood pressure at home with readings up and down in the 130s to 190s over 80s to 100.  In the office today got initial blood pressure of 162/98 and a repeat blood pressure of 150/90.  On her blood pressure cuff her reading was 198/112.  BP Readings from Last 3 Encounters:  04/24/20 (!) 150/90  02/21/20 (!) 142/84  02/01/20 (!) 150/100   Review of Systems See HPI   Past Medical History:  Diagnosis Date  . Anemia   . Diabetes mellitus type II 2005  .  Diverticulosis   . GERD (gastroesophageal reflux disease)   . History of lithotripsy    x3 for kidney stones  . Hypertension   . Kidney stones     Social History   Socioeconomic History  . Marital status: Divorced    Spouse name: Not on file  . Number of children: Not on file  . Years of education: Not on file  . Highest education level: Not on file  Occupational History  . Occupation: Retired  Tobacco Use  . Smoking status: Former Smoker    Years: 1.50    Quit date: 01/28/1985    Years since quitting: 35.2  . Smokeless tobacco: Never Used  Substance and Sexual Activity  . Alcohol use: No  . Drug use: No  . Sexual activity: Not on file  Other Topics Concern  . Not on file  Social History Narrative  . Not on file   Social Determinants of Health   Financial Resource Strain: Low Risk   . Difficulty of Paying Living Expenses: Not hard at all  Food Insecurity: No Food Insecurity  . Worried About Charity fundraiser in the Last Year: Never true  . Ran Out of Food in the Last Year: Never true  Transportation Needs: No Transportation Needs  . Lack of Transportation (Medical): No  . Lack of Transportation (Non-Medical): No  Physical Activity: Insufficiently Active  . Days of  Exercise per Week: 3 days  . Minutes of Exercise per Session: 30 min  Stress: No Stress Concern Present  . Feeling of Stress : Not at all  Social Connections: Moderately Integrated  . Frequency of Communication with Friends and Family: More than three times a week  . Frequency of Social Gatherings with Friends and Family: More than three times a week  . Attends Religious Services: More than 4 times per year  . Active Member of Clubs or Organizations: Yes  . Attends Archivist Meetings: More than 4 times per year  . Marital Status: Widowed  Intimate Partner Violence: Not At Risk  . Fear of Current or Ex-Partner: No  . Emotionally Abused: No  . Physically Abused: No  . Sexually Abused: No     Past Surgical History:  Procedure Laterality Date  . ABDOMINAL HYSTERECTOMY    . childbirth x 2    . EYE SURGERY     cataract    Family History  Problem Relation Age of Onset  . Thyroid cancer Mother   . Diabetes Mother   . Hypertension Mother   . Alcohol abuse Father   . Hypertension Father   . Multiple sclerosis Daughter   . Multiple sclerosis Daughter   . Multiple sclerosis Sister     Allergies  Allergen Reactions  . Hydrochlorothiazide   . Lisinopril Cough  . Losartan Cough  . Metformin And Related Diarrhea  . Metoprolol Swelling    Leg cramping   . Novolin R [Insulin] Itching  . Sulfamethoxazole     REACTION: "comatose"  . Toujeo Solostar [Insulin Glargine] Rash    Current Outpatient Medications on File Prior to Visit  Medication Sig Dispense Refill  . amLODipine (NORVASC) 10 MG tablet Take 1 tablet (10 mg total) by mouth daily. 90 tablet 3  . aspirin 81 MG tablet Take 81 mg by mouth daily.    Marland Kitchen glipiZIDE (GLUCOTROL XL) 5 MG 24 hr tablet Take 1 tablet (5 mg total) by mouth daily with breakfast. 90 tablet 0  . ibuprofen (ADVIL,MOTRIN) 200 MG tablet Take 200 mg by mouth every 6 (six) hours as needed. Reported on 03/22/2015    . Insulin Pen Needle (RELION PEN NEEDLES) 31G X 6 MM MISC USE 1  THREE TIMES DAILY 200 each 11  . LEVEMIR FLEXTOUCH 100 UNIT/ML FlexPen INJECT 30 UNITS SUBCUTANEOUSLY ONCE DAILY (Patient taking differently: 35 Units daily.) 15 mL 0   No current facility-administered medications on file prior to visit.    BP (!) 150/90 (BP Location: Left Arm, Patient Position: Sitting, Cuff Size: Normal)   Pulse 73   Temp 98.2 F (36.8 C) (Oral)   Resp 18   Ht '5\' 1"'  (1.549 m)   Wt 135 lb (61.2 kg)   SpO2 98%   BMI 25.51 kg/m       Objective:   Physical Exam Vitals and nursing note reviewed.  Constitutional:      Appearance: Normal appearance.  Cardiovascular:     Rate and Rhythm: Normal rate and regular rhythm.     Pulses: Normal pulses.      Heart sounds: Normal heart sounds.  Pulmonary:     Effort: Pulmonary effort is normal.     Breath sounds: Normal breath sounds.  Musculoskeletal:        General: Normal range of motion.  Skin:    General: Skin is warm and dry.     Capillary Refill: Capillary refill takes less than 2 seconds.  Neurological:     General: No focal deficit present.     Mental Status: She is alert and oriented to person, place, and time.  Psychiatric:        Mood and Affect: Mood normal.        Behavior: Behavior normal.        Thought Content: Thought content normal.        Judgment: Judgment normal.       Assessment & Plan:  1. Uncontrolled type 2 diabetes mellitus with hyperglycemia, with long-term current use of insulin (HCC) -Recheck A1c today.  Likely need to increase her insulin dose.  We will have her follow-up in 90 days. - Hemoglobin A1c; Future - Basic Metabolic Panel; Future - Basic Metabolic Panel - Hemoglobin A1c - glucose blood (FREESTYLE LITE) test strip; Use as instructed  Dispense: 300 strip; Refill: 3 - Lancets (FREESTYLE) lancets; Use as instructed  Dispense: 300 each; Refill: 3 - glucose monitoring kit (FREESTYLE) monitoring kit; 1 each by Does not apply route as needed for other.  Dispense: 1 each; Refill: 0  2. Essential hypertension -We will increase Benicar to 20 mg daily. - Hemoglobin A1c; Future - Basic Metabolic Panel; Future - Basic Metabolic Panel - Hemoglobin A1c - olmesartan (BENICAR) 20 MG tablet; Take 1 tablet (20 mg total) by mouth daily.  Dispense: 90 tablet; Refill: 0   Dorothyann Peng, NP

## 2020-04-24 NOTE — Patient Instructions (Signed)
It was great seeing you today   I will follow up with you once I get your blood work back   I am going to increase your Benicar to 20 mg daily.

## 2020-04-25 ENCOUNTER — Other Ambulatory Visit: Payer: Self-pay | Admitting: Adult Health

## 2020-04-25 ENCOUNTER — Telehealth: Payer: Self-pay | Admitting: Adult Health

## 2020-04-25 MED ORDER — EMPAGLIFLOZIN 10 MG PO TABS: 10.0000 mg | ORAL_TABLET | Freq: Every day | ORAL | 0 refills | Status: DC

## 2020-04-25 MED ORDER — LEVEMIR FLEXTOUCH 100 UNIT/ML ~~LOC~~ SOPN: 35.0000 [IU] | PEN_INJECTOR | Freq: Every day | SUBCUTANEOUS | 0 refills | Status: DC

## 2020-04-25 MED ORDER — RELION PEN NEEDLES 31G X 6 MM MISC: 3 refills | Status: DC

## 2020-04-25 NOTE — Telephone Encounter (Signed)
Up dated patient on labs, A1c has decreased from 13+ to 10.   Will add Jardiance.   Follow up in three months

## 2020-04-25 NOTE — Telephone Encounter (Signed)
Pt is returning your call and stated you can call her any time today she will be in /

## 2020-04-29 ENCOUNTER — Other Ambulatory Visit: Payer: Self-pay | Admitting: Adult Health

## 2020-04-29 DIAGNOSIS — Z794 Long term (current) use of insulin: Secondary | ICD-10-CM

## 2020-04-29 DIAGNOSIS — E1165 Type 2 diabetes mellitus with hyperglycemia: Secondary | ICD-10-CM

## 2020-05-08 ENCOUNTER — Telehealth: Payer: Self-pay | Admitting: Adult Health

## 2020-05-08 NOTE — Telephone Encounter (Signed)
Patient is calling and requesting a refill for olmesartan (BENICAR) 20 MG tablet to be sent to  [facility] [address] ([address]), [place]  [address] [place]) [place] [address]  Phone:  [phone] Fax:  [phone]  Also patient is requesting a FreeStyle Freedom Lite Meter and test stripes to be sent to the pharmacy as well, please advise. CB is [phone]

## 2020-05-08 NOTE — Telephone Encounter (Signed)
Called patient to let her know this medications were sent on 04-24-20, she reports she did not received them.  Called the pharmacy and they will get this ready today, patient was advised to pick up.

## 2020-05-29 DIAGNOSIS — R6889 Other general symptoms and signs: Secondary | ICD-10-CM | POA: Diagnosis not present

## 2020-07-17 ENCOUNTER — Ambulatory Visit: Payer: Medicare HMO

## 2020-07-24 ENCOUNTER — Other Ambulatory Visit: Payer: Self-pay

## 2020-07-25 ENCOUNTER — Ambulatory Visit: Payer: Medicare HMO | Admitting: Adult Health

## 2020-07-25 NOTE — Progress Notes (Deleted)
Subjective:    Patient ID: Katrina Garrison, female    DOB: 09/01/1940, 80 y.o.   MRN: [id number]  HPI 80 year old female who  has a past medical history of Anemia, Diabetes mellitus type II (2005), Diverticulosis, GERD (gastroesophageal reflux disease), History of lithotripsy, Hypertension, and Kidney stones.  She presents to the office today for three month follow up regarding DM and HTN   DM -discharged from endocrinology for not following up as directed over the last 7 years.  We are currently managing her diabetes now.  Currently prescribed Levemir 35 units daily and glipizide 5 mg ER daily.  In the past she has been intolerant to the following diabetic medications  Toujeo-rash plus itching Metformin - Blood in stools and diarrhea  Januvia - chest pain.   Her last A1c had improved from 13.7-10.0.   Lab Results  Component Value Date   HGBA1C 10.0 (H) 04/24/2020   HTN -currently prescribed Benicar 20 mg daily and Norvasc 10 mg daily.  During her last visit her blood pressure in the office was elevated at 150/90.  At home she was getting readings that were varying from 1 30-1 90 over 80s to 100s.  We decided to increase Benicar from 10 mg to 20 mg.  Today she reports that   Review of Systems See HPI   Past Medical History:  Diagnosis Date   Anemia    Diabetes mellitus type II 2005   Diverticulosis    GERD (gastroesophageal reflux disease)    History of lithotripsy    x3 for kidney stones   Hypertension    Kidney stones     Social History   Socioeconomic History   Marital status: Divorced    Spouse name: Not on file   Number of children: Not on file   Years of education: Not on file   Highest education level: Not on file  Occupational History   Occupation: Retired  Tobacco Use   Smoking status: Former    Years: 1.50    Pack years: 0.00    Types: Cigarettes    Quit date: 01/28/1985    Years since quitting: 35.5   Smokeless tobacco: Never  Substance and  Sexual Activity   Alcohol use: No   Drug use: No   Sexual activity: Not on file  Other Topics Concern   Not on file  Social History Narrative   Not on file   Social Determinants of Health   Financial Resource Strain: Low Risk    Difficulty of Paying Living Expenses: Not hard at all  Food Insecurity: No Food Insecurity   Worried About Charity fundraiser in the Last Year: Never true   [place] in the Last Year: Never true  Transportation Needs: No Transportation Needs   Lack of Transportation (Medical): No   Lack of Transportation (Non-Medical): No  Physical Activity: Insufficiently Active   Days of Exercise per Week: 3 days   Minutes of Exercise per Session: 30 min  Stress: No Stress Concern Present   Feeling of Stress : Not at all  Social Connections: Moderately Integrated   Frequency of Communication with Friends and Family: More than three times a week   Frequency of Social Gatherings with Friends and Family: More than three times a week   Attends Religious Services: More than 4 times per year   Active Member of [employer] or Organizations: Yes   Attends Archivist Meetings: More than 4 times per  year   Marital Status: Widowed  Human resources officer Violence: Not At Risk   Fear of Current or Ex-Partner: No   Emotionally Abused: No   Physically Abused: No   Sexually Abused: No    Past Surgical History:  Procedure Laterality Date   ABDOMINAL HYSTERECTOMY     childbirth x 2     EYE SURGERY     cataract    Family History  Problem Relation Age of Onset   Thyroid cancer Mother    Diabetes Mother    Hypertension Mother    Alcohol abuse Father    Hypertension Father    Multiple sclerosis Daughter    Multiple sclerosis Daughter    Multiple sclerosis Sister     Allergies  Allergen Reactions   Hydrochlorothiazide    Lisinopril Cough   Losartan Cough   Metformin And Related Diarrhea   Metoprolol Swelling    Leg cramping    Novolin R [Insulin] Itching    Sulfamethoxazole     REACTION: "comatose"   Toujeo Solostar [Insulin Glargine] Rash    Current Outpatient Medications on File Prior to Visit  Medication Sig Dispense Refill   amLODipine (NORVASC) 10 MG tablet Take 1 tablet (10 mg total) by mouth daily. 90 tablet 3   aspirin 81 MG tablet Take 81 mg by mouth daily.     empagliflozin (JARDIANCE) 10 MG TABS tablet Take 1 tablet (10 mg total) by mouth daily before breakfast. 90 tablet 0   glipiZIDE (GLUCOTROL XL) 5 MG 24 hr tablet Take 1 tablet by mouth once daily with breakfast 90 tablet 1   glucose blood (FREESTYLE LITE) test strip Use as instructed 300 strip 3   glucose monitoring kit (FREESTYLE) monitoring kit 1 each by Does not apply route as needed for other. 1 each 0   ibuprofen (ADVIL,MOTRIN) 200 MG tablet Take 200 mg by mouth every 6 (six) hours as needed. Reported on 03/22/2015     insulin detemir (LEVEMIR FLEXTOUCH) 100 UNIT/ML FlexPen Inject 35 Units into the skin daily. 31.5 mL 0   Insulin Pen Needle (RELION PEN NEEDLES) 31G X 6 MM MISC Use daily 90 each 3   Lancets (FREESTYLE) lancets Use as instructed 300 each 3   olmesartan (BENICAR) 20 MG tablet Take 1 tablet (20 mg total) by mouth daily. 90 tablet 0   No current facility-administered medications on file prior to visit.    There were no vitals taken for this visit.      Objective:   Physical Exam        Assessment & Plan:

## 2020-07-30 ENCOUNTER — Other Ambulatory Visit: Payer: Self-pay | Admitting: Adult Health

## 2020-08-08 ENCOUNTER — Other Ambulatory Visit: Payer: Self-pay

## 2020-08-08 ENCOUNTER — Ambulatory Visit: Payer: Medicare HMO | Admitting: Adult Health

## 2020-08-09 ENCOUNTER — Encounter: Payer: Self-pay | Admitting: Adult Health

## 2020-08-09 ENCOUNTER — Ambulatory Visit (INDEPENDENT_AMBULATORY_CARE_PROVIDER_SITE_OTHER): Payer: Medicare HMO | Admitting: Adult Health

## 2020-08-09 VITALS — BP 162/78 | HR 88 | Temp 99.2°F | Ht 61.0 in | Wt 135.0 lb

## 2020-08-09 DIAGNOSIS — Z794 Long term (current) use of insulin: Secondary | ICD-10-CM

## 2020-08-09 DIAGNOSIS — I10 Essential (primary) hypertension: Secondary | ICD-10-CM

## 2020-08-09 DIAGNOSIS — E1165 Type 2 diabetes mellitus with hyperglycemia: Secondary | ICD-10-CM | POA: Diagnosis not present

## 2020-08-09 DIAGNOSIS — R6889 Other general symptoms and signs: Secondary | ICD-10-CM | POA: Diagnosis not present

## 2020-08-09 LAB — POCT GLYCOSYLATED HEMOGLOBIN (HGB A1C): Hemoglobin A1C: 8.9 % — AB (ref 4.0–5.6)

## 2020-08-09 MED ORDER — FREESTYLE LITE TEST VI STRP: ORAL_STRIP | 3 refills | Status: DC

## 2020-08-09 MED ORDER — FREESTYLE LIBRE 14 DAY SENSOR MISC: 0 refills | Status: DC

## 2020-08-09 MED ORDER — OLMESARTAN MEDOXOMIL 40 MG PO TABS: 40.0000 mg | ORAL_TABLET | Freq: Every day | ORAL | 3 refills | Status: DC

## 2020-08-09 MED ORDER — LEVEMIR FLEXTOUCH 100 UNIT/ML ~~LOC~~ SOPN
35.0000 [IU] | PEN_INJECTOR | Freq: Every day | SUBCUTANEOUS | 1 refills | Status: DC
Start: 2020-08-09 — End: 2021-01-29

## 2020-08-09 NOTE — Progress Notes (Signed)
Subjective:    Patient ID: Katrina Garrison, female    DOB: August 24, 1940, 80 y.o.   MRN: [id number]  HPI 80 year old female who  has a past medical history of Anemia, Diabetes mellitus type II (2005), Diverticulosis, GERD (gastroesophageal reflux disease), History of lithotripsy, Hypertension, and Kidney stones.  She presents to the office today for three month follow up regarding DM and HTN  Her daughter is present with her today   DM -she is to be managed by endocrinology but was discharged about 6 months ago for not following up as directed.  She is currently prescribed Levemir 35 units daily, glipizide 5 mg ER daily, and Jardiance 10 mg.  In the past she has been intolerant to the following diabetic medication -Toujeo -rash plus itching - Metformin - blood stools and diarrhea -Januvia-chest pain  When she first started this journey with me her A1c was 13.7.  She has been working on significant lifestyle modifications and most recently ( over the last two weeks)  has started exercising 1 time a week, she is going to increase this to twice a week starting next week.  She is monitoring her blood sugars at home with readings between 120s and 160s, per her log her blood sugars have been trending down into the 120s 130s with her exercise routine.  Overall she is feeling better.  HTN -prescribed Norvasc 10 mg daily and Benicar 20 mg daily.  She is also been monitoring her blood pressure at home with readings in the 150s to 180s over 70s to 90s.  She denies dizziness, lightheadedness, chest pain, or shortness of breath  Review of Systems See HPI   Past Medical History:  Diagnosis Date   Anemia    Diabetes mellitus type II 2005   Diverticulosis    GERD (gastroesophageal reflux disease)    History of lithotripsy    x3 for kidney stones   Hypertension    Kidney stones     Social History   Socioeconomic History   Marital status: Divorced    Spouse name: Not on file   Number of  children: Not on file   Years of education: Not on file   Highest education level: Not on file  Occupational History   Occupation: Retired  Tobacco Use   Smoking status: Former    Years: 1.50    Types: Cigarettes    Quit date: 01/28/1985    Years since quitting: 35.5   Smokeless tobacco: Never  Substance and Sexual Activity   Alcohol use: No   Drug use: No   Sexual activity: Not on file  Other Topics Concern   Not on file  Social History Narrative   Not on file   Social Determinants of Health   Financial Resource Strain: Low Risk    Difficulty of Paying Living Expenses: Not hard at all  Food Insecurity: No Food Insecurity   Worried About Charity fundraiser in the Last Year: Never true   Ran Out of Food in the Last Year: Never true  Transportation Needs: No Transportation Needs   Lack of Transportation (Medical): No   Lack of Transportation (Non-Medical): No  Physical Activity: Insufficiently Active   Days of Exercise per Week: 3 days   Minutes of Exercise per Session: 30 min  Stress: No Stress Concern Present   Feeling of Stress : Not at all  Social Connections: Moderately Integrated   Frequency of Communication with Friends and Family: More than three times  a week   Frequency of Social Gatherings with Friends and Family: More than three times a week   Attends Religious Services: More than 4 times per year   Active Member of Clubs or Organizations: Yes   Attends Archivist Meetings: More than 4 times per year   Marital Status: Widowed  Human resources officer Violence: Not At Risk   Fear of Current or Ex-Partner: No   Emotionally Abused: No   Physically Abused: No   Sexually Abused: No    Past Surgical History:  Procedure Laterality Date   ABDOMINAL HYSTERECTOMY     childbirth x 2     EYE SURGERY     cataract    Family History  Problem Relation Age of Onset   Thyroid cancer Mother    Diabetes Mother    Hypertension Mother    Alcohol abuse Father     Hypertension Father    Multiple sclerosis Daughter    Multiple sclerosis Daughter    Multiple sclerosis Sister     Allergies  Allergen Reactions   Hydrochlorothiazide    Lisinopril Cough   Losartan Cough   Metformin And Related Diarrhea   Metoprolol Swelling    Leg cramping    Novolin R [Insulin] Itching   Sulfamethoxazole     REACTION: "comatose"   Toujeo Solostar [Insulin Glargine] Rash    Current Outpatient Medications on File Prior to Visit  Medication Sig Dispense Refill   amLODipine (NORVASC) 10 MG tablet Take 1 tablet (10 mg total) by mouth daily. 90 tablet 3   aspirin 81 MG tablet Take 81 mg by mouth daily.     glipiZIDE (GLUCOTROL XL) 5 MG 24 hr tablet Take 1 tablet by mouth once daily with breakfast 90 tablet 1   glucose monitoring kit (FREESTYLE) monitoring kit 1 each by Does not apply route as needed for other. 1 each 0   ibuprofen (ADVIL,MOTRIN) 200 MG tablet Take 200 mg by mouth every 6 (six) hours as needed. Reported on 03/22/2015     Insulin Pen Needle (RELION PEN NEEDLES) 31G X 6 MM MISC Use daily 90 each 3   JARDIANCE 10 MG TABS tablet TAKE 1 TABLET BY MOUTH ONCE DAILY BEFORE BREAKFAST 90 tablet 0   Lancets (FREESTYLE) lancets Use as instructed 300 each 3   PFIZER-BIONT COVID-19 VAC-TRIS SUSP injection      No current facility-administered medications on file prior to visit.    BP (!) 162/78   Pulse 88   Temp 99.2 F (37.3 C) (Oral)   Ht _0  (1.549 m)   Wt 135 lb (61.2 kg)   SpO2 97%   BMI 25.51 kg/m       Objective:   Physical Exam Vitals and nursing note reviewed.  Constitutional:      Appearance: Normal appearance.  Cardiovascular:     Rate and Rhythm: Normal rate and regular rhythm.     Pulses: Normal pulses.     Heart sounds: Normal heart sounds.  Pulmonary:     Effort: Pulmonary effort is normal.     Breath sounds: Normal breath sounds.  Musculoskeletal:        General: Normal range of motion.  Skin:    General: Skin is warm and  dry.  Neurological:     General: No focal deficit present.     Mental Status: She is alert and oriented to person, place, and time.  Psychiatric:        Mood and Affect: Mood  normal.        Behavior: Behavior normal.        Thought Content: Thought content normal.        Judgment: Judgment normal.      Assessment & Plan:  1. Uncontrolled type 2 diabetes mellitus with hyperglycemia, with long-term current use of insulin (HCC)  - POC HgB A1c - 8.9 -has improved.  She is making significant lifestyle modifications.  We will keep her medications the same for the next 3 months, if at that time there is not much more improvement we will increase her Jardiance to 25 mg. - insulin detemir (LEVEMIR FLEXTOUCH) 100 UNIT/ML FlexPen; Inject 35 Units into the skin at bedtime.  Dispense: 31.5 mL; Refill: 1 - Continuous Blood Gluc Sensor (FREESTYLE LIBRE 14 DAY SENSOR) MISC; Use with smart phone  Dispense: 6 each; Refill: 0 - glucose blood (FREESTYLE LITE) test strip; Use as instructed  Dispense: 300 strip; Refill: 3  2. Essential hypertension -Blood pressure not at goal.  We will increase Benicar to 40 mg daily.  Continue to monitor blood pressure at home.  - olmesartan (BENICAR) 40 MG tablet; Take 1 tablet (40 mg total) by mouth daily.  Dispense: 90 tablet; Refill: 3 - Consider adding agent   Dorothyann Peng, NP

## 2020-08-09 NOTE — Patient Instructions (Signed)
Your A1c dropped to 8.9 !  Continue to exercise. You are doing great   I have increased your blood pressure medication Benicar from 20 mg to 40 mg daily   I will see you back in 3 months

## 2020-10-02 DIAGNOSIS — E113393 Type 2 diabetes mellitus with moderate nonproliferative diabetic retinopathy without macular edema, bilateral: Secondary | ICD-10-CM | POA: Diagnosis not present

## 2020-10-02 DIAGNOSIS — Z961 Presence of intraocular lens: Secondary | ICD-10-CM | POA: Diagnosis not present

## 2020-10-02 DIAGNOSIS — H524 Presbyopia: Secondary | ICD-10-CM | POA: Diagnosis not present

## 2020-10-02 DIAGNOSIS — H04123 Dry eye syndrome of bilateral lacrimal glands: Secondary | ICD-10-CM | POA: Diagnosis not present

## 2020-10-02 LAB — HM DIABETES EYE EXAM

## 2020-10-08 ENCOUNTER — Ambulatory Visit (INDEPENDENT_AMBULATORY_CARE_PROVIDER_SITE_OTHER): Payer: Medicare HMO

## 2020-10-08 ENCOUNTER — Other Ambulatory Visit: Payer: Self-pay | Admitting: Adult Health

## 2020-10-08 ENCOUNTER — Other Ambulatory Visit: Payer: Self-pay

## 2020-10-08 DIAGNOSIS — Z1231 Encounter for screening mammogram for malignant neoplasm of breast: Secondary | ICD-10-CM

## 2020-10-08 DIAGNOSIS — Z0000 Encounter for general adult medical examination without abnormal findings: Secondary | ICD-10-CM

## 2020-10-08 NOTE — Patient Instructions (Addendum)
Ms. Degraff , Thank you for taking time to come for your Medicare Wellness Visit. I appreciate your ongoing commitment to your health goals. Please review the following plan we discussed and let me know if I can assist you in the future.   Screening recommendations/referrals: Colonoscopy: No longer required  Mammogram: Done 03/04/17 repeat every year Bone Density: Done 01/15/15 repeat every 2 years  Recommended yearly ophthalmology/optometry visit for glaucoma screening and checkup Recommended yearly dental visit for hygiene and checkup  Vaccinations: Influenza vaccine: Due  Pneumococcal vaccine: Completed  Tdap vaccine: Done 06/10/17 repeat every 10 years due 06/11/27 Shingles vaccine: Completed 06/10/17 & 03/25/18   Covid-19:Completed 1/22 & 03/10/19  Advanced directives: Please bring a copy of your health care power of attorney and living will to the office at your convenience.  Conditions/risks identified: Get A1C down to 6  Next appointment: Follow up in one year for your annual wellness visit    Preventive Care 65 Years and Older, Female Preventive care refers to lifestyle choices and visits with your health care provider that can promote health and wellness. What does preventive care include? A yearly physical exam. This is also called an annual well check. Dental exams once or twice a year. Routine eye exams. Ask your health care provider how often you should have your eyes checked. Personal lifestyle choices, including: Daily care of your teeth and gums. Regular physical activity. Eating a healthy diet. Avoiding tobacco and drug use. Limiting alcohol use. Practicing safe sex. Taking low-dose aspirin every day. Taking vitamin and mineral supplements as recommended by your health care provider. What happens during an annual well check? The services and screenings done by your health care provider during your annual well check will depend on your age, overall health, lifestyle  risk factors, and family history of disease. Counseling  Your health care provider may ask you questions about your: Alcohol use. Tobacco use. Drug use. Emotional well-being. Home and relationship well-being. Sexual activity. Eating habits. History of falls. Memory and ability to understand (cognition). Work and work Astronomer. Reproductive health. Screening  You may have the following tests or measurements: Height, weight, and BMI. Blood pressure. Lipid and cholesterol levels. These may be checked every 5 years, or more frequently if you are over 10 years old. Skin check. Lung cancer screening. You may have this screening every year starting at age 68 if you have a 30-pack-year history of smoking and currently smoke or have quit within the past 15 years. Fecal occult blood test (FOBT) of the stool. You may have this test every year starting at age 24. Flexible sigmoidoscopy or colonoscopy. You may have a sigmoidoscopy every 5 years or a colonoscopy every 10 years starting at age 19. Hepatitis C blood test. Hepatitis B blood test. Sexually transmitted disease (STD) testing. Diabetes screening. This is done by checking your blood sugar (glucose) after you have not eaten for a while (fasting). You may have this done every 1-3 years. Bone density scan. This is done to screen for osteoporosis. You may have this done starting at age 62. Mammogram. This may be done every 1-2 years. Talk to your health care provider about how often you should have regular mammograms. Talk with your health care provider about your test results, treatment options, and if necessary, the need for more tests. Vaccines  Your health care provider may recommend certain vaccines, such as: Influenza vaccine. This is recommended every year. Tetanus, diphtheria, and acellular pertussis (Tdap, Td) vaccine. You may  need a Td booster every 10 years. Zoster vaccine. You may need this after age 74. Pneumococcal  13-valent conjugate (PCV13) vaccine. One dose is recommended after age 58. Pneumococcal polysaccharide (PPSV23) vaccine. One dose is recommended after age 31. Talk to your health care provider about which screenings and vaccines you need and how often you need them. This information is not intended to replace advice given to you by your health care provider. Make sure you discuss any questions you have with your health care provider. Document Released: 02/08/2015 Document Revised: 10/02/2015 Document Reviewed: 11/13/2014 Elsevier Interactive Patient Education  2017 [place] Prevention in the Home Falls can cause injuries. They can happen to people of all ages. There are many things you can do to make your home safe and to help prevent falls. What can I do on the outside of my home? Regularly fix the edges of walkways and driveways and fix any cracks. Remove anything that might make you trip as you walk through a door, such as a raised step or threshold. Trim any bushes or trees on the path to your home. Use bright outdoor lighting. Clear any walking paths of anything that might make someone trip, such as rocks or tools. Regularly check to see if handrails are loose or broken. Make sure that both sides of any steps have handrails. Any raised decks and porches should have guardrails on the edges. Have any leaves, snow, or ice cleared regularly. Use sand or salt on walking paths during winter. Clean up any spills in your garage right away. This includes oil or grease spills. What can I do in the bathroom? Use night lights. Install grab bars by the toilet and in the tub and shower. Do not use towel bars as grab bars. Use non-skid mats or decals in the tub or shower. If you need to sit down in the shower, use a plastic, non-slip stool. Keep the floor dry. Clean up any water that spills on the floor as soon as it happens. Remove soap buildup in the tub or shower regularly. Attach  bath mats securely with double-sided non-slip rug tape. Do not have throw rugs and other things on the floor that can make you trip. What can I do in the bedroom? Use night lights. Make sure that you have a light by your bed that is easy to reach. Do not use any sheets or blankets that are too big for your bed. They should not hang down onto the floor. Have a firm chair that has side arms. You can use this for support while you get dressed. Do not have throw rugs and other things on the floor that can make you trip. What can I do in the kitchen? Clean up any spills right away. Avoid walking on wet floors. Keep items that you use a lot in easy-to-reach places. If you need to reach something above you, use a strong step stool that has a grab bar. Keep electrical cords out of the way. Do not use floor polish or wax that makes floors slippery. If you must use wax, use non-skid floor wax. Do not have throw rugs and other things on the floor that can make you trip. What can I do with my stairs? Do not leave any items on the stairs. Make sure that there are handrails on both sides of the stairs and use them. Fix handrails that are broken or loose. Make sure that handrails are as long as the stairways.  Check any carpeting to make sure that it is firmly attached to the stairs. Fix any carpet that is loose or worn. Avoid having throw rugs at the top or bottom of the stairs. If you do have throw rugs, attach them to the floor with carpet tape. Make sure that you have a light switch at the top of the stairs and the bottom of the stairs. If you do not have them, ask someone to add them for you. What else can I do to help prevent falls? Wear shoes that: Do not have high heels. Have rubber bottoms. Are comfortable and fit you well. Are closed at the toe. Do not wear sandals. If you use a stepladder: Make sure that it is fully opened. Do not climb a closed stepladder. Make sure that both sides of the  stepladder are locked into place. Ask someone to hold it for you, if possible. Clearly mark and make sure that you can see: Any grab bars or handrails. First and last steps. Where the edge of each step is. Use tools that help you move around (mobility aids) if they are needed. These include: Canes. Walkers. Scooters. Crutches. Turn on the lights when you go into a dark area. Replace any light bulbs as soon as they burn out. Set up your furniture so you have a clear path. Avoid moving your furniture around. If any of your floors are uneven, fix them. If there are any pets around you, be aware of where they are. Review your medicines with your doctor. Some medicines can make you feel dizzy. This can increase your chance of falling. Ask your doctor what other things that you can do to help prevent falls. This information is not intended to replace advice given to you by your health care provider. Make sure you discuss any questions you have with your health care provider. Document Released: 11/08/2008 Document Revised: 06/20/2015 Document Reviewed: 02/16/2014 Elsevier Interactive Patient Education  2017 [employer].

## 2020-10-08 NOTE — Progress Notes (Signed)
Virtual Visit via Telephone Note  I connected with  Katrina Garrison on 10/08/20 at 11:45 AM EDT by telephone and verified that I am speaking with the correct person using two identifiers.  Medicare Annual Wellness visit completed telephonically due to Covid-19 pandemic.   Persons participating in this call: This Health Coach and this patient.   Location: Patient: Home Provider: Office   I discussed the limitations, risks, security and privacy concerns of performing an evaluation and management service by telephone and the availability of in person appointments. The patient expressed understanding and agreed to proceed.  Unable to perform video visit due to video visit attempted and failed and/or patient does not have video capability.   Some vital signs may be absent or patient reported.   Willette Brace, LPN   Subjective:   Katrina Garrison is a 80 y.o. female who presents for Medicare Annual (Subsequent) preventive examination.  Review of Systems     Cardiac Risk Factors include: advanced age (>34mn, >>86women);diabetes mellitus;hypertension;dyslipidemia;female gender     Objective:    There were no vitals filed for this visit. There is no height or weight on file to calculate BMI.  Advanced Directives 10/08/2020 11/07/2019 03/26/2015 01/18/2013  Does Patient Have a Medical Advance Directive? Yes Yes Yes Patient does not have advance directive;Patient would not like information  Type of AScientist, forensicPower of AChunkyLiving will HHurleyLiving will - -  Does patient want to make changes to medical advance directive? - No - Patient declined - -  Copy of HDeseretin Chart? No - copy requested No - copy requested - -  Pre-existing out of facility DNR order (yellow form or pink MOST form) - - - No    Current Medications (verified) Outpatient Encounter Medications as of 10/08/2020  Medication Sig   amLODipine  (NORVASC) 10 MG tablet Take 1 tablet (10 mg total) by mouth daily.   aspirin 81 MG tablet Take 81 mg by mouth daily.   Continuous Blood Gluc Sensor (FREESTYLE LIBRE 14 DAY SENSOR) MISC Use with smart phone   glipiZIDE (GLUCOTROL XL) 5 MG 24 hr tablet Take 1 tablet by mouth once daily with breakfast   glucose blood (FREESTYLE LITE) test strip Use as instructed   glucose monitoring kit (FREESTYLE) monitoring kit 1 each by Does not apply route as needed for other.   ibuprofen (ADVIL,MOTRIN) 200 MG tablet Take 200 mg by mouth every 6 (six) hours as needed. Reported on 03/22/2015   insulin detemir (LEVEMIR FLEXTOUCH) 100 UNIT/ML FlexPen Inject 35 Units into the skin at bedtime.   Insulin Pen Needle (RELION PEN NEEDLES) 31G X 6 MM MISC Use daily   JARDIANCE 10 MG TABS tablet TAKE 1 TABLET BY MOUTH ONCE DAILY BEFORE BREAKFAST   Lancets (FREESTYLE) lancets Use as instructed   Multiple Vitamins-Minerals (MULTIPLE VITAMINS/WOMENS PO) Take by mouth.   olmesartan (BENICAR) 40 MG tablet Take 1 tablet (40 mg total) by mouth daily.   PFIZER-BIONT COVID-19 VAC-TRIS SUSP injection    No facility-administered encounter medications on file as of 10/08/2020.    Allergies (verified) Hydrochlorothiazide, Lisinopril, Losartan, Metformin and related, Metoprolol, Novolin r [insulin], Sulfamethoxazole, and Toujeo solostar [insulin glargine]   History: Past Medical History:  Diagnosis Date   Anemia    Diabetes mellitus type II 2005   Diverticulosis    GERD (gastroesophageal reflux disease)    History of lithotripsy    x3 for kidney stones   Hypertension  Kidney stones    Past Surgical History:  Procedure Laterality Date   ABDOMINAL HYSTERECTOMY     childbirth x 2     EYE SURGERY     cataract   Family History  Problem Relation Age of Onset   Thyroid cancer Mother    Diabetes Mother    Hypertension Mother    Alcohol abuse Father    Hypertension Father    Multiple sclerosis Daughter    Multiple  sclerosis Daughter    Multiple sclerosis Sister    Social History   Socioeconomic History   Marital status: Divorced    Spouse name: Not on file   Number of children: Not on file   Years of education: Not on file   Highest education level: Not on file  Occupational History   Occupation: Retired  Tobacco Use   Smoking status: Former    Years: 1.50    Types: Cigarettes    Quit date: 01/28/1985    Years since quitting: 35.7   Smokeless tobacco: Never  Substance and Sexual Activity   Alcohol use: No   Drug use: No   Sexual activity: Not on file  Other Topics Concern   Not on file  Social History Narrative   Not on file   Social Determinants of Health   Financial Resource Strain: Low Risk    Difficulty of Paying Living Expenses: Not hard at all  Food Insecurity: No Food Insecurity   Worried About Charity fundraiser in the Last Year: Never true   [place] in the Last Year: Never true  Transportation Needs: No Transportation Needs   Lack of Transportation (Medical): No   Lack of Transportation (Non-Medical): No  Physical Activity: Sufficiently Active   Days of Exercise per Week: 3 days   Minutes of Exercise per Session: 60 min  Stress: No Stress Concern Present   Feeling of Stress : Not at all  Social Connections: Moderately Integrated   Frequency of Communication with Friends and Family: More than three times a week   Frequency of Social Gatherings with Friends and Family: More than three times a week   Attends Religious Services: 1 to 4 times per year   Active Member of [employer] or Organizations: Yes   Attends Archivist Meetings: 1 to 4 times per year   Marital Status: Divorced    Tobacco Counseling Counseling given: Not Answered   Clinical Intake:  Pre-visit preparation completed: Yes  Pain : No/denies pain     BMI - recorded: 25.52 Nutritional Status: BMI 25 -29 Overweight Nutritional Risks: None Diabetes: Yes CBG done?: No Did pt.  bring in CBG monitor from home?: No  How often do you need to have someone help you when you read instructions, pamphlets, or other written materials from your doctor or pharmacy?: 1 - Never  Diabetic?Nutrition Risk Assessment:  Has the patient had any N/V/D within the last 2 months?  No  Does the patient have any non-healing wounds?  No  Has the patient had any unintentional weight loss or weight gain?  No   Diabetes:  Is the patient diabetic?  Yes  If diabetic, was a CBG obtained today?  No  Did the patient bring in their glucometer from home?  No  How often do you monitor your CBG's? .   Financial Strains and Diabetes Management:  Are you having any financial strains with the device, your supplies or your medication? No .  Does the  patient want to be seen by Chronic Care Management for management of their diabetes?  No  Would the patient like to be referred to a Nutritionist or for Diabetic Management?  No   Diabetic Exams:  Diabetic Eye Exam: Completed 12/27/19 Diabetic Foot Exam: Completed pt stated completed in 2022 will have report sent    Interpreter Needed?: No  Information entered by :: Charlott Rakes, LPN   Activities of Daily Living In your present state of health, do you have any difficulty performing the following activities: 10/08/2020 11/07/2019  Hearing? N N  Vision? N N  Difficulty concentrating or making decisions? N -  Walking or climbing stairs? N N  Dressing or bathing? N N  Doing errands, shopping? N N  Preparing Food and eating ? N N  Using the Toilet? N N  In the past six months, have you accidently leaked urine? N N  Do you have problems with loss of bowel control? N N  Managing your Medications? N N  Managing your Finances? N N  Housekeeping or managing your Housekeeping? N N  Some recent data might be hidden    Patient Care Team: Dorothyann Peng, NP as PCP - General (Family Medicine)  Indicate any recent Medical Services you may have  received from other than [facility] providers in the past year (date may be approximate).     Assessment:   This is a routine wellness examination for Katrina Garrison.  Hearing/Vision screen Hearing Screening - Comments:: Pt denies any hearing  Vision Screening - Comments:: Pt follows up with [facility]   Dietary issues and exercise activities discussed: Current Exercise Habits: Structured exercise class;Home exercise routine, Type of exercise: walking;Other - see comments (gym), Time (Minutes): 60, Frequency (Times/Week): 3, Weekly Exercise (Minutes/Week): 180   Goals Addressed             This Visit's Progress    Patient Stated       To reduce A1c to a 6        Depression Screen PHQ 2/9 Scores 10/08/2020 01/25/2020 11/07/2019 03/03/2017 12/04/2015 03/26/2015 11/21/2014  PHQ - 2 Score 0 0 0 0 0 0 0  PHQ- 9 Score - - 0 - - - -    Fall Risk Fall Risk  10/08/2020 01/25/2020 11/07/2019 03/03/2017 12/04/2015  Falls in the past year? 0 0 0 No No  Number falls in past yr: 0 0 0 - -  Injury with Fall? 0 0 0 - -  Risk for fall due to : Impaired vision - No Fall Risks - -  Follow up Falls prevention discussed - Falls evaluation completed;Falls prevention discussed - -    FALL RISK PREVENTION PERTAINING TO THE HOME:  Any stairs in or around the home? Yes  If so, are there any without handrails? No  Home free of loose throw rugs in walkways, pet beds, electrical cords, etc? Yes  Adequate lighting in your home to reduce risk of falls? Yes   ASSISTIVE DEVICES UTILIZED TO PREVENT FALLS:  Life alert? no Use of a cane, walker or w/c? No  Grab bars in the bathroom? Yes  Shower chair or bench in shower? No  Elevated toilet seat or a handicapped toilet? No   TIMED UP AND GO:  Was the test performed? No .   Cognitive Function:     6CIT Screen 10/08/2020 11/07/2019  What Year? 0 points 0 points  What month? 0 points 0 points  What time? 0 points 0  points  Count back from 20 0 points 0  points  Months in reverse 0 points 0 points  Repeat phrase 0 points 2 points  Total Score 0 2    Immunizations Immunization History  Administered Date(s) Administered   Fluad Quad(high Dose 65+) 11/03/2019   Influenza Split 12/16/2010, 11/11/2011, 11/08/2012   Influenza Whole 01/26/2005, 12/06/2009   Influenza, High Dose Seasonal PF 10/03/2014, 12/04/2015, 11/19/2016, 10/25/2017   Influenza,inj,Quad PF,6+ Mos 11/23/2013   PFIZER(Purple Top)SARS-COV-2 Vaccination 02/17/2019, 03/10/2019   Pneumococcal Conjugate-13 10/06/2013, 06/10/2017, 06/11/2017   Pneumococcal Polysaccharide-23 01/27/2003, 12/04/2015   Td 01/27/2003, 10/06/2013   Tdap 06/10/2017   Zoster Recombinat (Shingrix) 06/10/2017, 03/25/2018    TDAP status: Up to date  Flu Vaccine status: Due, Education has been provided regarding the importance of this vaccine. Advised may receive this vaccine at local pharmacy or Health Dept. Aware to provide a copy of the vaccination record if obtained from local pharmacy or Health Dept. Verbalized acceptance and understanding.  Pneumococcal vaccine status: Up to date  Covid-19 vaccine status: Completed vaccines  Qualifies for Shingles Vaccine? Yes   Zostavax completed No   Shingrix Completed?: Yes  Screening Tests Health Maintenance  Topic Date Due   Hepatitis C Screening  Never done   FOOT EXAM  12/03/2016   COVID-19 Vaccine (3 - Booster for Pfizer series) 08/07/2019   INFLUENZA VACCINE  08/26/2020   OPHTHALMOLOGY EXAM  12/26/2020   URINE MICROALBUMIN  01/24/2021   HEMOGLOBIN A1C  02/09/2021   TETANUS/TDAP  06/11/2027   DEXA SCAN  Completed   PNA vac Low Risk Adult  Completed   Zoster Vaccines- Shingrix  Completed   HPV VACCINES  Aged Out    Health Maintenance  Health Maintenance Due  Topic Date Due   Hepatitis C Screening  Never done   FOOT EXAM  12/03/2016   COVID-19 Vaccine (3 - Booster for Pfizer series) 08/07/2019   INFLUENZA VACCINE  08/26/2020     Colorectal cancer screening: No longer required.   Mammogram status: Completed 03/04/17. Repeat every year  Bone Density status: Completed 01/15/15. Results reflect: Bone density results: OSTEOPENIA. Repeat every 2 years.   Additional Screening:  Hepatitis C Screening: does qualify;  Vision Screening: Recommended annual ophthalmology exams for early detection of glaucoma and other disorders of the eye. Is the patient up to date with their annual eye exam?  Yes  Who is the provider or what is the name of the office in which the patient attends annual eye exams? [facility] If pt is not established with a provider, would they like to be referred to a provider to establish care? No .   Dental Screening: Recommended annual dental exams for proper oral hygiene  Community Resource Referral / Chronic Care Management: CRR required this visit?  No   CCM required this visit?  No      Plan:     I have personally reviewed and noted the following in the patient's chart:   Medical and social history Use of alcohol, tobacco or illicit drugs  Current medications and supplements including opioid prescriptions.  Functional ability and status Nutritional status Physical activity Advanced directives List of other physicians Hospitalizations, surgeries, and ER visits in previous 12 months Vitals Screenings to include cognitive, depression, and falls Referrals and appointments  In addition, I have reviewed and discussed with patient certain preventive protocols, quality metrics, and best practice recommendations. A written personalized care plan for preventive services as well as general preventive health  recommendations were provided to patient.     Willette Brace, LPN   [id number]   Nurse Notes: Pt is requesting that a medication be called in for yeast infection she believes she got during use of a medication, stated that she has tried over the counter with no relief Please advise

## 2020-10-11 ENCOUNTER — Other Ambulatory Visit: Payer: Self-pay

## 2020-10-11 ENCOUNTER — Ambulatory Visit
Admission: RE | Admit: 2020-10-11 | Discharge: 2020-10-11 | Disposition: A | Discharge status: Home / Self Care | Payer: Medicare HMO | Source: Ambulatory Visit | Attending: Adult Health | Admitting: Adult Health

## 2020-10-11 DIAGNOSIS — Z1231 Encounter for screening mammogram for malignant neoplasm of breast: Secondary | ICD-10-CM | POA: Diagnosis not present

## 2020-10-14 ENCOUNTER — Encounter: Payer: Self-pay | Admitting: Adult Health

## 2020-11-05 ENCOUNTER — Other Ambulatory Visit: Payer: Self-pay | Admitting: Adult Health

## 2020-11-07 ENCOUNTER — Other Ambulatory Visit: Payer: Self-pay

## 2020-11-08 ENCOUNTER — Telehealth: Payer: Self-pay | Admitting: Adult Health

## 2020-11-08 ENCOUNTER — Ambulatory Visit (INDEPENDENT_AMBULATORY_CARE_PROVIDER_SITE_OTHER): Payer: Medicare HMO | Admitting: Adult Health

## 2020-11-08 ENCOUNTER — Encounter: Payer: Self-pay | Admitting: Adult Health

## 2020-11-08 VITALS — BP 160/90 | HR 83 | Temp 99.1°F | Ht 61.0 in | Wt 132.0 lb

## 2020-11-08 DIAGNOSIS — R6889 Other general symptoms and signs: Secondary | ICD-10-CM | POA: Diagnosis not present

## 2020-11-08 DIAGNOSIS — I10 Essential (primary) hypertension: Secondary | ICD-10-CM | POA: Diagnosis not present

## 2020-11-08 DIAGNOSIS — Z23 Encounter for immunization: Secondary | ICD-10-CM | POA: Diagnosis not present

## 2020-11-08 DIAGNOSIS — E1165 Type 2 diabetes mellitus with hyperglycemia: Secondary | ICD-10-CM | POA: Diagnosis not present

## 2020-11-08 DIAGNOSIS — Z794 Long term (current) use of insulin: Secondary | ICD-10-CM | POA: Diagnosis not present

## 2020-11-08 LAB — POCT GLYCOSYLATED HEMOGLOBIN (HGB A1C): Hemoglobin A1C: 8 % — AB (ref 4.0–5.6)

## 2020-11-08 MED ORDER — GLIPIZIDE ER 5 MG PO TB24: 5.0000 mg | ORAL_TABLET | Freq: Every day | ORAL | 0 refills | Status: DC

## 2020-11-08 MED ORDER — EMPAGLIFLOZIN 25 MG PO TABS: 25.0000 mg | ORAL_TABLET | Freq: Every day | ORAL | 0 refills | Status: DC

## 2020-11-08 NOTE — Telephone Encounter (Signed)
Per note from OV today: 3. Essential hypertension -She will call back and let me know what dose of Benicar she is taking.

## 2020-11-08 NOTE — Telephone Encounter (Signed)
Pt call and stated this is the medication  olmesartan (BENICAR) 40 MG tablet

## 2020-11-08 NOTE — Progress Notes (Signed)
Subjective:    Patient ID: Katrina Garrison, female    DOB: [birth year], 80 y.o.   MRN: [id number]  HPI  80 year old female who  has a past medical history of Anemia, Diabetes mellitus type II (2005), Diverticulosis, GERD (gastroesophageal reflux disease), History of lithotripsy, Hypertension, and Kidney stones.  She presents to the office today for three month follow up regarding DM and HTN   DM II -currently prescribed Levemir 35 units daily, glipizide 5 mg ER daily, and Jardiance 10 mg.  She does monitor her blood sugars at home with readings between 70 and 210 with most of her readings in the mid 100's. .  She has started exercising more frequently and is trying to work on her diet.  During her visit in July 2022, her A1c had dropped from 13.7-8.9.  Lab Results  Component Value Date   HGBA1C 8.0 (A) 11/08/2020   In the past she has been intolerant to the following diabetic medication -Toujeo-rash and itching -Metformin - blood in stool and diarrhea -Januvia-chest pain  HTN -currently prescribed Norvasc 10 mg daily and Benicar 40 mg daily.  During her last visit Benicar was increased from 20 to 40 mg as her blood pressure readings at home were in the 150s to 180s over 70s to 90s.  Per her blood pressure log her blood pressures are between 115-170/70-100's. She is unsure if she increased her Benicar from 20 mg to 40 mg.  BP Readings from Last 3 Encounters:  11/08/20 (!) 160/90  08/09/20 (!) 162/78  04/24/20 (!) 150/90   Review of Systems See HPI   Past Medical History:  Diagnosis Date   Anemia    Diabetes mellitus type II 2005   Diverticulosis    GERD (gastroesophageal reflux disease)    History of lithotripsy    x3 for kidney stones   Hypertension    Kidney stones     Social History   Socioeconomic History   Marital status: Divorced    Spouse name: Not on file   Number of children: Not on file   Years of education: Not on file   Highest education level: Not on  file  Occupational History   Occupation: Retired  Tobacco Use   Smoking status: Former    Years: 1.50    Types: Cigarettes    Quit date: 01/28/1985    Years since quitting: 35.8   Smokeless tobacco: Never  Substance and Sexual Activity   Alcohol use: No   Drug use: No   Sexual activity: Not on file  Other Topics Concern   Not on file  Social History Narrative   Not on file   Social Determinants of Health   Financial Resource Strain: Low Risk    Difficulty of Paying Living Expenses: Not hard at all  Food Insecurity: No Food Insecurity   Worried About Charity fundraiser in the Last Year: Never true   Ran Out of Food in the Last Year: Never true  Transportation Needs: No Transportation Needs   Lack of Transportation (Medical): No   Lack of Transportation (Non-Medical): No  Physical Activity: Sufficiently Active   Days of Exercise per Week: 3 days   Minutes of Exercise per Session: 60 min  Stress: No Stress Concern Present   Feeling of Stress : Not at all  Social Connections: Moderately Integrated   Frequency of Communication with Friends and Family: More than three times a week   Frequency of Social Gatherings with  Friends and Family: More than three times a week   Attends Religious Services: 1 to 4 times per year   Active Member of Clubs or Organizations: Yes   Attends Music therapist: 1 to 4 times per year   Marital Status: Divorced  Human resources officer Violence: Not At Risk   Fear of Current or Ex-Partner: No   Emotionally Abused: No   Physically Abused: No   Sexually Abused: No    Past Surgical History:  Procedure Laterality Date   ABDOMINAL HYSTERECTOMY     childbirth x 2     EYE SURGERY     cataract    Family History  Problem Relation Age of Onset   Thyroid cancer Mother    Diabetes Mother    Hypertension Mother    Alcohol abuse Father    Hypertension Father    Multiple sclerosis Daughter    Multiple sclerosis Daughter    Multiple  sclerosis Sister     Allergies  Allergen Reactions   Hydrochlorothiazide    Lisinopril Cough   Losartan Cough   Metformin And Related Diarrhea   Metoprolol Swelling    Leg cramping    Novolin R [Insulin] Itching   Sulfamethoxazole     REACTION: "comatose"   Toujeo Solostar [Insulin Glargine] Rash    Current Outpatient Medications on File Prior to Visit  Medication Sig Dispense Refill   amLODipine (NORVASC) 10 MG tablet Take 1 tablet (10 mg total) by mouth daily. 90 tablet 3   aspirin 81 MG tablet Take 81 mg by mouth daily.     Continuous Blood Gluc Sensor (FREESTYLE LIBRE 14 DAY SENSOR) MISC Use with smart phone 6 each 0   glucose blood (FREESTYLE LITE) test strip Use as instructed 300 strip 3   glucose monitoring kit (FREESTYLE) monitoring kit 1 each by Does not apply route as needed for other. 1 each 0   ibuprofen (ADVIL,MOTRIN) 200 MG tablet Take 200 mg by mouth every 6 (six) hours as needed. Reported on 03/22/2015     Insulin Pen Needle (RELION PEN NEEDLES) 31G X 6 MM MISC Use daily 90 each 3   Lancets (FREESTYLE) lancets Use as instructed 300 each 3   Multiple Vitamins-Minerals (MULTIPLE VITAMINS/WOMENS PO) Take by mouth.     insulin detemir (LEVEMIR FLEXTOUCH) 100 UNIT/ML FlexPen Inject 35 Units into the skin at bedtime. 31.5 mL 1   olmesartan (BENICAR) 40 MG tablet Take 1 tablet (40 mg total) by mouth daily. 90 tablet 3   No current facility-administered medications on file prior to visit.    BP (!) 160/90   Pulse 83   Temp 99.1 F (37.3 C) (Oral)   Ht '5\' 1"'  (1.549 m)   Wt 132 lb (59.9 kg)   SpO2 93%   BMI 24.94 kg/m       Objective:   Physical Exam Vitals and nursing note reviewed.  Constitutional:      Appearance: Normal appearance.  Cardiovascular:     Rate and Rhythm: Normal rate and regular rhythm.     Pulses: Normal pulses.     Heart sounds: Normal heart sounds.  Pulmonary:     Effort: Pulmonary effort is normal.     Breath sounds: Normal breath  sounds.  Musculoskeletal:        General: Normal range of motion.  Skin:    General: Skin is warm and dry.     Capillary Refill: Capillary refill takes less than 2 seconds.  Neurological:  General: No focal deficit present.     Mental Status: She is alert and oriented to person, place, and time.  Psychiatric:        Mood and Affect: Mood normal.        Behavior: Behavior normal.        Thought Content: Thought content normal.        Judgment: Judgment normal.      Assessment & Plan:  1. Uncontrolled type 2 diabetes mellitus with hyperglycemia, with long-term current use of insulin (HCC) - empagliflozin (JARDIANCE) 25 MG TABS tablet; Take 1 tablet (25 mg total) by mouth daily before breakfast.  Dispense: 90 tablet; Refill: 0 - POC HgB A1c- 8.0 -continues to improve but not at goal.  We will increase Jardiance from 10 mg to 25 mg daily. - Follow up in three months  - glipiZIDE (GLUCOTROL XL) 5 MG 24 hr tablet; Take 1 tablet (5 mg total) by mouth daily with breakfast.  Dispense: 90 tablet; Refill: 0  2. Need for immunization against influenza  - Flu Vaccine QUAD High Dose(Fluad)  3. Essential hypertension -She will call back and let me know what dose of Benicar she is taking.  I would also like her to pick up a new blood pressure cuff as hers is old and likely not calibrated with the wide swings of blood pressure readings that she is getting at home. -May need to add hydralazine.  Unable to take ACE or ARB due to chronic dry cough.  Dorothyann Peng, NP

## 2020-12-09 ENCOUNTER — Telehealth: Payer: Self-pay | Admitting: Adult Health

## 2020-12-09 NOTE — Telephone Encounter (Signed)
Pt is calling and every  since cory put her on jardiance she has had vaginal itching and burning and it has became unbearable. Pt daughter has MS and told her mom to ask for nitrofurantoin. Pt would like nitrofurantoin are whatever cory suggest send into . Pt is aware she may have to make an appt  [facility] [address] ([address]), [place] - [address] Phone:  [phone]  Fax:  [phone]

## 2020-12-10 DIAGNOSIS — R6889 Other general symptoms and signs: Secondary | ICD-10-CM | POA: Diagnosis not present

## 2020-12-10 NOTE — Telephone Encounter (Signed)
Pt has been scheduled.  °

## 2020-12-11 ENCOUNTER — Encounter: Payer: Self-pay | Admitting: Adult Health

## 2020-12-11 ENCOUNTER — Ambulatory Visit (INDEPENDENT_AMBULATORY_CARE_PROVIDER_SITE_OTHER): Payer: Medicare HMO | Admitting: Adult Health

## 2020-12-11 VITALS — BP 160/100 | HR 97 | Temp 99.1°F | Ht 61.0 in | Wt 132.0 lb

## 2020-12-11 DIAGNOSIS — I10 Essential (primary) hypertension: Secondary | ICD-10-CM | POA: Diagnosis not present

## 2020-12-11 DIAGNOSIS — B379 Candidiasis, unspecified: Secondary | ICD-10-CM

## 2020-12-11 DIAGNOSIS — Z794 Long term (current) use of insulin: Secondary | ICD-10-CM

## 2020-12-11 DIAGNOSIS — E1165 Type 2 diabetes mellitus with hyperglycemia: Secondary | ICD-10-CM

## 2020-12-11 LAB — POCT URINALYSIS DIPSTICK
Bilirubin, UA: NEGATIVE
Blood, UA: POSITIVE
Glucose, UA: POSITIVE — AB
Ketones, UA: NEGATIVE
Leukocytes, UA: NEGATIVE
Nitrite, UA: NEGATIVE
Protein, UA: POSITIVE — AB
Spec Grav, UA: 1.025 (ref 1.010–1.025)
Urobilinogen, UA: 0.2 E.U./dL
pH, UA: 5.5 (ref 5.0–8.0)

## 2020-12-11 MED ORDER — FLUCONAZOLE 150 MG PO TABS: ORAL_TABLET | ORAL | 0 refills | Status: DC

## 2020-12-11 NOTE — Progress Notes (Signed)
Subjective:    Patient ID: Katrina Garrison, female    DOB: Jun 23, 1940, 80 y.o.   MRN: [id number]  HPI 80 year old female who  has a past medical history of Anemia, Diabetes mellitus type II (2005), Diverticulosis, GERD (gastroesophageal reflux disease), History of lithotripsy, Hypertension, and Kidney stones.  She presents to the office today for an acute issue   She reports having a yeast infection for the last month, her symptoms including itching and burning in her vagina with cottage cheese like discharge   She stopped taking her Jardiance 8 days ago because she thought that it was causing her to have a yeast infection.   She reports her blood sugars have been in the 140's   She did not take her blood pressure medication prior to coming to the her appointment today has been checking at home with readings mostly in the 140's.   BP Readings from Last 3 Encounters:  12/11/20 (!) 160/100  11/08/20 (!) 160/90  08/09/20 (!) 162/78    Review of Systems See HPI   Past Medical History:  Diagnosis Date   Anemia    Diabetes mellitus type II 2005   Diverticulosis    GERD (gastroesophageal reflux disease)    History of lithotripsy    x3 for kidney stones   Hypertension    Kidney stones     Social History   Socioeconomic History   Marital status: Divorced    Spouse name: Not on file   Number of children: Not on file   Years of education: Not on file   Highest education level: Not on file  Occupational History   Occupation: Retired  Tobacco Use   Smoking status: Former    Years: 1.50    Types: Cigarettes    Quit date: 01/28/1985    Years since quitting: 35.8   Smokeless tobacco: Never  Substance and Sexual Activity   Alcohol use: No   Drug use: No   Sexual activity: Not on file  Other Topics Concern   Not on file  Social History Narrative   Not on file   Social Determinants of Health   Financial Resource Strain: Low Risk    Difficulty of Paying Living  Expenses: Not hard at all  Food Insecurity: No Food Insecurity   Worried About Charity fundraiser in the Last Year: Never true   Ran Out of Food in the Last Year: Never true  Transportation Needs: No Transportation Needs   Lack of Transportation (Medical): No   Lack of Transportation (Non-Medical): No  Physical Activity: Sufficiently Active   Days of Exercise per Week: 3 days   Minutes of Exercise per Session: 60 min  Stress: No Stress Concern Present   Feeling of Stress : Not at all  Social Connections: Moderately Integrated   Frequency of Communication with Friends and Family: More than three times a week   Frequency of Social Gatherings with Friends and Family: More than three times a week   Attends Religious Services: 1 to 4 times per year   Active Member of [employer] or Organizations: Yes   Attends Archivist Meetings: 1 to 4 times per year   Marital Status: Divorced  Human resources officer Violence: Not At Risk   Fear of Current or Ex-Partner: No   Emotionally Abused: No   Physically Abused: No   Sexually Abused: No    Past Surgical History:  Procedure Laterality Date   ABDOMINAL HYSTERECTOMY  childbirth x 2     EYE SURGERY     cataract    Family History  Problem Relation Age of Onset   Thyroid cancer Mother    Diabetes Mother    Hypertension Mother    Alcohol abuse Father    Hypertension Father    Multiple sclerosis Daughter    Multiple sclerosis Daughter    Multiple sclerosis Sister     Allergies  Allergen Reactions   Hydrochlorothiazide    Lisinopril Cough   Losartan Cough   Metformin And Related Diarrhea   Metoprolol Swelling    Leg cramping    Novolin R [Insulin] Itching   Sulfamethoxazole     REACTION: "comatose"   Toujeo Solostar [Insulin Glargine] Rash    Current Outpatient Medications on File Prior to Visit  Medication Sig Dispense Refill   amLODipine (NORVASC) 10 MG tablet Take 1 tablet (10 mg total) by mouth daily. 90 tablet 3    aspirin 81 MG tablet Take 81 mg by mouth daily.     Continuous Blood Gluc Sensor (FREESTYLE LIBRE 14 DAY SENSOR) MISC Use with smart phone 6 each 0   empagliflozin (JARDIANCE) 25 MG TABS tablet Take 1 tablet (25 mg total) by mouth daily before breakfast. 90 tablet 0   glipiZIDE (GLUCOTROL XL) 5 MG 24 hr tablet Take 1 tablet (5 mg total) by mouth daily with breakfast. 90 tablet 0   glucose blood (FREESTYLE LITE) test strip Use as instructed 300 strip 3   glucose monitoring kit (FREESTYLE) monitoring kit 1 each by Does not apply route as needed for other. 1 each 0   ibuprofen (ADVIL,MOTRIN) 200 MG tablet Take 200 mg by mouth every 6 (six) hours as needed. Reported on 03/22/2015     Insulin Pen Needle (RELION PEN NEEDLES) 31G X 6 MM MISC Use daily 90 each 3   Lancets (FREESTYLE) lancets Use as instructed 300 each 3   Multiple Vitamins-Minerals (MULTIPLE VITAMINS/WOMENS PO) Take by mouth.     insulin detemir (LEVEMIR FLEXTOUCH) 100 UNIT/ML FlexPen Inject 35 Units into the skin at bedtime. 31.5 mL 1   olmesartan (BENICAR) 40 MG tablet Take 1 tablet (40 mg total) by mouth daily. 90 tablet 3   No current facility-administered medications on file prior to visit.    BP (!) 200/110   Pulse 97   Temp 99.1 F (37.3 C) (Oral)   Ht 5' 1" (1.549 m)   Wt 132 lb (59.9 kg)   SpO2 98%   BMI 24.94 kg/m       Objective:   Physical Exam Vitals and nursing note reviewed.  Constitutional:      Appearance: Normal appearance.  Cardiovascular:     Rate and Rhythm: Normal rate and regular rhythm.     Pulses: Normal pulses.     Heart sounds: Normal heart sounds.  Pulmonary:     Effort: Pulmonary effort is normal.     Breath sounds: Normal breath sounds.  Musculoskeletal:        General: Normal range of motion.  Skin:    General: Skin is warm and dry.  Neurological:     Mental Status: She is alert.       Assessment & Plan:  1. Yeast infection  - POC Urinalysis Dipstick- negative for infection -  fluconazole (DIFLUCAN) 150 MG tablet; Take one tablet today and 1 tablet in 3 days  Dispense: 2 tablet; Refill: 0  2. Uncontrolled type 2 diabetes mellitus with hyperglycemia, with long-term  current use of insulin ([place]) - Restart Jardiance - this may have contributed to her yeast infection but I would like her to restart it. If she has another yeast infection then we will change medications   3. Essential hypertension - Go home and take BP medications   Dorothyann Peng, NP

## 2020-12-23 ENCOUNTER — Other Ambulatory Visit: Payer: Self-pay | Admitting: Adult Health

## 2020-12-23 DIAGNOSIS — B379 Candidiasis, unspecified: Secondary | ICD-10-CM

## 2020-12-30 ENCOUNTER — Other Ambulatory Visit: Payer: Self-pay | Admitting: Adult Health

## 2020-12-30 ENCOUNTER — Telehealth: Payer: Self-pay

## 2020-12-30 DIAGNOSIS — E1165 Type 2 diabetes mellitus with hyperglycemia: Secondary | ICD-10-CM

## 2020-12-30 DIAGNOSIS — Z794 Long term (current) use of insulin: Secondary | ICD-10-CM

## 2020-12-30 DIAGNOSIS — B379 Candidiasis, unspecified: Secondary | ICD-10-CM

## 2020-12-30 NOTE — Telephone Encounter (Signed)
Patient called asking for Rx refill  fluconazole (DIFLUCAN) 150 MG tablet And also empagliflozin (JARDIANCE) 25 MG TABS tablet is not working for her

## 2021-01-01 MED ORDER — EMPAGLIFLOZIN 25 MG PO TABS: 25.0000 mg | ORAL_TABLET | Freq: Every day | ORAL | 0 refills | Status: DC

## 2021-01-01 MED ORDER — FLUCONAZOLE 150 MG PO TABS: ORAL_TABLET | ORAL | 0 refills | Status: DC

## 2021-01-01 NOTE — Telephone Encounter (Signed)
Please advise 

## 2021-01-01 NOTE — Addendum Note (Signed)
Addended by: Waymon Amato R on: 01/01/2021 05:22 PM   Modules accepted: Orders

## 2021-01-15 ENCOUNTER — Other Ambulatory Visit: Payer: Self-pay | Admitting: Adult Health

## 2021-01-15 DIAGNOSIS — B379 Candidiasis, unspecified: Secondary | ICD-10-CM

## 2021-01-28 ENCOUNTER — Ambulatory Visit (INDEPENDENT_AMBULATORY_CARE_PROVIDER_SITE_OTHER): Payer: Medicare HMO | Admitting: Adult Health

## 2021-01-28 ENCOUNTER — Encounter: Payer: Self-pay | Admitting: Adult Health

## 2021-01-28 VITALS — BP 200/100 | HR 106 | Temp 98.8°F | Ht 61.0 in | Wt 135.0 lb

## 2021-01-28 DIAGNOSIS — Z0001 Encounter for general adult medical examination with abnormal findings: Secondary | ICD-10-CM

## 2021-01-28 DIAGNOSIS — I10 Essential (primary) hypertension: Secondary | ICD-10-CM

## 2021-01-28 DIAGNOSIS — Z794 Long term (current) use of insulin: Secondary | ICD-10-CM | POA: Diagnosis not present

## 2021-01-28 DIAGNOSIS — E1165 Type 2 diabetes mellitus with hyperglycemia: Secondary | ICD-10-CM

## 2021-01-28 LAB — COMPREHENSIVE METABOLIC PANEL
ALT: 12 U/L (ref 0–35)
AST: 15 U/L (ref 0–37)
Albumin: 3.8 g/dL (ref 3.5–5.2)
Alkaline Phosphatase: 81 U/L (ref 39–117)
BUN: 19 mg/dL (ref 6–23)
CO2: 26 mEq/L (ref 19–32)
Calcium: 9.4 mg/dL (ref 8.4–10.5)
Chloride: 103 mEq/L (ref 96–112)
Creatinine, Ser: 0.97 mg/dL (ref 0.40–1.20)
GFR: 55.43 mL/min — ABNORMAL LOW (ref >60.00)
Glucose, Bld: 238 mg/dL — ABNORMAL HIGH (ref 70–99)
Potassium: 3.9 mEq/L (ref 3.5–5.1)
Sodium: 138 mEq/L (ref 135–145)
Total Bilirubin: 0.7 mg/dL (ref 0.2–1.2)
Total Protein: 7.5 g/dL (ref 6.0–8.3)

## 2021-01-28 LAB — CBC WITH DIFFERENTIAL/PLATELET
Basophils Absolute: 0 10*3/uL (ref 0.0–0.1)
Basophils Relative: 0.5 % (ref 0.0–3.0)
Eosinophils Absolute: 0.1 10*3/uL (ref 0.0–0.7)
Eosinophils Relative: 0.8 % (ref 0.0–5.0)
HCT: 39 % (ref 36.0–46.0)
Hemoglobin: 12.4 g/dL (ref 12.0–15.0)
Lymphocytes Relative: 22.6 % (ref 12.0–46.0)
Lymphs Abs: 1.5 10*3/uL (ref 0.7–4.0)
MCHC: 31.8 g/dL (ref 30.0–36.0)
MCV: 81.6 fl (ref 78.0–100.0)
Monocytes Absolute: 0.4 10*3/uL (ref 0.1–1.0)
Monocytes Relative: 5.6 % (ref 3.0–12.0)
Neutro Abs: 4.6 10*3/uL (ref 1.4–7.7)
Neutrophils Relative %: 70.5 % (ref 43.0–77.0)
Platelets: 226 10*3/uL (ref 150.0–400.0)
RBC: 4.78 Mil/uL (ref 3.87–5.11)
RDW: 17.6 % — ABNORMAL HIGH (ref 11.5–15.5)
WBC: 6.5 10*3/uL (ref 4.0–10.5)

## 2021-01-28 LAB — TSH: TSH: 2.02 u[IU]/mL (ref 0.35–5.50)

## 2021-01-28 LAB — LIPID PANEL
Cholesterol: 154 mg/dL (ref 0–200)
HDL: 44.9 mg/dL (ref >39.00)
LDL Cholesterol: 88 mg/dL (ref 0–99)
NonHDL: 109.13
Total CHOL/HDL Ratio: 3
Triglycerides: 106 mg/dL (ref 0.0–149.0)
VLDL: 21.2 mg/dL (ref 0.0–40.0)

## 2021-01-28 LAB — HEMOGLOBIN A1C: Hgb A1c MFr Bld: 10.5 % — ABNORMAL HIGH (ref 4.6–6.5)

## 2021-01-28 NOTE — Patient Instructions (Signed)
It was great seeing you today   We will recheck your labs today   Please do not stop any medications prior to talking to me first  I will follow up with you regarding your labs   I will see you back in three months

## 2021-01-28 NOTE — Progress Notes (Signed)
Subjective:    Patient ID: Katrina Garrison, female    DOB: 09/12/1940, 81 y.o.   MRN: [id number]  HPI  Patient presents for yearly preventative medicine examination. She is a pleasant 81 year old female who  has a past medical history of Anemia, Diabetes mellitus type II (2005), Diverticulosis, GERD (gastroesophageal reflux disease), History of lithotripsy, Hypertension, and Kidney stones.  Uncontrolled Type 2 DM-currently managed with Levemir 35 units daily, glipizide 5 mg ER daily, and Jardiance 10 mg daily.  She does monitor her blood sugars at home with readings between 120's and 200s. She reports that she stopped the Jardiance over the last two weeks due chronic yeast infections. Reports that she is eating healthy, enjoys fish and chicken with vegetables. Has not been able to exercise as of recently.   In the past she has been intolerant to the following diabetic medications -Toujeo-rash and itching -Metformin - blood in stool and diarrhea -Januvia-chest pain  Lab Results  Component Value Date   HGBA1C 8.0 (A) 11/08/2020   HTN -prescribed Norvasc 10 mg daily and Benicar 40 mg daily. She stopped taking Benicar as she read on google that it was discontinued. Per her BP cuff her BP prior to stopping Benicar her BP was 120-140/70's. Since stopping the medication her BP has been in the 200's/100's.   BP Readings from Last 3 Encounters:  01/28/21 (!) 200/100  12/11/20 (!) 160/100  11/08/20 (!) 160/90    All immunizations and health maintenance protocols were reviewed with the patient and needed orders were placed.  Appropriate screening laboratory values were ordered for the patient including screening of hyperlipidemia, renal function and hepatic function.  Medication reconciliation,  past medical history, social history, problem list and allergies were reviewed in detail with the patient  Goals were established with regard to weight loss, exercise, and  diet in compliance with  medications  She has no acute complaints today   Review of Systems  Constitutional: Negative.   HENT: Negative.    Eyes: Negative.   Respiratory: Negative.    Cardiovascular: Negative.   Gastrointestinal: Negative.   Endocrine: Negative.   Genitourinary: Negative.   Musculoskeletal: Negative.   Skin: Negative.   Allergic/Immunologic: Negative.   Neurological: Negative.   Hematological: Negative.   Psychiatric/Behavioral: Negative.    Past Medical History:  Diagnosis Date   Anemia    Diabetes mellitus type II 2005   Diverticulosis    GERD (gastroesophageal reflux disease)    History of lithotripsy    x3 for kidney stones   Hypertension    Kidney stones     Social History   Socioeconomic History   Marital status: Divorced    Spouse name: Not on file   Number of children: Not on file   Years of education: Not on file   Highest education level: Not on file  Occupational History   Occupation: Retired  Tobacco Use   Smoking status: Former    Years: 1.50    Types: Cigarettes    Quit date: 01/28/1985    Years since quitting: 36.0   Smokeless tobacco: Never  Substance and Sexual Activity   Alcohol use: No   Drug use: No   Sexual activity: Not on file  Other Topics Concern   Not on file  Social History Narrative   Not on file   Social Determinants of Health   Financial Resource Strain: Low Risk    Difficulty of Paying Living Expenses: Not hard at all  Food Insecurity: No Food Insecurity   Worried About Charity fundraiser in the Last Year: Never true   Ran Out of Food in the Last Year: Never true  Transportation Needs: No Transportation Needs   Lack of Transportation (Medical): No   Lack of Transportation (Non-Medical): No  Physical Activity: Sufficiently Active   Days of Exercise per Week: 3 days   Minutes of Exercise per Session: 60 min  Stress: No Stress Concern Present   Feeling of Stress : Not at all  Social Connections: Moderately Integrated    Frequency of Communication with Friends and Family: More than three times a week   Frequency of Social Gatherings with Friends and Family: More than three times a week   Attends Religious Services: 1 to 4 times per year   Active Member of [employer] or Organizations: Yes   Attends Music therapist: 1 to 4 times per year   Marital Status: Divorced  Human resources officer Violence: Not At Risk   Fear of Current or Ex-Partner: No   Emotionally Abused: No   Physically Abused: No   Sexually Abused: No    Past Surgical History:  Procedure Laterality Date   ABDOMINAL HYSTERECTOMY     childbirth x 2     EYE SURGERY     cataract    Family History  Problem Relation Age of Onset   Thyroid cancer Mother    Diabetes Mother    Hypertension Mother    Alcohol abuse Father    Hypertension Father    Multiple sclerosis Daughter    Multiple sclerosis Daughter    Multiple sclerosis Sister     Allergies  Allergen Reactions   Hydrochlorothiazide    Lisinopril Cough   Losartan Cough   Metformin And Related Diarrhea   Metoprolol Swelling    Leg cramping    Novolin R [Insulin] Itching   Sulfamethoxazole     REACTION: "comatose"   Toujeo Solostar [Insulin Glargine] Rash    Current Outpatient Medications on File Prior to Visit  Medication Sig Dispense Refill   amLODipine (NORVASC) 10 MG tablet Take 1 tablet (10 mg total) by mouth daily. 90 tablet 3   aspirin 81 MG tablet Take 81 mg by mouth daily.     Continuous Blood Gluc Sensor (FREESTYLE LIBRE 14 DAY SENSOR) MISC Use with smart phone 6 each 0   empagliflozin (JARDIANCE) 25 MG TABS tablet Take 1 tablet (25 mg total) by mouth daily before breakfast. 90 tablet 0   fluconazole (DIFLUCAN) 150 MG tablet TAKE 1 TABLET BY MOUTH NOW THEN 1  TABLET  IN  3  DAYS 2 tablet 0   glipiZIDE (GLUCOTROL XL) 5 MG 24 hr tablet Take 1 tablet (5 mg total) by mouth daily with breakfast. 90 tablet 0   glucose blood (FREESTYLE LITE) test strip Use as  instructed 300 strip 3   glucose monitoring kit (FREESTYLE) monitoring kit 1 each by Does not apply route as needed for other. 1 each 0   ibuprofen (ADVIL,MOTRIN) 200 MG tablet Take 200 mg by mouth every 6 (six) hours as needed. Reported on 03/22/2015     insulin detemir (LEVEMIR FLEXTOUCH) 100 UNIT/ML FlexPen Inject 35 Units into the skin at bedtime. 31.5 mL 1   Insulin Pen Needle (RELION PEN NEEDLES) 31G X 6 MM MISC Use daily 90 each 3   Lancets (FREESTYLE) lancets Use as instructed 300 each 3   Multiple Vitamins-Minerals (MULTIPLE VITAMINS/WOMENS PO) Take by mouth.  olmesartan (BENICAR) 40 MG tablet Take 1 tablet (40 mg total) by mouth daily. 90 tablet 3   No current facility-administered medications on file prior to visit.    There were no vitals taken for this visit.      Objective:   Physical Exam Vitals and nursing note reviewed.  Constitutional:      General: She is not in acute distress.    Appearance: Normal appearance. She is well-developed. She is not ill-appearing.  HENT:     Head: Normocephalic and atraumatic.     Right Ear: Tympanic membrane, ear canal and external ear normal. There is no impacted cerumen.     Left Ear: Tympanic membrane, ear canal and external ear normal. There is no impacted cerumen.     Nose: Nose normal. No congestion or rhinorrhea.     Mouth/Throat:     Mouth: Mucous membranes are moist.     Pharynx: Oropharynx is clear. No oropharyngeal exudate or posterior oropharyngeal erythema.  Eyes:     General:        Right eye: No discharge.        Left eye: No discharge.     Extraocular Movements: Extraocular movements intact.     Conjunctiva/sclera: Conjunctivae normal.     Pupils: Pupils are equal, round, and reactive to light.  Neck:     Thyroid: No thyromegaly.     Vascular: No carotid bruit.     Trachea: No tracheal deviation.  Cardiovascular:     Rate and Rhythm: Normal rate and regular rhythm.     Pulses: Normal pulses.     Heart  sounds: Normal heart sounds. No murmur heard.   No friction rub. No gallop.  Pulmonary:     Effort: Pulmonary effort is normal. No respiratory distress.     Breath sounds: Normal breath sounds. No stridor. No wheezing, rhonchi or rales.  Chest:     Chest wall: No tenderness.  Abdominal:     General: Abdomen is flat. Bowel sounds are normal. There is no distension.     Palpations: Abdomen is soft. There is no mass.     Tenderness: There is no abdominal tenderness. There is no right CVA tenderness, left CVA tenderness, guarding or rebound.     Hernia: No hernia is present.  Musculoskeletal:        General: No swelling, tenderness, deformity or signs of injury. Normal range of motion.     Cervical back: Normal range of motion and neck supple.     Right lower leg: No edema.     Left lower leg: No edema.  Lymphadenopathy:     Cervical: No cervical adenopathy.  Skin:    General: Skin is warm and dry.     Coloration: Skin is not jaundiced or pale.     Findings: No bruising, erythema, lesion or rash.  Neurological:     General: No focal deficit present.     Mental Status: She is alert and oriented to person, place, and time.     Cranial Nerves: No cranial nerve deficit.     Sensory: No sensory deficit.     Motor: No weakness.     Coordination: Coordination normal.     Gait: Gait normal.     Deep Tendon Reflexes: Reflexes normal.  Psychiatric:        Mood and Affect: Mood normal.        Behavior: Behavior normal.        Thought Content: Thought content  normal.        Judgment: Judgment normal.      Assessment & Plan:  1. Encounter for general adult medical examination with abnormal findings - Advised not to stop medications on her own  - Continue to eat healthy. Needs some form of aerobic exercise - CBC with Differential/Platelet; Future - Comprehensive metabolic panel; Future - Hemoglobin A1c; Future - Lipid panel; Future - TSH; Future - TSH - Lipid panel - Hemoglobin A1c -  Comprehensive metabolic panel - CBC with Differential/Platelet  2. Essential hypertension - Extremely high today. Advised to go home after this visit and take her prescribed medications. Restart Benicar 40 mg  - CBC with Differential/Platelet; Future - Comprehensive metabolic panel; Future - Hemoglobin A1c; Future - Lipid panel; Future - TSH; Future - TSH - Lipid panel - Hemoglobin A1c - Comprehensive metabolic panel - CBC with Differential/Platelet  3. Uncontrolled type 2 diabetes mellitus with hyperglycemia, with long-term current use of insulin ([facility]) - D/c Jardiance d/t chronic yeast infection - Consider increasing Glipizide or BID dosing of Levemir.  - CBC with Differential/Platelet; Future - Comprehensive metabolic panel; Future - Hemoglobin A1c; Future - Lipid panel; Future - TSH; Future - TSH - Lipid panel - Hemoglobin A1c - Comprehensive metabolic panel - CBC with Differential/Platelet  Dorothyann Peng, NP

## 2021-01-29 ENCOUNTER — Telehealth: Payer: Self-pay | Admitting: Adult Health

## 2021-01-29 DIAGNOSIS — Z794 Long term (current) use of insulin: Secondary | ICD-10-CM

## 2021-01-29 DIAGNOSIS — E1165 Type 2 diabetes mellitus with hyperglycemia: Secondary | ICD-10-CM

## 2021-01-29 MED ORDER — GLIPIZIDE ER 5 MG PO TB24: 5.0000 mg | ORAL_TABLET | Freq: Every day | ORAL | 0 refills | Status: DC

## 2021-01-29 MED ORDER — LEVEMIR FLEXTOUCH 100 UNIT/ML ~~LOC~~ SOPN: 20.0000 [IU] | PEN_INJECTOR | Freq: Two times a day (BID) | SUBCUTANEOUS | 0 refills | Status: DC

## 2021-01-29 NOTE — Telephone Encounter (Signed)
Updated patient on her labs.   Aic has increased from 8.0 to 10.5   She can no longer tolerate Jardiance.   Will increase Levemir to 20 mg BID from 35 mg daily

## 2021-01-30 ENCOUNTER — Telehealth: Payer: Self-pay | Admitting: Adult Health

## 2021-01-30 NOTE — Chronic Care Management (AMB) (Signed)
°  Chronic Care Management   Note  01/30/2021 Name: ORVILLA TRUETT MRN: [id number] DOB: 1940/05/12  [name] is a 81 y.o. year old female who is a primary care patient of [name], NP. I reached out to [name] by phone today in response to a referral sent by Ms. Franchon M Girtman's PCP, [name], NP.   Ms. Allensworth was given information about Chronic Care Management services today including:  CCM service includes personalized support from designated clinical staff supervised by her physician, including individualized plan of care and coordination with other care providers 24/7 contact phone numbers for assistance for urgent and routine care needs. Service will only be billed when office clinical staff spend 20 minutes or more in a month to coordinate care. Only one practitioner may furnish and bill the service in a calendar month. The patient may stop CCM services at any time (effective at the end of the month) by phone call to the office staff.   Patient agreed to services and verbal consent obtained.   Follow up plan:   [employer] manager, fast food

## 2021-02-14 ENCOUNTER — Other Ambulatory Visit: Payer: Self-pay | Admitting: Adult Health

## 2021-02-14 DIAGNOSIS — B379 Candidiasis, unspecified: Secondary | ICD-10-CM

## 2021-02-20 ENCOUNTER — Other Ambulatory Visit: Payer: Self-pay | Admitting: Adult Health

## 2021-02-20 DIAGNOSIS — B379 Candidiasis, unspecified: Secondary | ICD-10-CM

## 2021-02-24 ENCOUNTER — Other Ambulatory Visit: Payer: Self-pay | Admitting: Adult Health

## 2021-02-24 DIAGNOSIS — B379 Candidiasis, unspecified: Secondary | ICD-10-CM

## 2021-02-24 DIAGNOSIS — E1165 Type 2 diabetes mellitus with hyperglycemia: Secondary | ICD-10-CM

## 2021-02-24 DIAGNOSIS — Z794 Long term (current) use of insulin: Secondary | ICD-10-CM

## 2021-03-03 ENCOUNTER — Other Ambulatory Visit: Payer: Self-pay | Admitting: Adult Health

## 2021-03-03 DIAGNOSIS — B379 Candidiasis, unspecified: Secondary | ICD-10-CM

## 2021-03-04 ENCOUNTER — Telehealth: Payer: Self-pay | Admitting: Pharmacist

## 2021-03-04 NOTE — Chronic Care Management (AMB) (Signed)
Chronic Care Management Pharmacy Assistant   Name: Katrina Garrison  MRN: [id number] DOB: [birth year]  Katrina Garrison is an 81 y.o. year old female who presents for her initial CCM visit with the clinical pharmacist.  Reason for Encounter: Chart prep for initial visit with Jeni Salles the clinical pharmacist on 03/11/2021.   Conditions to be addressed/monitored: HTN, HLD, DMII, GERD, and Asthma  Recent office visits:  01/28/2021 Dorothyann Peng NP - Patient was seen for Encounter for general adult medical examination with abnormal findings and additional issues. Discontinued Jardiance and Diflucan. No follow up noted.  12/11/2020 Dorothyann Peng NP - Patient was seen for yeast infection and additional issues. Started Diflucan 150 mg. Restarted Jardiance. No follow up noted.  11/08/2020 Dorothyann Peng NP - Patient was seen for Uncontrolled type 2 diabetes mellitus with hyperglycemia, with long-term current use of insulin and additional issues. No medication changes. No follow up noted.  10/08/2020 Charlott Rakes LPN - Medicare annual wellness exam.  Recent consult visits:  None  Hospital visits:  None  Medications: Outpatient Encounter Medications as of 03/04/2021  Medication Sig   amLODipine (NORVASC) 10 MG tablet Take 1 tablet (10 mg total) by mouth daily.   aspirin 81 MG tablet Take 81 mg by mouth daily.   Continuous Blood Gluc Sensor (FREESTYLE LIBRE 14 DAY SENSOR) MISC Use with smart phone   glipiZIDE (GLUCOTROL XL) 5 MG 24 hr tablet Take 1 tablet (5 mg total) by mouth daily with breakfast.   glucose blood (FREESTYLE LITE) test strip Use as instructed   glucose monitoring kit (FREESTYLE) monitoring kit 1 each by Does not apply route as needed for other.   ibuprofen (ADVIL,MOTRIN) 200 MG tablet Take 200 mg by mouth every 6 (six) hours as needed. Reported on 03/22/2015   Insulin Pen Needle (RELION PEN NEEDLES) 31G X 6 MM MISC Use daily   Lancets (FREESTYLE) lancets Use  as instructed   LEVEMIR FLEXTOUCH 100 UNIT/ML FlexTouch Pen INJECT 35 UNITS SUBCUTANEOUSLY AT BEDTIME   Multiple Vitamins-Minerals (MULTIPLE VITAMINS/WOMENS PO) Take by mouth.   olmesartan (BENICAR) 40 MG tablet Take 1 tablet (40 mg total) by mouth daily.   No facility-administered encounter medications on file as of 03/04/2021.  Fill History: LEVEMIR FLEXTOUCH    INJ 01/03/2021 86   OLMESARTAN 40MG TAB 11/10/2020 90   AMLODIPINE 10MG TAB 08/05/2020 90   GLIPIZIDE ER 5MG    TAB 11/08/2020 90   Have you seen any other providers since your last visit? Patient denies any recent visits.   Any changes in your medications or health? Patient denies any changes.  Any side effects from any medications? Patient denies any side effects from medications  Do you have an symptoms or problems not managed by your medications? Patient denies any symptoms not managed by her medications.   Any concerns about your health right now? Patient does mention she has had a little dizziness, it had kept her from going to the gym.  Has your provider asked that you check blood pressure, blood sugar, or follow special diet at home? Patient is checking blood pressures and blood sugars. Her blood pressures are around 180/98 - 200/110, she is not resting for 10-15 minutes prior to checking. Patient does eat a healthy diet which includes meat, eggs, vegetables and fruits.   Do you get any type of exercise on a regular basis? Patient is walking with weights and staying active all the time.   Can you think of  a goal you would like to reach for your health? Stay healthy  Do you have any problems getting your medications? Levemir is on back order with the pharmacy, her pharmacist will call her this Friday if they can't get it in, if this happens she plans to call the office for a possible sample.   Is there anything that you would like to discuss during the appointment? Patient is wondering if Levemir is not working as well  as it used to as she has been on it for over 10 years.   Please bring medications and supplements to appointment  Note: Patient is very happy and positive, she was a joy to speak with.   Care Gaps: AWV - completed 10/08/2020 Last BP - 200/100 on 01/28/2021 Last A1C - 10.5 on 01/28/2021 Hep C screen - never done Covid booster - overdue Mal[facility] - overdue  Star Rating Drugs: Glipizide 5 mg - last fill date 11/08/2020 90 DS at [place] 100 un/ml - last filled 01/03/2021 84 DS at [facility] Olmesartan 40 mg - last filled 11/10/2020 90 DS at [facility] Verified last fill dates with [place] Pharmacist Assistant [phone]

## 2021-03-06 ENCOUNTER — Telehealth: Payer: Self-pay | Admitting: Pharmacist

## 2021-03-06 NOTE — Chronic Care Management (AMB) (Addendum)
° ° °  Chronic Care Management Pharmacy Assistant   Name: Katrina Garrison  MRN: [id number] DOB: 1940/06/30  03/11/2021 APPOINTMENT REMINDER  Katrina Garrison was reminded to have all medications, supplements and any blood glucose and blood pressure readings available for review with Gaylord Shih Pharm. D, at her telephone visit on 03/11/2021 at 11:00.   Questions: Have you had any recent office visit or specialist visit outside of [facility] systems? Patient denies any recent visits outside of [facility]  Are there any concerns you would like to discuss during your office visit? Patient was tested for fatty liver in the past and is wanting to know if this should be followed up.   Are you having any problems obtaining your medications? (Whether it pharmacy issues or cost)  Levemir is on back order with the pharmacy, her pharmacist will call her this Friday if they can't get it in, if this happens she plans to call the office for a possible sample. If they don't have samples what will be the next plan? Patient states she has plenty of medication to take her to her appointment.  If patient has any PAP medications ask if they are having any problems getting their PAP medication or refill? Patient denies any medications being filled by PAP  Care Gaps: AWV - completed 10/08/2020 Last BP - 200/100 on 01/28/2021 Last A1C - 10.5 on 01/28/2021 Hep C screen - never done Covid booster - overdue Malb - overdue  Star Rating Drug: Glipizide 5 mg - last fill date 11/08/2020 90 DS at [facility]  Levemir Flextouch 100 un/ml - last filled 01/03/2021 84 DS at [facility] Olmesartan 40 mg - last filled 11/10/2020 90 DS at [facility] Verified last fill dates with Richard  Any gaps in medications fill history? Yes  Inetta Fermo [facility]  Clinical Pharmacist Assistant [phone]

## 2021-03-11 ENCOUNTER — Ambulatory Visit (INDEPENDENT_AMBULATORY_CARE_PROVIDER_SITE_OTHER): Payer: Medicare HMO | Admitting: Pharmacist

## 2021-03-11 DIAGNOSIS — E1165 Type 2 diabetes mellitus with hyperglycemia: Secondary | ICD-10-CM

## 2021-03-11 DIAGNOSIS — I10 Essential (primary) hypertension: Secondary | ICD-10-CM

## 2021-03-11 NOTE — Progress Notes (Signed)
Chronic Care Management Pharmacy Note  03/12/2021 Name:  Katrina Garrison MRN:  [id number] DOB:  12-11-1940  Summary: BP not at goal < 140/90 A1c not at goal < 7% LDL not at goal < 70  Recommendations/Changes made from today's visit: -Recommend moderate intensity statin therapy based on DM diagnosis -Recommend starting spironolactone or consider HCTZ (pending provider response) for BP lowering -Recommended starting Ozempic for A1c lowering -Recommend stopping aspirin given primary prevention and age  Plan: Tolerance, DM and BP assessment in 2-3 weeks Follow up in 3 months  Subjective: Katrina Garrison is an 81 y.o. year old female who is a primary patient of Nafziger, Tommi Rumps, NP.  The CCM team was consulted for assistance with disease management and care coordination needs.    Engaged with patient by telephone for initial visit in response to provider referral for pharmacy case management and/or care coordination services.   Consent to Services:  The patient was given the following information about Chronic Care Management services today, agreed to services, and gave verbal consent: 1. CCM service includes personalized support from designated clinical staff supervised by the primary care provider, including individualized plan of care and coordination with other care providers 2. 24/7 contact phone numbers for assistance for urgent and routine care needs. 3. Service will only be billed when office clinical staff spend 20 minutes or more in a month to coordinate care. 4. Only one practitioner may furnish and bill the service in a calendar month. 5.The patient may stop CCM services at any time (effective at the end of the month) by phone call to the office staff. 6. The patient will be responsible for cost sharing (co-pay) of up to 20% of the service fee (after annual deductible is met). Patient agreed to services and consent obtained.  Patient Care Team: Dorothyann Peng, NP as PCP -  General (Family Medicine) Viona Gilmore, [facility] as Pharmacist (Pharmacist)  Recent office visits: 01/28/2021 Dorothyann Peng NP - Patient was seen for Encounter for general adult medical examination with abnormal findings and additional issues. Discontinued Jardiance and Diflucan. No follow up noted.   12/11/2020 Dorothyann Peng NP - Patient was seen for yeast infection and additional issues. Started Diflucan 150 mg. Restarted Jardiance. No follow up noted.   11/08/2020 Dorothyann Peng NP - Patient was seen for Uncontrolled type 2 diabetes mellitus with hyperglycemia, with long-term current use of insulin and additional issues. No medication changes. No follow up noted.   10/08/2020 Charlott Rakes LPN - Medicare annual wellness exam.  Recent consult visits: None  Hospital visits: None in previous 6 months   Objective:  Lab Results  Component Value Date   CREATININE 0.97 01/28/2021   BUN 19 01/28/2021   GFR 55.43 (L) 01/28/2021   GFRNONAA 51 (L) 11/03/2019   GFRAA 59 (L) 11/03/2019   NA 138 01/28/2021   K 3.9 01/28/2021   CALCIUM 9.4 01/28/2021   CO2 26 01/28/2021   GLUCOSE 238 (H) 01/28/2021    Lab Results  Component Value Date/Time   HGBA1C 10.5 (H) 01/28/2021 10:25 AM   HGBA1C 8.0 (A) 11/08/2020 01:37 PM   HGBA1C 8.9 (A) 08/09/2020 01:34 PM   HGBA1C 10.0 (H) 04/24/2020 10:17 AM   GFR 55.43 (L) 01/28/2021 10:25 AM   GFR 52.47 (L) 04/24/2020 10:17 AM   MICROALBUR 103.8 (H) 01/25/2020 02:40 PM   MICROALBUR 1.98 (H) 10/14/2012 08:47 AM    Last diabetic Eye exam:  Lab Results  Component Value Date/Time  HMDIABEYEEXA Retinopathy (A) 10/02/2020 12:00 AM    Last diabetic Foot exam: No results found for: HMDIABFOOTEX   Lab Results  Component Value Date   CHOL 154 01/28/2021   HDL 44.90 01/28/2021   LDLCALC 88 01/28/2021   TRIG 106.0 01/28/2021   CHOLHDL 3 01/28/2021    Hepatic Function Latest Ref Rng & Units 01/28/2021 01/25/2020 11/03/2019  Total Protein 6.0 - 8.3  g/dL 7.5 7.6 7.2  Albumin 3.5 - 5.2 g/dL 3.8 4.2 -  AST 0 - 37 U/L '15 15 13  ' ALT 0 - 35 U/L '12 12 11  ' Alk Phosphatase 39 - 117 U/L 81 89 -  Total Bilirubin 0.2 - 1.2 mg/dL 0.7 0.6 0.5  Bilirubin, Direct 0.0 - 0.3 mg/dL - - -    Lab Results  Component Value Date/Time   TSH 2.02 01/28/2021 10:25 AM   TSH 1.51 01/25/2020 02:40 PM    CBC Latest Ref Rng & Units 01/28/2021 01/25/2020 11/03/2019  WBC 4.0 - 10.5 K/uL 6.5 8.5 8.7  Hemoglobin 12.0 - 15.0 g/dL 12.4 12.7 12.8  Hematocrit 36.0 - 46.0 % 39.0 38.3 40.7  Platelets 150.0 - 400.0 K/uL 226.0 264.0 241    Lab Results  Component Value Date/Time   VD25OH 36 11/03/2019 11:07 AM   VD25OH 22.52 (L) 12/04/2015 10:41 AM   VD25OH 20.23 (L) 11/21/2014 11:04 AM    Clinical ASCVD: No  The 10-year ASCVD risk score (Arnett DK, et al., 2019) is: 43.3%   Values used to calculate the score:     Age: 81 years     Sex: Female     Is Non-Hispanic African American: Yes     Diabetic: Yes     Tobacco smoker: No     Systolic Blood Pressure: 989 mmHg     Is BP treated: Yes     HDL Cholesterol: 44.9 mg/dL     Total Cholesterol: 154 mg/dL    Depression screen [facility] 2/9 01/28/2021 10/08/2020 01/25/2020  Decreased Interest 0 0 0  Down, Depressed, Hopeless 0 0 0  PHQ - 2 Score 0 0 0  Altered sleeping - - -  Tired, decreased energy - - -  Change in appetite - - -  Feeling bad or failure about yourself  - - -  Trouble concentrating - - -  Moving slowly or fidgety/restless - - -  Suicidal thoughts - - -  PHQ-9 Score - - -  Difficult doing work/chores - - -     Social History   Tobacco Use  Smoking Status Former   Years: 1.50   Types: Cigarettes   Quit date: 01/28/1985   Years since quitting: 36.1  Smokeless Tobacco Never   BP Readings from Last 3 Encounters:  01/28/21 (!) 200/100  12/11/20 (!) 160/100  11/08/20 (!) 160/90   Pulse Readings from Last 3 Encounters:  01/28/21 (!) 106  12/11/20 97  11/08/20 83   Wt Readings from Last 3  Encounters:  01/28/21 135 lb (61.2 kg)  12/11/20 132 lb (59.9 kg)  11/08/20 132 lb (59.9 kg)   BMI Readings from Last 3 Encounters:  01/28/21 25.51 kg/m  12/11/20 24.94 kg/m  11/08/20 24.94 kg/m    Assessment/Interventions: Review of patient past medical history, allergies, medications, health status, including review of consultants reports, laboratory and other test data, was performed as part of comprehensive evaluation and provision of chronic care management services.   SDOH:  (Social Determinants of Health) assessments and interventions performed: Yes SDOH Interventions  Flowsheet Row Most Recent Value  SDOH Interventions   Financial Strain Interventions Intervention Not Indicated  Transportation Interventions Intervention Not Indicated      SDOH Screenings   Alcohol Screen: Not on file  Depression (PHQ2-9): Low Risk    PHQ-2 Score: 0  Financial Resource Strain: Low Risk    Difficulty of Paying Living Expenses: Not hard at all  Food Insecurity: No Food Insecurity   Worried About Charity fundraiser in the Last Year: Never true   Ran Out of Food in the Last Year: Never true  Housing: Low Risk    Last Housing Risk Score: 0  Physical Activity: Sufficiently Active   Days of Exercise per Week: 3 days   Minutes of Exercise per Session: 60 min  Social Connections: Moderately Integrated   Frequency of Communication with Friends and Family: More than three times a week   Frequency of Social Gatherings with Friends and Family: More than three times a week   Attends Religious Services: 1 to 4 times per year   Active Member of [employer] or Organizations: Yes   Attends Archivist Meetings: 1 to 4 times per year   Marital Status: Divorced  Stress: No Stress Concern Present   Feeling of Stress : Not at all  Tobacco Use: Medium Risk   Smoking Tobacco Use: Former   Smokeless Tobacco Use: Never   Passive Exposure: Not on Pensions consultant Needs: No Transportation  Needs   Lack of Transportation (Medical): No   Lack of Transportation (Non-Medical): No   Patient reports she feels like she is motivated and ready to take care of herself and is working on improving her lifestyle and diet. She feels her biggest concern is with her blood pressures and blood sugars right now as neither one is very controlled. She doesn't check either one as often as she should.  Patient reports that her Levemir is on back order with the pharmacy right now but believes she has enough to get her until the end of the month. She also was curious if the Levemir was working as well as it once was. She has been on it for 10 years but has tried other insulins and cannot recall previous reactions.   Patient reports her kids are "fabulous" and threw her a surprise birthday party over the weekend with 20-25 people that showed up. She had a great time and is still on cloud 9 from the party.  Patient is not an early riser but does feel like she sleeps pretty well. She has been walking around the complex to get some exercise. She used to walk and talk with her daughter on the phone but hasn't been doing that as much recently.  She reports she been taking all of her medications at one time in the morning and thinks this may be contributing to how she is feeling.  She has had issues with endocrinology in the past and was discharged although she cannot recall why. She said she attended appointments on time and showed up but was still discharged.  CCM Care Plan  Allergies  Allergen Reactions   Hydrochlorothiazide    Jardiance [Empagliflozin]     Yeast infections    Lisinopril Cough   Losartan Cough   Metformin And Related Diarrhea   Metoprolol Swelling    Leg cramping    Novolin R [Insulin] Itching   Sulfamethoxazole     REACTION: "comatose"   Toujeo Solostar [Insulin Glargine] Rash  Medications Reviewed Today     Reviewed by Viona Gilmore, [facility] (Pharmacist) on 03/11/21 at  46  Med List Status: <None>   Medication Order Taking? Sig Documenting Provider Last Dose Status Informant  amLODipine (NORVASC) 10 MG tablet [id number]  Take 1 tablet (10 mg total) by mouth daily. Nafziger, Tommi Rumps, NP  Active   aspirin 81 MG tablet [id number]  Take 81 mg by mouth daily. [provider]  Active   Continuous Blood Gluc Sensor (FREESTYLE LIBRE 14 DAY SENSOR) [place] [id number]  Use with smart phone Nafziger, Tommi Rumps, NP  Active   glipiZIDE (GLUCOTROL XL) 5 MG 24 hr tablet [id number] Yes Take 1 tablet (5 mg total) by mouth daily with breakfast. Dorothyann Peng, NP Taking Active   glucose blood (FREESTYLE LITE) test strip [id number]  Use as instructed Dorothyann Peng, NP  Active   glucose monitoring kit (FREESTYLE) monitoring kit [id number]  1 each by Does not apply route as needed for other. Nafziger, Tommi Rumps, NP  Active   ibuprofen (ADVIL,MOTRIN) 200 MG tablet [id number]  Take 200 mg by mouth every 6 (six) hours as needed. Reported on 03/22/2015 [provider]  Active Self  Insulin Pen Needle (RELION PEN NEEDLES) 31G X 6 MM MISC [id number]  Use daily Nafziger, Tommi Rumps, NP  Active   Lancets (FREESTYLE) lancets [id number]  Use as instructed Dorothyann Peng, NP  Active   LEVEMIR FLEXTOUCH 100 UNIT/ML FlexTouch Pen [id number] Yes INJECT 35 UNITS SUBCUTANEOUSLY AT BEDTIME Dorothyann Peng, NP Taking Active   Multiple Vitamins-Minerals (MULTIPLE VITAMINS/WOMENS PO) [id number]  Take by mouth. [provider]  Active   olmesartan (BENICAR) 40 MG tablet [id number] Yes Take 1 tablet (40 mg total) by mouth daily. Dorothyann Peng, NP Taking Active             Patient Active Problem List   Diagnosis Date Noted   Uncontrolled type 2 diabetes mellitus with hyperglycemia, with long-term current use of insulin ([place]) 03/08/2015   Hyperlipidemia 11/23/2013   Diverticulosis 02/21/2013   Ischemic colitis ([place]) 01/20/2013   GI bleed 01/18/2013   Extrinsic asthma, unspecified 03/17/2012   Essential  hypertension 08/09/2006   GERD 08/09/2006    Immunization History  Administered Date(s) Administered   Fluad Quad(high Dose 65+) 11/03/2019, 11/08/2020   Influenza Split 12/16/2010, 11/11/2011, 11/08/2012   Influenza Whole 01/26/2005, 12/06/2009   Influenza, High Dose Seasonal PF 10/03/2014, 12/04/2015, 11/19/2016, 10/25/2017   Influenza,inj,Quad PF,6+ Mos 11/23/2013   PFIZER(Purple Top)SARS-COV-2 Vaccination 02/17/2019, 03/10/2019   Pneumococcal Conjugate-13 10/06/2013, 06/10/2017, 06/11/2017   Pneumococcal Polysaccharide-23 01/27/2003, 12/04/2015   Td 01/27/2003, 10/06/2013   Tdap 06/10/2017   Zoster Recombinat (Shingrix) 06/10/2017, 03/25/2018    Conditions to be addressed/monitored:  Hypertension, Hyperlipidemia, Diabetes, and GERD  Care Plan : [place]  Updates made by Viona Gilmore, [place] since 03/12/2021 12:00 AM     Problem: Problem: Hypertension, Hyperlipidemia, Diabetes, and GERD      Long-Range Goal: Patient-Specific Goal   Start Date: 03/11/2021  Expected End Date: 03/11/2022  This Visit's Progress: On track  Priority: High  Note:   Current Barriers:  Unable to independently monitor therapeutic efficacy Unable to achieve control of diabetes and hypertension  Unable to self administer medications as prescribed  Pharmacist Clinical Goal(s):  Patient will verbalize ability to afford treatment regimen achieve adherence to monitoring guidelines and medication adherence to achieve therapeutic efficacy achieve control of diabetes and hypertension as evidenced by A1c and blood pressure readings  through collaboration with PharmD and  provider.   Interventions: 1:1 collaboration with Dorothyann Peng, NP regarding development and update of comprehensive plan of care as evidenced by provider attestation and co-signature Inter-disciplinary care team collaboration (see longitudinal plan of care) Comprehensive medication review performed; medication list  updated in electronic medical record  Hypertension (BP goal <130/80) -Uncontrolled -Current treatment: Amlodipine 10 mg 1 tablet daily - in AM - Appropriate, Query effective, Safe, Accessible Olmesartan 40 mg 1 tablet daily - in AM - Appropriate, Query effective, Safe, Accessible -Medications previously tried: HCTZ (unknown reaction)  -Current home readings: 205; usually high 100s - 200s; Omron arm cuff   -Current dietary habits: using [place] salt; recommended lower sodium salt; no more canned foods, more frozen foods now; [place] lunch meat  -Current exercise habits: not doing anything super consistently -Reports hypotensive/hypertensive symptoms -Educated on BP goals and benefits of medications for prevention of heart attack, stroke and kidney damage; Daily salt intake goal < 2300 mg; Exercise goal of 150 minutes per week; Importance of home blood pressure monitoring; Proper BP monitoring technique; -Counseled to monitor BP at home every other day, document, and provide log at future appointments -Counseled on diet and exercise extensively Recommended to continue current medication  Hyperlipidemia: (LDL goal < 70) -Uncontrolled -Current treatment: No medications -Medications previously tried: none  -Current dietary patterns: chicken and fish and not much red meat -Current exercise habits: nothing consistently -Educated on Cholesterol goals;  Benefits of statin for ASCVD risk reduction; Importance of limiting foods high in cholesterol; -Counseled on diet and exercise extensively Recommended moderate intensity statin.  Diabetes (A1c goal <7%) -Uncontrolled -Current medications: Levemir 100 units/ml inject 20 units twice daily - Appropriate, Query effective, Safe, Accessible Glipizide XL 5 mg  1 tablet daily with breakfast - Appropriate, Query effective, Safe, Accessible -Medications previously tried: Novolin, Januvia (chest pain), Jardiance (yeast infections), Toujeo,  Tresiba, metformin (diarrhea) -Current home glucose readings fasting glucose: 260 (not checking consistently) post prandial glucose: n/a -Denies hypoglycemic/hyperglycemic symptoms -Current meal patterns: 2 meals a day breakfast: spinach, onions and eggs, sausage  lunch: not often  dinner: fish, chicken, kale; prime rib once in a blue moon; pasta salad snacks: peanuts, almonds, popcorn, apples, oranges, lemons; not many sweets, plain yogurt drinks: green tea, water, coffee (once a week), hibiscus tea -Current exercise: was doing an exercise class at the gym; postponed because of the holidays and COVID -Educated on A1c and blood sugar goals; Exercise goal of 150 minutes per week; Benefits of routine self-monitoring of blood sugar; Continuous glucose monitoring; Carbohydrate counting and/or plate method -Counseled to check feet daily and get yearly eye exams -Recommended Ozempic for A1c lowering.  Osteopenia (Goal prevent fractures) -Not ideally controlled -Last DEXA Scan: 2016   T-Score femoral neck: -1.5  T-Score total hip: n/a  T-Score lumbar spine: -1.3  T-Score forearm radius: n/a  10-year probability of major osteoporotic fracture: 4.9%  10-year probability of hip fracture: 0.9% -Patient is not a candidate for pharmacologic treatment -Current treatment  Multivitamin 1 tablet daily -Medications previously tried: none  -Recommend [phone] units of vitamin D daily. Recommend 1200 mg of calcium daily from dietary and supplemental sources. -Recommended scheduling repeat bone density scan.  Health Maintenance -Vaccine gaps: none -Current therapy:  Ibuprofen 200 mg as needed Aspirin 81 mg 1 tablet as needed -Educated on Cost vs benefit of each product must be carefully weighed by individual consumer -Patient is satisfied with current therapy and denies issues -Counseled on risk vs benefits of aspirin therapy for primary prevention.  Patient Goals/Self-Care  Activities Patient will:  - take medications as prescribed as evidenced by patient report and record review check glucose daily, document, and provide at future appointments check blood pressure a few times a week, document, and provide at future appointments target a minimum of 150 minutes of moderate intensity exercise weekly  Follow Up Plan: The care management team will reach out to the patient again over the next 14 days.        Medication Assistance: None required.  Patient affirms current coverage meets needs.  Compliance/Adherence/Medication fill history: Care Gaps: Hep C screening, COVID booster, urine microalbumin Last BP - 200/100 on 01/28/2021 Last A1C - 10.5 on 01/28/2021  Star-Rating Drugs: Glipizide 5 mg - last fill date 01/30/2021 90 DS at [facility] 100 un/ml - last filled 01/03/2021 84 DS at [facility] Olmesartan 40 mg - last filled 11/10/2020 90 DS at [facility] Verified last fill dates with Edward Qualia  Patient's preferred pharmacy is:  [facility] [address] ([place] [place] [address] [place] ([place]) [place] [address] Phone: [phone] Fax: [phone]  Uses pill box? No - bottles lined up in kitchen; pill box Pt endorses 90% compliance  We discussed: Benefits of medication synchronization, packaging and delivery as well as enhanced pharmacist oversight with [facility]. Patient decided to: Utilize [facility] pharmacy for medication synchronization, packaging and delivery  Care Plan and Follow Up Patient Decision:  Patient agrees to Care Plan and Follow-up.  Plan: The care management team will reach out to the patient again over the next 30 days.  Jeni Salles, PharmD, [place] Pharmacist [place] [facility] [place]

## 2021-03-12 NOTE — Patient Instructions (Signed)
Hi Katrina Garrison,  It was great to get to meet you over the telephone! Below is a summary of some of the topics we discussed.   Don't forget to make sure to have some yogurt and/or cheese every day to make sure you are getting plenty of calcium in your diet.  Please keep on checking your blood pressure at least a few times each week and your blood sugars as well.   Please reach out to me if you have any questions or need anything before our follow up!  Best, Maddie  Gaylord Shih, PharmD, [facility] Clinical Pharmacist [facility] [place] [phone]   Visit Information   Goals Addressed             This Visit's Progress    Monitor and Manage My Blood Sugar-Diabetes Type 2       Timeframe:  Long-Range Goal Priority:  High Start Date:                             Expected End Date:                       Follow Up Date 06/08/21    - check blood sugar at prescribed times - check blood sugar if I feel it is too high or too low - enter blood sugar readings and medication or insulin into daily log - take the blood sugar log to all doctor visits    Why is this important?   Checking your blood sugar at home helps to keep it from getting very high or very low.  Writing the results in a diary or log helps the doctor know how to care for you.  Your blood sugar log should have the time, date and the results.  Also, write down the amount of insulin or other medicine that you take.  Other information, like what you ate, exercise done and how you were feeling, will also be helpful.     Notes:      Track and Manage My Blood Pressure-Hypertension       Timeframe:  Long-Range Goal Priority:  High Start Date:                             Expected End Date:                       Follow Up Date 06/08/21    - check blood pressure 3 times per week - choose a place to take my blood pressure (home, clinic or office, retail store) - write blood pressure results in a log or diary     Why is this important?   You won't feel high blood pressure, but it can still hurt your blood vessels.  High blood pressure can cause heart or kidney problems. It can also cause a stroke.  Making lifestyle changes like losing a little weight or eating less salt will help.  Checking your blood pressure at home and at different times of the day can help to control blood pressure.  If the doctor prescribes medicine remember to take it the way the doctor ordered.  Call the office if you cannot afford the medicine or if there are questions about it.     Notes:        Patient Care Plan: CCM Pharmacy Care Plan  Problem Identified: Problem: Hypertension, Hyperlipidemia, Diabetes, and GERD      Long-Range Goal: Patient-Specific Goal   Start Date: 03/11/2021  Expected End Date: 03/11/2022  This Visit's Progress: On track  Priority: High  Note:   Current Barriers:  Unable to independently monitor therapeutic efficacy Unable to achieve control of diabetes and hypertension  Unable to self administer medications as prescribed  Pharmacist Clinical Goal(s):  Patient will verbalize ability to afford treatment regimen achieve adherence to monitoring guidelines and medication adherence to achieve therapeutic efficacy achieve control of diabetes and hypertension as evidenced by A1c and blood pressure readings  through collaboration with PharmD and provider.   Interventions: 1:1 collaboration with [name], NP regarding development and update of comprehensive plan of care as evidenced by provider attestation and co-signature Inter-disciplinary care team collaboration (see longitudinal plan of care) Comprehensive medication review performed; medication list updated in electronic medical record  Hypertension (BP goal <130/80) -Uncontrolled -Current treatment: Amlodipine 10 mg 1 tablet daily - in AM - Appropriate, Query effective, Safe, Accessible Olmesartan 40 mg 1 tablet daily - in AM  - Appropriate, Query effective, Safe, Accessible -Medications previously tried: HCTZ (unknown reaction)  -Current home readings: 205; usually high 100s - 200s; Omron arm cuff   -Current dietary habits: using Himalayan salt; recommended lower sodium salt; no more canned foods, more frozen foods now; [place] lunch meat  -Current exercise habits: not doing anything super consistently -Reports hypotensive/hypertensive symptoms -Educated on BP goals and benefits of medications for prevention of heart attack, stroke and kidney damage; Daily salt intake goal < 2300 mg; Exercise goal of 150 minutes per week; Importance of home blood pressure monitoring; Proper BP monitoring technique; -Counseled to monitor BP at home every other day, document, and provide log at future appointments -Counseled on diet and exercise extensively Recommended to continue current medication  Hyperlipidemia: (LDL goal < 70) -Uncontrolled -Current treatment: No medications -Medications previously tried: none  -Current dietary patterns: chicken and fish and not much red meat -Current exercise habits: nothing consistently -Educated on Cholesterol goals;  Benefits of statin for ASCVD risk reduction; Importance of limiting foods high in cholesterol; -Counseled on diet and exercise extensively Recommended moderate intensity statin.  Diabetes (A1c goal <7%) -Uncontrolled -Current medications: Levemir 100 units/ml inject 20 units twice daily - Appropriate, Query effective, Safe, Accessible Glipizide XL 5 mg  1 tablet daily with breakfast - Appropriate, Query effective, Safe, Accessible -Medications previously tried: Novolin, Januvia (chest pain), Jardiance (yeast infections), Toujeo, Tresiba, metformin (diarrhea) -Current home glucose readings fasting glucose: 260 (not checking consistently) post prandial glucose: n/a -Denies hypoglycemic/hyperglycemic symptoms -Current meal patterns: 2 meals a day breakfast: spinach,  onions and eggs, sausage  lunch: not often  dinner: fish, chicken, kale; prime rib once in a blue moon; pasta salad snacks: peanuts, almonds, popcorn, apples, oranges, lemons; not many sweets, plain yogurt drinks: green tea, water, coffee (once a week), hibiscus tea -Current exercise: was doing an exercise class at the gym; postponed because of the holidays and COVID -Educated on A1c and blood sugar goals; Exercise goal of 150 minutes per week; Benefits of routine self-monitoring of blood sugar; Continuous glucose monitoring; Carbohydrate counting and/or plate method -Counseled to check feet daily and get yearly eye exams -Recommended Ozempic for A1c lowering.  Osteopenia (Goal prevent fractures) -Not ideally controlled -Last DEXA Scan: 2016   T-Score femoral neck: -1.5  T-Score total hip: n/a  T-Score lumbar spine: -1.3  T-Score forearm radius: n/a  10-year probability of major  osteoporotic fracture: 4.9%  10-year probability of hip fracture: 0.9% -Patient is not a candidate for pharmacologic treatment -Current treatment  Multivitamin 1 tablet daily -Medications previously tried: none  -Recommend [phone] units of vitamin D daily. Recommend 1200 mg of calcium daily from dietary and supplemental sources. -Recommended scheduling repeat bone density scan.  Health Maintenance -Vaccine gaps: none -Current therapy:  Ibuprofen 200 mg as needed Aspirin 81 mg 1 tablet as needed -Educated on Cost vs benefit of each product must be carefully weighed by individual consumer -Patient is satisfied with current therapy and denies issues -Counseled on risk vs benefits of aspirin therapy for primary prevention.  Patient Goals/Self-Care Activities Patient will:  - take medications as prescribed as evidenced by patient report and record review check glucose daily, document, and provide at future appointments check blood pressure a few times a week, document, and provide at future  appointments target a minimum of 150 minutes of moderate intensity exercise weekly  Follow Up Plan: The care management team will reach out to the patient again over the next 14 days.       Ms. Usman was given information about Chronic Care Management services today including:  CCM service includes personalized support from designated clinical staff supervised by her physician, including individualized plan of care and coordination with other care providers 24/7 contact phone numbers for assistance for urgent and routine care needs. Standard insurance, coinsurance, copays and deductibles apply for chronic care management only during months in which we provide at least 20 minutes of these services. Most insurances cover these services at 100%, however patients may be responsible for any copay, coinsurance and/or deductible if applicable. This service may help you avoid the need for more expensive face-to-face services. Only one practitioner may furnish and bill the service in a calendar month. The patient may stop CCM services at any time (effective at the end of the month) by phone call to the office staff.  Patient agreed to services and verbal consent obtained.   Patient verbalizes understanding of instructions and care plan provided today and agrees to view in MyChart. Active MyChart status confirmed with patient.   The pharmacy team will reach out to the patient again over the next 14 days.   Verner Chol, [facility]

## 2021-03-19 ENCOUNTER — Telehealth: Payer: Self-pay | Admitting: Pharmacist

## 2021-03-19 ENCOUNTER — Other Ambulatory Visit: Payer: Self-pay | Admitting: Adult Health

## 2021-03-19 DIAGNOSIS — E1165 Type 2 diabetes mellitus with hyperglycemia: Secondary | ICD-10-CM

## 2021-03-19 MED ORDER — OZEMPIC (0.25 OR 0.5 MG/DOSE) 2 MG/1.5ML ~~LOC~~ SOPN: 0.5000 mg | PEN_INJECTOR | SUBCUTANEOUS | 0 refills | Status: DC

## 2021-03-19 MED ORDER — SPIRONOLACTONE 25 MG PO TABS: 25.0000 mg | ORAL_TABLET | Freq: Every day | ORAL | 0 refills | Status: DC

## 2021-03-19 NOTE — Telephone Encounter (Signed)
Called patient to make her aware of the changes with her medications. Plan to start spironolactone daily and Ozempic 0.25 mg once weekly.   Will route to PCP to send in the prescriptions to her [facility] pharmacy. Will check back in next Tuesday on status of Levemir and if this needs to be changed to available insulins.

## 2021-03-25 ENCOUNTER — Telehealth: Payer: Self-pay | Admitting: Pharmacist

## 2021-03-25 DIAGNOSIS — I10 Essential (primary) hypertension: Secondary | ICD-10-CM | POA: Diagnosis not present

## 2021-03-25 DIAGNOSIS — Z794 Long term (current) use of insulin: Secondary | ICD-10-CM

## 2021-03-25 DIAGNOSIS — E1165 Type 2 diabetes mellitus with hyperglycemia: Secondary | ICD-10-CM

## 2021-03-25 NOTE — Chronic Care Management (AMB) (Cosign Needed)
° ° °  Chronic Care Management Pharmacy Assistant   Name: Katrina Garrison  MRN: [id number] DOB: September 22, 1940  Reason for Encounter: Medication Review / Onboarding for [facility]   Conditions to be addressed/monitored: HTN, HLD, DMII, and GERD  Recent office visits:  None  Recent consult visits:  None  Hospital visits:  None  Medications: Outpatient Encounter Medications as of 03/25/2021  Medication Sig   amLODipine (NORVASC) 10 MG tablet Take 1 tablet (10 mg total) by mouth daily.   aspirin 81 MG tablet Take 81 mg by mouth as needed.   Continuous Blood Gluc Sensor (FREESTYLE LIBRE 14 DAY SENSOR) MISC Use with smart phone (Patient not taking: Reported on 03/11/2021)   glipiZIDE (GLUCOTROL XL) 5 MG 24 hr tablet Take 1 tablet (5 mg total) by mouth daily with breakfast.   glucose blood (FREESTYLE LITE) test strip Use as instructed   glucose monitoring kit (FREESTYLE) monitoring kit 1 each by Does not apply route as needed for other.   ibuprofen (ADVIL,MOTRIN) 200 MG tablet Take 200 mg by mouth every 6 (six) hours as needed. Reported on 03/22/2015   Insulin Pen Needle (RELION PEN NEEDLES) 31G X 6 MM MISC Use daily   Lancets (FREESTYLE) lancets Use as instructed   LEVEMIR FLEXTOUCH 100 UNIT/ML FlexTouch Pen INJECT 35 UNITS SUBCUTANEOUSLY AT BEDTIME   Multiple Vitamins-Minerals (MULTIPLE VITAMINS/WOMENS PO) Take by mouth.   olmesartan (BENICAR) 40 MG tablet Take 1 tablet (40 mg total) by mouth daily.   Semaglutide,0.25 or 0.5MG/DOS, (OZEMPIC, 0.25 OR 0.5 MG/DOSE,) 2 MG/1.5ML SOPN Inject 0.5 mg into the skin once a week.   spironolactone (ALDACTONE) 25 MG tablet Take 1 tablet (25 mg total) by mouth daily.   No facility-administered encounter medications on file as of 03/25/2021.     Referred by: Dorothyann Peng, NP  Reason for referral: Chronic care management  Reviewed chart for medication changes ahead of medication coordination call.  BP Readings from Last 3 Encounters:  01/28/21 (!)  200/100  12/11/20 (!) 160/100  11/08/20 (!) 160/90    Lab Results  Component Value Date   HGBA1C 10.5 (H) 01/28/2021     Verbal consent obtained for [facility] enhanced pharmacy services (medication synchronization, adherence packaging, delivery coordination). A medication sync plan was created to allow patient to get all medications delivered once every 30 to 90 days per patient preference. Patient understands they have freedom to choose pharmacy and clinical pharmacist will coordinate care between all prescribers and [facility].  Patient requested to obtain medications through Adherence Packaging  30 Days   Med Sync Plan:  Patient will need a short fill of (med), prior to adherence delivery.   Prescriptions come from PCP .   Care Gaps: AWV - completed 10/08/2020 Last BP - 200/100 on 01/28/2021 Last A1C - 10.5 on 01/28/2021 Hep C screen - never done Covid booster - overdue Malb - overdue  Star Rating Drugs: Glipizide 5 mg - last fill date 01/30/2021 90 DS at [facility] Levemir Flextouch 100 un/ml - last filled 03/19/2021 90 DS at [facility] Olmesartan 40 mg - last filled 11/10/2020 90 DS at [facility] Pharmacist Assistant [phone]

## 2021-04-01 ENCOUNTER — Telehealth: Payer: Self-pay

## 2021-04-01 DIAGNOSIS — E1165 Type 2 diabetes mellitus with hyperglycemia: Secondary | ICD-10-CM

## 2021-04-01 DIAGNOSIS — Z794 Long term (current) use of insulin: Secondary | ICD-10-CM

## 2021-04-01 NOTE — Telephone Encounter (Signed)
-----   Message from Verner Chol, [facility] sent at 04/01/2021  9:01 AM EST ----- ?Regarding: Refills ?Hi, ? ?Ms. Estorga is going to try [facility] for adherence packaging. Can you please send refills of the following to them? ?-amlodipine ?-spironolactone ?-Levemir ?-olmesartan ?-glipizide ? ?Thanks! ?Maddie ? ?

## 2021-04-01 NOTE — Telephone Encounter (Signed)
-----   Message from Madeline G Pryor, RPH sent at 04/01/2021  9:01 AM EST ----- ?Regarding: Refills ?Hi, ? ?Katrina Garrison is going to try [facility] for adherence packaging. Can you please send refills of the following to them? ?-amlodipine ?-spironolactone ?-Levemir ?-olmesartan ?-glipizide ? ?Thanks! ?Maddie ? ?

## 2021-04-01 NOTE — Telephone Encounter (Signed)
I will send everything to [facility] when pt comes in for her DM visit in April. To better maintain medication adherence.  ?

## 2021-04-02 ENCOUNTER — Other Ambulatory Visit: Payer: Self-pay

## 2021-04-02 DIAGNOSIS — H04123 Dry eye syndrome of bilateral lacrimal glands: Secondary | ICD-10-CM | POA: Diagnosis not present

## 2021-04-02 DIAGNOSIS — Z961 Presence of intraocular lens: Secondary | ICD-10-CM | POA: Diagnosis not present

## 2021-04-02 DIAGNOSIS — I10 Essential (primary) hypertension: Secondary | ICD-10-CM

## 2021-04-02 DIAGNOSIS — E113393 Type 2 diabetes mellitus with moderate nonproliferative diabetic retinopathy without macular edema, bilateral: Secondary | ICD-10-CM | POA: Diagnosis not present

## 2021-04-02 DIAGNOSIS — R6889 Other general symptoms and signs: Secondary | ICD-10-CM | POA: Diagnosis not present

## 2021-04-02 DIAGNOSIS — E1165 Type 2 diabetes mellitus with hyperglycemia: Secondary | ICD-10-CM

## 2021-04-02 DIAGNOSIS — Z794 Long term (current) use of insulin: Secondary | ICD-10-CM

## 2021-04-02 DIAGNOSIS — H35032 Hypertensive retinopathy, left eye: Secondary | ICD-10-CM | POA: Diagnosis not present

## 2021-04-02 LAB — HM DIABETES EYE EXAM

## 2021-04-02 MED ORDER — LEVEMIR FLEXTOUCH 100 UNIT/ML ~~LOC~~ SOPN: PEN_INJECTOR | SUBCUTANEOUS | 0 refills | Status: DC

## 2021-04-02 MED ORDER — OZEMPIC (0.25 OR 0.5 MG/DOSE) 2 MG/1.5ML ~~LOC~~ SOPN: 0.5000 mg | PEN_INJECTOR | SUBCUTANEOUS | 0 refills | Status: DC

## 2021-04-02 MED ORDER — AMLODIPINE BESYLATE 10 MG PO TABS: 10.0000 mg | ORAL_TABLET | Freq: Every day | ORAL | 3 refills | Status: DC

## 2021-04-02 MED ORDER — SPIRONOLACTONE 25 MG PO TABS: 25.0000 mg | ORAL_TABLET | Freq: Every day | ORAL | 0 refills | Status: DC

## 2021-04-02 MED ORDER — GLIPIZIDE ER 5 MG PO TB24: 5.0000 mg | ORAL_TABLET | Freq: Every day | ORAL | 0 refills | Status: DC

## 2021-04-02 MED ORDER — OLMESARTAN MEDOXOMIL 40 MG PO TABS: 40.0000 mg | ORAL_TABLET | Freq: Every day | ORAL | 3 refills | Status: DC

## 2021-04-02 NOTE — Telephone Encounter (Signed)
Spoke to pt and she stated that she had some questions about the new pharmacy. Pt stated that she will call the pharmacy to ask them. No further actions needed! ?

## 2021-04-02 NOTE — Telephone Encounter (Signed)
After speaking to pharmacist. All medications has been [facility] to [facility] as requested. Pt notified of update. No further actions needed.  ?

## 2021-04-02 NOTE — Telephone Encounter (Signed)
Noted! Pt stated that she has enough of all her medication except for the glipizide. Pt requesting the glipizide be sent to [facility] and when she comes in April send everything to [facility]. Glipizide has been sent to pharmacy.  ?

## 2021-04-02 NOTE — Telephone Encounter (Signed)
Pt is calling back and would like kendra to return her call ?

## 2021-04-03 ENCOUNTER — Encounter: Payer: Self-pay | Admitting: Internal Medicine

## 2021-04-04 ENCOUNTER — Encounter: Payer: Self-pay | Admitting: Adult Health

## 2021-04-08 ENCOUNTER — Encounter: Payer: Self-pay | Admitting: Adult Health

## 2021-04-09 ENCOUNTER — Telehealth: Payer: Self-pay | Admitting: Adult Health

## 2021-04-09 NOTE — Telephone Encounter (Signed)
Elease Hashimoto from pharmacy advised that pt is now using [facility].  ?

## 2021-04-09 NOTE — Telephone Encounter (Signed)
Katrina Garrison with [facility] is calling in requesting a medication refill for amLODipine (NORVASC) 10 MG tablet [[id number]] , insulin detemir (LEVEMIR FLEXTOUCH) 100 UNIT/ML FlexPen [[id number]] , glipiZIDE (GLUCOTROL XL) 5 MG 24 hr tablet [[id number]] , olmesartan (BENICAR) 40 MG tablet [[id number]], and Ozempic to be sent to their pharmacy. ? ?Please advise. ?

## 2021-04-10 ENCOUNTER — Telehealth: Payer: Self-pay | Admitting: Pharmacist

## 2021-04-10 NOTE — Chronic Care Management (AMB) (Signed)
? ? ?  Chronic Care Management ?Pharmacy Assistant  ? ?Name: Katrina Garrison  MRN: [id number] DOB: 11/20/1940 ? ?Reason for Encounter: Medication Review / Medication Coordination Call ?  ?Conditions to be addressed/monitored: ?HTN ? ? ?Recent office visits:  ?None ? ?Recent consult visits:  ?None ? ?Hospital visits:  ?None ? ?Medications: ?Outpatient Encounter Medications as of 04/10/2021  ?Medication Sig  ? amLODipine (NORVASC) 10 MG tablet Take 1 tablet (10 mg total) by mouth daily.  ? aspirin 81 MG tablet Take 81 mg by mouth as needed.  ? Continuous Blood Gluc Sensor (FREESTYLE LIBRE 14 DAY SENSOR) MISC Use with smart phone (Patient not taking: Reported on 03/11/2021)  ? glipiZIDE (GLUCOTROL XL) 5 MG 24 hr tablet Take 1 tablet (5 mg total) by mouth daily with breakfast.  ? glucose blood (FREESTYLE LITE) test strip Use as instructed  ? glucose monitoring kit (FREESTYLE) monitoring kit 1 each by Does not apply route as needed for other.  ? ibuprofen (ADVIL,MOTRIN) 200 MG tablet Take 200 mg by mouth every 6 (six) hours as needed. Reported on 03/22/2015  ? insulin detemir (LEVEMIR FLEXTOUCH) 100 UNIT/ML FlexPen INJECT 35 UNITS SUBCUTANEOUSLY AT BEDTIME  ? Insulin Pen Needle (RELION PEN NEEDLES) 31G X 6 MM MISC Use daily  ? Lancets (FREESTYLE) lancets Use as instructed  ? Multiple Vitamins-Minerals (MULTIPLE VITAMINS/WOMENS PO) Take by mouth.  ? olmesartan (BENICAR) 40 MG tablet Take 1 tablet (40 mg total) by mouth daily.  ? Semaglutide,0.25 or 0.5MG /DOS, (OZEMPIC, 0.25 OR 0.5 MG/DOSE,) 2 MG/1.5ML SOPN Inject 0.5 mg into the skin once a week.  ? spironolactone (ALDACTONE) 25 MG tablet Take 1 tablet (25 mg total) by mouth daily.  ? ?No facility-administered encounter medications on file as of 04/10/2021.  ? ?Reviewed chart for medication changes ahead of medication coordination call. ? ?No OVs, Consults, or hospital visits since last care coordination call/Pharmacist visit. (If appropriate, list visit date, provider  name) ? ?No medication changes indicated OR if recent visit, treatment plan here. ? ?BP Readings from Last 3 Encounters:  ?01/28/21 (!) 200/100  ?12/11/20 (!) 160/100  ?11/08/20 (!) 160/90  ?  ?Lab Results  ?Component Value Date  ? HGBA1C 10.5 (H) 01/28/2021  ?  ? ?Patient obtains medications through Adherence Packaging  30 Days  ? ?Last adherence delivery included: Onboarding ? ? ? ?Patient is due for next adherence delivery on: 04/22/2021. ?Called patient and reviewed medications and coordinated delivery. ? ?This delivery to include: ?Glipizide ER 5 mg - take 1 tablet with dinner ?Olmesartan 40 mg  - take 1 tablet with dinner ?Spironolactone 25 mg - take 1 tablet at breakfast ?Amlodipine 10 mg  - take 1 tablet at breakfast ?Ozempic 2/3 ml - inject 0.5 mg weekly ? ?Patient will need a short fill: No short fills needed  ? ?Coordinated acute fill: No acute fills needed  ? ?Patient declined the following medications: No medications declined.  ? ?Confirmed delivery date of 04/22/2021, advised patient that pharmacy will contact them the morning of delivery. ? ? ?Care Gaps: ?AWV - scheduled 10/21/2021 ?Last BP - 200/100 on 01/28/2021 ?Last A1C - 10.5 on 01/28/2021 ?Covid booster - overdue ?  ?Star Rating Drugs: ?Glipizide 5 mg - last fill date 04/03/2021 18 DS at [facility] ?Levemir Flextouch 100 un/ml - last filled 03/19/2021 90 DS at [facility] ?Olmesartan 40 mg - on this order with [facility] ? ?Gennie Alma CMA  ?Clinical Pharmacist Assistant ?[phone] ? ?

## 2021-04-22 ENCOUNTER — Other Ambulatory Visit: Payer: Self-pay | Admitting: Adult Health

## 2021-04-22 ENCOUNTER — Telehealth: Payer: Self-pay | Admitting: Adult Health

## 2021-04-22 MED ORDER — RELION PEN NEEDLES 31G X 6 MM MISC
3 refills | Status: DC
Start: 2021-04-22 — End: 2022-01-13

## 2021-04-22 NOTE — Telephone Encounter (Signed)
Spoke to pt and she stated she did not call for any refills. Pt stated that her [facility] are coming from [facility].  ?

## 2021-04-22 NOTE — Telephone Encounter (Signed)
[facility] pharmacy called because patient called to request that they send PCP request for her prescriptions to go there. Patient wants all her regular prescriptions to go to [facility] pharmacy from now on. ? ? ? ?olmesartan (BENICAR) 40 MG tablet ? ?spironolactone (ALDACTONE) 25 MG tablet ? ?glipiZIDE (GLUCOTROL XL) 5 MG 24 hr tablet ?amLODipine (NORVASC) 10 MG tablet ? ?insulin detemir (LEVEMIR FLEXTOUCH) 100 UNIT/ML FlexPen ? ?Semaglutide,0.25 or 0.5MG /DOS, (OZEMPIC, 0.25 OR 0.5 MG/DOSE,) 2 MG/1.5ML SOPN ? ?Patient is requesting accucheck test strips as freestyle is not covered under [employer] ? ?Insulin Pen Needle (RELION PEN NEEDLES) 31G X 6 MM MISC ? ? ? ? ?Please advise  ?

## 2021-04-29 ENCOUNTER — Encounter: Payer: Self-pay | Admitting: Adult Health

## 2021-04-29 ENCOUNTER — Ambulatory Visit (INDEPENDENT_AMBULATORY_CARE_PROVIDER_SITE_OTHER): Payer: Medicare HMO | Admitting: Adult Health

## 2021-04-29 VITALS — BP 140/72 | HR 91 | Temp 98.6°F | Ht 61.0 in | Wt 133.0 lb

## 2021-04-29 DIAGNOSIS — E1165 Type 2 diabetes mellitus with hyperglycemia: Secondary | ICD-10-CM

## 2021-04-29 DIAGNOSIS — Z794 Long term (current) use of insulin: Secondary | ICD-10-CM

## 2021-04-29 DIAGNOSIS — I10 Essential (primary) hypertension: Secondary | ICD-10-CM | POA: Diagnosis not present

## 2021-04-29 DIAGNOSIS — R6889 Other general symptoms and signs: Secondary | ICD-10-CM | POA: Diagnosis not present

## 2021-04-29 LAB — POCT GLYCOSYLATED HEMOGLOBIN (HGB A1C): HbA1c, POC (prediabetic range): 8.6 % — AB (ref 5.7–6.4)

## 2021-04-29 MED ORDER — SPIRONOLACTONE 25 MG PO TABS: 25.0000 mg | ORAL_TABLET | Freq: Every day | ORAL | 3 refills | Status: DC

## 2021-04-29 MED ORDER — GLUCOSE BLOOD VI STRP: ORAL_STRIP | 12 refills | Status: DC

## 2021-04-29 MED ORDER — OZEMPIC (1 MG/DOSE) 4 MG/3ML ~~LOC~~ SOPN
1.0000 mg | PEN_INJECTOR | SUBCUTANEOUS | 2 refills | Status: AC
Start: 2021-04-29 — End: 2021-07-28

## 2021-04-29 NOTE — Progress Notes (Signed)
? ?Subjective:  ? ? Patient ID: Katrina Garrison, female    DOB: 1940/07/27, 81 y.o.   MRN: [id number] ? ?HPI ?81 year old female who  has a past medical history of Anemia, Diabetes mellitus type II (2005), Diverticulosis, GERD (gastroesophageal reflux disease), History of lithotripsy, Hypertension, and Kidney stones. ? ?She presents to the office today for 3 month follow up regarding DM and HTN  ? ?Uncontrolled Type 2 DM - She has been uncontrolled for some time now. she is currently managed with Levemir 20 units BID,  glipizide 5 mg extended release daily, Ozempic 0.5 mg weekly. She has been checkingher blood sugars at home with readings in the 100-160's. Denies hypogycemic events  ?Lab Results  ?Component Value Date  ? HGBA1C 8.6 (A) 04/29/2021  ? ?Wt Readings from Last 3 Encounters:  ?04/29/21 133 lb (60.3 kg)  ?01/28/21 135 lb (61.2 kg)  ?12/11/20 132 lb (59.9 kg)  ? ?In the past she has been intolerant to the following diabetic medications ?-Toujeo-rash and itching ?-Metformin - blood in stool and diarrhea ?-Januvia-chest pain ?- Jardiance - caused yeast infections  ? ? ?Hypertension-prescribed Norvasc 10 mg daily spironolactone 25 mg daily and Benicar 40 mg daily.She does check her blood pressures at home with readings pretty consistently in the 140's/70's.  ? ?BP Readings from Last 3 Encounters:  ?04/29/21 140/72  ?01/28/21 (!) 200/100  ?12/11/20 (!) 160/100  ? ? ? ? ?Review of Systems ?See HPI  ? ?Past Medical History:  ?Diagnosis Date  ? Anemia   ? Diabetes mellitus type II 2005  ? Diverticulosis   ? GERD (gastroesophageal reflux disease)   ? History of lithotripsy   ? x3 for kidney stones  ? Hypertension   ? Kidney stones   ? ? ?Social History  ? ?Socioeconomic History  ? Marital status: Divorced  ?  Spouse name: Not on file  ? Number of children: Not on file  ? Years of education: Not on file  ? Highest education level: Not on file  ?Occupational History  ? Occupation: Retired  ?Tobacco Use  ? Smoking  status: Former  ?  Years: 1.50  ?  Types: Cigarettes  ?  Quit date: 01/28/1985  ?  Years since quitting: 36.2  ? Smokeless tobacco: Never  ?Substance and Sexual Activity  ? Alcohol use: No  ? Drug use: No  ? Sexual activity: Not on file  ?Other Topics Concern  ? Not on file  ?Social History Narrative  ? Not on file  ? ?Social Determinants of Health  ? ?Financial Resource Strain: Low Risk   ? Difficulty of Paying Living Expenses: Not hard at all  ?Food Insecurity: No Food Insecurity  ? Worried About Programme researcher, broadcasting/film/video in the Last Year: Never true  ? Ran Out of Food in the Last Year: Never true  ?Transportation Needs: No Transportation Needs  ? Lack of Transportation (Medical): No  ? Lack of Transportation (Non-Medical): No  ?Physical Activity: Sufficiently Active  ? Days of Exercise per Week: 3 days  ? Minutes of Exercise per Session: 60 min  ?Stress: No Stress Concern Present  ? Feeling of Stress : Not at all  ?Social Connections: Moderately Integrated  ? Frequency of Communication with Friends and Family: More than three times a week  ? Frequency of Social Gatherings with Friends and Family: More than three times a week  ? Attends Religious Services: 1 to 4 times per year  ? Active Member of  Clubs or Organizations: Yes  ? Attends Banker[employer] or Organization Meetings: 1 to 4 times per year  ? Marital Status: Divorced  ?Intimate Partner Violence: Not At Risk  ? Fear of Current or Ex-Partner: No  ? Emotionally Abused: No  ? Physically Abused: No  ? Sexually Abused: No  ? ? ?Past Surgical History:  ?Procedure Laterality Date  ? ABDOMINAL HYSTERECTOMY    ? childbirth x 2    ? EYE SURGERY    ? cataract  ? ? ?Family History  ?Problem Relation Age of Onset  ? Thyroid cancer Mother   ? Diabetes Mother   ? Hypertension Mother   ? Alcohol abuse Father   ? Hypertension Father   ? Multiple sclerosis Daughter   ? Multiple sclerosis Daughter   ? Multiple sclerosis Sister   ? ? ?Allergies  ?Allergen Reactions  ? Hydrochlorothiazide   ?  Jardiance [Empagliflozin]   ?  Yeast infections   ? Lisinopril Cough  ? Losartan Cough  ? Metformin And Related Diarrhea  ? Metoprolol Swelling  ?  Leg cramping ?  ? Novolin R [Insulin] Itching  ? Sulfamethoxazole   ?  REACTION: "comatose"  ? Toujeo Solostar [Insulin Glargine] Rash  ? ? ?Current Outpatient Medications on File Prior to Visit  ?Medication Sig Dispense Refill  ? amLODipine (NORVASC) 10 MG tablet Take 1 tablet (10 mg total) by mouth daily. 90 tablet 3  ? aspirin 81 MG tablet Take 81 mg by mouth as needed.    ? glipiZIDE (GLUCOTROL XL) 5 MG 24 hr tablet Take 1 tablet (5 mg total) by mouth daily with breakfast. 90 tablet 0  ? ibuprofen (ADVIL,MOTRIN) 200 MG tablet Take 200 mg by mouth every 6 (six) hours as needed. Reported on 03/22/2015    ? insulin detemir (LEVEMIR FLEXTOUCH) 100 UNIT/ML FlexPen INJECT 35 UNITS SUBCUTANEOUSLY AT BEDTIME (Patient taking differently: 20 Units 2 (two) times daily.) 30 mL 0  ? Insulin Pen Needle (RELION PEN NEEDLES) 31G X 6 MM MISC Use daily 90 each 3  ? Lancets (FREESTYLE) lancets Use as instructed 300 each 3  ? Multiple Vitamins-Minerals (MULTIPLE VITAMINS/WOMENS PO) Take by mouth.    ? olmesartan (BENICAR) 40 MG tablet Take 1 tablet (40 mg total) by mouth daily. 90 tablet 3  ? spironolactone (ALDACTONE) 25 MG tablet Take 1 tablet (25 mg total) by mouth daily. 30 tablet 0  ? ?No current facility-administered medications on file prior to visit.  ? ? ?BP 140/72   Pulse 91   Temp 98.6 ?F (37 ?C) (Oral)   Ht 5\' 1"  (1.549 m)   Wt 133 lb (60.3 kg)   SpO2 98%   BMI 25.13 kg/m?  ? ? ?   ?Objective:  ? Physical Exam ?Vitals and nursing note reviewed.  ?Constitutional:   ?   Appearance: Normal appearance.  ?Cardiovascular:  ?   Rate and Rhythm: Normal rate and regular rhythm.  ?   Pulses: Normal pulses.  ?   Heart sounds: Normal heart sounds.  ?Pulmonary:  ?   Effort: Pulmonary effort is normal.  ?   Breath sounds: Normal breath sounds.  ?Skin: ?   General: Skin is warm and  dry.  ?Neurological:  ?   General: No focal deficit present.  ?   Mental Status: She is alert and oriented to person, place, and time.  ?Psychiatric:     ?   Mood and Affect: Mood normal.     ?   Behavior:  Behavior normal.     ?   Thought Content: Thought content normal.     ?   Judgment: Judgment normal.  ? ?   ?Assessment & Plan:  ?1. Uncontrolled type 2 diabetes mellitus with hyperglycemia, with long-term current use of insulin (HCC) ? ?- POC HgB A1c- 8.6 - Will increase Ozempic to 1 mg weekly.  ?- Follow up in 3 months  ?- Semaglutide, 1 MG/DOSE, (OZEMPIC, 1 MG/DOSE,) 4 MG/3ML SOPN; Inject 1 mg into the skin once a week.  Dispense: 3 mL; Refill: 2 ?- glucose blood test strip; Use as instructed  Dispense: 300 each; Refill: 12 ? ?2. Essential hypertension ?- At goal for age ?- Continue current medications  ? ?[name], NP ? ? ? ?

## 2021-04-29 NOTE — Patient Instructions (Signed)
It was great seeing you today  ? ?Your A1c dropped to 8.6 - this is the best it has been in 4 years  ? ?I am going to increase your ozempic from 0.5 mg to 1 mg dosing.  ? ?Please follow up in 3 months  ? ? ?

## 2021-05-12 ENCOUNTER — Telehealth: Payer: Self-pay | Admitting: Pharmacist

## 2021-05-12 NOTE — Telephone Encounter (Signed)
Pt calling in for a refill of medication for yeast infections. Asks you send this refill asap so that you can get it in her next shipment on 05/21/21. Did not see a medication for this in her prescription hx.  ?

## 2021-05-12 NOTE — Chronic Care Management (AMB) (Signed)
? ? ?Chronic Care Management ?Pharmacy Assistant  ? ?Name: Katrina Garrison  MRN: [id number] DOB: 1940-02-20 ? ?Reason for Encounter: Medication Review / Medication Coordination Call ?  ?Conditions to be addressed/monitored: ?HTN ? ?Recent office visits:  ?04/29/2021 Katrina Frees NP - Patient was seen for Uncontrolled type 2 diabetes mellitus with hyperglycemia, with long-term current use of insulin and an additional issue. Increased Semaglutide to 1mg  weekly. Follow up in 3 months.  ? ?Recent consult visits:  ?None ? ?Hospital visits:  ?None ? ?Medications: ?Outpatient Encounter Medications as of 05/12/2021  ?Medication Sig  ? amLODipine (NORVASC) 10 MG tablet Take 1 tablet (10 mg total) by mouth daily.  ? aspirin 81 MG tablet Take 81 mg by mouth as needed.  ? glipiZIDE (GLUCOTROL XL) 5 MG 24 hr tablet Take 1 tablet (5 mg total) by mouth daily with breakfast.  ? glucose blood test strip Use as instructed  ? ibuprofen (ADVIL,MOTRIN) 200 MG tablet Take 200 mg by mouth every 6 (six) hours as needed. Reported on 03/22/2015  ? insulin detemir (LEVEMIR FLEXTOUCH) 100 UNIT/ML FlexPen INJECT 35 UNITS SUBCUTANEOUSLY AT BEDTIME (Patient taking differently: 20 Units 2 (two) times daily.)  ? Insulin Pen Needle (RELION PEN NEEDLES) 31G X 6 MM MISC Use daily  ? Multiple Vitamins-Minerals (MULTIPLE VITAMINS/WOMENS PO) Take by mouth.  ? olmesartan (BENICAR) 40 MG tablet Take 1 tablet (40 mg total) by mouth daily.  ? Semaglutide, 1 MG/DOSE, (OZEMPIC, 1 MG/DOSE,) 4 MG/3ML SOPN Inject 1 mg into the skin once a week.  ? spironolactone (ALDACTONE) 25 MG tablet Take 1 tablet (25 mg total) by mouth daily.  ? ?No facility-administered encounter medications on file as of 05/12/2021.  ? ?Reviewed chart for medication changes ahead of medication coordination call. ? ?No OVs, Consults, or hospital visits since last care coordination call/Pharmacist visit. (If appropriate, list visit date, provider name) ? ?No medication changes indicated OR  if recent visit, treatment plan here. ? ?BP Readings from Last 3 Encounters:  ?04/29/21 140/72  ?01/28/21 (!) 200/100  ?12/11/20 (!) 160/100  ?  ?Lab Results  ?Component Value Date  ? HGBA1C 8.6 (A) 04/29/2021  ?  ? ?Patient obtains medications through Adherence Packaging  30 Days  ?  ?Last adherence delivery included:  ?Glipizide ER 5 mg - take 1 tablet with dinner ?Olmesartan 40 mg  - take 1 tablet with dinner ?Spironolactone 25 mg - take 1 tablet at breakfast ?Amlodipine 10 mg  - take 1 tablet at breakfast ?Ozempic 2/3 ml - inject 0.5 mg weekly ?  ? Patient declined the following medications: No medications declined.  ? ? ?Patient is due for next adherence delivery on: 05/21/2021 ? ?Called patient and reviewed medications and coordinated delivery. ?  ?This delivery to include: ?Glipizide ER 5 mg - take 1 tablet with dinner ?Olmesartan 40 mg  - take 1 tablet with dinner ?Spironolactone 25 mg - take 1 tablet at breakfast ?Amlodipine 10 mg  - take 1 tablet at breakfast ?Ozempic 4mg /53ml - inject 1 mg weekly ?  ?Patient will need a short fill: No short fills needed  ?  ?Coordinated acute fill: No acute fills needed  ?  ?Patient declined the following medications:  ?Levemir Flextouch, plenty on hand ?Relion pen needles, plenty on hand ?Test strips, plenty on hand ?  ?Confirmed delivery date of 05/21/2021, advised patient that pharmacy will contact them the morning of delivery. ? ?Patient feels she has a yeast infection, she is calling [facility] for  a script of Fluconazole to sent to [facility].  She is fine with this script being sent with her order.  (She states she is using some natural remedies that is keeping is subdued for now.) ?  ?Care Gaps: ?AWV - scheduled 10/21/2021 ?Last BP - 140/72 on 04/29/2021 ?Last A1C - 10.5 on 01/28/2021 ?Covid booster - overdue ?Malb - overdue ?  ?Star Rating Drugs: ?Glipizide 5 mg - last fill date 04/17/2021 30 DS at [facility] ?Levemir Flextouch 100 un/ml - last filled 03/19/2021 90  DS at [facility] ?Olmesartan 40 mg - last filled 04/16/2021 30 DS at [facility] ?Ozempic 1mg /ml - last filled 04/16/2021 28 DS at [facility] ?  ? ?04/18/2021 CMA  ?Clinical Pharmacist Assistant ?[phone] ? ?

## 2021-05-14 MED ORDER — FLUCONAZOLE 150 MG PO TABS: 150.0000 mg | ORAL_TABLET | Freq: Once | ORAL | 1 refills | Status: AC

## 2021-05-14 NOTE — Addendum Note (Signed)
Addended by: Waymon Amato R on: 05/14/2021 11:42 AM ? ? Modules accepted: Orders ? ?

## 2021-05-14 NOTE — Telephone Encounter (Signed)
Patient notified of update  and verbalized understanding. 

## 2021-05-14 NOTE — Telephone Encounter (Signed)
Ok for refill? 

## 2021-05-19 DIAGNOSIS — E1165 Type 2 diabetes mellitus with hyperglycemia: Secondary | ICD-10-CM | POA: Diagnosis not present

## 2021-05-20 ENCOUNTER — Other Ambulatory Visit: Payer: Self-pay

## 2021-05-20 DIAGNOSIS — E1151 Type 2 diabetes mellitus with diabetic peripheral angiopathy without gangrene: Secondary | ICD-10-CM | POA: Diagnosis not present

## 2021-05-20 DIAGNOSIS — M21612 Bunion of left foot: Secondary | ICD-10-CM | POA: Diagnosis not present

## 2021-05-20 DIAGNOSIS — I739 Peripheral vascular disease, unspecified: Secondary | ICD-10-CM | POA: Diagnosis not present

## 2021-05-20 DIAGNOSIS — M21611 Bunion of right foot: Secondary | ICD-10-CM | POA: Diagnosis not present

## 2021-05-20 MED ORDER — FLUCONAZOLE 150 MG PO TABS: 150.0000 mg | ORAL_TABLET | Freq: Every day | ORAL | 1 refills | Status: DC

## 2021-05-26 ENCOUNTER — Telehealth: Payer: Self-pay | Admitting: Adult Health

## 2021-05-26 NOTE — Telephone Encounter (Signed)
Pt requesting call regarding Dexcom 7. She just received it in the mail.  ?

## 2021-05-28 ENCOUNTER — Telehealth: Payer: Self-pay | Admitting: Adult Health

## 2021-05-28 NOTE — Telephone Encounter (Signed)
Pt wanted to know if she needed to come in to have the Dexcom placed. I advised pt that it was self administered pt verbalized understanding.  ?

## 2021-05-28 NOTE — Telephone Encounter (Signed)
Pt call and stated she want Enrique Sack to give her a call she didn't give me any information . ?

## 2021-05-30 NOTE — Telephone Encounter (Signed)
FYI Spoke to pt and she stated that she has been having a URI. PT stated tha tsx started 3 days ago. Pt tested for Covid on the 2nd day test was negative. Pt cough sounded really bad objectively. Advised pt since there are no appt.to go to local Urgent Care. Pt verbalized understanding.   ?

## 2021-05-30 NOTE — Telephone Encounter (Signed)
Pt requesting Katrina Garrison call her regarding her continuous cough. Concerned about mixing medications as she is diabetic.  ?

## 2021-06-09 ENCOUNTER — Other Ambulatory Visit: Payer: Self-pay | Admitting: Adult Health

## 2021-06-09 DIAGNOSIS — E1165 Type 2 diabetes mellitus with hyperglycemia: Secondary | ICD-10-CM

## 2021-06-10 ENCOUNTER — Telehealth: Payer: Self-pay | Admitting: Pharmacist

## 2021-06-10 NOTE — Chronic Care Management (AMB) (Signed)
? ? ?  Chronic Care Management ?Pharmacy Assistant  ? ?Name: Katrina Garrison  MRN: [id number] DOB: 26-Jan-1941 ? ?06/11/2021 APPOINTMENT REMINDER ? ?Katrina Garrison was reminded to have all medications, supplements and any blood glucose and blood pressure readings available for review with Gaylord Shih, Pharm. D, at her telephone visit on 06/11/2021 at 4:30. ? ? ?Care Gaps: ?AWV - scheduled 10/21/2021 ?Last BP - 140/72 on 04/29/2021 ?Last A1C - 8.6 on 04/29/2021 ?Covid booster - overdue ?Malb - overdue ? ?Star Rating Drug: ?Glipzide 5 mg - last filled 05/15/2021 30 DS at [facility] ?Levemir Flextouch 100 un/ml - last filled 03/19/2021 90 DS at [facility] ?Olmesartan 40 mg - last filled 05/15/2021 30 DS at [facility] ?Ozempic 1mg  dose - last filled 05/15/2021 28 DS at [facility] ? ?Any gaps in medications fill history? No ? ?05/17/2021 CMA  ?Clinical Pharmacist Assistant ?[phone] ? ?

## 2021-06-11 ENCOUNTER — Ambulatory Visit (INDEPENDENT_AMBULATORY_CARE_PROVIDER_SITE_OTHER): Payer: Medicare HMO | Admitting: Pharmacist

## 2021-06-11 DIAGNOSIS — E1165 Type 2 diabetes mellitus with hyperglycemia: Secondary | ICD-10-CM

## 2021-06-11 DIAGNOSIS — I10 Essential (primary) hypertension: Secondary | ICD-10-CM

## 2021-06-11 NOTE — Progress Notes (Signed)
Chronic Care Management Pharmacy Note  06/11/2021 Name:  Katrina Garrison MRN:  [id number] DOB:  1940/07/20  Summary: BP not quite at goal < 140/90 A1c not at goal < 7%  Recommendations/Changes made from today's visit: -Recommended monitoring blood sugars and blood pressure in the evening to determine trends -Recommend decreasing evening dose of Levemir to 16 units to avoid hypoglycemic unawareness  Plan: BP and DM assessment in 1 month Follow up in 2 months  Subjective: Katrina Garrison is an 81 y.o. year old female who is a primary patient of Dorothyann Peng, NP.  The CCM team was consulted for assistance with disease management and care coordination needs.    Engaged with patient by telephone for follow up visit in response to provider referral for pharmacy case management and/or care coordination services.   Consent to Services:  The patient was given information about Chronic Care Management services, agreed to services, and gave verbal consent prior to initiation of services.  Please see initial visit note for detailed documentation.   Patient Care Team: Dorothyann Peng, NP as PCP - General (Family Medicine) Viona Gilmore, [facility] as Pharmacist (Pharmacist)  Recent office visits: 04/29/2021 Dorothyann Peng NP: Patient was seen for Uncontrolled type 2 diabetes mellitus with hyperglycemia, with long-term current use of insulin and an additional issue. Increased Semaglutide to 54m weekly. Follow up in 3 months.   01/28/2021 CDorothyann PengNP - Patient was seen for Encounter for general adult medical examination with abnormal findings and additional issues. Discontinued Jardiance and Diflucan. No follow up noted.   12/11/2020 CDorothyann PengNP - Patient was seen for yeast infection and additional issues. Started Diflucan 150 mg. Restarted Jardiance. No follow up noted.  Recent consult visits: None  Hospital visits: None in previous 6 months   Objective:  Lab Results   Component Value Date   CREATININE 0.97 01/28/2021   BUN 19 01/28/2021   GFR 55.43 (L) 01/28/2021   GFRNONAA 51 (L) 11/03/2019   GFRAA 59 (L) 11/03/2019   NA 138 01/28/2021   K 3.9 01/28/2021   CALCIUM 9.4 01/28/2021   CO2 26 01/28/2021   GLUCOSE 238 (H) 01/28/2021    Lab Results  Component Value Date/Time   HGBA1C 8.6 (A) 04/29/2021 09:42 AM   HGBA1C 10.5 (H) 01/28/2021 10:25 AM   HGBA1C 8.0 (A) 11/08/2020 01:37 PM   HGBA1C 8.9 (A) 08/09/2020 01:34 PM   HGBA1C 10.0 (H) 04/24/2020 10:17 AM   GFR 55.43 (L) 01/28/2021 10:25 AM   GFR 52.47 (L) 04/24/2020 10:17 AM   MICROALBUR 103.8 (H) 01/25/2020 02:40 PM   MICROALBUR 1.98 (H) 10/14/2012 08:47 AM    Last diabetic Eye exam:  Lab Results  Component Value Date/Time   HMDIABEYEEXA Retinopathy (A) 04/02/2021 12:00 AM    Last diabetic Foot exam: No results found for: HMDIABFOOTEX   Lab Results  Component Value Date   CHOL 154 01/28/2021   HDL 44.90 01/28/2021   LDLCALC 88 01/28/2021   TRIG 106.0 01/28/2021   CHOLHDL 3 01/28/2021       Latest Ref Rng & Units 01/28/2021   10:25 AM 01/25/2020    2:40 PM 11/03/2019   11:07 AM  Hepatic Function  Total Protein 6.0 - 8.3 g/dL 7.5   7.6   7.2    Albumin 3.5 - 5.2 g/dL 3.8   4.2     AST 0 - 37 U/L _0 ALT 0 - 35  U/L _0 Alk Phosphatase 39 - 117 U/L 81   89     Total Bilirubin 0.2 - 1.2 mg/dL 0.7   0.6   0.5      Lab Results  Component Value Date/Time   TSH 2.02 01/28/2021 10:25 AM   TSH 1.51 01/25/2020 02:40 PM       Latest Ref Rng & Units 01/28/2021   10:25 AM 01/25/2020    2:40 PM 11/03/2019   11:07 AM  CBC  WBC 4.0 - 10.5 K/uL 6.5   8.5   8.7    Hemoglobin 12.0 - 15.0 g/dL 12.4   12.7   12.8    Hematocrit 36.0 - 46.0 % 39.0   38.3   40.7    Platelets 150.0 - 400.0 K/uL 226.0   264.0   241      Lab Results  Component Value Date/Time   VD25OH 36 11/03/2019 11:07 AM   VD25OH 22.52 (L) 12/04/2015 10:41 AM   VD25OH 20.23 (L) 11/21/2014 11:04  AM    Clinical ASCVD: No  The ASCVD Risk score (Arnett DK, et al., 2019) failed to calculate for the following reasons:   The 2019 ASCVD risk score is only valid for ages 34 to 78       01/28/2021    9:53 AM 10/08/2020   11:43 AM 01/25/2020    1:51 PM  Depression screen PHQ 2/9  Decreased Interest 0 0 0  Down, Depressed, Hopeless 0 0 0  PHQ - 2 Score 0 0 0     Social History   Tobacco Use  Smoking Status Former   Years: 1.50   Types: Cigarettes   Quit date: 01/28/1985   Years since quitting: 36.3  Smokeless Tobacco Never   BP Readings from Last 3 Encounters:  04/29/21 140/72  01/28/21 (!) 200/100  12/11/20 (!) 160/100   Pulse Readings from Last 3 Encounters:  04/29/21 91  01/28/21 (!) 106  12/11/20 97   Wt Readings from Last 3 Encounters:  04/29/21 133 lb (60.3 kg)  01/28/21 135 lb (61.2 kg)  12/11/20 132 lb (59.9 kg)   BMI Readings from Last 3 Encounters:  04/29/21 25.13 kg/m  01/28/21 25.51 kg/m  12/11/20 24.94 kg/m    Assessment/Interventions: Review of patient past medical history, allergies, medications, health status, including review of consultants reports, laboratory and other test data, was performed as part of comprehensive evaluation and provision of chronic care management services.   SDOH:  (Social Determinants of Health) assessments and interventions performed: Yes   SDOH Screenings   Alcohol Screen: Not on file  Depression (PHQ2-9): Low Risk    PHQ-2 Score: 0  Financial Resource Strain: Low Risk    Difficulty of Paying Living Expenses: Not hard at all  Food Insecurity: No Food Insecurity   Worried About Charity fundraiser in the Last Year: Never true   Ran Out of Food in the Last Year: Never true  Housing: Low Risk    Last Housing Risk Score: 0  Physical Activity: Sufficiently Active   Days of Exercise per Week: 3 days   Minutes of Exercise per Session: 60 min  Social Connections: Moderately Integrated   Frequency of Communication  with Friends and Family: More than three times a week   Frequency of Social Gatherings with Friends and Family: More than three times a week   Attends Religious Services: 1 to 4 times per year  Active Member of Clubs or Organizations: Yes   Attends Archivist Meetings: 1 to 4 times per year   Marital Status: Divorced  Stress: No Stress Concern Present   Feeling of Stress : Not at all  Tobacco Use: Medium Risk   Smoking Tobacco Use: Former   Smokeless Tobacco Use: Never   Passive Exposure: Not on file  Transportation Needs: No Transportation Needs   Lack of Transportation (Medical): No   Lack of Transportation (Non-Medical): No    CCM Care Plan  Allergies  Allergen Reactions   Hydrochlorothiazide    Jardiance [Empagliflozin]     Yeast infections    Lisinopril Cough   Losartan Cough   Metformin And Related Diarrhea   Metoprolol Swelling    Leg cramping    Novolin R [Insulin] Itching   Sulfamethoxazole     REACTION: "comatose"   Toujeo Solostar [Insulin Glargine] Rash    Medications Reviewed Today     Reviewed by Dorothyann Peng, NP (Nurse Practitioner) on 04/29/21 at 1010  Med List Status: <None>   Medication Order Taking? Sig Documenting Provider Last Dose Status Informant  amLODipine (NORVASC) 10 MG tablet [id number] Yes Take 1 tablet (10 mg total) by mouth daily. Nafziger, Tommi Rumps, NP Taking Active   aspirin 81 MG tablet [id number] Yes Take 81 mg by mouth as needed. [provider] Taking Active   Discontinued 04/29/21 1005   glipiZIDE (GLUCOTROL XL) 5 MG 24 hr tablet [id number] Yes Take 1 tablet (5 mg total) by mouth daily with breakfast. Dorothyann Peng, NP Taking Active   Discontinued 04/29/21 0940 (Change in therapy)    Discontinued 04/29/21 1004   glucose blood test strip [id number] Yes Use as instructed Dorothyann Peng, NP  Active   Discontinued 04/29/21 1005   ibuprofen (ADVIL,MOTRIN) 200 MG tablet [id number] Yes Take 200 mg by mouth every 6 (six)  hours as needed. Reported on 03/22/2015 [provider] Taking Active Self  insulin detemir (LEVEMIR FLEXTOUCH) 100 UNIT/ML FlexPen [id number] Yes INJECT 35 UNITS SUBCUTANEOUSLY AT BEDTIME  Patient taking differently: 20 Units 2 (two) times daily.   Dorothyann Peng, NP Taking Active   Insulin Pen Needle (RELION PEN NEEDLES) 31G X 6 MM MISC [id number] Yes Use daily Nafziger, Tommi Rumps, NP Taking Active   Lancets (FREESTYLE) lancets [id number] Yes Use as instructed Dorothyann Peng, NP Taking Active   Multiple Vitamins-Minerals (MULTIPLE VITAMINS/WOMENS PO) [id number] Yes Take by mouth. [provider] Taking Active   olmesartan (BENICAR) 40 MG tablet [id number] Yes Take 1 tablet (40 mg total) by mouth daily. Nafziger, Tommi Rumps, NP Taking Active   Semaglutide, 1 MG/DOSE, (OZEMPIC, 1 MG/DOSE,) 4 MG/3ML SOPN [id number] Yes Inject 1 mg into the skin once a week. Dorothyann Peng, NP  Active   Discontinued 04/29/21 0951   spironolactone (ALDACTONE) 25 MG tablet [id number] Yes Take 1 tablet (25 mg total) by mouth daily. Dorothyann Peng, NP Taking Active             Patient Active Problem List   Diagnosis Date Noted   Uncontrolled type 2 diabetes mellitus with hyperglycemia, with long-term current use of insulin ([place]) 03/08/2015   Hyperlipidemia 11/23/2013   Diverticulosis 02/21/2013   Ischemic colitis ([place]) 01/20/2013   GI bleed 01/18/2013   Extrinsic asthma, unspecified 03/17/2012   Essential hypertension 08/09/2006   GERD 08/09/2006    Immunization History  Administered Date(s) Administered   Fluad Quad(high Dose 65+) 11/03/2019, 11/08/2020   Influenza Split 12/16/2010, 11/11/2011, 11/08/2012   Influenza Whole 01/26/2005,  12/06/2009   Influenza, High Dose Seasonal PF 10/03/2014, 12/04/2015, 11/19/2016, 10/25/2017   Influenza,inj,Quad PF,6+ Mos 11/23/2013   PFIZER(Purple Top)SARS-COV-2 Vaccination 02/17/2019, 03/10/2019   Pneumococcal Conjugate-13 10/06/2013, 06/10/2017, 06/11/2017    Pneumococcal Polysaccharide-23 01/27/2003, 12/04/2015   Td 01/27/2003, 10/06/2013   Tdap 06/10/2017   Zoster Recombinat (Shingrix) 06/10/2017, 03/25/2018   Patient reports she is having some low blood sugars overnight. She said it has been dropping to around 55 during the night a couple of nights. She reports she has had the Dexcom on for 10 days and has already had 2 lows at night that she was unaware of.  Conditions to be addressed/monitored:  Hypertension, Hyperlipidemia, Diabetes, and GERD  Conditions addressed this visit: Hypertension, diabetes  Care Plan : CCM Pharmacy Care Plan  Updates made by Viona Gilmore, [place] since 06/11/2021 12:00 AM     Problem: Problem: Hypertension, Hyperlipidemia, Diabetes, and GERD      Long-Range Goal: Patient-Specific Goal   Start Date: 03/11/2021  Expected End Date: 03/11/2022  Recent Progress: On track  Priority: High  Note:   Current Barriers:  Unable to independently monitor therapeutic efficacy Unable to achieve control of diabetes and hypertension  Unable to self administer medications as prescribed  Pharmacist Clinical Goal(s):  Patient will verbalize ability to afford treatment regimen achieve adherence to monitoring guidelines and medication adherence to achieve therapeutic efficacy achieve control of diabetes and hypertension as evidenced by A1c and blood pressure readings  through collaboration with PharmD and provider.   Interventions: 1:1 collaboration with Dorothyann Peng, NP regarding development and update of comprehensive plan of care as evidenced by provider attestation and co-signature Inter-disciplinary care team collaboration (see longitudinal plan of care) Comprehensive medication review performed; medication list updated in electronic medical record  Hypertension (BP goal <140/90) -Not ideally controlled -Current treatment: Amlodipine 10 mg 1 tablet daily - in AM - Appropriate, Query effective, Safe,  Accessible Olmesartan 40 mg 1 tablet daily - in PM - Appropriate, Query effective, Safe, Accessible Spironolactone 25 mg 1 tablet daily - in PM - Appropriate, Query effective, Safe, Accessible -Medications previously tried: HCTZ (unknown reaction)  -Current home readings: 136-151; Omron arm cuff   -Current dietary habits: using [place] salt; recommended lower sodium salt; no more canned foods, more frozen foods now; [place] lunch meat  -Current exercise habits: not doing anything super consistently -Reports hypotensive/hypertensive symptoms -Educated on BP goals and benefits of medications for prevention of heart attack, stroke and kidney damage; Daily salt intake goal < 2300 mg; Exercise goal of 150 minutes per week; Importance of home blood pressure monitoring; Proper BP monitoring technique; -Counseled to monitor BP at home every other day, document, and provide log at future appointments -Counseled on diet and exercise extensively Recommended to continue current medication  Hyperlipidemia: (LDL goal < 100) -Controlled -Current treatment: No medications -Medications previously tried: none  -Current dietary patterns: chicken and fish and not much red meat -Current exercise habits: nothing consistently -Educated on Cholesterol goals;  Benefits of statin for ASCVD risk reduction; Importance of limiting foods high in cholesterol; -Counseled on diet and exercise extensively Consider moderate intensity statin.  Diabetes (A1c goal <7%) -Uncontrolled -Current medications: Levemir 100 units/ml inject 20 units twice daily - Appropriate, Query effective, Safe, Accessible Glipizide XL 5 mg  1 tablet daily with breakfast - Appropriate, Query effective, Safe, Accessible Ozempic 1 mg inject once weekly - Appropriate, Query effective, Safe, Accessible -Medications previously tried: Novolin, Januvia (chest pain), Jardiance (yeast infections), Toujeo, Tresiba, metformin (  diarrhea) -Current  home glucose readings fasting glucose: 122 this morning, 128, 123, 114, 107, 124 post prandial glucose: n/a -Denies hypoglycemic/hyperglycemic symptoms -Current meal patterns: 2 meals a day breakfast: spinach, onions and eggs, sausage  lunch: not often  dinner: fish, chicken, kale; prime rib once in a blue moon; pasta salad snacks: peanuts, almonds, popcorn, apples, oranges, lemons; not many sweets, plain yogurt drinks: green tea, water, coffee (once a week), hibiscus tea -Current exercise: was doing an exercise class at the gym; postponed because of the holidays and COVID -Educated on A1c and blood sugar goals; Exercise goal of 150 minutes per week; Benefits of routine self-monitoring of blood sugar; Continuous glucose monitoring; Carbohydrate counting and/or plate method -Counseled to check feet daily and get yearly eye exams -Recommended decreasing Levemir in the evening to 16 units.  Osteopenia (Goal prevent fractures) -Not ideally controlled -Last DEXA Scan: 2016   T-Score femoral neck: -1.5  T-Score total hip: n/a  T-Score lumbar spine: -1.3  T-Score forearm radius: n/a  10-year probability of major osteoporotic fracture: 4.9%  10-year probability of hip fracture: 0.9% -Patient is not a candidate for pharmacologic treatment -Current treatment  Multivitamin 1 tablet daily - Appropriate, Effective, Safe, Accessible -Medications previously tried: none  -Recommend [phone] units of vitamin D daily. Recommend 1200 mg of calcium daily from dietary and supplemental sources. -Recommended scheduling repeat bone density scan.  Health Maintenance -Vaccine gaps: none -Current therapy:  Ibuprofen 200 mg as needed Aspirin 81 mg 1 tablet as needed -Educated on Cost vs benefit of each product must be carefully weighed by individual consumer -Patient is satisfied with current therapy and denies issues -Counseled on risk vs benefits of aspirin therapy for primary prevention.  Patient  Goals/Self-Care Activities Patient will:  - take medications as prescribed as evidenced by patient report and record review check glucose daily, document, and provide at future appointments check blood pressure a few times a week, document, and provide at future appointments target a minimum of 150 minutes of moderate intensity exercise weekly  Follow Up Plan: The care management team will reach out to the patient again over the next 14 days.       Medication Assistance: None required.  Patient affirms current coverage meets needs.  Compliance/Adherence/Medication fill history: Care Gaps: Hep C screening, COVID booster, urine microalbumin Last BP - 140/72 on 04/29/2021 Last A1C - 8.6 on 04/29/2021  Star-Rating Drugs: Glipzide 5 mg - last filled 05/15/2021 30 DS at [facility] Levemir Flextouch 100 un/ml - last filled 03/19/2021 90 DS at [facility] Olmesartan 40 mg - last filled 05/15/2021 30 DS at [facility] Ozempic 4m dose - last filled 05/15/2021 28 DS at [facility]  Patient's preferred pharmacy is:  [facility] [place] - [address] [place] [id number]: [phone]: [phone] [facility] [place] [place]Phone: [phone]: [phone] Uses pill box? No - bottles lined up in kitchen; pill box Pt endorses 90% compliance  We discussed: Benefits of medication synchronization, packaging and delivery as well as enhanced pharmacist oversight with [facility]. Patient decided to: Utilize [facility] pharmacy for medication synchronization, packaging and delivery  Care Plan and Follow Up Patient Decision:  Patient agrees to Care Plan and Follow-up.  Plan: The care management team will reach out to the patient again over the next 30 days.  MJeni Salles PharmD,  Administrator, arts at  [facility]

## 2021-06-11 NOTE — Patient Instructions (Signed)
Hi [place], ? ?It was great to catch up again! ? ?Please reach out to me if you have any questions or need anything before our follow up! ? ?Best, ?Maddie ? ?Gaylord Shih, PharmD, BCACP ?Clinical Pharmacist ?Nature conservation officer at [place] ?[phone] ? ? Visit Information ? ? Goals Addressed   ?None ?  ? ?Patient Care Plan: CCM Pharmacy Care Plan  ?  ? ?Problem Identified: Problem: Hypertension, Hyperlipidemia, Diabetes, and GERD   ?  ? ?Long-Range Goal: Patient-Specific Goal   ?Start Date: 03/11/2021  ?Expected End Date: 03/11/2022  ?Recent Progress: On track  ?Priority: High  ?Note:   ?Current Barriers:  ?Unable to independently monitor therapeutic efficacy ?Unable to achieve control of diabetes and hypertension  ?Unable to self administer medications as prescribed ? ?Pharmacist Clinical Goal(s):  ?Patient will verbalize ability to afford treatment regimen ?achieve adherence to monitoring guidelines and medication adherence to achieve therapeutic efficacy ?achieve control of diabetes and hypertension as evidenced by A1c and blood pressure readings  through collaboration with PharmD and provider.  ? ?Interventions: ?1:1 collaboration with [name], NP regarding development and update of comprehensive plan of care as evidenced by provider attestation and co-signature ?Inter-disciplinary care team collaboration (see longitudinal plan of care) ?Comprehensive medication review performed; medication list updated in electronic medical record ? ?Hypertension (BP goal <140/90) ?-Not ideally controlled ?-Current treatment: ?Amlodipine 10 mg 1 tablet daily - in AM - Appropriate, Query effective, Safe, Accessible ?Olmesartan 40 mg 1 tablet daily - in PM - Appropriate, Query effective, Safe, Accessible ?Spironolactone 25 mg 1 tablet daily - in PM - Appropriate, Query effective, Safe, Accessible ?-Medications previously tried: HCTZ (unknown reaction)  ?-Current home readings: 136-151; Omron arm cuff   ?-Current  dietary habits: using [place] salt; recommended lower sodium salt; no more canned foods, more frozen foods now; [place] lunch meat  ?-Current exercise habits: not doing anything super consistently ?-Reports hypotensive/hypertensive symptoms ?-Educated on BP goals and benefits of medications for prevention of heart attack, stroke and kidney damage; ?Daily salt intake goal < 2300 mg; ?Exercise goal of 150 minutes per week; ?Importance of home blood pressure monitoring; ?Proper BP monitoring technique; ?-Counseled to monitor BP at home every other day, document, and provide log at future appointments ?-Counseled on diet and exercise extensively ?Recommended to continue current medication ? ?Hyperlipidemia: (LDL goal < 100) ?-Controlled ?-Current treatment: ?No medications ?-Medications previously tried: none  ?-Current dietary patterns: chicken and fish and not much red meat ?-Current exercise habits: nothing consistently ?-Educated on Cholesterol goals;  ?Benefits of statin for ASCVD risk reduction; ?Importance of limiting foods high in cholesterol; ?-Counseled on diet and exercise extensively ?Consider moderate intensity statin. ? ?Diabetes (A1c goal <7%) ?-Uncontrolled ?-Current medications: ?Levemir 100 units/ml inject 20 units twice daily - Appropriate, Query effective, Safe, Accessible ?Glipizide XL 5 mg  1 tablet daily with breakfast - Appropriate, Query effective, Safe, Accessible ?Ozempic 1 mg inject once weekly - Appropriate, Query effective, Safe, Accessible ?-Medications previously tried: Novolin, Januvia (chest pain), Jardiance (yeast infections), Toujeo, Tresiba, metformin (diarrhea) ?-Current home glucose readings ?fasting glucose: 122 this morning, 128, 123, 114, 107, 124 ?post prandial glucose: n/a ?-Denies hypoglycemic/hyperglycemic symptoms ?-Current meal patterns: 2 meals a day ?breakfast: spinach, onions and eggs, sausage  ?lunch: not often  ?dinner: fish, chicken, kale; prime rib once in a blue  moon; pasta salad ?snacks: peanuts, almonds, popcorn, apples, oranges, lemons; not many sweets, plain yogurt ?drinks: green tea, water, coffee (once a week), hibiscus tea ?-Current exercise: was doing  an exercise class at the gym; postponed because of the holidays and COVID ?-Educated on A1c and blood sugar goals; ?Exercise goal of 150 minutes per week; ?Benefits of routine self-monitoring of blood sugar; ?Continuous glucose monitoring; ?Carbohydrate counting and/or plate method ?-Counseled to check feet daily and get yearly eye exams ?-Recommended decreasing Levemir in the evening to 16 units. ? ?Osteopenia (Goal prevent fractures) ?-Not ideally controlled ?-Last DEXA Scan: 2016  ? T-Score femoral neck: -1.5 ? T-Score total hip: n/a ? T-Score lumbar spine: -1.3 ? T-Score forearm radius: n/a ? 10-year probability of major osteoporotic fracture: 4.9% ? 10-year probability of hip fracture: 0.9% ?-Patient is not a candidate for pharmacologic treatment ?-Current treatment  ?Multivitamin 1 tablet daily - Appropriate, Effective, Safe, Accessible ?-Medications previously tried: none  ?-Recommend [phone] units of vitamin D daily. Recommend 1200 mg of calcium daily from dietary and supplemental sources. ?-Recommended scheduling repeat bone density scan. ? ?Health Maintenance ?-Vaccine gaps: none ?-Current therapy:  ?Ibuprofen 200 mg as needed ?Aspirin 81 mg 1 tablet as needed ?-Educated on Cost vs benefit of each product must be carefully weighed by individual consumer ?-Patient is satisfied with current therapy and denies issues ?-Counseled on risk vs benefits of aspirin therapy for primary prevention. ? ?Patient Goals/Self-Care Activities ?Patient will:  ?- take medications as prescribed as evidenced by patient report and record review ?check glucose daily, document, and provide at future appointments ?check blood pressure a few times a week, document, and provide at future appointments ?target a minimum of 150 minutes of  moderate intensity exercise weekly ? ?Follow Up Plan: The care management team will reach out to the patient again over the next 14 days.  ? ?  ?  ? ?Patient verbalizes understanding of instructions and care plan provided today and agrees to view in MyChart. Active MyChart status and patient understanding of how to access instructions and care plan via MyChart confirmed with patient.    ?The pharmacy team will reach out to the patient again over the next 7 days.  ? ?Verner Chol, RPH  ?

## 2021-06-13 ENCOUNTER — Telehealth: Payer: Self-pay | Admitting: Pharmacist

## 2021-06-13 NOTE — Telephone Encounter (Signed)
Called patient back after discussion with PCP. Instructed patient to decrease Levemir to 16 units in the evening and remain on 20 units in the morning. Plan to follow up in 1 month to see how patient is doing.

## 2021-06-25 DIAGNOSIS — E1165 Type 2 diabetes mellitus with hyperglycemia: Secondary | ICD-10-CM

## 2021-06-25 DIAGNOSIS — Z87891 Personal history of nicotine dependence: Secondary | ICD-10-CM

## 2021-06-25 DIAGNOSIS — Z794 Long term (current) use of insulin: Secondary | ICD-10-CM | POA: Diagnosis not present

## 2021-06-25 DIAGNOSIS — I10 Essential (primary) hypertension: Secondary | ICD-10-CM

## 2021-06-25 DIAGNOSIS — M8580 Other specified disorders of bone density and structure, unspecified site: Secondary | ICD-10-CM

## 2021-06-25 DIAGNOSIS — E785 Hyperlipidemia, unspecified: Secondary | ICD-10-CM | POA: Diagnosis not present

## 2021-07-08 ENCOUNTER — Telehealth: Payer: Self-pay | Admitting: Pharmacist

## 2021-07-08 NOTE — Chronic Care Management (AMB) (Signed)
Chronic Care Management Pharmacy Assistant   Name: Katrina Garrison  MRN: [id number] DOB: [birth year]  Reason for Encounter: Medication Review / Medication Coordination Call   Conditions to be addressed/monitored: HTN  Recent office visits:  None  Recent consult visits:  None  Hospital visits:  None  Medications: Outpatient Encounter Medications as of 07/08/2021  Medication Sig   amLODipine (NORVASC) 10 MG tablet Take 1 tablet (10 mg total) by mouth daily.   aspirin 81 MG tablet Take 81 mg by mouth as needed.   fluconazole (DIFLUCAN) 150 MG tablet Take 1 tablet (150 mg total) by mouth daily.   glipiZIDE (GLUCOTROL XL) 5 MG 24 hr tablet TAKE ONE TABLET BY MOUTH EVERY EVENING   glucose blood test strip Use as instructed   ibuprofen (ADVIL,MOTRIN) 200 MG tablet Take 200 mg by mouth every 6 (six) hours as needed. Reported on 03/22/2015   insulin detemir (LEVEMIR FLEXTOUCH) 100 UNIT/ML FlexPen INJECT 35 UNITS SUBCUTANEOUSLY AT BEDTIME (Patient taking differently: 20 Units 2 (two) times daily.)   Insulin Pen Needle (RELION PEN NEEDLES) 31G X 6 MM MISC Use daily   Multiple Vitamins-Minerals (MULTIPLE VITAMINS/WOMENS PO) Take by mouth.   olmesartan (BENICAR) 40 MG tablet Take 1 tablet (40 mg total) by mouth daily.   Semaglutide, 1 MG/DOSE, (OZEMPIC, 1 MG/DOSE,) 4 MG/3ML SOPN Inject 1 mg into the skin once a week.   spironolactone (ALDACTONE) 25 MG tablet Take 1 tablet (25 mg total) by mouth daily.   No facility-administered encounter medications on file as of 07/08/2021.  Reviewed chart for medication changes ahead of medication coordination call.  No OVs, Consults, or hospital visits since last care coordination call/Pharmacist visit. (If appropriate, list visit date, provider name)  No medication changes indicated OR if recent visit, treatment plan here.  BP Readings from Last 3 Encounters:  04/29/21 140/72  01/28/21 (!) 200/100  12/11/20 (!) 160/100    Lab Results   Component Value Date   HGBA1C 8.6 (A) 04/29/2021     Recent Office Vitals: BP Readings from Last 3 Encounters:  04/29/21 140/72  01/28/21 (!) 200/100  12/11/20 (!) 160/100   Pulse Readings from Last 3 Encounters:  04/29/21 91  01/28/21 (!) 106  12/11/20 97    Wt Readings from Last 3 Encounters:  04/29/21 133 lb (60.3 kg)  01/28/21 135 lb (61.2 kg)  12/11/20 132 lb (59.9 kg)     Kidney Function Lab Results  Component Value Date/Time   CREATININE 0.97 01/28/2021 10:25 AM   CREATININE 1.02 04/24/2020 10:17 AM   CREATININE 1.05 (H) 11/03/2019 11:07 AM   GFR 55.43 (L) 01/28/2021 10:25 AM   GFRNONAA 51 (L) 11/03/2019 11:07 AM   GFRAA 59 (L) 11/03/2019 11:07 AM       Latest Ref Rng & Units 01/28/2021   10:25 AM 04/24/2020   10:17 AM 01/25/2020    2:40 PM  BMP  Glucose 70 - 99 mg/dL 921  194  174   BUN 6 - 23 mg/dL 19  22  23    Creatinine 0.40 - 1.20 mg/dL  0.81  4.48   Sodium 135 - 145 mEq/L 138  137  136   Potassium 3.5 - 5.1 mEq/L 3.9  4.3  4.0   Chloride 96 - 112 mEq/L 103  101  101   CO2 19 - 32 mEq/L 26  25  24    Calcium 8.4 - 10.5 mg/dL 9.4  1.85     Recent Relevant  Labs: Lab Results  Component Value Date/Time   HGBA1C 8.6 (A) 04/29/2021 09:42 AM   HGBA1C 10.5 (H) 01/28/2021 10:25 AM   HGBA1C 8.0 (A) 11/08/2020 01:37 PM   HGBA1C 8.9 (A) 08/09/2020 01:34 PM   HGBA1C 10.0 (H) 04/24/2020 10:17 AM   MICROALBUR 103.8 (H) 01/25/2020 02:40 PM   MICROALBUR 1.98 (H) 10/14/2012 08:47 AM    Kidney Function Lab Results  Component Value Date/Time   CREATININE 0.97 01/28/2021 10:25 AM   CREATININE 1.02 04/24/2020 10:17 AM   CREATININE 1.05 (H) 11/03/2019 11:07 AM   GFR 55.43 (L) 01/28/2021 10:25 AM   GFRNONAA 51 (L) 11/03/2019 11:07 AM   GFRAA 59 (L) 11/03/2019 11:07 AM   Patient obtains medications through Adherence Packaging  30 Days    Last adherence delivery included:  Glipizide ER 5 mg - take 1 tablet with dinner Olmesartan 40 mg  - take 1 tablet  with dinner Spironolactone 25 mg - take 1 tablet at breakfast Amlodipine 10 mg  - take 1 tablet at breakfast Ozempic 4mg /62ml - inject 1 mg weekly    Patient declined the following medications:   Levemir Flextouch, plenty on hand Relion pen needles, plenty on hand Test strips, plenty on hand   Patient is due for next adherence delivery on: 07/21/2021   Called patient and reviewed medications and coordinated delivery.   This delivery to include: Glipizide ER 5 mg - take 1 tablet with dinner Olmesartan 40 mg  - take 1 tablet with dinner Spironolactone 25 mg - take 1 tablet at breakfast Amlodipine 10 mg  - take 1 tablet at breakfast Ozempic 4mg /1ml - inject 1 mg weekly - loss of taste Pen needles - use as directed  Patient will need a short fill: No short fills needed    Coordinated acute fill: No acute fills needed    Patient declined the following medications: She has plenty on hand Levemir Flextouch 100 un/ml - 20 units twice daily Vitafusion - chew 1 gummy every morning  Patient no longer taking/using:  Accuchek guide test strips - Now has dexcom Relion pen needles - No longer using per patient  Confirmed delivery date of 07/21/2021, advised patient that pharmacy will contact them the morning of delivery.   Current antihypertensive regimen:  Amlodipine 10 mg daily Olmesartan 40 mg daily Spironolactone 25 mg daily  How often are you checking your Blood Pressure? Patient is checking daily  Current home BP readings: Patient states her readings are between 116/82 and 136/94  What recent interventions/DTPs have been made by any provider to improve Blood Pressure and Diabetes control since last CPP Visit                     Recommended monitoring blood sugars and blood pressure in the evening to determine trends.  Patient states she is checking in the evening.           Recommend decreasing evening dose of Levemir to 16 units to avoid hypoglycemic unawareness. Patient states  she has decreased Levemir to 16 units and the dexcom has been a wonderful help with this.   Any recent hospitalizations or ED visits since last visit with CPP? No recent hospital visit.   What diet changes have been made to improve Blood Pressure Control?  Patient follows no specific diet, low sugars and carbs Breakfast - patient will have bacon, egg and grits or applesause Lunch - she doesn't eat lunch  Dinner - patient will have, chicken  or fish, vegetables with rice or potato. Snacks - vegetables, crackers with peanut butter, nuts or chicken salad  What exercise is being done to improve your Blood Pressure Control?  Patient stays very active, walking at the grocery store for 2 hours, weights and climbing stairs.  Current antihyperglycemic regimen:  Glipizide 5 mg daily Levemir Flextouch 100 units/ml 16units twice daily Ozempic 4mg /123ml - inject 1 mg weekly  Patient denies hypoglycemic symptoms since decreasing Levemir to 16 units daily.   Patient denies hyperglycemic symptoms, including none  How often are you checking your blood sugar? Patient has a dexcom and is checking her blood sugars often through out the day  What are your blood sugars ranging?  Patient couldn't figure out how to get her blood sugar range and wasn't sure how to see which ones were fasting or non fasting. She checked her BS while on the phone, it was 188 NF and was able to see the average BS for the past 14 days, it was 135.   During the week, how often does your blood glucose drop below 70? Patient denies having any readings below 70 since decreasing Levemir  Are you checking your feet daily/regularly? Patient states she is checking her feet daily and recently went to the podiatrist.   Adherence Review: Is the patient currently on a STATIN medication? No Is the patient currently on ACE/ARB medication? Yes Does the patient have >5 day gap between last estimated fill dates? No  Care Gaps: AWV - scheduled  10/21/2021 Last BP - 140/72 on 04/29/2021 Last A1C - 8.6 on 04/29/2021 Covid booster - overdue Malb - overview  Star Rating Drugs: Glipizide 5 mg - last filled 06/12/2021 30 [facility] at [facility] Olmesartan 40 mg - last filled 06/12/2021 30 [facility] at [facility] Ozempic 4mg /513ml - last filled 06/12/2021 28 [facility] at [facility]  Inetta FermoJanie Johnson [facility] HospitalCMA  Clinical Pharmacist Assistant [phone]

## 2021-07-14 DIAGNOSIS — R6889 Other general symptoms and signs: Secondary | ICD-10-CM | POA: Diagnosis not present

## 2021-07-16 NOTE — Telephone Encounter (Signed)
Called patient to follow up on the possible Ozempic side effects. Per discussion with PCP will hold Ozempic x 1 week to see if side effects/symptoms resolve. Plan to follow up in 1 week to see if anything has changed and could consider lowering the dose to 0.5 mg if symptoms have improved.

## 2021-07-25 NOTE — Telephone Encounter (Signed)
Called patient to follow up on Ozempic side effects. Patient reports all of her symptoms went away after holding the dose for 1 week. Patient does not wish to restart the medication even at a lower dose despite not having symptoms on the 0.5 mg dose. Moved up PCP visit set for August to July and will check back in pending A1c results.

## 2021-08-07 ENCOUNTER — Telehealth: Payer: Self-pay | Admitting: Pharmacist

## 2021-08-07 NOTE — Chronic Care Management (AMB) (Signed)
Chronic Care Management Pharmacy Assistant   Name: Katrina Garrison  MRN: [id number] DOB: 16-Dec-1940  Reason for Encounter: Medication Review / Medication Coordination Call  Recent office visits:  None  Recent consult visits:  None  Hospital visits:  None  Medications: Outpatient Encounter Medications as of 08/07/2021  Medication Sig   amLODipine (NORVASC) 10 MG tablet Take 1 tablet (10 mg total) by mouth daily.   aspirin 81 MG tablet Take 81 mg by mouth as needed.   fluconazole (DIFLUCAN) 150 MG tablet Take 1 tablet (150 mg total) by mouth daily.   glipiZIDE (GLUCOTROL XL) 5 MG 24 hr tablet TAKE ONE TABLET BY MOUTH EVERY EVENING   glucose blood test strip Use as instructed   ibuprofen (ADVIL,MOTRIN) 200 MG tablet Take 200 mg by mouth every 6 (six) hours as needed. Reported on 03/22/2015   insulin detemir (LEVEMIR FLEXTOUCH) 100 UNIT/ML FlexPen INJECT 35 UNITS SUBCUTANEOUSLY AT BEDTIME (Patient taking differently: 20 Units 2 (two) times daily.)   Insulin Pen Needle (RELION PEN NEEDLES) 31G X 6 MM MISC Use daily   Multiple Vitamins-Minerals (MULTIPLE VITAMINS/WOMENS PO) Take by mouth.   olmesartan (BENICAR) 40 MG tablet Take 1 tablet (40 mg total) by mouth daily.   spironolactone (ALDACTONE) 25 MG tablet Take 1 tablet (25 mg total) by mouth daily.   No facility-administered encounter medications on file as of 08/07/2021.  Reviewed chart for medication changes ahead of medication coordination call.  No OVs, Consults, or hospital visits since last care coordination call/Pharmacist visit. (If appropriate, list visit date, provider name)  No medication changes indicated OR if recent visit, treatment plan here.  BP Readings from Last 3 Encounters:  04/29/21 140/72  01/28/21 (!) 200/100  12/11/20 (!) 160/100    Lab Results  Component Value Date   HGBA1C 8.6 (A) 04/29/2021       Patient obtains medications through Adherence Packaging  30 Days    Last adherence delivery  included:  Glipizide ER 5 mg - take 1 tablet with dinner Olmesartan 40 mg  - take 1 tablet with dinner Spironolactone 25 mg - take 1 tablet at breakfast Amlodipine 10 mg  - take 1 tablet at breakfast Pen needles - use as directed    Patient declined the following medications:   Levemir Flextouch 100 un/ml - 20 units twice daily Vitafusion - chew 1 gummy every morning Ozemic   Patient is due for next adherence delivery on: 08/19/2021   Called patient and reviewed medications and coordinated delivery.   This delivery to include: Glipizide ER 5 mg - take 1 tablet with dinner Spironolactone 25 mg - take 1 tablet at breakfast Olmesartan 40 mg  - take 1 tablet with dinner Amlodipine 10 mg  - take 1 tablet at breakfast Vitafusion - chew 1 gummy every morning   Patient will need a short fill: No short fills needed    Coordinated acute fill: No acute fills needed    Patient declined the following medications: She has plenty on hand Levemir Flextouch 100 un/ml - 20 units twice daily Pen needles - use as directed   Patient no longer taking/using:  Accuchek guide test strips - Now has dexcom Ozempic 4mg /67ml - inject 1 mg weekly - loss of taste   Confirmed delivery date of 08/19/2021, advised patient that pharmacy will contact them the morning of delivery.  Care Gaps: AWV - scheduled 10/21/2021 Last BP - 140/72 on 04/29/2021 Last A1C - 8.6 on 04/29/2021 Covid booster -  overdue Malb - overdue  Star Rating Drugs: Glipizide 5 mg - last filled 07/15/2021 30 [facility] Olmesartan 40 mg - last filled 07/15/2021 30 DS at [facility]  Inetta Fermo [facility]  Clinical Pharmacist Assistant [phone]

## 2021-08-11 DIAGNOSIS — E1165 Type 2 diabetes mellitus with hyperglycemia: Secondary | ICD-10-CM | POA: Diagnosis not present

## 2021-08-13 ENCOUNTER — Ambulatory Visit: Payer: Medicare HMO | Admitting: Adult Health

## 2021-09-02 ENCOUNTER — Ambulatory Visit (INDEPENDENT_AMBULATORY_CARE_PROVIDER_SITE_OTHER): Payer: Medicare HMO | Admitting: Adult Health

## 2021-09-02 ENCOUNTER — Ambulatory Visit: Payer: Medicare HMO | Admitting: Adult Health

## 2021-09-02 ENCOUNTER — Encounter: Payer: Self-pay | Admitting: Adult Health

## 2021-09-02 VITALS — BP 160/80 | HR 72 | Temp 98.7°F | Ht 61.0 in | Wt 122.0 lb

## 2021-09-02 DIAGNOSIS — Z794 Long term (current) use of insulin: Secondary | ICD-10-CM | POA: Diagnosis not present

## 2021-09-02 DIAGNOSIS — I10 Essential (primary) hypertension: Secondary | ICD-10-CM | POA: Diagnosis not present

## 2021-09-02 DIAGNOSIS — E1165 Type 2 diabetes mellitus with hyperglycemia: Secondary | ICD-10-CM

## 2021-09-02 DIAGNOSIS — R6889 Other general symptoms and signs: Secondary | ICD-10-CM | POA: Diagnosis not present

## 2021-09-02 LAB — POCT GLYCOSYLATED HEMOGLOBIN (HGB A1C): Hemoglobin A1C: 6.9 % — AB (ref 4.0–5.6)

## 2021-09-02 MED ORDER — SEMAGLUTIDE(0.25 OR 0.5MG/DOS) 2 MG/3ML ~~LOC~~ SOPN
0.2500 mg | PEN_INJECTOR | SUBCUTANEOUS | 2 refills | Status: AC
Start: 2021-09-02 — End: 2021-12-01

## 2021-09-02 MED ORDER — GLIPIZIDE ER 5 MG PO TB24: 5.0000 mg | ORAL_TABLET | Freq: Every evening | ORAL | 0 refills | Status: DC

## 2021-09-02 NOTE — Progress Notes (Signed)
Subjective:    Patient ID: Katrina Garrison, female    DOB: Oct 11, 1940, 81 y.o.   MRN: [id number]  HPI 81 year old female who  has a past medical history of Anemia, Diabetes mellitus type II (2005), Diverticulosis, GERD (gastroesophageal reflux disease), History of lithotripsy, Hypertension, and Kidney stones.  She presents to the office today for follow up regarding DM or HTN    DM Type 2 -she is currently managed with Levemir 20 units twice daily, glipizide 5 mg extended release in the evening and until about 2 months ago she was on Wegovy 0.5 mg once weekly.  She stopped the [facility] due to constipation.  She is using continuous glucose monitor to check her blood sugars, her 90-day average is roughly 156 and she has been in target 75% of the time.  Per the monitor her since she stopped [facility] her blood sugars have been slowly increasing upwards of 170s to 180s.   Lab Results  Component Value Date   HGBA1C 6.9 (A) 09/02/2021    HTN -is with Norvasc 10 mg daily, Benicar 40 mg daily, and spironolactone 25 mg daily.  She has not taken her blood pressure medication this morning.  She denies dizziness, lightheadedness, chest pain, shortness of breath BP Readings from Last 3 Encounters:  09/02/21 (!) 160/80  04/29/21 140/72  01/28/21 (!) 200/100    Review of Systems See HPI   Past Medical History:  Diagnosis Date   Anemia    Diabetes mellitus type II 2005   Diverticulosis    GERD (gastroesophageal reflux disease)    History of lithotripsy    x3 for kidney stones   Hypertension    Kidney stones     Social History   Socioeconomic History   Marital status: Divorced    Spouse name: Not on file   Number of children: Not on file   Years of education: Not on file   Highest education level: Not on file  Occupational History   Occupation: Retired  Tobacco Use   Smoking status: Former    Years: 1.50    Types: Cigarettes    Quit date: 01/28/1985    Years since quitting: 36.6    Smokeless tobacco: Never  Substance and Sexual Activity   Alcohol use: No   Drug use: No   Sexual activity: Not on file  Other Topics Concern   Not on file  Social History Narrative   Not on file   Social Determinants of Health   Financial Resource Strain: Low Risk  (03/12/2021)   Overall Financial Resource Strain (CARDIA)    Difficulty of Paying Living Expenses: Not hard at all  Food Insecurity: No Food Insecurity (10/08/2020)   Hunger Vital Sign    Worried About Running Out of Food in the Last Year: Never true    Ran Out of Food in the Last Year: Never true  Transportation Needs: No Transportation Needs (03/12/2021)   PRAPARE - Administrator, Civil Service (Medical): No    Lack of Transportation (Non-Medical): No  Physical Activity: Sufficiently Active (10/08/2020)   Exercise Vital Sign    Days of Exercise per Week: 3 days    Minutes of Exercise per Session: 60 min  Stress: No Stress Concern Present (10/08/2020)   [employer] of Occupational Health - Occupational Stress Questionnaire    Feeling of Stress : Not at all  Social Connections: Moderately Integrated (10/08/2020)   Social Connection and Isolation Panel [NHANES]  Frequency of Communication with Friends and Family: More than three times a week    Frequency of Social Gatherings with Friends and Family: More than three times a week    Attends Religious Services: 1 to 4 times per year    Active Member of [employer] or Organizations: Yes    Attends Banker Meetings: 1 to 4 times per year    Marital Status: Divorced  Catering manager Violence: Not At Risk (10/08/2020)   Humiliation, Afraid, Rape, and Kick questionnaire    Fear of Current or Ex-Partner: No    Emotionally Abused: No    Physically Abused: No    Sexually Abused: No    Past Surgical History:  Procedure Laterality Date   ABDOMINAL HYSTERECTOMY     childbirth x 2     EYE SURGERY     cataract    Family History  Problem  Relation Age of Onset   Thyroid cancer Mother    Diabetes Mother    Hypertension Mother    Alcohol abuse Father    Hypertension Father    Multiple sclerosis Daughter    Multiple sclerosis Daughter    Multiple sclerosis Sister     Allergies  Allergen Reactions   Hydrochlorothiazide    Jardiance [Empagliflozin]     Yeast infections    Lisinopril Cough   Losartan Cough   Metformin And Related Diarrhea   Metoprolol Swelling    Leg cramping    Novolin R [Insulin] Itching   Sulfamethoxazole     REACTION: "comatose"   Toujeo Solostar [Insulin Glargine] Rash    Current Outpatient Medications on File Prior to Visit  Medication Sig Dispense Refill   amLODipine (NORVASC) 10 MG tablet Take 1 tablet (10 mg total) by mouth daily. 90 tablet 3   aspirin 81 MG tablet Take 81 mg by mouth as needed.     fluconazole (DIFLUCAN) 150 MG tablet Take 1 tablet (150 mg total) by mouth daily. 1 tablet 1   glucose blood test strip Use as instructed 300 each 12   ibuprofen (ADVIL,MOTRIN) 200 MG tablet Take 200 mg by mouth every 6 (six) hours as needed. Reported on 03/22/2015     insulin detemir (LEVEMIR FLEXTOUCH) 100 UNIT/ML FlexPen INJECT 35 UNITS SUBCUTANEOUSLY AT BEDTIME (Patient taking differently: 20 Units 2 (two) times daily.) 30 mL 0   Insulin Pen Needle (RELION PEN NEEDLES) 31G X 6 MM MISC Use daily 90 each 3   Multiple Vitamins-Minerals (MULTIPLE VITAMINS/WOMENS PO) Take by mouth.     spironolactone (ALDACTONE) 25 MG tablet Take 1 tablet (25 mg total) by mouth daily. 90 tablet 3   olmesartan (BENICAR) 40 MG tablet Take 1 tablet (40 mg total) by mouth daily. 90 tablet 3   No current facility-administered medications on file prior to visit.    BP (!) 160/80   Pulse 72   Temp 98.7 F (37.1 C) (Oral)   Ht 5\' 1"  (1.549 m)   Wt 122 lb (55.3 kg)   SpO2 97%   BMI 23.05 kg/m       Objective:   Physical Exam Vitals and nursing note reviewed.  Constitutional:      Appearance: Normal  appearance.  Cardiovascular:     Rate and Rhythm: Normal rate and regular rhythm.     Pulses: Normal pulses.     Heart sounds: Normal heart sounds.  Pulmonary:     Effort: Pulmonary effort is normal.     Breath sounds: Normal breath sounds.  Musculoskeletal:        General: Normal range of motion.  Skin:    General: Skin is warm and dry.     Capillary Refill: Capillary refill takes less than 2 seconds.  Neurological:     General: No focal deficit present.     Mental Status: She is alert and oriented to person, place, and time.  Psychiatric:        Mood and Affect: Mood normal.        Behavior: Behavior normal.        Thought Content: Thought content normal.        Judgment: Judgment normal.       Assessment & Plan:  1. Uncontrolled type 2 diabetes mellitus with hyperglycemia, with long-term current use of insulin (HCC)  - POC HgB A1c- 6.9 - at goal for the first time in years - Will place her back on Ozempic at low dose - Follow up  in 3 months or sooner if needed - Semaglutide,0.25 or 0.5MG /DOS, 2 MG/3ML SOPN; Inject 0.25 mg into the skin once a week.  Dispense: 3 mL; Refill: 2 - glipiZIDE (GLUCOTROL XL) 5 MG 24 hr tablet; Take 1 tablet (5 mg total) by mouth every evening.  Dispense: 90 tablet; Refill: 0  2. Essential hypertension - Better controlled when she takes her medications - No change in medication therapy today   [name], NP

## 2021-09-02 NOTE — Patient Instructions (Signed)
Your A1c was 6.9! This is great!   I am going to put you back on a low dose of Ozempic weekly. Please let me know if you have any issues. You can use miralax if needed for constipation   I will see you back in three month

## 2021-09-05 ENCOUNTER — Telehealth: Payer: Self-pay | Admitting: Pharmacist

## 2021-09-05 NOTE — Chronic Care Management (AMB) (Signed)
Chronic Care Management Pharmacy Assistant   Name: Katrina Garrison  MRN: [id number] DOB: 09-24-1940  Reason for Encounter: Medication Review / Medication Coordination Call  Recent office visits:  09/02/2021 Katrina Frees NP - Patient was seen for Uncontrolled type 2 diabetes mellitus with hyperglycemia, with long-term current use of insulin and a additional issue. Started Semaglutide 0.25 mg weekly. Follow up in 3 months.   Recent consult visits:  None  Hospital visits:  None  Medications: Outpatient Encounter Medications as of 09/05/2021  Medication Sig   amLODipine (NORVASC) 10 MG tablet Take 1 tablet (10 mg total) by mouth daily.   aspirin 81 MG tablet Take 81 mg by mouth as needed.   fluconazole (DIFLUCAN) 150 MG tablet Take 1 tablet (150 mg total) by mouth daily.   glipiZIDE (GLUCOTROL XL) 5 MG 24 hr tablet Take 1 tablet (5 mg total) by mouth every evening.   glucose blood test strip Use as instructed   ibuprofen (ADVIL,MOTRIN) 200 MG tablet Take 200 mg by mouth every 6 (six) hours as needed. Reported on 03/22/2015   insulin detemir (LEVEMIR FLEXTOUCH) 100 UNIT/ML FlexPen INJECT 35 UNITS SUBCUTANEOUSLY AT BEDTIME (Patient taking differently: 20 Units 2 (two) times daily.)   Insulin Pen Needle (RELION PEN NEEDLES) 31G X 6 MM MISC Use daily   Multiple Vitamins-Minerals (MULTIPLE VITAMINS/WOMENS PO) Take by mouth.   olmesartan (BENICAR) 40 MG tablet Take 1 tablet (40 mg total) by mouth daily.   Semaglutide,0.25 or 0.5MG /DOS, 2 MG/3ML SOPN Inject 0.25 mg into the skin once a week.   spironolactone (ALDACTONE) 25 MG tablet Take 1 tablet (25 mg total) by mouth daily.   No facility-administered encounter medications on file as of 09/05/2021.  Reviewed chart for medication changes ahead of medication coordination call.  No OVs, Consults, or hospital visits since last care coordination call/Pharmacist visit. (If appropriate, list visit date, provider name)  No medication changes  indicated OR if recent visit, treatment plan here.  BP Readings from Last 3 Encounters:  09/02/21 (!) 160/80  04/29/21 140/72  01/28/21 (!) 200/100    Lab Results  Component Value Date   HGBA1C 6.9 (A) 09/02/2021     Patient obtains medications through Adherence Packaging  30 Days    Last adherence delivery included:  Glipizide ER 5 mg - take 1 tablet with dinner Spironolactone 25 mg - take 1 tablet at breakfast Olmesartan 40 mg  - take 1 tablet with dinner Amlodipine 10 mg  - take 1 tablet at breakfast Vitafusion - chew 1 gummy every morning    Patient declined the following medications:   Levemir Flextouch 100 un/ml - 20 units twice daily Pen needles - use as directed  Patient no longer taking/using:  Accuchek guide test strips - Now has dexcom   Patient is due for next adherence delivery on: 09/17/2021   Called patient and reviewed medications and coordinated delivery.   This delivery to include: Glipizide ER 5 mg - take 1 tablet with dinner Spironolactone 25 mg - take 1 tablet at breakfast Olmesartan 40 mg  - take 1 tablet with dinner Amlodipine 10 mg  - take 1 tablet at breakfast   Patient will need a short fill: No short fills needed    Coordinated acute fill: No acute fills needed    Patient declined the following medications: She has plenty on hand Levemir Flextouch 100 un/ml - 20 units twice daily Pen needles - use as directed (not due til 10/23/2021) Vitafusion -  chew 1 gummy every morning (not due til 10/25/2021)   Patient no longer taking/using:  Accuchek guide test strips - Now has dexcom   Confirmed delivery date of 09/19/2021 per patients request, advised patient that pharmacy will contact them the morning of delivery.   Care Gaps: AWV - scheduled 10/21/2021 Last BP - 160/80 on 09/02/2021 Last A1C - 6.9 on 09/02/2021 Covid booster - overdue Malb - overdue Flu - due  Star Rating Drugs: Glipizide 5 mg - last filled 08/11/2021 30 DS at  [facility] Olmesartan 40 mg - last filled 08/11/2021 30 DS at [facility] Semaglutide 0.25 mg - New script sent to [facility] on 09/02/2021  Inetta Fermo [facility]  Clinical Pharmacist Assistant [phone]

## 2021-10-03 DIAGNOSIS — H35032 Hypertensive retinopathy, left eye: Secondary | ICD-10-CM | POA: Diagnosis not present

## 2021-10-03 DIAGNOSIS — R6889 Other general symptoms and signs: Secondary | ICD-10-CM | POA: Diagnosis not present

## 2021-10-03 DIAGNOSIS — E113493 Type 2 diabetes mellitus with severe nonproliferative diabetic retinopathy without macular edema, bilateral: Secondary | ICD-10-CM | POA: Diagnosis not present

## 2021-10-03 LAB — HM DIABETES EYE EXAM

## 2021-10-07 ENCOUNTER — Telehealth: Payer: Self-pay | Admitting: Pharmacist

## 2021-10-07 NOTE — Chronic Care Management (AMB) (Signed)
    Chronic Care Management Pharmacy Assistant   Name: Katrina Garrison  MRN: [id number] DOB: 09-05-1940   10/07/21 APPOINTMENT REMINDER  Patient was reminded to have all medications, supplements and any blood glucose and blood pressure readings available for review with Gaylord Shih, Pharm. D, for telephone visit on 10/08/21 at 3:30.    Care Gaps: COVID Booster - Overdue Urine Micro - Overdue Flu Vaccine - Overdue BP- 160/80 09/02/21 AWV- 9/22 Lab Results  Component Value Date   HGBA1C 6.9 (A) 09/02/2021    Star Rating Drug: Glipizide 5 mg - last filled 09/12/2021 30 [facility] Olmesartan 40 mg - last filled 09/12/2021 30 [facility] at [facility] Semaglutide 0.25 mg - last filled 09/12/2021 56 [facility] at [facility]  Any gaps in medications fill history?    SIG:   Medications: Outpatient Encounter Medications as of 10/07/2021  Medication Sig   amLODipine (NORVASC) 10 MG tablet Take 1 tablet (10 mg total) by mouth daily.   aspirin 81 MG tablet Take 81 mg by mouth as needed.   fluconazole (DIFLUCAN) 150 MG tablet Take 1 tablet (150 mg total) by mouth daily.   glipiZIDE (GLUCOTROL XL) 5 MG 24 hr tablet Take 1 tablet (5 mg total) by mouth every evening.   glucose blood test strip Use as instructed   ibuprofen (ADVIL,MOTRIN) 200 MG tablet Take 200 mg by mouth every 6 (six) hours as needed. Reported on 03/22/2015   insulin detemir (LEVEMIR FLEXTOUCH) 100 UNIT/ML FlexPen INJECT 35 UNITS SUBCUTANEOUSLY AT BEDTIME (Patient taking differently: 20 Units 2 (two) times daily.)   Insulin Pen Needle (RELION PEN NEEDLES) 31G X 6 MM MISC Use daily   Multiple Vitamins-Minerals (MULTIPLE VITAMINS/WOMENS PO) Take by mouth.   olmesartan (BENICAR) 40 MG tablet Take 1 tablet (40 mg total) by mouth daily.   Semaglutide,0.25 or 0.5MG /DOS, 2 MG/3ML SOPN Inject 0.25 mg into the skin once a week.   spironolactone (ALDACTONE) 25 MG tablet Take 1 tablet (25 mg total) by mouth daily.   No facility-administered  encounter medications on file as of 10/07/2021.      Pamala Duffel CMA Clinical Pharmacist Assistant [phone]

## 2021-10-08 ENCOUNTER — Ambulatory Visit (INDEPENDENT_AMBULATORY_CARE_PROVIDER_SITE_OTHER): Payer: Medicare HMO | Admitting: Pharmacist

## 2021-10-08 DIAGNOSIS — I10 Essential (primary) hypertension: Secondary | ICD-10-CM

## 2021-10-08 DIAGNOSIS — E1165 Type 2 diabetes mellitus with hyperglycemia: Secondary | ICD-10-CM

## 2021-10-08 NOTE — Progress Notes (Signed)
Chronic Care Management Pharmacy Note  10/20/2021 Name:  Katrina Garrison MRN:  [id number] DOB:  1940/12/09  Summary: BP not at goal < 140/90 A1c is at goal < 7% Pt is tolerating Ozempic ok so far  Recommendations/Changes made from today's visit: -Recommended monitoring blood pressure daily -Recommended injecting Ozempic x 3 more weeks to see if stomach cramping subsides -Recommended connecting with Dexcom clarity  Plan: Follow up on Ozempic tolerance in 3 weeks  Subjective: Katrina Garrison is an 81 y.o. year old female who is a primary patient of Dorothyann Peng, NP.  The CCM team was consulted for assistance with disease management and care coordination needs.    Engaged with patient by telephone for follow up visit in response to provider referral for pharmacy case management and/or care coordination services.   Consent to Services:  The patient was given information about Chronic Care Management services, agreed to services, and gave verbal consent prior to initiation of services.  Please see initial visit note for detailed documentation.   Patient Care Team: Dorothyann Peng, NP as PCP - General (Family Medicine) Viona Gilmore, [facility] as Pharmacist (Pharmacist)  Recent office visits: 09/02/2021 Dorothyann Peng NP - Patient was seen for diabetes follow up. Re-started Semaglutide 0.25 mg weekly. Follow up in 3 months.   04/29/2021 Dorothyann Peng NP: Patient was seen for Uncontrolled type 2 diabetes mellitus with hyperglycemia, with long-term current use of insulin and an additional issue. Increased Semaglutide to 12m weekly. Follow up in 3 months.   Recent consult visits: None  Hospital visits: None in previous 6 months   Objective:  Lab Results  Component Value Date   CREATININE 0.97 01/28/2021   BUN 19 01/28/2021   GFR 55.43 (L) 01/28/2021   GFRNONAA 51 (L) 11/03/2019   GFRAA 59 (L) 11/03/2019   NA 138 01/28/2021   K 3.9 01/28/2021   CALCIUM 9.4 01/28/2021    CO2 26 01/28/2021   GLUCOSE 238 (H) 01/28/2021    Lab Results  Component Value Date/Time   HGBA1C 6.9 (A) 09/02/2021 09:58 AM   HGBA1C 8.6 (A) 04/29/2021 09:42 AM   HGBA1C 10.5 (H) 01/28/2021 10:25 AM   HGBA1C 8.0 (A) 11/08/2020 01:37 PM   HGBA1C 10.0 (H) 04/24/2020 10:17 AM   GFR 55.43 (L) 01/28/2021 10:25 AM   GFR 52.47 (L) 04/24/2020 10:17 AM   MICROALBUR 103.8 (H) 01/25/2020 02:40 PM   MICROALBUR 1.98 (H) 10/14/2012 08:47 AM    Last diabetic Eye exam:  Lab Results  Component Value Date/Time   HMDIABEYEEXA Retinopathy (A) 10/03/2021 12:00 AM    Last diabetic Foot exam: No results found for: "HMDIABFOOTEX"   Lab Results  Component Value Date   CHOL 154 01/28/2021   HDL 44.90 01/28/2021   LDLCALC 88 01/28/2021   TRIG 106.0 01/28/2021   CHOLHDL 3 01/28/2021       Latest Ref Rng & Units 01/28/2021   10:25 AM 01/25/2020    2:40 PM 11/03/2019   11:07 AM  Hepatic Function  Total Protein 6.0 - 8.3 g/dL 7.5  7.6  7.2   Albumin 3.5 - 5.2 g/dL 3.8  4.2    AST 0 - 37 U/L _0 ALT 0 - 35 U/L _1 Alk Phosphatase 39 - 117 U/L 81  89    Total Bilirubin 0.2 - 1.2 mg/dL 0.7  0.6  0.5     Lab Results  Component Value Date/Time  TSH 2.02 01/28/2021 10:25 AM   TSH 1.51 01/25/2020 02:40 PM       Latest Ref Rng & Units 01/28/2021   10:25 AM 01/25/2020    2:40 PM 11/03/2019   11:07 AM  CBC  WBC 4.0 - 10.5 K/uL 6.5  8.5  8.7   Hemoglobin 12.0 - 15.0 g/dL 12.4  12.7  12.8   Hematocrit 36.0 - 46.0 % 39.0  38.3  40.7   Platelets 150.0 - 400.0 K/uL 226.0  264.0  241     Lab Results  Component Value Date/Time   VD25OH 36 11/03/2019 11:07 AM   VD25OH 22.52 (L) 12/04/2015 10:41 AM   VD25OH 20.23 (L) 11/21/2014 11:04 AM    Clinical ASCVD: No  The ASCVD Risk score (Arnett DK, et al., 2019) failed to calculate for the following reasons:   The 2019 ASCVD risk score is only valid for ages 6 to 56       01/28/2021    9:53 AM 10/08/2020   11:43 AM 01/25/2020     1:51 PM  Depression screen PHQ 2/9  Decreased Interest 0 0 0  Down, Depressed, Hopeless 0 0 0  PHQ - 2 Score 0 0 0     Social History   Tobacco Use  Smoking Status Former   Years: 1.50   Types: Cigarettes   Quit date: 01/28/1985   Years since quitting: 36.7  Smokeless Tobacco Never   BP Readings from Last 3 Encounters:  09/02/21 (!) 160/80  04/29/21 140/72  01/28/21 (!) 200/100   Pulse Readings from Last 3 Encounters:  09/02/21 72  04/29/21 91  01/28/21 (!) 106   Wt Readings from Last 3 Encounters:  09/02/21 122 lb (55.3 kg)  04/29/21 133 lb (60.3 kg)  01/28/21 135 lb (61.2 kg)   BMI Readings from Last 3 Encounters:  09/02/21 23.05 kg/m  04/29/21 25.13 kg/m  01/28/21 25.51 kg/m    Assessment/Interventions: Review of patient past medical history, allergies, medications, health status, including review of consultants reports, laboratory and other test data, was performed as part of comprehensive evaluation and provision of chronic care management services.   SDOH:  (Social Determinants of Health) assessments and interventions performed: Yes (last 03/11/21) SDOH Interventions    Flowsheet Row Chronic Care Management from 03/11/2021 in [facility] from 11/07/2019 in [facility] Interventions -- Intervention Not Indicated  Housing Interventions -- Intervention Not Indicated  Transportation Interventions Intervention Not Indicated Intervention Not Indicated  Depression Interventions/Treatment  -- PHQ2-9 Score <4 Follow-up Not Indicated  Financial Strain Interventions Intervention Not Indicated Intervention Not Indicated  Physical Activity Interventions -- Intervention Not Indicated  Stress Interventions -- Intervention Not Indicated  Social Connections Interventions -- Intervention Not Indicated       SDOH Screenings   Food Insecurity: No Food Insecurity (10/08/2020)   Housing: Low Risk  (10/08/2020)  Transportation Needs: No Transportation Needs (03/12/2021)  Alcohol Screen: Low Risk  (11/07/2019)  Depression (PHQ2-9): Low Risk  (01/28/2021)  Financial Resource Strain: Low Risk  (03/12/2021)  Physical Activity: Sufficiently Active (10/08/2020)  Social Connections: Moderately Integrated (10/08/2020)  Stress: No Stress Concern Present (10/08/2020)  Tobacco Use: Medium Risk (09/02/2021)    CCM Care Plan  Allergies  Allergen Reactions   Hydrochlorothiazide    Jardiance [Empagliflozin]     Yeast infections    Lisinopril Cough   Losartan Cough   Metformin And Related Diarrhea  Metoprolol Swelling    Leg cramping    Novolin R [Insulin] Itching   Sulfamethoxazole     REACTION: "comatose"   Toujeo Solostar [Insulin Glargine] Rash    Medications Reviewed Today     Reviewed by Franco Collet, CMA (Certified Medical Assistant) on 09/02/21 at [phone]  Med List Status: <None>   Medication Order Taking? Sig Documenting Provider Last Dose Status Informant  amLODipine (NORVASC) 10 MG tablet [id number] Yes Take 1 tablet (10 mg total) by mouth daily. Nafziger, Tommi Rumps, NP Taking Active   aspirin 81 MG tablet [id number] Yes Take 81 mg by mouth as needed. [provider] Taking Active   fluconazole (DIFLUCAN) 150 MG tablet [id number] Yes Take 1 tablet (150 mg total) by mouth daily. Nafziger, Tommi Rumps, NP Taking Active   glipiZIDE (GLUCOTROL XL) 5 MG 24 hr tablet [id number] Yes TAKE ONE TABLET BY MOUTH EVERY EVENING Nafziger, Tommi Rumps, NP Taking Active   glucose blood test strip [id number] Yes Use as instructed Nafziger, Tommi Rumps, NP Taking Active   ibuprofen (ADVIL,MOTRIN) 200 MG tablet [id number] Yes Take 200 mg by mouth every 6 (six) hours as needed. Reported on 03/22/2015 [provider] Taking Active Self  insulin detemir (LEVEMIR FLEXTOUCH) 100 UNIT/ML FlexPen [id number] Yes INJECT 35 UNITS SUBCUTANEOUSLY AT BEDTIME  Patient taking differently: 20 Units 2 (two) times  daily.   Dorothyann Peng, NP Taking Active   Insulin Pen Needle (RELION PEN NEEDLES) 31G X 6 MM MISC [id number] Yes Use daily Nafziger, Tommi Rumps, NP Taking Active   Multiple Vitamins-Minerals (MULTIPLE VITAMINS/WOMENS PO) [id number] Yes Take by mouth. [provider] Taking Active   olmesartan (BENICAR) 40 MG tablet [id number]  Take 1 tablet (40 mg total) by mouth daily. Dorothyann Peng, NP  Expired 05/02/21 2359   spironolactone (ALDACTONE) 25 MG tablet [id number] Yes Take 1 tablet (25 mg total) by mouth daily. Dorothyann Peng, NP Taking Active             Patient Active Problem List   Diagnosis Date Noted   Uncontrolled type 2 diabetes mellitus with hyperglycemia, with long-term current use of insulin ([place]) 03/08/2015   Hyperlipidemia 11/23/2013   Diverticulosis 02/21/2013   Ischemic colitis ([place]) 01/20/2013   GI bleed 01/18/2013   Extrinsic asthma, unspecified 03/17/2012   Essential hypertension 08/09/2006   GERD 08/09/2006    Immunization History  Administered Date(s) Administered   Fluad Quad(high Dose 65+) 11/03/2019, 11/08/2020   Influenza Split 12/16/2010, 11/11/2011, 11/08/2012   Influenza Whole 01/26/2005, 12/06/2009   Influenza, High Dose Seasonal PF 10/03/2014, 12/04/2015, 11/19/2016, 10/25/2017   Influenza,inj,Quad PF,6+ Mos 11/23/2013   PFIZER(Purple Top)SARS-COV-2 Vaccination 02/17/2019, 03/10/2019   Pneumococcal Conjugate-13 10/06/2013, 06/10/2017, 06/11/2017   Pneumococcal Polysaccharide-23 01/27/2003, 12/04/2015   Td 01/27/2003, 10/06/2013   Tdap 06/10/2017   Zoster Recombinat (Shingrix) 06/10/2017, 03/25/2018    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 She is traveling all over the state and enjoys this.   She is having cramping pains in her stomach and she gets up 4-5 times a night to defecate some nights and it's more liquid. She is having 2 pieces of bacon and tea and toast with breakfast. She wants to try the next  3 injections and we can see then if she is tolerating the Ozempic better.  Patient is not checking BP on a daily basis as she should be but will start back regularly.  Conditions to be addressed/monitored:  Hypertension, Hyperlipidemia, Diabetes, and GERD  Conditions addressed  this visit: Hypertension, diabetes  Care Plan : CCM Pharmacy Care Plan  Updates made by Viona Gilmore, [place] since 10/20/2021 12:00 AM     Problem: Problem: Hypertension, Hyperlipidemia, Diabetes, and GERD      Long-Range Goal: Patient-Specific Goal   Start Date: 03/11/2021  Expected End Date: 03/11/2022  Recent Progress: On track  Priority: High  Note:   Current Barriers:  Unable to independently monitor therapeutic efficacy Unable to achieve control of diabetes and hypertension  Unable to self administer medications as prescribed  Pharmacist Clinical Goal(s):  Patient will verbalize ability to afford treatment regimen achieve adherence to monitoring guidelines and medication adherence to achieve therapeutic efficacy achieve control of diabetes and hypertension as evidenced by A1c and blood pressure readings  through collaboration with PharmD and provider.   Interventions: 1:1 collaboration with Dorothyann Peng, NP regarding development and update of comprehensive plan of care as evidenced by provider attestation and co-signature Inter-disciplinary care team collaboration (see longitudinal plan of care) Comprehensive medication review performed; medication list updated in electronic medical record  Hypertension (BP goal <140/90) -Not ideally controlled -Current treatment: Amlodipine 10 mg 1 tablet daily - in AM - Appropriate, Query effective, Safe, Accessible Olmesartan 40 mg 1 tablet daily - in PM - Appropriate, Query effective, Safe, Accessible Spironolactone 25 mg 1 tablet daily - in PM - Appropriate, Query effective, Safe, Accessible -Medications previously tried: HCTZ (unknown reaction)   -Current home readings: 136-151; Omron arm cuff   -Current dietary habits: using [place] salt; recommended lower sodium salt; no more canned foods, more frozen foods now; [place] lunch meat  -Current exercise habits: not doing anything super consistently -Reports hypotensive/hypertensive symptoms -Educated on BP goals and benefits of medications for prevention of heart attack, stroke and kidney damage; Daily salt intake goal < 2300 mg; Exercise goal of 150 minutes per week; Importance of home blood pressure monitoring; Proper BP monitoring technique; -Counseled to monitor BP at home every other day, document, and provide log at future appointments -Counseled on diet and exercise extensively Recommended to continue current medication  Hyperlipidemia: (LDL goal < 100) -Controlled -Current treatment: No medications -Medications previously tried: none  -Current dietary patterns: chicken and fish and not much red meat -Current exercise habits: nothing consistently -Educated on Cholesterol goals;  Benefits of statin for ASCVD risk reduction; Importance of limiting foods high in cholesterol; -Counseled on diet and exercise extensively Consider moderate intensity statin.  Diabetes (A1c goal <7%) -Controlled -Current medications: Levemir 100 units/mL inject 20 units in the morning and 16 units in the evening - Appropriate, Effective, Safe, Accessible Glipizide XL 5 mg  1 tablet daily with breakfast - Appropriate, Effective, Safe, Accessible  Ozempic 0.25 mg inject once weekly (Mondays) - Appropriate, Effective, Safe, Accessible -Medications previously tried: Novolin, Januvia (chest pain), Jardiance (yeast infections), Toujeo, Tresiba, metformin (diarrhea) -Current home glucose readings fasting glucose: 122 this morning, 128, 123, 114, 107, 124 post prandial glucose: n/a -Denies hypoglycemic/hyperglycemic symptoms -Current meal patterns: 2 meals a day breakfast: spinach, onions and  eggs, sausage  lunch: not often  dinner: fish, chicken, kale; prime rib once in a blue moon; pasta salad snacks: peanuts, almonds, popcorn, apples, oranges, lemons; not many sweets, plain yogurt drinks: green tea, water, coffee (once a week), hibiscus tea -Current exercise: was doing an exercise class at the gym; postponed because of the holidays and COVID -Educated on A1c and blood sugar goals; Exercise goal of 150 minutes per week; Benefits of routine self-monitoring of blood sugar; Continuous  glucose monitoring; Carbohydrate counting and/or plate method -Counseled to check feet daily and get yearly eye exams - Sent link to connect to [facility] clarity.  Osteopenia (Goal prevent fractures) -Not ideally controlled -Last DEXA Scan: 2016   T-Score femoral neck: -1.5  T-Score total hip: n/a  T-Score lumbar spine: -1.3  T-Score forearm radius: n/a  10-year probability of major osteoporotic fracture: 4.9%  10-year probability of hip fracture: 0.9% -Patient is not a candidate for pharmacologic treatment -Current treatment  Multivitamin 1 tablet daily - Appropriate, Effective, Safe, Accessible -Medications previously tried: none  -Recommend [phone] units of vitamin D daily. Recommend 1200 mg of calcium daily from dietary and supplemental sources. -Recommended scheduling repeat bone density scan.  Health Maintenance -Vaccine gaps: none -Current therapy:  Ibuprofen 200 mg as needed Aspirin 81 mg 1 tablet as needed -Educated on Cost vs benefit of each product must be carefully weighed by individual consumer -Patient is satisfied with current therapy and denies issues -Counseled on risk vs benefits of aspirin therapy for primary prevention.  Patient Goals/Self-Care Activities Patient will:  - take medications as prescribed as evidenced by patient report and record review check glucose daily, document, and provide at future appointments check blood pressure a few times a week, document,  and provide at future appointments target a minimum of 150 minutes of moderate intensity exercise weekly  Follow Up Plan: The care management team will reach out to the patient again over the next 14 days.        Medication Assistance: None required.  Patient affirms current coverage meets needs.  Compliance/Adherence/Medication fill history: Care Gaps: Hep C screening, COVID booster, urine microalbumin Last BP - 160/80 09/02/21 Last A1C - 6.9 on 09/02/2021  Star-Rating Drugs: Glipizide 5 mg - last filled 09/12/2021 30 DS at [facility] Olmesartan 40 mg - last filled 09/12/2021 30 DS at [facility] Semaglutide 0.25 mg - last filled 09/12/2021 56 DS at [facility]  Patient's preferred pharmacy is:  [facility] ([facility] [place] - [place] [address] [place] ([place]) [place] [address] Phone: [phone] Fax: [phone]  [facility] - [place], [place] - [address] [place] [address] Phone: [phone] Fax: [phone]  Uses pill box? No - compliance packaging Pt endorses 95% compliance  We discussed: Benefits of medication synchronization, packaging and delivery as well as enhanced pharmacist oversight with [facility]. Patient decided to: Utilize [facility] pharmacy for medication synchronization, packaging and delivery  Care Plan and Follow Up Patient Decision:  Patient agrees to Care Plan and Follow-up.  Plan: The care management team will reach out to the patient again over the next 30 days.  Jeni Salles, PharmD, [place] Pharmacist [facility] [place]  Patient is due for next adherence delivery on: 10/17/2021   Called patient and reviewed medications and coordinated delivery.   This delivery to include: Glipizide ER 5 mg - take 1 tablet with dinner Spironolactone 25 mg - take 1 tablet at breakfast Olmesartan 40 mg  - take 1 tablet with dinner Amlodipine  10 mg  - take 1 tablet at breakfast Pen needles - use as directed   Patient will need a short fill: No short fills needed    Coordinated acute fill: No acute fills needed    Patient declined the following medications: She has plenty on hand Levemir Flextouch 100 un/ml - 20 units twice daily (has plenty) Ozempic - not due until 11/07/21  Accuchek guide test strips - using PRN Vitafusion - chew 1 gummy every morning (vial)

## 2021-10-09 ENCOUNTER — Encounter: Payer: Self-pay | Admitting: Adult Health

## 2021-10-20 NOTE — Patient Instructions (Signed)
Hi Wendolyn,  It was great to catch up again! I'll have Janie check back in on you in a few weeks to see how the Ozempic is going. Don't forget to start back with checking your blood pressures at home as well.  Please reach out to me if you have any questions or need anything!  Best, Maddie  Jeni Salles, PharmD, [place] Pharmacist [facility] [place]

## 2021-10-21 ENCOUNTER — Ambulatory Visit: Payer: Medicare HMO

## 2021-10-21 ENCOUNTER — Ambulatory Visit (INDEPENDENT_AMBULATORY_CARE_PROVIDER_SITE_OTHER): Payer: Medicare HMO

## 2021-10-21 VITALS — Ht 62.0 in | Wt 122.0 lb

## 2021-10-21 DIAGNOSIS — Z0000 Encounter for general adult medical examination without abnormal findings: Secondary | ICD-10-CM

## 2021-10-21 NOTE — Patient Instructions (Signed)
[name] , Thank you for taking time to come for your Medicare Wellness Visit. I appreciate your ongoing commitment to your health goals. Please review the following plan we discussed and let me know if I can assist you in the future.   Screening recommendations/referrals: Colonoscopy: not required Mammogram: not required Bone Density: completed 01/15/2015 Recommended yearly ophthalmology/optometry visit for glaucoma screening and checkup Recommended yearly dental visit for hygiene and checkup  Vaccinations: Influenza vaccine: due Pneumococcal vaccine: completed 06/11/2017 Tdap vaccine: completed 06/10/2017, due 06/11/2027 Shingles vaccine: completed   Covid-19: 03/10/2019, 02/17/2019  Advanced directives: Please bring a copy of your POA (Power of Attorney) and/or Living Will to your next appointment.   Conditions/risks identified: none  Next appointment: Follow up in one year for your annual wellness visit    Preventive Care 65 Years and Older, Female Preventive care refers to lifestyle choices and visits with your health care provider that can promote health and wellness. What does preventive care include? A yearly physical exam. This is also called an annual well check. Dental exams once or twice a year. Routine eye exams. Ask your health care provider how often you should have your eyes checked. Personal lifestyle choices, including: Daily care of your teeth and gums. Regular physical activity. Eating a healthy diet. Avoiding tobacco and drug use. Limiting alcohol use. Practicing safe sex. Taking low-dose aspirin every day. Taking vitamin and mineral supplements as recommended by your health care provider. What happens during an annual well check? The services and screenings done by your health care provider during your annual well check will depend on your age, overall health, lifestyle risk factors, and family history of disease. Counseling  Your health care provider  may ask you questions about your: Alcohol use. Tobacco use. Drug use. Emotional well-being. Home and relationship well-being. Sexual activity. Eating habits. History of falls. Memory and ability to understand (cognition). Work and work Statistician. Reproductive health. Screening  You may have the following tests or measurements: Height, weight, and BMI. Blood pressure. Lipid and cholesterol levels. These may be checked every 5 years, or more frequently if you are over 61 years old. Skin check. Lung cancer screening. You may have this screening every year starting at age 64 if you have a 30-pack-year history of smoking and currently smoke or have quit within the past 15 years. Fecal occult blood test (FOBT) of the stool. You may have this test every year starting at age 36. Flexible sigmoidoscopy or colonoscopy. You may have a sigmoidoscopy every 5 years or a colonoscopy every 10 years starting at age 28. Hepatitis C blood test. Hepatitis B blood test. Sexually transmitted disease (STD) testing. Diabetes screening. This is done by checking your blood sugar (glucose) after you have not eaten for a while (fasting). You may have this done every 1-3 years. Bone density scan. This is done to screen for osteoporosis. You may have this done starting at age 30. Mammogram. This may be done every 1-2 years. Talk to your health care provider about how often you should have regular mammograms. Talk with your health care provider about your test results, treatment options, and if necessary, the need for more tests. Vaccines  Your health care provider may recommend certain vaccines, such as: Influenza vaccine. This is recommended every year. Tetanus, diphtheria, and acellular pertussis (Tdap, Td) vaccine. You may need a Td booster every 10 years. Zoster vaccine. You may need this after age 13. Pneumococcal 13-valent conjugate (PCV13) vaccine. One dose is recommended  after age 21. Pneumococcal  polysaccharide (PPSV23) vaccine. One dose is recommended after age 42. Talk to your health care provider about which screenings and vaccines you need and how often you need them. This information is not intended to replace advice given to you by your health care provider. Make sure you discuss any questions you have with your health care provider. Document Released: 02/08/2015 Document Revised: 10/02/2015 Document Reviewed: 11/13/2014 Elsevier Interactive Patient Education  2017 [place] Prevention in the Home Falls can cause injuries. They can happen to people of all ages. There are many things you can do to make your home safe and to help prevent falls. What can I do on the outside of my home? Regularly fix the edges of walkways and driveways and fix any cracks. Remove anything that might make you trip as you walk through a door, such as a raised step or threshold. Trim any bushes or trees on the path to your home. Use bright outdoor lighting. Clear any walking paths of anything that might make someone trip, such as rocks or tools. Regularly check to see if handrails are loose or broken. Make sure that both sides of any steps have handrails. Any raised decks and porches should have guardrails on the edges. Have any leaves, snow, or ice cleared regularly. Use sand or salt on walking paths during winter. Clean up any spills in your garage right away. This includes oil or grease spills. What can I do in the bathroom? Use night lights. Install grab bars by the toilet and in the tub and shower. Do not use towel bars as grab bars. Use non-skid mats or decals in the tub or shower. If you need to sit down in the shower, use a plastic, non-slip stool. Keep the floor dry. Clean up any water that spills on the floor as soon as it happens. Remove soap buildup in the tub or shower regularly. Attach bath mats securely with double-sided non-slip rug tape. Do not have throw rugs and other  things on the floor that can make you trip. What can I do in the bedroom? Use night lights. Make sure that you have a light by your bed that is easy to reach. Do not use any sheets or blankets that are too big for your bed. They should not hang down onto the floor. Have a firm chair that has side arms. You can use this for support while you get dressed. Do not have throw rugs and other things on the floor that can make you trip. What can I do in the kitchen? Clean up any spills right away. Avoid walking on wet floors. Keep items that you use a lot in easy-to-reach places. If you need to reach something above you, use a strong step stool that has a grab bar. Keep electrical cords out of the way. Do not use floor polish or wax that makes floors slippery. If you must use wax, use non-skid floor wax. Do not have throw rugs and other things on the floor that can make you trip. What can I do with my stairs? Do not leave any items on the stairs. Make sure that there are handrails on both sides of the stairs and use them. Fix handrails that are broken or loose. Make sure that handrails are as long as the stairways. Check any carpeting to make sure that it is firmly attached to the stairs. Fix any carpet that is loose or worn. Avoid having throw  rugs at the top or bottom of the stairs. If you do have throw rugs, attach them to the floor with carpet tape. Make sure that you have a light switch at the top of the stairs and the bottom of the stairs. If you do not have them, ask someone to add them for you. What else can I do to help prevent falls? Wear shoes that: Do not have high heels. Have rubber bottoms. Are comfortable and fit you well. Are closed at the toe. Do not wear sandals. If you use a stepladder: Make sure that it is fully opened. Do not climb a closed stepladder. Make sure that both sides of the stepladder are locked into place. Ask someone to hold it for you, if possible. Clearly  mark and make sure that you can see: Any grab bars or handrails. First and last steps. Where the edge of each step is. Use tools that help you move around (mobility aids) if they are needed. These include: Canes. Walkers. Scooters. Crutches. Turn on the lights when you go into a dark area. Replace any light bulbs as soon as they burn out. Set up your furniture so you have a clear path. Avoid moving your furniture around. If any of your floors are uneven, fix them. If there are any pets around you, be aware of where they are. Review your medicines with your doctor. Some medicines can make you feel dizzy. This can increase your chance of falling. Ask your doctor what other things that you can do to help prevent falls. This information is not intended to replace advice given to you by your health care provider. Make sure you discuss any questions you have with your health care provider. Document Released: 11/08/2008 Document Revised: 06/20/2015 Document Reviewed: 02/16/2014 Elsevier Interactive Patient Education  2017 [employer].

## 2021-10-21 NOTE — Progress Notes (Signed)
I connected with Katrina Garrison today by telephone and verified that I am speaking with the correct person using two identifiers. Location patient: home Location provider: work Persons participating in the virtual visit: Katrina Garrison, Katrina Garrison.   I discussed the limitations, risks, security and privacy concerns of performing an evaluation and management service by telephone and the availability of in person appointments. I also discussed with the patient that there may be a patient responsible charge related to this service. The patient expressed understanding and verbally consented to this telephonic visit.    Interactive audio and video telecommunications were attempted between this provider and patient, however failed, due to patient having technical difficulties OR patient did not have access to video capability.  We continued and completed visit with audio only.     Vital signs may be patient reported or missing.  Subjective:   Katrina Garrison is a 81 y.o. female who presents for Medicare Annual (Subsequent) preventive examination.  Review of Systems     Cardiac Risk Factors include: advanced age (>39men, >95 women);diabetes mellitus;hypertension     Objective:    Today's Vitals   10/21/21 1527  Weight: 122 lb (55.3 kg)  Height: 5\' 2"  (1.575 m)   Body mass index is 22.31 kg/m.     10/21/2021    3:35 PM 10/08/2020   11:45 AM 11/07/2019    2:17 PM 03/26/2015    9:52 AM 01/18/2013    6:02 PM  Advanced Directives  Does Patient Have a Medical Advance Directive? Yes Yes Yes Yes Patient does not have advance directive;Patient would not like information  Type of 01/20/2013 Power of [place];Living will Healthcare Power of [place];Living will Healthcare Power of [place];Living will    Does patient want to make changes to medical advance directive?   No - Patient declined    Copy of Healthcare Power of Attorney in Chart? No - copy requested  No - copy requested No - copy requested    Pre-existing out of facility DNR order (yellow form or pink MOST form)     No    Current Medications (verified) Outpatient Encounter Medications as of 10/21/2021  Medication Sig   amLODipine (NORVASC) 10 MG tablet Take 1 tablet (10 mg total) by mouth daily.   aspirin 81 MG tablet Take 81 mg by mouth as needed.   glipiZIDE (GLUCOTROL XL) 5 MG 24 hr tablet Take 1 tablet (5 mg total) by mouth every evening.   glucose blood test strip Use as instructed   ibuprofen (ADVIL,MOTRIN) 200 MG tablet Take 200 mg by mouth every 6 (six) hours as needed. Reported on 03/22/2015   insulin detemir (LEVEMIR FLEXTOUCH) 100 UNIT/ML FlexPen INJECT 35 UNITS SUBCUTANEOUSLY AT BEDTIME (Patient taking differently: 20 Units 2 (two) times daily.)   Insulin Pen Needle (RELION PEN NEEDLES) 31G X 6 MM MISC Use daily   Multiple Vitamins-Minerals (MULTIPLE VITAMINS/WOMENS PO) Take by mouth.   Semaglutide,0.25 or 0.5MG /DOS, 2 MG/3ML SOPN Inject 0.25 mg into the skin once a week.   spironolactone (ALDACTONE) 25 MG tablet Take 1 tablet (25 mg total) by mouth daily.   fluconazole (DIFLUCAN) 150 MG tablet Take 1 tablet (150 mg total) by mouth daily. (Patient not taking: Reported on 10/21/2021)   olmesartan (BENICAR) 40 MG tablet Take 1 tablet (40 mg total) by mouth daily.   No facility-administered encounter medications on file as of 10/21/2021.    Allergies (verified) Hydrochlorothiazide, Jardiance [empagliflozin], Lisinopril, Losartan, Metformin and related, Metoprolol, Novolin  r [insulin], Sulfamethoxazole, and Toujeo solostar [insulin glargine]   History: Past Medical History:  Diagnosis Date   Anemia    Diabetes mellitus type II 2005   Diverticulosis    GERD (gastroesophageal reflux disease)    History of lithotripsy    x3 for kidney stones   Hypertension    Kidney stones    Past Surgical History:  Procedure Laterality Date   ABDOMINAL HYSTERECTOMY     childbirth x 2      EYE SURGERY     cataract   Family History  Problem Relation Age of Onset   Thyroid cancer Mother    Diabetes Mother    Hypertension Mother    Alcohol abuse Father    Hypertension Father    Multiple sclerosis Daughter    Multiple sclerosis Daughter    Multiple sclerosis Sister    Social History   Socioeconomic History   Marital status: Divorced    Spouse name: Not on file   Number of children: Not on file   Years of education: Not on file   Highest education level: Not on file  Occupational History   Occupation: Retired  Tobacco Use   Smoking status: Former    Years: 1.50    Types: Cigarettes    Quit date: 01/28/1985    Years since quitting: 36.7   Smokeless tobacco: Never  Vaping Use   Vaping Use: Never used  Substance and Sexual Activity   Alcohol use: No   Drug use: No   Sexual activity: Not on file  Other Topics Concern   Not on file  Social History Narrative   Not on file   Social Determinants of Health   Financial Resource Strain: Low Risk  (10/21/2021)   Overall Financial Resource Strain (CARDIA)    Difficulty of Paying Living Expenses: Not hard at all  Food Insecurity: No Food Insecurity (10/21/2021)   Hunger Vital Sign    Worried About Running Out of Food in the Last Year: Never true    Ran Out of Food in the Last Year: Never true  Transportation Needs: No Transportation Needs (10/21/2021)   PRAPARE - Administrator, Civil ServiceTransportation    Lack of Transportation (Medical): No    Lack of Transportation (Non-Medical): No  Physical Activity: Insufficiently Active (10/21/2021)   Exercise Vital Sign    Days of Exercise per Week: 7 days    Minutes of Exercise per Session: 20 min  Stress: No Stress Concern Present (10/21/2021)   [employer] - Occupational Stress Questionnaire    Feeling of Stress : Not at all  Social Connections: Moderately Integrated (10/08/2020)   Social Connection and Isolation Panel [NHANES]    Frequency of Communication with  Friends and Family: More than three times a week    Frequency of Social Gatherings with Friends and Family: More than three times a week    Attends Religious Services: 1 to 4 times per year    Active Member of [employer] or Organizations: Yes    Attends BankerClub or Organization Meetings: 1 to 4 times per year    Marital Status: Divorced    Tobacco Counseling Counseling given: Not Answered   Clinical Intake:  Pre-visit preparation completed: Yes  Pain : No/denies pain     Nutritional Status: BMI of 19-24  Normal Nutritional Risks: None Diabetes: Yes  How often do you need to have someone help you when you read instructions, pamphlets, or other written materials from your doctor or pharmacy?: 1 -  Never What is the last grade level you completed in school?: 31yrs college  Diabetic? Yes Nutrition Risk Assessment:  Has the patient had any N/V/D within the last 2 months?  No  Does the patient have any non-healing wounds?  No  Has the patient had any unintentional weight loss or weight gain?  No   Diabetes:  Is the patient diabetic?  Yes  If diabetic, was a CBG obtained today?  No  Did the patient bring in their glucometer from home?  No  How often do you monitor your CBG's? frequently.   Financial Strains and Diabetes Management:  Are you having any financial strains with the device, your supplies or your medication? No .  Does the patient want to be seen by Chronic Care Management for management of their diabetes?  No  Would the patient like to be referred to a Nutritionist or for Diabetic Management?  No   Diabetic Exams:  Diabetic Eye Exam: Completed 10/03/2021 Diabetic Foot Exam: Completed 01/28/2021   Interpreter Needed?: No  Information entered by :: NAllen Garrison   Activities of Daily Living    10/21/2021    3:37 PM  In your present state of health, do you have any difficulty performing the following activities:  Hearing? 0  Vision? 1  Comment some blurriness at times   Difficulty concentrating or making decisions? 0  Walking or climbing stairs? 0  Dressing or bathing? 0  Doing errands, shopping? 0  Preparing Food and eating ? N  Using the Toilet? N  In the past six months, have you accidently leaked urine? N  Do you have problems with loss of bowel control? N  Managing your Medications? N  Managing your Finances? N  Housekeeping or managing your Housekeeping? N    Patient Care Team: [name], NP as PCP - General (Family Medicine) Verner Chol, [facility] as Pharmacist (Pharmacist)  Indicate any recent Medical Services you may have received from other than [facility] providers in the past year (date may be approximate).     Assessment:   This is a routine wellness examination for Katrina Garrison.  Hearing/Vision screen Vision Screening - Comments:: Regular eye exams, Dr. Faylene Million  Dietary issues and exercise activities discussed: Current Exercise Habits: Home exercise routine, Time (Minutes): 30, Frequency (Times/Week): 5, Weekly Exercise (Minutes/Week): 150   Goals Addressed             This Visit's Progress    Patient Stated       10/21/2021, stay alive       Depression Screen    10/21/2021    3:36 PM 01/28/2021    9:53 AM 10/08/2020   11:43 AM 01/25/2020    1:51 PM 11/07/2019    2:19 PM 03/03/2017    1:17 PM 12/04/2015   12:15 PM  PHQ 2/9 Scores  PHQ - 2 Score 0 0 0 0 0 0 0  PHQ- 9 Score     0      Fall Risk    10/21/2021    3:36 PM 01/28/2021    9:52 AM 10/08/2020   11:46 AM 01/25/2020    1:51 PM 11/07/2019    2:18 PM  Fall Risk   Falls in the past year? 0 0 0 0 0  Number falls in past yr: 0 0 0 0 0  Injury with Fall? 0 0 0 0 0  Risk for fall due to : Medication side effect  Impaired vision  No Fall Risks  Follow up Falls prevention discussed;Education provided;Falls evaluation completed  Falls prevention discussed  Falls evaluation completed;Falls prevention discussed    FALL RISK PREVENTION PERTAINING TO THE HOME:  Any  stairs in or around the home? Yes  If so, are there any without handrails? No  Home free of loose throw rugs in walkways, pet beds, electrical cords, etc? Yes  Adequate lighting in your home to reduce risk of falls? Yes   ASSISTIVE DEVICES UTILIZED TO PREVENT FALLS:  Life alert? No  Use of a cane, walker or w/c? No  Grab bars in the bathroom? Yes  Shower chair or bench in shower? No  Elevated toilet seat or a handicapped toilet? Yes   TIMED UP AND GO:  Was the test performed? No .      Cognitive Function:        10/21/2021    3:39 PM 10/08/2020   11:49 AM 11/07/2019    2:21 PM  6CIT Screen  What Year? 0 points 0 points 0 points  What month? 0 points 0 points 0 points  What time? 0 points 0 points 0 points  Count back from 20 0 points 0 points 0 points  Months in reverse 0 points 0 points 0 points  Repeat phrase 0 points 0 points 2 points  Total Score 0 points 0 points 2 points    Immunizations Immunization History  Administered Date(s) Administered   Fluad Quad(high Dose 65+) 11/03/2019, 11/08/2020   Influenza Split 12/16/2010, 11/11/2011, 11/08/2012   Influenza Whole 01/26/2005, 12/06/2009   Influenza, High Dose Seasonal PF 10/03/2014, 12/04/2015, 11/19/2016, 10/25/2017   Influenza,inj,Quad PF,6+ Mos 11/23/2013   PFIZER(Purple Top)SARS-COV-2 Vaccination 02/17/2019, 03/10/2019   Pneumococcal Conjugate-13 10/06/2013, 06/10/2017, 06/11/2017   Pneumococcal Polysaccharide-23 01/27/2003, 12/04/2015   Td 01/27/2003, 10/06/2013   Tdap 06/10/2017   Zoster Recombinat (Shingrix) 06/10/2017, 03/25/2018    TDAP status: Up to date  Flu Vaccine status: Due, Education has been provided regarding the importance of this vaccine. Advised may receive this vaccine at local pharmacy or Health Dept. Aware to provide a copy of the vaccination record if obtained from local pharmacy or Health Dept. Verbalized acceptance and understanding.  Pneumococcal vaccine status: Up to  date  Covid-19 vaccine status: Completed vaccines  Qualifies for Shingles Vaccine? Yes   Zostavax completed Yes   Shingrix Completed?: Yes  Screening Tests Health Maintenance  Topic Date Due   COVID-19 Vaccine (3 - Pfizer series) 05/05/2019   Diabetic kidney evaluation - Urine ACR  01/24/2021   INFLUENZA VACCINE  08/26/2021   Diabetic kidney evaluation - GFR measurement  01/28/2022   FOOT EXAM  01/28/2022   HEMOGLOBIN A1C  03/05/2022   OPHTHALMOLOGY EXAM  10/04/2022   TETANUS/TDAP  06/11/2027   Pneumonia Vaccine 73+ Years old  Completed   DEXA SCAN  Completed   Zoster Vaccines- Shingrix  Completed   HPV VACCINES  Aged Out    Health Maintenance  Health Maintenance Due  Topic Date Due   COVID-19 Vaccine (3 - Pfizer series) 05/05/2019   Diabetic kidney evaluation - Urine ACR  01/24/2021   INFLUENZA VACCINE  08/26/2021    Colorectal cancer screening: No longer required.   Mammogram status: No longer required due to age.  Bone Density status: Completed 01/15/2015.   Lung Cancer Screening: (Low Dose CT Chest recommended if Age 88-80 years, 30 pack-year currently smoking OR have quit w/in 15years.) does not qualify.   Lung Cancer Screening Referral: no  Additional Screening:  Hepatitis C Screening:  does not qualify;  Vision Screening: Recommended annual ophthalmology exams for early detection of glaucoma and other disorders of the eye. Is the patient up to date with their annual eye exam?  Yes  Who is the provider or what is the name of the office in which the patient attends annual eye exams? Dr. Durene Fruits If pt is not established with a provider, would they like to be referred to a provider to establish care? No .   Dental Screening: Recommended annual dental exams for proper oral hygiene  Community Resource Referral / Chronic Care Management: CRR required this visit?  No   CCM required this visit?  No      Plan:     I have personally reviewed and noted the  following in the patient's chart:   Medical and social history Use of alcohol, tobacco or illicit drugs  Current medications and supplements including opioid prescriptions. Patient is not currently taking opioid prescriptions. Functional ability and status Nutritional status Physical activity Advanced directives List of other physicians Hospitalizations, surgeries, and ER visits in previous 12 months Vitals Screenings to include cognitive, depression, and falls Referrals and appointments  In addition, I have reviewed and discussed with patient certain preventive protocols, quality metrics, and best practice recommendations. A written personalized care plan for preventive services as well as general preventive health recommendations were provided to patient.     Kellie Simmering, Garrison   [id number]   Nurse Notes: none  Due to this being a virtual visit, the after visit summary with patients personalized plan was offered to patient via mail or my-chart. Patient would like to access on my-chart

## 2021-10-24 ENCOUNTER — Encounter (INDEPENDENT_AMBULATORY_CARE_PROVIDER_SITE_OTHER): Payer: Medicare HMO | Admitting: Ophthalmology

## 2021-10-24 DIAGNOSIS — E113313 Type 2 diabetes mellitus with moderate nonproliferative diabetic retinopathy with macular edema, bilateral: Secondary | ICD-10-CM | POA: Diagnosis not present

## 2021-10-24 DIAGNOSIS — I10 Essential (primary) hypertension: Secondary | ICD-10-CM | POA: Diagnosis not present

## 2021-10-24 DIAGNOSIS — H59033 Cystoid macular edema following cataract surgery, bilateral: Secondary | ICD-10-CM | POA: Diagnosis not present

## 2021-10-24 DIAGNOSIS — H35033 Hypertensive retinopathy, bilateral: Secondary | ICD-10-CM | POA: Diagnosis not present

## 2021-10-24 DIAGNOSIS — H43813 Vitreous degeneration, bilateral: Secondary | ICD-10-CM

## 2021-10-25 DIAGNOSIS — E1159 Type 2 diabetes mellitus with other circulatory complications: Secondary | ICD-10-CM | POA: Diagnosis not present

## 2021-10-25 DIAGNOSIS — E785 Hyperlipidemia, unspecified: Secondary | ICD-10-CM

## 2021-10-25 DIAGNOSIS — Z794 Long term (current) use of insulin: Secondary | ICD-10-CM

## 2021-10-25 DIAGNOSIS — M8580 Other specified disorders of bone density and structure, unspecified site: Secondary | ICD-10-CM

## 2021-10-25 DIAGNOSIS — I10 Essential (primary) hypertension: Secondary | ICD-10-CM | POA: Diagnosis not present

## 2021-10-28 ENCOUNTER — Telehealth: Payer: Self-pay | Admitting: Pharmacist

## 2021-10-28 NOTE — Chronic Care Management (AMB) (Signed)
    Chronic Care Management Pharmacy Assistant   Name: MIKAYLAH LIBBEY  MRN: [id number] DOB: 1940/09/22  Reason for Encounter: Follow up Ozempic and blood pressures  Spoke with patient, she states she is tolerating Ozempic well and is no longer having any issues with her BM's.  Patient states she has attempted to set up dexcom to connect with you, she states she has attempted to send some readings to you. If you have not received any, her daughter is coming over this week and they plan to make sure her dexcom is connecting and will send more blood sugar readings at that time.  Patient states she has not been checking her blood pressures recently. She had a visit with her eye doctor and he sent her to a specialist.(she is unsure of what the diagnosis is) She will be getting injections into both eyes monthly for the next two years to save her vision.  She chose not to check blood pressures recently due to the stress of facing this.  She has had her first injections and plans to start checking her blood pressures.  I will check back with her in about a week at her [facility] monthly medication call.    [facility] Pharmacist Assistant [phone]

## 2021-10-31 DIAGNOSIS — E1165 Type 2 diabetes mellitus with hyperglycemia: Secondary | ICD-10-CM | POA: Diagnosis not present

## 2021-11-06 ENCOUNTER — Telehealth: Payer: Self-pay | Admitting: Pharmacist

## 2021-11-06 NOTE — Chronic Care Management (AMB) (Signed)
Chronic Care Management Pharmacy Assistant   Name: Katrina Garrison  MRN: [id number] DOB: 02/10/1940  Reason for Encounter: Medication Review / Medication Coordination Call   Recent office visits:  10/21/2021 Elisha Ponder LPN - Medicare annual wellness exam  Recent consult visits:  None  Hospital visits:  None  Medications: Outpatient Encounter Medications as of 11/06/2021  Medication Sig   amLODipine (NORVASC) 10 MG tablet Take 1 tablet (10 mg total) by mouth daily.   aspirin 81 MG tablet Take 81 mg by mouth as needed.   fluconazole (DIFLUCAN) 150 MG tablet Take 1 tablet (150 mg total) by mouth daily. (Patient not taking: Reported on 10/21/2021)   glipiZIDE (GLUCOTROL XL) 5 MG 24 hr tablet Take 1 tablet (5 mg total) by mouth every evening.   glucose blood test strip Use as instructed   ibuprofen (ADVIL,MOTRIN) 200 MG tablet Take 200 mg by mouth every 6 (six) hours as needed. Reported on 03/22/2015   insulin detemir (LEVEMIR FLEXTOUCH) 100 UNIT/ML FlexPen INJECT 35 UNITS SUBCUTANEOUSLY AT BEDTIME (Patient taking differently: 20 Units 2 (two) times daily.)   Insulin Pen Needle (RELION PEN NEEDLES) 31G X 6 MM MISC Use daily   Multiple Vitamins-Minerals (MULTIPLE VITAMINS/WOMENS PO) Take by mouth.   olmesartan (BENICAR) 40 MG tablet Take 1 tablet (40 mg total) by mouth daily.   Semaglutide,0.25 or 0.5MG /DOS, 2 MG/3ML SOPN Inject 0.25 mg into the skin once a week.   spironolactone (ALDACTONE) 25 MG tablet Take 1 tablet (25 mg total) by mouth daily.   No facility-administered encounter medications on file as of 11/06/2021.   Reviewed chart for medication changes ahead of medication coordination call.  No OVs, Consults, or hospital visits since last care coordination call/Pharmacist visit. (If appropriate, list visit date, provider name)  No medication changes indicated OR if recent visit, treatment plan here.  BP Readings from Last 3 Encounters:  09/02/21 (!) 160/80   04/29/21 140/72  01/28/21 (!) 200/100    Lab Results  Component Value Date   HGBA1C 6.9 (A) 09/02/2021     Patient obtains medications through Adherence Packaging  30 Days    Last adherence delivery included:  Glipizide ER 5 mg - take 1 tablet with dinner Spironolactone 25 mg - take 1 tablet at breakfast Olmesartan 40 mg  - take 1 tablet with dinner Amlodipine 10 mg  - take 1 tablet at breakfast Pen needles - use as directed    Patient declined the following medications: She has plenty on hand Levemir Flextouch 100 un/ml - 20 units twice daily (has plenty) Ozempic - not due until 11/07/21  Accuchek guide test strips - using PRN Vitafusion - chew 1 gummy every morning (vial   Patient is due for next adherence delivery on: 11/18/2021   Called patient and reviewed medications and coordinated delivery.   This delivery to include: Glipizide ER 5 mg - take 1 tablet with dinner Spironolactone 25 mg - take 1 tablet at breakfast Olmesartan 40 mg  - take 1 tablet with dinner Amlodipine 10 mg  - take 1 tablet at breakfast Ozempic - Inject 0.25 mg into the skin once a week   Patient will need a short fill: No short fills needed    Coordinated acute fill: No acute fills needed    Patient declined the following medications: She has plenty on hand Levemir Flextouch 100 un/ml - 20 units twice daily (has plenty) Vitafusion - chew 1 gummy every morning   Pen needles -  use as directed  Confirmed delivery date of 11/18/2021, advised patient that pharmacy will contact them the morning of delivery.  Notes:  Patients recent blood pressure readings 11/04/21 - 155/83 11/03/21 - 148/80 11/02/21 - 150/81 11/01/21 - 153/86 Patient has not gotten dexcom set up, she has tried to send readings. She is heading to her daughters today and they plan to try and set it up correctly.   Care Gaps: AWV - scheduled on 10/26/2022 Last BP - 160/80 on 09/02/2021 Last A1C - 6.9 on 09/02/2021 Covid -  overdue Urine ACR - overdue Flu - due  Star Rating Drugs: Glipizide 5 mg - last filled 10/13/2021 30 DS at [facility] Olmesartan 40 mg - last filled 10/13/2021 30 DS at [facility] Semaglutide 0.25 mg - last filled 09/12/2021 56 DS at [place] [phone]

## 2021-11-21 ENCOUNTER — Encounter (INDEPENDENT_AMBULATORY_CARE_PROVIDER_SITE_OTHER): Payer: Medicare HMO | Admitting: Ophthalmology

## 2021-11-21 DIAGNOSIS — E113313 Type 2 diabetes mellitus with moderate nonproliferative diabetic retinopathy with macular edema, bilateral: Secondary | ICD-10-CM

## 2021-11-21 DIAGNOSIS — I10 Essential (primary) hypertension: Secondary | ICD-10-CM | POA: Diagnosis not present

## 2021-11-21 DIAGNOSIS — H43813 Vitreous degeneration, bilateral: Secondary | ICD-10-CM

## 2021-11-21 DIAGNOSIS — H35033 Hypertensive retinopathy, bilateral: Secondary | ICD-10-CM | POA: Diagnosis not present

## 2021-12-08 ENCOUNTER — Telehealth: Payer: Self-pay | Admitting: Pharmacist

## 2021-12-08 NOTE — Chronic Care Management (AMB) (Unsigned)
Chronic Care Management Pharmacy Assistant   Name: Katrina Garrison  MRN: [id number] DOB: 04-Nov-1940  Reason for Encounter: Medication Review / Medication Coordination Call   Recent office visits:  None  Recent consult visits:  None  Hospital visits:  None  Medications: Outpatient Encounter Medications as of 12/08/2021  Medication Sig   amLODipine (NORVASC) 10 MG tablet Take 1 tablet (10 mg total) by mouth daily.   aspirin 81 MG tablet Take 81 mg by mouth as needed.   fluconazole (DIFLUCAN) 150 MG tablet Take 1 tablet (150 mg total) by mouth daily. (Patient not taking: Reported on 10/21/2021)   glipiZIDE (GLUCOTROL XL) 5 MG 24 hr tablet Take 1 tablet (5 mg total) by mouth every evening.   glucose blood test strip Use as instructed   ibuprofen (ADVIL,MOTRIN) 200 MG tablet Take 200 mg by mouth every 6 (six) hours as needed. Reported on 03/22/2015   insulin detemir (LEVEMIR FLEXTOUCH) 100 UNIT/ML FlexPen INJECT 35 UNITS SUBCUTANEOUSLY AT BEDTIME (Patient taking differently: 20 Units 2 (two) times daily.)   Insulin Pen Needle (RELION PEN NEEDLES) 31G X 6 MM MISC Use daily   Multiple Vitamins-Minerals (MULTIPLE VITAMINS/WOMENS PO) Take by mouth.   olmesartan (BENICAR) 40 MG tablet Take 1 tablet (40 mg total) by mouth daily.   spironolactone (ALDACTONE) 25 MG tablet Take 1 tablet (25 mg total) by mouth daily.   No facility-administered encounter medications on file as of 12/08/2021.   Reviewed chart for medication changes ahead of medication coordination call.  BP Readings from Last 3 Encounters:  09/02/21 (!) 160/80  04/29/21 140/72  01/28/21 (!) 200/100    Lab Results  Component Value Date   HGBA1C 6.9 (A) 09/02/2021     Patient obtains medications through Adherence Packaging  30 Days    Last adherence delivery included:  Glipizide ER 5 mg - take 1 tablet with dinner Spironolactone 25 mg - take 1 tablet at breakfast Olmesartan 40 mg  - take 1 tablet with  dinner Amlodipine 10 mg  - take 1 tablet at breakfast Ozempic - Inject 0.25 mg into the skin once a week    Patient declined the following medications: She has plenty on hand Levemir Flextouch 100 un/ml - 20 units twice daily (has plenty) Vitafusion - chew 1 gummy every morning   Pen needles - use as directed   Patient is due for next adherence delivery on: 12/17/2021   Called patient and reviewed medications and coordinated delivery.   This delivery to include: Glipizide ER 5 mg - take 1 tablet with dinner Spironolactone 25 mg - take 1 tablet at breakfast Olmesartan 40 mg  - take 1 tablet with dinner Amlodipine 10 mg  - take 1 tablet at breakfast Levemir Flextouch 100 un/ml - 20 units AM 10 units PM  Patient will need a short fill: No short fills needed    Coordinated acute fill: No acute fills needed    Patient declined the following medications: She has plenty on hand Vitafusion - chew 1 gummy every morning   Pen needles - use as directed Ozempic - (not due til Dec)    Confirmed delivery date of 12/17/2021, advised patient that pharmacy will contact them the morning of delivery.  Care Gaps: AWV - scheduled on 10/26/2022 Last BP - 160/80 on 09/02/2021 Last A1C - 6.9 on 09/02/2021 Covid - overdue Urine ACR - overdue Flu - due  Star Rating Drugs: Glipizide 5 mg - last filled 11/13/2021 30 DS  at [facility] Olmesartan 40 mg - last filled 11/13/2021 30 DS at [facility] Semaglutide 0.25 mg - last filled 10/19/202356 DS at [facility]  Inetta Fermo CMA  Clinical Pharmacist Assistant [phone]

## 2021-12-09 ENCOUNTER — Encounter: Payer: Self-pay | Admitting: Adult Health

## 2021-12-09 ENCOUNTER — Ambulatory Visit (INDEPENDENT_AMBULATORY_CARE_PROVIDER_SITE_OTHER): Payer: Medicare HMO | Admitting: Adult Health

## 2021-12-09 VITALS — BP 130/80 | Temp 99.1°F | Ht 62.0 in | Wt 128.0 lb

## 2021-12-09 DIAGNOSIS — I10 Essential (primary) hypertension: Secondary | ICD-10-CM | POA: Diagnosis not present

## 2021-12-09 DIAGNOSIS — E118 Type 2 diabetes mellitus with unspecified complications: Secondary | ICD-10-CM | POA: Diagnosis not present

## 2021-12-09 DIAGNOSIS — R6889 Other general symptoms and signs: Secondary | ICD-10-CM | POA: Diagnosis not present

## 2021-12-09 LAB — MICROALBUMIN / CREATININE URINE RATIO
Creatinine,U: 122.4 mg/dL
Microalb Creat Ratio: 74.3 mg/g — ABNORMAL HIGH (ref 0.0–30.0)
Microalb, Ur: 91 mg/dL — ABNORMAL HIGH (ref 0.0–1.9)

## 2021-12-09 LAB — POCT GLYCOSYLATED HEMOGLOBIN (HGB A1C): Hemoglobin A1C: 6.9 % — AB (ref 4.0–5.6)

## 2021-12-09 MED ORDER — SEMAGLUTIDE(0.25 OR 0.5MG/DOS) 2 MG/3ML ~~LOC~~ SOPN: 0.2500 mg | PEN_INJECTOR | SUBCUTANEOUS | 0 refills | Status: DC

## 2021-12-09 MED ORDER — LEVEMIR FLEXTOUCH 100 UNIT/ML ~~LOC~~ SOPN: PEN_INJECTOR | SUBCUTANEOUS | 0 refills | Status: DC

## 2021-12-09 NOTE — Patient Instructions (Addendum)
Your A1c was 6.7!   Your blood pressure was great today as well!  I will see you back in 3 months

## 2021-12-09 NOTE — Progress Notes (Signed)
Subjective:    Patient ID: Katrina Garrison, female    DOB: 20-Aug-1940, 81 y.o.   MRN: [id number]  HPI She presents to the office today for follow up regarding HTN and DM Type 2   DM type 2 -she is currently managed with Levemir 20 units in the morning and 16 units in the evening.  ( she is taking 20 mg in the morning daily and will only take her 16 units at night if she is eating dinner, reports her blood sugars drop to low if she takes her night time insulin without eating), glipizide 5 mg extended release in the evening, and Ozempic 0.25 mg weekly.  She is using a continuous glucose monitor to check her blood sugars.  She is tolerating Ozempic well at this time  Lab Results  Component Value Date   HGBA1C 6.9 (A) 12/09/2021    She has previously tried Novolin R, [place], [place], [place], Tresiba, and metformin and reports side effects with all of these medications.  CONTINUOUS GLUCOSE MONITORING RECORD INTERPRETATION                 Dates of Recording:    Sensor description: Dexcom   Results statistics:   CGM use % of time      Time in range 74%  % Time Above 180 23%  % Time above 250 2%  % Time Below target 1%       HTN -managed with Norvasc 10 mg in the morning, olmesartan 40 mg in the evening, and spironolactone 25 mg in the evening. BP Readings from Last 3 Encounters:  12/09/21 130/80  09/02/21 (!) 160/80  04/29/21 140/72     Review of Systems  All other systems reviewed and are negative.  See HPI   Past Medical History:  Diagnosis Date   Anemia    Diabetes mellitus type II 2005   Diverticulosis    GERD (gastroesophageal reflux disease)    History of lithotripsy    x3 for kidney stones   Hypertension    Kidney stones     Social History   Socioeconomic History   Marital status: Divorced    Spouse name: Not on file   Number of children: Not on file   Years of education: Not on file   Highest education level: Not on file  Occupational  History   Occupation: Retired  Tobacco Use   Smoking status: Former    Years: 1.50    Types: Cigarettes    Quit date: 01/28/1985    Years since quitting: 36.8   Smokeless tobacco: Never  Vaping Use   Vaping Use: Never used  Substance and Sexual Activity   Alcohol use: No   Drug use: No   Sexual activity: Not on file  Other Topics Concern   Not on file  Social History Narrative   Not on file   Social Determinants of Health   Financial Resource Strain: Low Risk  (10/21/2021)   Overall Financial Resource Strain (CARDIA)    Difficulty of Paying Living Expenses: Not hard at all  Food Insecurity: No Food Insecurity (10/21/2021)   Hunger Vital Sign    Worried About Running Out of Food in the Last Year: Never true    Ran Out of Food in the Last Year: Never true  Transportation Needs: No Transportation Needs (10/21/2021)   PRAPARE - Administrator, Civil Service (Medical): No    Lack of Transportation (Non-Medical): No  Physical Activity:  Insufficiently Active (10/21/2021)   Exercise Vital Sign    Days of Exercise per Week: 7 days    Minutes of Exercise per Session: 20 min  Stress: No Stress Concern Present (10/21/2021)   [employer] of Occupational Health - Occupational Stress Questionnaire    Feeling of Stress : Not at all  Social Connections: Moderately Integrated (10/08/2020)   Social Connection and Isolation Panel [NHANES]    Frequency of Communication with Friends and Family: More than three times a week    Frequency of Social Gatherings with Friends and Family: More than three times a week    Attends Religious Services: 1 to 4 times per year    Active Member of [employer] or Organizations: Yes    Attends Banker Meetings: 1 to 4 times per year    Marital Status: Divorced  Catering manager Violence: Not At Risk (10/08/2020)   Humiliation, Afraid, Rape, and Kick questionnaire    Fear of Current or Ex-Partner: No    Emotionally Abused: No    Physically  Abused: No    Sexually Abused: No    Past Surgical History:  Procedure Laterality Date   ABDOMINAL HYSTERECTOMY     childbirth x 2     EYE SURGERY     cataract    Family History  Problem Relation Age of Onset   Thyroid cancer Mother    Diabetes Mother    Hypertension Mother    Alcohol abuse Father    Hypertension Father    Multiple sclerosis Daughter    Multiple sclerosis Daughter    Multiple sclerosis Sister     Allergies  Allergen Reactions   Hydrochlorothiazide    Jardiance [Empagliflozin]     Yeast infections    Lisinopril Cough   Losartan Cough   Metformin And Related Diarrhea   Metoprolol Swelling    Leg cramping    Novolin R [Insulin] Itching   Sulfamethoxazole     REACTION: "comatose"   Toujeo Solostar [Insulin Glargine] Rash    Current Outpatient Medications on File Prior to Visit  Medication Sig Dispense Refill   amLODipine (NORVASC) 10 MG tablet Take 1 tablet (10 mg total) by mouth daily. 90 tablet 3   aspirin 81 MG tablet Take 81 mg by mouth as needed.     glipiZIDE (GLUCOTROL XL) 5 MG 24 hr tablet Take 1 tablet (5 mg total) by mouth every evening. 90 tablet 0   ibuprofen (ADVIL,MOTRIN) 200 MG tablet Take 200 mg by mouth every 6 (six) hours as needed. Reported on 03/22/2015     insulin detemir (LEVEMIR FLEXTOUCH) 100 UNIT/ML FlexPen INJECT 35 UNITS SUBCUTANEOUSLY AT BEDTIME (Patient taking differently: 20 Units 2 (two) times daily.) 30 mL 0   Insulin Pen Needle (RELION PEN NEEDLES) 31G X 6 MM MISC Use daily 90 each 3   Multiple Vitamins-Minerals (MULTIPLE VITAMINS/WOMENS PO) Take by mouth.     olmesartan (BENICAR) 40 MG tablet Take 40 mg by mouth daily.     spironolactone (ALDACTONE) 25 MG tablet Take 1 tablet (25 mg total) by mouth daily. 90 tablet 3   No current facility-administered medications on file prior to visit.    BP 130/80   Temp 99.1 F (37.3 C) (Oral)   Ht 5\' 2"  (1.575 m)   Wt 128 lb (58.1 kg)   BMI 23.41 kg/m       Objective:    Physical Exam Vitals and nursing note reviewed.  Constitutional:      Appearance:  Normal appearance.  Cardiovascular:     Rate and Rhythm: Normal rate and regular rhythm.     Pulses: Normal pulses.     Heart sounds: Normal heart sounds.  Pulmonary:     Effort: Pulmonary effort is normal.     Breath sounds: Normal breath sounds.  Musculoskeletal:        General: Normal range of motion.  Skin:    General: Skin is warm and dry.     Capillary Refill: Capillary refill takes less than 2 seconds.  Neurological:     General: No focal deficit present.     Mental Status: She is alert and oriented to person, place, and time.  Psychiatric:        Mood and Affect: Mood normal.        Behavior: Behavior normal.        Thought Content: Thought content normal.       Assessment & Plan:   1. DM type 2, controlled, with complication (HCC)  - POC HgB A1c- 6.7 - Will have her take 20 units of Levemir in the morning and 10 units every evening.  - Microalbumin/Creatinine Ratio, Urine - Semaglutide,0.25 or 0.5MG /DOS, 2 MG/3ML SOPN; Inject 0.25 mg into the skin once a week.  Dispense: 3 mL; Refill: 0 - insulin detemir (LEVEMIR FLEXTOUCH) 100 UNIT/ML FlexPen; Inject 20 units in the morning and 10 units in the evening  Dispense: 27 mL; Refill: 0 - Follow up in 3 months  2. Essential hypertension - Well controlled. No change in medications   [name], NP

## 2021-12-12 ENCOUNTER — Other Ambulatory Visit: Payer: Self-pay | Admitting: Adult Health

## 2021-12-12 DIAGNOSIS — E1165 Type 2 diabetes mellitus with hyperglycemia: Secondary | ICD-10-CM

## 2021-12-23 ENCOUNTER — Encounter (INDEPENDENT_AMBULATORY_CARE_PROVIDER_SITE_OTHER): Payer: Medicare HMO | Admitting: Ophthalmology

## 2021-12-23 DIAGNOSIS — H43813 Vitreous degeneration, bilateral: Secondary | ICD-10-CM | POA: Diagnosis not present

## 2021-12-23 DIAGNOSIS — H35033 Hypertensive retinopathy, bilateral: Secondary | ICD-10-CM | POA: Diagnosis not present

## 2021-12-23 DIAGNOSIS — E113313 Type 2 diabetes mellitus with moderate nonproliferative diabetic retinopathy with macular edema, bilateral: Secondary | ICD-10-CM

## 2021-12-23 DIAGNOSIS — I10 Essential (primary) hypertension: Secondary | ICD-10-CM | POA: Diagnosis not present

## 2022-01-02 ENCOUNTER — Telehealth: Payer: Self-pay | Admitting: Pharmacist

## 2022-01-02 NOTE — Chronic Care Management (AMB) (Signed)
Chronic Care Management Pharmacy Assistant   Name: QUINNLEY COLASURDO  MRN: [id number] DOB: [birth year]  Reason for Encounter: Medication Review / Medication Coordination Call   Recent office visits:  12/09/2021 [name] NP - Patient was seen for DM type 2, controlled, with complication and essential hypertension. Started Ozempic 0.25 mg weekly. Decreased Levemir Flexpen 100 un/ml inject 20 units in moring 10 units in evening. Discontinued Fluconazole. Follow up in 3 months.   Recent consult visits:  None  Hospital visits:  None  Medications: Outpatient Encounter Medications as of 01/02/2022  Medication Sig   amLODipine (NORVASC) 10 MG tablet Take 1 tablet (10 mg total) by mouth daily.   aspirin 81 MG tablet Take 81 mg by mouth as needed.   glipiZIDE (GLUCOTROL XL) 5 MG 24 hr tablet TAKE ONE TABLET BY MOUTH EVERY EVENING   ibuprofen (ADVIL,MOTRIN) 200 MG tablet Take 200 mg by mouth every 6 (six) hours as needed. Reported on 03/22/2015   insulin detemir (LEVEMIR FLEXTOUCH) 100 UNIT/ML FlexPen Inject 20 units in the morning and 10 units in the evening   Insulin Pen Needle (RELION PEN NEEDLES) 31G X 6 MM MISC Use daily   Multiple Vitamins-Minerals (MULTIPLE VITAMINS/WOMENS PO) Take by mouth.   olmesartan (BENICAR) 40 MG tablet Take 40 mg by mouth daily.   Semaglutide,0.25 or 0.5MG /DOS, 2 MG/3ML SOPN Inject 0.25 mg into the skin once a week.   spironolactone (ALDACTONE) 25 MG tablet Take 1 tablet (25 mg total) by mouth daily.   No facility-administered encounter medications on file as of 01/02/2022.   Reviewed chart for medication changes ahead of medication coordination call.  No OVs, Consults, or hospital visits since last care coordination call/Pharmacist visit. (If appropriate, list visit date, provider name)  No medication changes indicated OR if recent visit, treatment plan here.  BP Readings from Last 3 Encounters:  12/09/21 130/80  09/02/21 (!) 160/80  04/29/21  140/72    Lab Results  Component Value Date   HGBA1C 6.9 (A) 12/09/2021     Patient obtains medications through Adherence Packaging  30 Days    Last adherence delivery included:  Glipizide ER 5 mg - take 1 tablet with dinner Spironolactone 25 mg - take 1 tablet at breakfast Olmesartan 40 mg  - take 1 tablet with dinner Amlodipine 10 mg  - take 1 tablet at breakfast Ozempic - Inject 0.25 mg into the skin once a week    Patient declined the following medications: She has plenty on hand Levemir Flextouch 100 un/ml - 20 units twice daily (has plenty) Vitafusion - chew 1 gummy every morning   Pen needles - use as directed   Patient is due for next adherence delivery on: 01/15/2022   Called patient and reviewed medications and coordinated delivery.   This delivery to include: Glipizide ER 5 mg - take 1 tablet with dinner Spironolactone 25 mg - take 1 tablet at breakfast Olmesartan 40 mg  - take 1 tablet with dinner Amlodipine 10 mg  - take 1 tablet at breakfast  Pen needles - use as directed Ozempic - Inject 0.25 mg into the skin once a week   Patient will need a short fill: No short fills needed    Coordinated acute fill: No acute fills needed    Patient declined the following medications: She has plenty on hand Vitafusion - chew 1 gummy every morning   Levemir Flextouch 100 un/ml - 20 units twice daily   Confirmed delivery date  of 01/15/2022, advised patient that pharmacy will contact them the morning of delivery.   Care Gaps: AWV - scheduled on 10/26/2022 Last BP - 130/80 on 12/09/2021 Last A1C - 6.9 on 12/09/2021 Flu - overdue Covid - overdue GFR - due soon  Star Rating Drugs: Glipizide 5 mg - last filled 12/15/2021 30 DS at [facility] Olmesartan 40 mg - last filled 12/15/2021 30 DS at [facility] Semaglutide 0.25 mg - last filled 11/13/2021 56 DS at [facility]  Inetta Fermo [facility]  Clinical Pharmacist Assistant [phone]

## 2022-01-11 ENCOUNTER — Other Ambulatory Visit: Payer: Self-pay | Admitting: Adult Health

## 2022-01-27 ENCOUNTER — Encounter (INDEPENDENT_AMBULATORY_CARE_PROVIDER_SITE_OTHER): Payer: Medicare HMO | Admitting: Ophthalmology

## 2022-01-27 DIAGNOSIS — E113313 Type 2 diabetes mellitus with moderate nonproliferative diabetic retinopathy with macular edema, bilateral: Secondary | ICD-10-CM | POA: Diagnosis not present

## 2022-01-27 DIAGNOSIS — H35033 Hypertensive retinopathy, bilateral: Secondary | ICD-10-CM | POA: Diagnosis not present

## 2022-01-27 DIAGNOSIS — H43813 Vitreous degeneration, bilateral: Secondary | ICD-10-CM | POA: Diagnosis not present

## 2022-01-27 DIAGNOSIS — I10 Essential (primary) hypertension: Secondary | ICD-10-CM

## 2022-01-30 DIAGNOSIS — E1165 Type 2 diabetes mellitus with hyperglycemia: Secondary | ICD-10-CM | POA: Diagnosis not present

## 2022-02-04 ENCOUNTER — Telehealth: Payer: Self-pay | Admitting: Pharmacist

## 2022-02-04 NOTE — Progress Notes (Signed)
Care Management & Coordination Services Pharmacy Team  Name: Katrina Garrison  MRN: [id number] DOB: [birth year]  Reason for Encounter: Medication coordination and delivery  Contacted patient on 02/04/2022 to discuss medications   Recent office visits:  None  Recent consult visits:  None  Hospital visits:  None  Medications: Outpatient Encounter Medications as of 02/04/2022  Medication Sig   amLODipine (NORVASC) 10 MG tablet Take 1 tablet (10 mg total) by mouth daily.   aspirin 81 MG tablet Take 81 mg by mouth as needed.   COMFORT EZ PEN NEEDLES 31G X 6 MM MISC USE ONCE daily AS DIRECTED   glipiZIDE (GLUCOTROL XL) 5 MG 24 hr tablet TAKE ONE TABLET BY MOUTH EVERY EVENING   ibuprofen (ADVIL,MOTRIN) 200 MG tablet Take 200 mg by mouth every 6 (six) hours as needed. Reported on 03/22/2015   insulin detemir (LEVEMIR FLEXTOUCH) 100 UNIT/ML FlexPen Inject 20 units in the morning and 10 units in the evening   Multiple Vitamins-Minerals (MULTIPLE VITAMINS/WOMENS PO) Take by mouth.   olmesartan (BENICAR) 40 MG tablet Take 40 mg by mouth daily.   Semaglutide,0.25 or 0.5MG /DOS, 2 MG/3ML SOPN Inject 0.25 mg into the skin once a week.   spironolactone (ALDACTONE) 25 MG tablet Take 1 tablet (25 mg total) by mouth daily.   No facility-administered encounter medications on file as of 02/04/2022.   BP Readings from Last 3 Encounters:  12/09/21 130/80  09/02/21 (!) 160/80  04/29/21 140/72    Pulse Readings from Last 3 Encounters:  09/02/21 72  04/29/21 91  01/28/21 (!) 106    Lab Results  Component Value Date/Time   HGBA1C 6.9 (A) 12/09/2021 10:33 AM   HGBA1C 6.9 (A) 09/02/2021 09:58 AM   HGBA1C 8.6 (A) 04/29/2021 09:42 AM   HGBA1C 10.5 (H) 01/28/2021 10:25 AM   HGBA1C 10.0 (H) 04/24/2020 10:17 AM   Lab Results  Component Value Date   CREATININE 0.97 01/28/2021   BUN 19 01/28/2021   GFR 55.43 (L) 01/28/2021   GFRNONAA 51 (L) 11/03/2019   GFRAA 59 (L) 11/03/2019   NA 138 01/28/2021    K 3.9 01/28/2021   CALCIUM 9.4 01/28/2021   CO2 26 01/28/2021     Last adherence delivery date:01/15/2022      Patient is due for next adherence delivery on: 02/16/2022  Spoke with patient on 02/04/2022 reviewed medications and coordinated delivery.  This delivery to include: Adherence Packaging  30 Days  Glipizide ER 5 mg - take 1 tablet with dinner Spironolactone 25 mg - take 1 tablet at breakfast Olmesartan 40 mg  - take 1 tablet with dinner Amlodipine 10 mg  - take 1 tablet at breakfast  Ozempic - Inject 0.25 mg into the skin once a week  Patient declined the following medications this month: Vitafusion - chew 1 gummy every morning   Pen needles - use as directed Levemir Flextouch 100 un/ml - 20 units in the AM, 10 units in the PM  Confirmed delivery date of 02/16/2022, advised patient that pharmacy will contact them the morning of delivery.  Cycle dispensing form sent to Jeni Salles for review.  Care Gaps: AWV - scheduled on 10/26/2022 Last BP - 130/80 on 12/09/2021 Last A1C - 6.9 on 12/09/2021 Flu - overdue Covid - overdue eGFR - overdue Foot exam - overdue  Star Rating Drugs: Glipizide 5 mg - last filled 01/12/2022 30 DS at [facility] Olmesartan 40 mg - last filled 01/12/2022 30 DS at [facility] Semaglutide 0.25 mg - last  filled 01/12/2022 30 DS at [place] [phone]

## 2022-02-09 ENCOUNTER — Other Ambulatory Visit: Payer: Self-pay | Admitting: Adult Health

## 2022-02-09 DIAGNOSIS — E118 Type 2 diabetes mellitus with unspecified complications: Secondary | ICD-10-CM

## 2022-02-10 NOTE — Telephone Encounter (Signed)
-----  Message from Pearson Forster sent at 02/10/2022  2:08 PM EST ----- Regarding: Refill needed [facility] is needing a refill of Ozempic sent to them for this patient.  Thanks

## 2022-02-23 ENCOUNTER — Encounter (INDEPENDENT_AMBULATORY_CARE_PROVIDER_SITE_OTHER): Payer: Medicare HMO | Admitting: Ophthalmology

## 2022-02-24 ENCOUNTER — Encounter (INDEPENDENT_AMBULATORY_CARE_PROVIDER_SITE_OTHER): Payer: Medicare HMO | Admitting: Ophthalmology

## 2022-02-24 DIAGNOSIS — H43813 Vitreous degeneration, bilateral: Secondary | ICD-10-CM

## 2022-02-24 DIAGNOSIS — I10 Essential (primary) hypertension: Secondary | ICD-10-CM

## 2022-02-24 DIAGNOSIS — E113313 Type 2 diabetes mellitus with moderate nonproliferative diabetic retinopathy with macular edema, bilateral: Secondary | ICD-10-CM

## 2022-02-24 DIAGNOSIS — H35033 Hypertensive retinopathy, bilateral: Secondary | ICD-10-CM | POA: Diagnosis not present

## 2022-03-04 ENCOUNTER — Telehealth: Payer: Self-pay

## 2022-03-04 NOTE — Progress Notes (Signed)
Care Management & Coordination Services Pharmacy Team  Reason for Encounter: Medication coordination and delivery  Contacted patient to discuss medications and coordinate delivery from [facility]. Spoke with patient on 03/04/2022   Cycle dispensing form sent to [facility] for review.   Last adherence delivery date:02/16/2022      Patient is due for next adherence delivery on: 03/16/2022  This delivery to include: Adherence Packaging  30 Days  Spironolactone 25 mg - take 1 tablet at breakfast Olmesartan 40 mg  - take 1 tablet with dinner Amlodipine 10 mg  - take 1 tablet at breakfast   Patient declined the following medications this month: Patient has plenty on hand  Vitafusion   Pen needles - use as directed Levemir Flextouch 100 un/ml   Holding until after her visit with PCP on 03/17/22, not tolerating medications Ozempic - Inject 0.25 mg  Glipizide ER 5 mg   Confirmed delivery date of 03/16/2022, advised patient that pharmacy will contact them the morning of delivery.  Any concerns about your medications? Patient is not tolerating DM medications, she has an appointment with pcp to discuss this on 03/17/2022.  How often do you forget or accidentally miss a dose? Patient denies  Is patient in packaging Yes  What is the date on your next pill pack? 03/04/2022  Any concerns or issues with your packaging? Patient denies  Recent blood pressure readings are as follows: declined today  Recent blood glucose readings are as follows: declined today  Chart review:  Recent office visits:  None  Recent consult visits:  None  Hospital visits:  None  Medications: Outpatient Encounter Medications as of 03/04/2022  Medication Sig   amLODipine (NORVASC) 10 MG tablet Take 1 tablet (10 mg total) by mouth daily.   aspirin 81 MG tablet Take 81 mg by mouth as needed.   COMFORT EZ PEN NEEDLES 31G X 6 MM MISC USE ONCE daily AS DIRECTED   glipiZIDE (GLUCOTROL XL) 5 MG 24 hr  tablet TAKE ONE TABLET BY MOUTH EVERY EVENING   ibuprofen (ADVIL,MOTRIN) 200 MG tablet Take 200 mg by mouth every 6 (six) hours as needed. Reported on 03/22/2015   insulin detemir (LEVEMIR FLEXTOUCH) 100 UNIT/ML FlexPen Inject 20 units in the morning and 10 units in the evening   Multiple Vitamins-Minerals (MULTIPLE VITAMINS/WOMENS PO) Take by mouth.   olmesartan (BENICAR) 40 MG tablet Take 40 mg by mouth daily.   Semaglutide,0.25 or 0.5MG /DOS, (OZEMPIC, 0.25 OR 0.5 MG/DOSE,) 2 MG/3ML SOPN INJECT 0.25mg  into THE SKIN ONCE A WEEK   spironolactone (ALDACTONE) 25 MG tablet Take 1 tablet (25 mg total) by mouth daily.   No facility-administered encounter medications on file as of 03/04/2022.   BP Readings from Last 3 Encounters:  12/09/21 130/80  09/02/21 (!) 160/80  04/29/21 140/72    Pulse Readings from Last 3 Encounters:  09/02/21 72  04/29/21 91  01/28/21 (!) 106    Lab Results  Component Value Date/Time   HGBA1C 6.9 (A) 12/09/2021 10:33 AM   HGBA1C 6.9 (A) 09/02/2021 09:58 AM   HGBA1C 8.6 (A) 04/29/2021 09:42 AM   HGBA1C 10.5 (H) 01/28/2021 10:25 AM   HGBA1C 10.0 (H) 04/24/2020 10:17 AM   Lab Results  Component Value Date   CREATININE 0.97 01/28/2021   BUN 19 01/28/2021   GFR 55.43 (L) 01/28/2021   GFRNONAA 51 (L) 11/03/2019   GFRAA 59 (L) 11/03/2019   NA 138 01/28/2021   K 3.9 01/28/2021   CALCIUM 9.4 01/28/2021  CO2 26 01/28/2021   Elida Pharmacist Assistant [phone]

## 2022-03-17 ENCOUNTER — Encounter: Payer: Self-pay | Admitting: Adult Health

## 2022-03-17 ENCOUNTER — Ambulatory Visit (INDEPENDENT_AMBULATORY_CARE_PROVIDER_SITE_OTHER): Payer: Medicare HMO | Admitting: Adult Health

## 2022-03-17 VITALS — BP 140/80 | Temp 98.1°F | Ht 62.0 in | Wt 128.0 lb

## 2022-03-17 DIAGNOSIS — E118 Type 2 diabetes mellitus with unspecified complications: Secondary | ICD-10-CM

## 2022-03-17 DIAGNOSIS — R6889 Other general symptoms and signs: Secondary | ICD-10-CM | POA: Diagnosis not present

## 2022-03-17 DIAGNOSIS — I10 Essential (primary) hypertension: Secondary | ICD-10-CM

## 2022-03-17 LAB — POCT GLYCOSYLATED HEMOGLOBIN (HGB A1C): Hemoglobin A1C: 6.7 % — AB (ref 4.0–5.6)

## 2022-03-17 MED ORDER — TRULICITY 0.75 MG/0.5ML ~~LOC~~ SOAJ: 0.7500 mg | SUBCUTANEOUS | 2 refills | Status: AC

## 2022-03-17 MED ORDER — GLIPIZIDE ER 5 MG PO TB24: 5.0000 mg | ORAL_TABLET | Freq: Every evening | ORAL | 0 refills | Status: DC

## 2022-03-17 MED ORDER — INSULIN GLARGINE 100 UNIT/ML SOLOSTAR PEN: PEN_INJECTOR | SUBCUTANEOUS | 11 refills | Status: DC

## 2022-03-17 NOTE — Progress Notes (Signed)
Subjective:    Patient ID: Katrina Garrison, female    DOB: Apr 22, 1940, 82 y.o.   MRN: [id number]  Diabetes   82 year old female who  has a past medical history of Anemia, Diabetes mellitus type II (2005), Diverticulosis, GERD (gastroesophageal reflux disease), History of lithotripsy, Hypertension, and Kidney stones.  DM type 2 -she is currently managed with Levemir 20 units in the morning and 16 units in the evening,  glipizide 5 mg extended release in the evening, and Ozempic 0.25 mg weekly.  She is using a continuous glucose monitor to check her blood sugars. Today she reports that she is no longer tolerating ozempic and has pretty continuous abdominal pain and constipation.Levimer is no longer being covered under her health insurance   Lab Results  Component Value Date   HGBA1C 6.9 (A) 12/09/2021    She has previously tried Novolin R, [place], [place], [place], Tresiba, and metformin and reports side effects with all of these medications.  CONTINUOUS GLUCOSE MONITORING RECORD INTERPRETATION                 Dates of Recording:    Sensor description: Dexcom   Results statistics:   CGM use % of time      Time in range 71%  % Time Above 180 24%  % Time above 250 3%  % Time Below target 2%    HTN -managed with Norvasc 10 mg in the morning, olmesartan 40 mg in the evening, and spironolactone 25 mg in the evening. BP Readings from Last 3 Encounters:  03/17/22 (!) 140/80  12/09/21 130/80  09/02/21 (!) 160/80   Wt Readings from Last 10 Encounters:  03/17/22 128 lb (58.1 kg)  12/09/21 128 lb (58.1 kg)  10/21/21 122 lb (55.3 kg)  09/02/21 122 lb (55.3 kg)  04/29/21 133 lb (60.3 kg)  01/28/21 135 lb (61.2 kg)  12/11/20 132 lb (59.9 kg)  11/08/20 132 lb (59.9 kg)  08/09/20 135 lb (61.2 kg)  04/24/20 135 lb (61.2 kg)   Review of Systems See HPI   Past Medical History:  Diagnosis Date   Anemia    Diabetes mellitus type II 2005   Diverticulosis    GERD  (gastroesophageal reflux disease)    History of lithotripsy    x3 for kidney stones   Hypertension    Kidney stones     Social History   Socioeconomic History   Marital status: Divorced    Spouse name: Not on file   Number of children: Not on file   Years of education: Not on file   Highest education level: Some college, no degree  Occupational History   Occupation: Retired  Tobacco Use   Smoking status: Former    Years: 1.50    Types: Cigarettes    Quit date: 01/28/1985    Years since quitting: 37.1   Smokeless tobacco: Never  Vaping Use   Vaping Use: Never used  Substance and Sexual Activity   Alcohol use: No   Drug use: No   Sexual activity: Not on file  Other Topics Concern   Not on file  Social History Narrative   Not on file   Social Determinants of Health   Financial Resource Strain: Low Risk  (03/16/2022)   Overall Financial Resource Strain (CARDIA)    Difficulty of Paying Living Expenses: Not very hard  Food Insecurity: No Food Insecurity (03/16/2022)   Hunger Vital Sign    Worried About Running Out of Food  in the Last Year: Never true    [place] in the Last Year: Never true  Transportation Needs: No Transportation Needs (03/16/2022)   PRAPARE - Hydrologist (Medical): No    Lack of Transportation (Non-Medical): No  Physical Activity: Insufficiently Active (03/16/2022)   Exercise Vital Sign    Days of Exercise per Week: 3 days    Minutes of Exercise per Session: 20 min  Stress: No Stress Concern Present (03/16/2022)   [place]    Feeling of Stress : Only a little  Social Connections: Moderately Integrated (03/16/2022)   Social Connection and Isolation Panel [NHANES]    Frequency of Communication with Friends and Family: More than three times a week    Frequency of Social Gatherings with Friends and Family: More than three times a week    Attends Religious  Services: 1 to 4 times per year    Active Member of [employer] or Organizations: Yes    Attends Archivist Meetings: More than 4 times per year    Marital Status: Widowed  Intimate Partner Violence: Not At Risk (10/08/2020)   Humiliation, Afraid, Rape, and Kick questionnaire    Fear of Current or Ex-Partner: No    Emotionally Abused: No    Physically Abused: No    Sexually Abused: No    Past Surgical History:  Procedure Laterality Date   ABDOMINAL HYSTERECTOMY     childbirth x 2     EYE SURGERY     cataract    Family History  Problem Relation Age of Onset   Thyroid cancer Mother    Diabetes Mother    Hypertension Mother    Alcohol abuse Father    Hypertension Father    Multiple sclerosis Daughter    Multiple sclerosis Daughter    Multiple sclerosis Sister     Allergies  Allergen Reactions   Hydrochlorothiazide    Jardiance [Empagliflozin]     Yeast infections    Lisinopril Cough   Losartan Cough   Metformin And Related Diarrhea   Metoprolol Swelling    Leg cramping    Novolin R [Insulin] Itching   Sulfamethoxazole     REACTION: "comatose"   Toujeo Solostar [Insulin Glargine] Rash    Current Outpatient Medications on File Prior to Visit  Medication Sig Dispense Refill   amLODipine (NORVASC) 10 MG tablet Take 1 tablet (10 mg total) by mouth daily. 90 tablet 3   aspirin 81 MG tablet Take 81 mg by mouth as needed.     COMFORT EZ PEN NEEDLES 31G X 6 MM MISC USE ONCE daily AS DIRECTED 90 each 3   glipiZIDE (GLUCOTROL XL) 5 MG 24 hr tablet TAKE ONE TABLET BY MOUTH EVERY EVENING 90 tablet 0   ibuprofen (ADVIL,MOTRIN) 200 MG tablet Take 200 mg by mouth every 6 (six) hours as needed. Reported on 03/22/2015     Multiple Vitamins-Minerals (MULTIPLE VITAMINS/WOMENS PO) Take by mouth.     olmesartan (BENICAR) 40 MG tablet Take 40 mg by mouth daily.     spironolactone (ALDACTONE) 25 MG tablet Take 1 tablet (25 mg total) by mouth daily. 90 tablet 3   No current  facility-administered medications on file prior to visit.    BP (!) 140/80   Temp 98.1 F (36.7 C)   Ht 5' 2"$  (1.575 m)   Wt 128 lb (58.1 kg)   SpO2 98%   BMI 23.41  kg/m       Objective:   Physical Exam Vitals and nursing note reviewed.  Constitutional:      Appearance: Normal appearance.  Cardiovascular:     Rate and Rhythm: Normal rate and regular rhythm.     Pulses: Normal pulses.     Heart sounds: Normal heart sounds.  Pulmonary:     Effort: Pulmonary effort is normal.     Breath sounds: Normal breath sounds.  Skin:    General: Skin is warm and dry.  Neurological:     General: No focal deficit present.     Mental Status: She is alert and oriented to person, place, and time.  Psychiatric:        Mood and Affect: Mood normal.        Behavior: Behavior normal.        Thought Content: Thought content normal.        Judgment: Judgment normal.       Assessment & Plan:  1. DM type 2, controlled, with complication (HCC)  - POC HgB A1c- 6.7  - Will switch Levimer to Lantus and Ozempic to Trulicity A999333 mg daily.  -Follow up un 3 months for CPE or sooner if needed - insulin glargine (LANTUS) 100 UNIT/ML Solostar Pen; 20 units in the morning and 16 units in the evening  Dispense: 15 mL; Refill: 11 - Dulaglutide (TRULICITY) A999333 0000000 SOPN; Inject 0.75 mg into the skin once a week.  Dispense: 2 mL; Refill: 2 - glipiZIDE (GLUCOTROL XL) 5 MG 24 hr tablet; Take 1 tablet (5 mg total) by mouth every evening.  Dispense: 90 tablet; Refill: 0  2. Essential hypertension - Controlled. No change in medication   Dorothyann Peng, NP

## 2022-03-17 NOTE — Patient Instructions (Signed)
I am going to change your Levimer to Lantus - take it the same as you did your old insulin   I am going to switch your ozempic to trulicity   Follow up in three months

## 2022-03-24 ENCOUNTER — Encounter (INDEPENDENT_AMBULATORY_CARE_PROVIDER_SITE_OTHER): Payer: Medicare HMO | Admitting: Ophthalmology

## 2022-03-24 DIAGNOSIS — H35033 Hypertensive retinopathy, bilateral: Secondary | ICD-10-CM

## 2022-03-24 DIAGNOSIS — H43813 Vitreous degeneration, bilateral: Secondary | ICD-10-CM | POA: Diagnosis not present

## 2022-03-24 DIAGNOSIS — I10 Essential (primary) hypertension: Secondary | ICD-10-CM | POA: Diagnosis not present

## 2022-03-24 DIAGNOSIS — E113313 Type 2 diabetes mellitus with moderate nonproliferative diabetic retinopathy with macular edema, bilateral: Secondary | ICD-10-CM | POA: Diagnosis not present

## 2022-04-03 ENCOUNTER — Telehealth: Payer: Self-pay

## 2022-04-03 NOTE — Progress Notes (Unsigned)
Care Management & Coordination Services Pharmacy Team  Reason for Encounter: Medication coordination and delivery  Contacted patient to discuss medications and coordinate delivery from [facility]. {US HC Outreach:28874}  Cycle dispensing form sent to *** for review.   Last adherence delivery date:03/16/2022      Patient is due for next adherence delivery on: 04/15/2022  This delivery to include: Adherence Packaging  30 Days  Glipizide ER 5 mg - take 1 tablet at bedtime Spironolactone 25 mg - take 1 tablet at breakfast Olmesartan 40 mg  - take 1 tablet with dinner Amlodipine 10 mg  - take 1 tablet at breakfast Comfort ez pen needles 0000000 as directed Trulicity 0.'75mg'$ /0.65m - inject 0.75 mg SQ weekly Lantus solostar 100 units/ml - inject 20 un in the AM and 16 un in the PM  Patient declined the following medications this month: ***  {Delivery dUJ:[id number] Any concerns about your medications? {yes/no:20286}  How often do you forget or accidentally miss a dose? {Missed doses:25554}  Is patient in packaging Yes  If yesWhat is the date on your next pill pack?  Any concerns or issues with your packaging?  Recent blood pressure readings are as follows:***  Recent blood glucose readings are as follows:***  Chart review: Recent office visits:  03/17/2022 CDorothyann PengNP - Patient was seen for DM type 2, controlled, with complication and an additional concern. Started Dulaglutide 0.75 mg weekly and Lantus 100 un/ml 20 units in the AM and 16 units in the PM.  Discontinued Levemir and Ozempic  Recent consult visits:  None  Hospital visits:  None   Medications: Outpatient Encounter Medications as of 04/03/2022  Medication Sig   amLODipine (NORVASC) 10 MG tablet Take 1 tablet (10 mg total) by mouth daily.   aspirin 81 MG tablet Take 81 mg by mouth as needed.   COMFORT EZ PEN NEEDLES 31G X 6 MM MISC USE ONCE daily AS DIRECTED   Dulaglutide (TRULICITY) [id number]  Inject 0.75 mg into the skin once a week.   glipiZIDE (GLUCOTROL XL) 5 MG 24 hr tablet Take 1 tablet (5 mg total) by mouth every evening.   ibuprofen (ADVIL,MOTRIN) 200 MG tablet Take 200 mg by mouth every 6 (six) hours as needed. Reported on 03/22/2015   insulin glargine (LANTUS) 100 UNIT/ML Solostar Pen 20 units in the morning and 16 units in the evening   Multiple Vitamins-Minerals (MULTIPLE VITAMINS/WOMENS PO) Take by mouth.   olmesartan (BENICAR) 40 MG tablet Take 40 mg by mouth daily.   spironolactone (ALDACTONE) 25 MG tablet Take 1 tablet (25 mg total) by mouth daily.   No facility-administered encounter medications on file as of 04/03/2022.   BP Readings from Last 3 Encounters:  03/17/22 (!) 140/80  12/09/21 130/80  09/02/21 (!) 160/80    Pulse Readings from Last 3 Encounters:  09/02/21 72  04/29/21 91  01/28/21 (!) 106    Lab Results  Component Value Date/Time   HGBA1C 6.7 (A) 03/17/2022 09:40 AM   HGBA1C 6.9 (A) 12/09/2021 10:33 AM   HGBA1C 8.6 (A) 04/29/2021 09:42 AM   HGBA1C 10.5 (H) 01/28/2021 10:25 AM   HGBA1C 10.0 (H) 04/24/2020 10:17 AM   Lab Results  Component Value Date   CREATININE 0.97 01/28/2021   BUN 19 01/28/2021   GFR 55.43 (L) 01/28/2021   GFRNONAA 51 (L) 11/03/2019   GFRAA 59 (L) 11/03/2019   NA 138 01/28/2021   K 3.9 01/28/2021   CALCIUM 9.4 01/28/2021  CO2 26 01/28/2021   Elida Pharmacist Assistant [phone]

## 2022-04-07 ENCOUNTER — Telehealth: Payer: Self-pay | Admitting: Adult Health

## 2022-04-07 NOTE — Telephone Encounter (Signed)
Please advise  Last note   " . DM type 2, controlled, with complication ([place])   - POC HgB A1c- 6.7  - Will switch Levimer to Lantus and Ozempic to Trulicity A999333 mg daily. "

## 2022-04-07 NOTE — Telephone Encounter (Signed)
Patient notified of update  and verbalized understanding. 

## 2022-04-07 NOTE — Telephone Encounter (Signed)
Pt is calling and would like to know if she can finish taking levemir before starting on insulin glargine (LANTUS) 100 UNIT/ML Solostar Pen

## 2022-04-13 ENCOUNTER — Other Ambulatory Visit: Payer: Self-pay | Admitting: Adult Health

## 2022-04-13 DIAGNOSIS — I10 Essential (primary) hypertension: Secondary | ICD-10-CM

## 2022-04-21 ENCOUNTER — Encounter (INDEPENDENT_AMBULATORY_CARE_PROVIDER_SITE_OTHER): Payer: Medicare HMO | Admitting: Ophthalmology

## 2022-04-21 DIAGNOSIS — I10 Essential (primary) hypertension: Secondary | ICD-10-CM

## 2022-04-21 DIAGNOSIS — H43813 Vitreous degeneration, bilateral: Secondary | ICD-10-CM

## 2022-04-21 DIAGNOSIS — E113313 Type 2 diabetes mellitus with moderate nonproliferative diabetic retinopathy with macular edema, bilateral: Secondary | ICD-10-CM

## 2022-04-21 DIAGNOSIS — H35033 Hypertensive retinopathy, bilateral: Secondary | ICD-10-CM | POA: Diagnosis not present

## 2022-04-27 ENCOUNTER — Telehealth: Payer: Self-pay | Admitting: Adult Health

## 2022-04-27 NOTE — Telephone Encounter (Signed)
Patient requesting a call, says she is having issues with the medications Dulaglutide (TRULICITY) A999333 0000000 SOPN insulin glargine (LANTUS) 100 UNIT/ML Solostar Pen

## 2022-04-29 ENCOUNTER — Telehealth: Payer: Self-pay | Admitting: Adult Health

## 2022-04-29 NOTE — Telephone Encounter (Signed)
Looks like pt has been scheduled for tomorrow in office. No further action needed.

## 2022-04-29 NOTE — Telephone Encounter (Signed)
Spoke to pt and she stated that she is having severe pain on lower R side and needs medication. Pt stated that this is the 3rd week and think it may be from the Trulicity. Pt using Tylenol extra strength and CBD oil to help. Pt stated that she is unable to get to [place] for an appt. And denied visit. Pt stated she has to call transportation and she has to let them know 3 days a head of time.

## 2022-04-29 NOTE — Telephone Encounter (Signed)
error 

## 2022-04-30 ENCOUNTER — Ambulatory Visit (INDEPENDENT_AMBULATORY_CARE_PROVIDER_SITE_OTHER): Payer: Medicare HMO | Admitting: Adult Health

## 2022-04-30 ENCOUNTER — Encounter: Payer: Self-pay | Admitting: Adult Health

## 2022-04-30 VITALS — BP 150/80 | HR 85 | Temp 98.2°F | Ht 62.0 in

## 2022-04-30 DIAGNOSIS — E1165 Type 2 diabetes mellitus with hyperglycemia: Secondary | ICD-10-CM | POA: Diagnosis not present

## 2022-04-30 DIAGNOSIS — T148XXA Other injury of unspecified body region, initial encounter: Secondary | ICD-10-CM

## 2022-04-30 DIAGNOSIS — S39012A Strain of muscle, fascia and tendon of lower back, initial encounter: Secondary | ICD-10-CM | POA: Diagnosis not present

## 2022-04-30 MED ORDER — CYCLOBENZAPRINE HCL 5 MG PO TABS: 5.0000 mg | ORAL_TABLET | Freq: Every day | ORAL | 0 refills | Status: DC

## 2022-04-30 NOTE — Progress Notes (Signed)
Subjective:    Patient ID: Katrina Garrison, female    DOB: Mar 17, 1940, 82 y.o.   MRN: [id number]  Back Pain   82 year old female who  has a past medical history of Anemia, Diabetes mellitus type II (2005), Diverticulosis, GERD (gastroesophageal reflux disease), History of lithotripsy, Hypertension, and Kidney stones.  She presents to the office today for with an acute issue of right sided low back pain. Pain has been preset for about three weeks but over the last few days the pain has been worse. Pain is worse with bending, twitching, changing in positions such as getting out of bed. Pain is felt as a aching stretching sensation.   She denies UTI like symptoms   She has a history of kidney stones 25 years ago.   At home she has been using Tyleol and CBD cream. She has noticed that the BD cream is helpful but costly.    Review of Systems  Musculoskeletal:  Positive for back pain.   See HPI   Past Medical History:  Diagnosis Date   Anemia    Diabetes mellitus type II 2005   Diverticulosis    GERD (gastroesophageal reflux disease)    History of lithotripsy    x3 for kidney stones   Hypertension    Kidney stones     Social History   Socioeconomic History   Marital status: Divorced    Spouse name: Not on file   Number of children: Not on file   Years of education: Not on file   Highest education level: Some college, no degree  Occupational History   Occupation: Retired  Tobacco Use   Smoking status: Former    Years: 1.5    Types: Cigarettes    Quit date: 01/28/1985    Years since quitting: 37.2   Smokeless tobacco: Never  Vaping Use   Vaping Use: Never used  Substance and Sexual Activity   Alcohol use: No   Drug use: No   Sexual activity: Not on file  Other Topics Concern   Not on file  Social History Narrative   Not on file   Social Determinants of Health   Financial Resource Strain: Low Risk  (03/16/2022)   Overall Financial Resource Strain (CARDIA)     Difficulty of Paying Living Expenses: Not very hard  Food Insecurity: No Food Insecurity (03/16/2022)   Hunger Vital Sign    Worried About Running Out of Food in the Last Year: Never true    [place] in the Last Year: Never true  Transportation Needs: No Transportation Needs (03/16/2022)   PRAPARE - Hydrologist (Medical): No    Lack of Transportation (Non-Medical): No  Physical Activity: Insufficiently Active (03/16/2022)   Exercise Vital Sign    Days of Exercise per Week: 3 days    Minutes of Exercise per Session: 20 min  Stress: No Stress Concern Present (03/16/2022)   [place]    Feeling of Stress : Only a little  Social Connections: Moderately Integrated (03/16/2022)   Social Connection and Isolation Panel [NHANES]    Frequency of Communication with Friends and Family: More than three times a week    Frequency of Social Gatherings with Friends and Family: More than three times a week    Attends Religious Services: 1 to 4 times per year    Active Member of Clubs or Organizations: Yes    Attends  Club or Organization Meetings: More than 4 times per year    Marital Status: Widowed  Intimate Partner Violence: Not At Risk (10/08/2020)   Humiliation, Afraid, Rape, and Kick questionnaire    Fear of Current or Ex-Partner: No    Emotionally Abused: No    Physically Abused: No    Sexually Abused: No    Past Surgical History:  Procedure Laterality Date   ABDOMINAL HYSTERECTOMY     childbirth x 2     EYE SURGERY     cataract    Family History  Problem Relation Age of Onset   Thyroid cancer Mother    Diabetes Mother    Hypertension Mother    Alcohol abuse Father    Hypertension Father    Multiple sclerosis Daughter    Multiple sclerosis Daughter    Multiple sclerosis Sister     Allergies  Allergen Reactions   Hydrochlorothiazide    Jardiance [Empagliflozin]     Yeast  infections    Lisinopril Cough   Losartan Cough   Metformin And Related Diarrhea   Metoprolol Swelling    Leg cramping    Novolin R [Insulin] Itching   Toujeo Solostar [Insulin Glargine] Rash    Current Outpatient Medications on File Prior to Visit  Medication Sig Dispense Refill   amLODipine (NORVASC) 10 MG tablet TAKE ONE TABLET BY MOUTH EVERY MORNING 90 tablet 3   aspirin 81 MG tablet Take 81 mg by mouth as needed.     COMFORT EZ PEN NEEDLES 31G X 6 MM MISC USE ONCE daily AS DIRECTED 90 each 3   Dulaglutide (TRULICITY) A999333 0000000 SOPN Inject 0.75 mg into the skin once a week. 2 mL 2   glipiZIDE (GLUCOTROL XL) 5 MG 24 hr tablet Take 1 tablet (5 mg total) by mouth every evening. 90 tablet 0   ibuprofen (ADVIL,MOTRIN) 200 MG tablet Take 200 mg by mouth every 6 (six) hours as needed. Reported on 03/22/2015     insulin glargine (LANTUS) 100 UNIT/ML Solostar Pen 20 units in the morning and 16 units in the evening 15 mL 11   Multiple Vitamins-Minerals (MULTIPLE VITAMINS/WOMENS PO) Take by mouth.     olmesartan (BENICAR) 40 MG tablet TAKE ONE TABLET BY MOUTH EVERY EVENING 90 tablet 3   spironolactone (ALDACTONE) 25 MG tablet Take 1 tablet (25 mg total) by mouth daily. 90 tablet 3   No current facility-administered medications on file prior to visit.    BP (!) 150/80   Pulse 85   Temp 98.2 F (36.8 C) (Oral)   Ht 5\' 2"  (1.575 m)   SpO2 98%   BMI 23.41 kg/m       Objective:   Physical Exam Vitals and nursing note reviewed.  Constitutional:      Appearance: Normal appearance.  Cardiovascular:     Rate and Rhythm: Normal rate and regular rhythm.     Pulses: Normal pulses.     Heart sounds: Normal heart sounds.  Pulmonary:     Effort: Pulmonary effort is normal.     Breath sounds: Normal breath sounds.  Musculoskeletal:        General: Tenderness present.     Lumbar back: Tenderness present. No bony tenderness. Decreased range of motion.       Back:     Comments: Has  pain with bending forward and to the left.   Skin:    General: Skin is warm and dry.  Neurological:     General: No  focal deficit present.     Mental Status: She is alert and oriented to person, place, and time.  Psychiatric:        Mood and Affect: Mood normal.        Behavior: Behavior normal.        Thought Content: Thought content normal.        Judgment: Judgment normal.       Assessment & Plan:  1. Muscle strain - Exam consistent with muscle strain. Will send in Flexeril - she is aware to take this at night due to sedating effects  - Use a heating pad and stretching exercises  - Follow up if not improving in the next 2-3 days.  - cyclobenzaprine (FLEXERIL) 5 MG tablet; Take 1 tablet (5 mg total) by mouth at bedtime.  Dispense: 15 tablet; Refill: 0  Dorothyann Peng, NP

## 2022-04-30 NOTE — Patient Instructions (Signed)
Your exam is consistent with a muscle strain   I have sent in a prescription for a muscle relaxer called Flexeril - take this at night as it can make you sleepy  Use a heating pad and do some stretching exercises like we did in the office  A massage will also be

## 2022-05-12 DIAGNOSIS — R6889 Other general symptoms and signs: Secondary | ICD-10-CM | POA: Diagnosis not present

## 2022-05-13 ENCOUNTER — Telehealth: Payer: Self-pay

## 2022-05-13 NOTE — Progress Notes (Signed)
Care Management & Coordination Services Pharmacy Team  Reason for Encounter: Medication coordination and delivery  Contacted patient to discuss medications and coordinate delivery from [facility]. Spoke with patient on 05/13/2022   Cycle dispensing form sent to [facility] for review.   Last adherence delivery date:04/15/2022 patient postponed to 04/27/2022      Patient is due for next adherence delivery on: 05/25/2022  This delivery to include: Adherence Packaging  30 Days  Glipizide ER 5 mg - take 1 tablet at bedtime Spironolactone 25 mg - take 1 tablet at breakfast Olmesartan 40 mg  - take 1 tablet with dinner Amlodipine 10 mg  - take 1 tablet at breakfast Trulicity 0.75mg /0.65ml - inject 0.75 mg SQ weekly  Patient declined the following medications this month: Ez touch pen needles 6x31 as directed Lantus solostar 100 units/ml - inject 20 un in the AM and 16 un in the PM Patient finished Levemir pens before discontinuing and starting Lantus, due to this, patient has plenty on had to last through the end of May.   Confirmed delivery date of 05/25/2022, advised patient that pharmacy will contact them the morning of delivery.  Any concerns about your medications? No  How often do you forget or accidentally miss a dose? Never  Is patient in packaging Yes    Chart review: Recent office visits:  04/30/2022 [name] NP - Patient was seen for muscle strain. Started Cyclobenzaprine 5 mg daily.   Recent consult visits:  None  Hospital visits:  None  Medications: Outpatient Encounter Medications as of 05/13/2022  Medication Sig   amLODipine (NORVASC) 10 MG tablet TAKE ONE TABLET BY MOUTH EVERY MORNING   aspirin 81 MG tablet Take 81 mg by mouth as needed.   COMFORT EZ PEN NEEDLES 31G X 6 MM MISC USE ONCE daily AS DIRECTED   cyclobenzaprine (FLEXERIL) 5 MG tablet Take 1 tablet (5 mg total) by mouth at bedtime.   Dulaglutide (TRULICITY) 0.75 MG/0.5ML SOPN Inject  0.75 mg into the skin once a week.   glipiZIDE (GLUCOTROL XL) 5 MG 24 hr tablet Take 1 tablet (5 mg total) by mouth every evening.   ibuprofen (ADVIL,MOTRIN) 200 MG tablet Take 200 mg by mouth every 6 (six) hours as needed. Reported on 03/22/2015   insulin glargine (LANTUS) 100 UNIT/ML Solostar Pen 20 units in the morning and 16 units in the evening   Multiple Vitamins-Minerals (MULTIPLE VITAMINS/WOMENS PO) Take by mouth.   olmesartan (BENICAR) 40 MG tablet TAKE ONE TABLET BY MOUTH EVERY EVENING   spironolactone (ALDACTONE) 25 MG tablet Take 1 tablet (25 mg total) by mouth daily.   No facility-administered encounter medications on file as of 05/13/2022.   BP Readings from Last 3 Encounters:  04/30/22 (!) 150/80  03/17/22 (!) 140/80  12/09/21 130/80    Pulse Readings from Last 3 Encounters:  04/30/22 85  09/02/21 72  04/29/21 91    Lab Results  Component Value Date/Time   HGBA1C 6.7 (A) 03/17/2022 09:40 AM   HGBA1C 6.9 (A) 12/09/2021 10:33 AM   HGBA1C 8.6 (A) 04/29/2021 09:42 AM   HGBA1C 10.5 (H) 01/28/2021 10:25 AM   HGBA1C 10.0 (H) 04/24/2020 10:17 AM   Lab Results  Component Value Date   CREATININE 0.97 01/28/2021   BUN 19 01/28/2021   GFR 55.43 (L) 01/28/2021   GFRNONAA 51 (L) 11/03/2019   GFRAA 59 (L) 11/03/2019   NA 138 01/28/2021   K 3.9 01/28/2021   CALCIUM 9.4 01/28/2021  CO2 26 01/28/2021   Inetta Fermo CMA  Clinical Pharmacist Assistant [phone]

## 2022-05-15 NOTE — Progress Notes (Signed)
Care Management & Coordination Services Pharmacy Note  05/15/2022 Name:  Katrina Garrison MRN:  [id number] DOB:  05/18/1940  Summary: BP not at goal <140/90 A1C at goal <7, is having lows in the 50s several nights each week since starting trulicity  Recommendations/Changes made from today's visit: -Counseled on sodium intake and to check BP 2-3x/week -DECREASE Lantus to 18 units in the morning, 12 units in the evening, continue all other medications the same  Follow up plan: DM call in 1 week to see if lows have resolved BP call in 2 months Pharmacist visit in 6 months   Subjective: Katrina Garrison is an 82 y.o. year old female who is a primary patient of [name], NP.  The care coordination team was consulted for assistance with disease management and care coordination needs.    Engaged with patient by telephone for follow up visit.  Recent office visits: 04/30/22 [name], NP - For muscle strain. Start flexeril  03/17/2022 [name] NP - Patient was seen for DM type 2, controlled, with complication and an additional concern. Started Dulaglutide 0.75 mg weekly and Lantus 100 un/ml 20 units in the AM and 16 units in the PM.  Discontinued Levemir and Ozempic   12/09/2021 [name] NP - Patient was seen for DM type 2, controlled, with complication and essential hypertension. Started Ozempic 0.25 mg weekly. Decreased Levemir Flexpen 100 un/ml inject 20 units in moring 10 units in evening. Discontinued Fluconazole. Follow up in 3 months   Recent consult visits: None  Hospital visits: None in previous 6 months   Objective:  Lab Results  Component Value Date   CREATININE 0.97 01/28/2021   BUN 19 01/28/2021   GFR 55.43 (L) 01/28/2021   GFRNONAA 51 (L) 11/03/2019   GFRAA 59 (L) 11/03/2019   NA 138 01/28/2021   K 3.9 01/28/2021   CALCIUM 9.4 01/28/2021   CO2 26 01/28/2021   GLUCOSE 238 (H) 01/28/2021    Lab Results  Component Value Date/Time    HGBA1C 6.7 (A) 03/17/2022 09:40 AM   HGBA1C 6.9 (A) 12/09/2021 10:33 AM   HGBA1C 8.6 (A) 04/29/2021 09:42 AM   HGBA1C 10.5 (H) 01/28/2021 10:25 AM   HGBA1C 10.0 (H) 04/24/2020 10:17 AM   GFR 55.43 (L) 01/28/2021 10:25 AM   GFR 52.47 (L) 04/24/2020 10:17 AM   MICROALBUR 91.0 (H) 12/09/2021 11:09 AM   MICROALBUR 103.8 (H) 01/25/2020 02:40 PM    Last diabetic Eye exam:  Lab Results  Component Value Date/Time   HMDIABEYEEXA Retinopathy (A) 10/03/2021 12:00 AM    Last diabetic Foot exam: No results found for: "HMDIABFOOTEX"   Lab Results  Component Value Date   CHOL 154 01/28/2021   HDL 44.90 01/28/2021   LDLCALC 88 01/28/2021   TRIG 106.0 01/28/2021   CHOLHDL 3 01/28/2021       Latest Ref Rng & Units 01/28/2021   10:25 AM 01/25/2020    2:40 PM 11/03/2019   11:07 AM  Hepatic Function  Total Protein 6.0 - 8.3 g/dL 7.5  7.6  7.2   Albumin 3.5 - 5.2 g/dL 3.8  4.2    AST 0 - 37 U/L 15  15  13    ALT 0 - 35 U/L 12  12  11    Alk Phosphatase 39 - 117 U/L 81  89    Total Bilirubin 0.2 - 1.2 mg/dL 0.7  0.6  0.5     Lab Results  Component Value Date/Time   TSH  2.02 01/28/2021 10:25 AM   TSH 1.51 01/25/2020 02:40 PM       Latest Ref Rng & Units 01/28/2021   10:25 AM 01/25/2020    2:40 PM 11/03/2019   11:07 AM  CBC  WBC 4.0 - 10.5 K/uL 6.5  8.5  8.7   Hemoglobin 12.0 - 15.0 g/dL 91.4  78.2  95.6   Hematocrit 36.0 - 46.0 % 39.0  38.3  40.7   Platelets 150.0 - 400.0 K/uL 226.0  264.0  241     Lab Results  Component Value Date/Time   VD25OH 36 11/03/2019 11:07 AM   VD25OH 22.52 (L) 12/04/2015 10:41 AM   VD25OH 20.23 (L) 11/21/2014 11:04 AM    Clinical ASCVD: No  The ASCVD Risk score (Arnett DK, et al., 2019) failed to calculate for the following reasons:   The 2019 ASCVD risk score is only valid for ages 3 to 45        10/21/2021    3:36 PM 01/28/2021    9:53 AM 10/08/2020   11:43 AM  Depression screen PHQ 2/9  Decreased Interest 0 0 0  Down, Depressed, Hopeless 0 0 0   PHQ - 2 Score 0 0 0     Social History   Tobacco Use  Smoking Status Former   Years: 1.5   Types: Cigarettes   Quit date: 01/28/1985   Years since quitting: 37.3  Smokeless Tobacco Never   BP Readings from Last 3 Encounters:  04/30/22 (!) 150/80  03/17/22 (!) 140/80  12/09/21 130/80   Pulse Readings from Last 3 Encounters:  04/30/22 85  09/02/21 72  04/29/21 91   Wt Readings from Last 3 Encounters:  03/17/22 128 lb (58.1 kg)  12/09/21 128 lb (58.1 kg)  10/21/21 122 lb (55.3 kg)   BMI Readings from Last 3 Encounters:  04/30/22 23.41 kg/m  03/17/22 23.41 kg/m  12/09/21 23.41 kg/m    Allergies  Allergen Reactions   Hydrochlorothiazide    Jardiance [Empagliflozin]     Yeast infections    Lisinopril Cough   Losartan Cough   Metformin And Related Diarrhea   Metoprolol Swelling    Leg cramping    Novolin R [Insulin] Itching   Toujeo Solostar [Insulin Glargine] Rash    Medications Reviewed Today     Reviewed by Sherrin Daisy, CMA (Certified Medical Assistant) on 04/30/22 at 1420  Med List Status: <None>   Medication Order Taking? Sig Documenting Provider Last Dose Status Informant  amLODipine (NORVASC) 10 MG tablet [id number] Yes TAKE ONE TABLET BY MOUTH EVERY MORNING Nafziger, Kandee Keen, NP Taking Active   aspirin 81 MG tablet [id number] Yes Take 81 mg by mouth as needed. [provider] Taking Active   COMFORT EZ PEN NEEDLES 31G X 6 MM MISC [id number] Yes USE ONCE daily AS DIRECTED Nafziger, Kandee Keen, NP Taking Active   Dulaglutide (TRULICITY) 0.75 MG/0.5ML SOPN [id number] Yes Inject 0.75 mg into the skin once a week. Nafziger, Kandee Keen, NP Taking Active   glipiZIDE (GLUCOTROL XL) 5 MG 24 hr tablet [id number] Yes Take 1 tablet (5 mg total) by mouth every evening. Nafziger, Kandee Keen, NP Taking Active   ibuprofen (ADVIL,MOTRIN) 200 MG tablet [id number] Yes Take 200 mg by mouth every 6 (six) hours as needed. Reported on 03/22/2015 [provider] Taking Active Self   insulin glargine (LANTUS) 100 UNIT/ML Solostar Pen [id number] Yes 20 units in the morning and 16 units in the evening Nafziger, Kandee Keen, NP Taking Active   Multiple Vitamins-Minerals (  MULTIPLE VITAMINS/WOMENS PO) [id number] Yes Take by mouth. [provider] Taking Active   olmesartan (BENICAR) 40 MG tablet [id number] Yes TAKE ONE TABLET BY MOUTH EVERY EVENING Nafziger, Kandee Keen, NP Taking Active   spironolactone (ALDACTONE) 25 MG tablet [id number] Yes Take 1 tablet (25 mg total) by mouth daily. Nafziger, Kandee Keen, NP Taking Active             SDOH:  (Social Determinants of Health) assessments and interventions performed: Yes SDOH Interventions    Flowsheet Row Clinical Support from 10/21/2021 in [facility] [place] Chronic Care Management from 03/11/2021 in [facility] [place] Clinical Support from 11/07/2019 in [facility] [place]  SDOH Interventions     Food Insecurity Interventions Intervention Not Indicated -- Intervention Not Indicated  Housing Interventions -- -- Intervention Not Indicated  Transportation Interventions Intervention Not Indicated Intervention Not Indicated Intervention Not Indicated  Depression Interventions/Treatment  -- -- PHQ2-9 Score <4 Follow-up Not Indicated  Financial Strain Interventions Intervention Not Indicated Intervention Not Indicated Intervention Not Indicated  Physical Activity Interventions Other (Comments) -- Intervention Not Indicated  Stress Interventions Intervention Not Indicated -- Intervention Not Indicated  Social Connections Interventions -- -- Intervention Not Indicated       Medication Assistance: None required.  Patient affirms current coverage meets needs.  Medication Access: Within the past 30 days, how often has patient missed a dose of medication? None Is a pillbox or other method used to improve adherence? Yes  Factors that may affect medication adherence? no  barriers identified Are meds synced by current pharmacy? Yes  Are meds delivered by current pharmacy? Yes  Does patient experience delays in picking up medications due to transportation concerns? No   [facility] Reviewed: Is patient disadvantaged to use [facility]?: No  Current Rx insurance plan: [employer] Name and location of Current pharmacy:  [id number] - [address] ([address]), [place] - [address] [place] ([place]) [place] [address] Phone: [phone] Fax: [phone]  [facility] - [place], [place] - [address] [place] [address] Phone: [phone] Fax: [phone]  [facility] services reviewed with patient today?: No  Patient requests to transfer care to [facility]?: No  Reason patient declined to change pharmacies: Patient is already actively enrolled with [facility]  Compliance/Adherence/Medication fill history: Care Gaps: AWV - completed 10/21/2021, scheduled on 10/26/2022 Last eye exam - 10/03/2021 Last foot exam - 01/28/2021 Last BP - 150/80 on 04/30/2022 Last A1C - 6.7 on 03/17/2022 Covid - overdue eGFR - overdue  Star-Rating Drugs: Glipizide 5 mg - last filled 05/20/2022 30 DS at [facility] Olmesartan 40 mg - last filled 05/20/2022 30 DS at [facility] Trulicity 0.75 mg/0.5 ml - last filled 05/20/2022 28 DS at [facility]   Assessment/Plan   Hypertension (BP goal <140/90) -Not ideally controlled -Current treatment: Amlodipine 10mg  1 qd Appropriate, Effective, Safe, Accessible Olmesartan 40mg  1 qd Appropriate, Effective, Safe, Accessible Spironolactone 25mg  1 qd Appropriate, Effective, Safe, Accessible -Medications previously tried: Metoprolol, Losartan, HCTZ, Lisinopril -Current home readings: 138-142/80 -Current dietary habits: mindful of salt intake -Current exercise habits: Not discussed -Denies hypotensive/hypertensive symptoms -Educated on  BP goals and benefits of medications for prevention of heart attack, stroke and kidney damage; Daily salt intake goal < 2300 mg; Importance of home blood pressure monitoring; Proper BP monitoring technique; Symptoms of hypotension and importance of maintaining adequate hydration; -Counseled to monitor BP at home  at least once weekly, document, and provide log at future appointments -Recommended to continue current medication   Diabetes (A1c goal <7%) -Controlled -Current medications: Lantus 20 units in morning, 16 in evening Appropriate, Effective, Query Safe Glipizide XL 5mg  1 qpm Appropriate, Effective, Safe, Accessible Trulicity 0.75mg  1 q week - Sunday Appropriate, Effective, Safe, Accessible -Medications previously tried: Ozempic  -Current home glucose readings fasting glucose: 88-113 post prandial glucose: <180 -Reports hypoglycemic/hyperglycemic symptoms between 3-4am practically every evening in the 50s, once as low as 44 -Educated on A1c and blood sugar goals; Complications of diabetes including kidney damage, retinal damage, and cardiovascular disease; Prevention and management of hypoglycemic episodes; -Counseled to check feet daily and get yearly eye exams -Recommended decreasing lantus to 18 units in the morning and 12 units in the evening  Sherrill Raring Clinical Pharmacist [phone]

## 2022-05-19 ENCOUNTER — Encounter (INDEPENDENT_AMBULATORY_CARE_PROVIDER_SITE_OTHER): Payer: Medicare HMO | Admitting: Ophthalmology

## 2022-05-19 DIAGNOSIS — H35033 Hypertensive retinopathy, bilateral: Secondary | ICD-10-CM

## 2022-05-19 DIAGNOSIS — E113313 Type 2 diabetes mellitus with moderate nonproliferative diabetic retinopathy with macular edema, bilateral: Secondary | ICD-10-CM

## 2022-05-19 DIAGNOSIS — I10 Essential (primary) hypertension: Secondary | ICD-10-CM | POA: Diagnosis not present

## 2022-05-19 DIAGNOSIS — H43813 Vitreous degeneration, bilateral: Secondary | ICD-10-CM

## 2022-05-20 ENCOUNTER — Other Ambulatory Visit: Payer: Self-pay | Admitting: Adult Health

## 2022-05-20 DIAGNOSIS — E1165 Type 2 diabetes mellitus with hyperglycemia: Secondary | ICD-10-CM

## 2022-05-22 ENCOUNTER — Other Ambulatory Visit: Payer: Self-pay

## 2022-05-22 ENCOUNTER — Telehealth: Payer: Self-pay

## 2022-05-22 NOTE — Progress Notes (Signed)
Care Management & Coordination Services Pharmacy Team  Reason for Encounter: Appointment Reminder  Contacted patient to confirm telephone appointment with Delano Metz, PharmD on 05/25/2022 at 2:00. Spoke with patient on 05/22/2022   Do you have any problems getting your medications? No  What is your top health concern you would like to discuss at your upcoming visit? Pt denies  Have you seen any other providers since your last visit with PCP? No  Care Gaps: AWV - completed 10/21/2021, scheduled on 10/26/2022 Last eye exam - 10/03/2021 Last foot exam - 01/28/2021 Last BP - 150/80 on 04/30/2022 Last A1C - 6.7 on 03/17/2022 Covid - overdue eGFR - overdue   Star Rating Drugs: Glipizide 5 mg - last filled 05/20/2022 30 DS at [facility] Olmesartan 40 mg - last filled 05/20/2022 30 DS at [facility] Trulicity 0.75 mg/0.5 ml - last filled 05/20/2022 28 DS at [facility]  Inetta Fermo [facility]  Clinical Pharmacist Assistant [phone]

## 2022-05-25 ENCOUNTER — Ambulatory Visit: Payer: Medicare HMO

## 2022-06-02 ENCOUNTER — Telehealth: Payer: Self-pay

## 2022-06-02 NOTE — Progress Notes (Unsigned)
Care Management & Coordination Services Pharmacy Team  Reason for Encounter: Follow up blood sugar   Contacted patient to follow up past weeks blood sugar readings.  Patient has recently had some low readings.  Lantus was decreased to 18 units in the morning and 12 units in the evening.   Patient states her morning blood sugar readings have been between 82 and 95 for the past week.  She has not had any low readings and has been able to sleep all night without her dexcom waking her up.   Inetta Fermo [facility]  Clinical Pharmacist Assistant [phone]

## 2022-06-12 ENCOUNTER — Telehealth: Payer: Self-pay

## 2022-06-12 NOTE — Progress Notes (Signed)
Care Management & Coordination Services Pharmacy Team  Reason for Encounter: Medication coordination and delivery  Contacted patient to discuss medications and coordinate delivery from [facility]. Spoke with patient on 06/12/2022   Cycle dispensing form sent to [facility] for review.  Recent changes at 05/25/2022 visit with Delano Metz: DECREASE Lantus to 18 units in the morning, 12 units in the evening    Last adherence delivery date: 05/25/2022      Patient is due for next adherence delivery on: 06/24/2022  This delivery to include: Adherence Packaging  30 Days  Glipizide ER 5 mg - take 1 tablet at bedtime Spironolactone 25 mg - take 1 tablet at breakfast Olmesartan 40 mg  - take 1 tablet with dinner Amlodipine 10 mg  - take 1 tablet at breakfast Trulicity 0.75mg /0.75ml - inject 0.75 mg SQ weekly Lantus solostar 100 units/ml - inject 18 un in the AM and 12 un in the PM   Patient declined the following medications this month: Ez touch pen needles 6x31 as directed  Confirmed delivery date of 06/24/2022, advised patient that pharmacy will contact them the morning of delivery.  Any concerns about your medications?  Patient denies, she has an appointment with pcp next week, she feels he may take her off glipizide, she states if he does then she will call the pharmacy [facility] directly and let them know. As for now she would like the order to stay as it is.   How often do you forget or accidentally miss a dose? Patient denies  Is patient in packaging Yes  Recent blood glucose readings are as follows: todays readings  AM Fasting 80  non fasting 90  after 1 cup of coffee 105   Chart review: Recent office visits:  None  Recent consult visits:  None  Hospital visits:  None  Medications: Outpatient Encounter Medications as of 06/12/2022  Medication Sig   amLODipine (NORVASC) 10 MG tablet TAKE ONE TABLET BY MOUTH EVERY MORNING   aspirin 81 MG tablet Take 81 mg by  mouth as needed.   COMFORT EZ PEN NEEDLES 31G X 6 MM MISC USE ONCE daily AS DIRECTED   cyclobenzaprine (FLEXERIL) 5 MG tablet Take 1 tablet (5 mg total) by mouth at bedtime.   Dulaglutide (TRULICITY) 0.75 MG/0.5ML SOPN Inject 0.75 mg into the skin once a week.   glipiZIDE (GLUCOTROL XL) 5 MG 24 hr tablet Take 1 tablet (5 mg total) by mouth every evening.   ibuprofen (ADVIL,MOTRIN) 200 MG tablet Take 200 mg by mouth every 6 (six) hours as needed. Reported on 03/22/2015   insulin glargine (LANTUS) 100 UNIT/ML Solostar Pen 20 units in the morning and 16 units in the evening   Multiple Vitamins-Minerals (MULTIPLE VITAMINS/WOMENS PO) Take by mouth.   olmesartan (BENICAR) 40 MG tablet TAKE ONE TABLET BY MOUTH EVERY EVENING   spironolactone (ALDACTONE) 25 MG tablet TAKE ONE TABLET BY MOUTH ONCE DAILY   No facility-administered encounter medications on file as of 06/12/2022.   BP Readings from Last 3 Encounters:  04/30/22 (!) 150/80  03/17/22 (!) 140/80  12/09/21 130/80    Pulse Readings from Last 3 Encounters:  04/30/22 85  09/02/21 72  04/29/21 91    Lab Results  Component Value Date/Time   HGBA1C 6.7 (A) 03/17/2022 09:40 AM   HGBA1C 6.9 (A) 12/09/2021 10:33 AM   HGBA1C 8.6 (A) 04/29/2021 09:42 AM   HGBA1C 10.5 (H) 01/28/2021 10:25 AM   HGBA1C 10.0 (H) 04/24/2020 10:17 AM  Lab Results  Component Value Date   CREATININE 0.97 01/28/2021   BUN 19 01/28/2021   GFR 55.43 (L) 01/28/2021   GFRNONAA 51 (L) 11/03/2019   GFRAA 59 (L) 11/03/2019   NA 138 01/28/2021   K 3.9 01/28/2021   CALCIUM 9.4 01/28/2021   CO2 26 01/28/2021   Inetta Fermo CMA  Clinical Pharmacist Assistant [phone]

## 2022-06-16 ENCOUNTER — Ambulatory Visit (INDEPENDENT_AMBULATORY_CARE_PROVIDER_SITE_OTHER): Payer: Medicare HMO | Admitting: Adult Health

## 2022-06-16 ENCOUNTER — Encounter: Payer: Self-pay | Admitting: Adult Health

## 2022-06-16 VITALS — BP 140/80 | Temp 98.2°F | Ht 63.0 in | Wt 130.0 lb

## 2022-06-16 DIAGNOSIS — Z0001 Encounter for general adult medical examination with abnormal findings: Secondary | ICD-10-CM | POA: Diagnosis not present

## 2022-06-16 DIAGNOSIS — Z794 Long term (current) use of insulin: Secondary | ICD-10-CM | POA: Diagnosis not present

## 2022-06-16 DIAGNOSIS — Z7985 Long-term (current) use of injectable non-insulin antidiabetic drugs: Secondary | ICD-10-CM

## 2022-06-16 DIAGNOSIS — R350 Frequency of micturition: Secondary | ICD-10-CM

## 2022-06-16 DIAGNOSIS — I10 Essential (primary) hypertension: Secondary | ICD-10-CM

## 2022-06-16 DIAGNOSIS — E119 Type 2 diabetes mellitus without complications: Secondary | ICD-10-CM | POA: Diagnosis not present

## 2022-06-16 DIAGNOSIS — Z7984 Long term (current) use of oral hypoglycemic drugs: Secondary | ICD-10-CM | POA: Diagnosis not present

## 2022-06-16 DIAGNOSIS — R6889 Other general symptoms and signs: Secondary | ICD-10-CM | POA: Diagnosis not present

## 2022-06-16 LAB — LIPID PANEL
Cholesterol: 115 mg/dL (ref 0–200)
HDL: 44.1 mg/dL (ref >39.00)
LDL Cholesterol: 57 mg/dL (ref 0–99)
NonHDL: 70.5
Total CHOL/HDL Ratio: 3
Triglycerides: 66 mg/dL (ref 0.0–149.0)
VLDL: 13.2 mg/dL (ref 0.0–40.0)

## 2022-06-16 LAB — HEMOGLOBIN A1C: Hgb A1c MFr Bld: 6.3 % (ref 4.6–6.5)

## 2022-06-16 LAB — CBC WITH DIFFERENTIAL/PLATELET
Basophils Absolute: 0.1 10*3/uL (ref 0.0–0.1)
Basophils Relative: 1.1 % (ref 0.0–3.0)
Eosinophils Absolute: 0.1 10*3/uL (ref 0.0–0.7)
Eosinophils Relative: 1.1 % (ref 0.0–5.0)
HCT: 36.1 % (ref 36.0–46.0)
Hemoglobin: 11.2 g/dL — ABNORMAL LOW (ref 12.0–15.0)
Lymphocytes Relative: 19.2 % (ref 12.0–46.0)
Lymphs Abs: 1.6 10*3/uL (ref 0.7–4.0)
MCHC: 30.9 g/dL (ref 30.0–36.0)
MCV: 87.9 fl (ref 78.0–100.0)
Monocytes Absolute: 0.4 10*3/uL (ref 0.1–1.0)
Monocytes Relative: 5.2 % (ref 3.0–12.0)
Neutro Abs: 6.3 10*3/uL (ref 1.4–7.7)
Neutrophils Relative %: 73.4 % (ref 43.0–77.0)
Platelets: 312 10*3/uL (ref 150.0–400.0)
RBC: 4.11 Mil/uL (ref 3.87–5.11)
RDW: 16.3 % — ABNORMAL HIGH (ref 11.5–15.5)
WBC: 8.5 10*3/uL (ref 4.0–10.5)

## 2022-06-16 LAB — URINALYSIS, ROUTINE W REFLEX MICROSCOPIC
Bilirubin Urine: NEGATIVE
Hgb urine dipstick: NEGATIVE
Ketones, ur: NEGATIVE
Leukocytes,Ua: NEGATIVE
Nitrite: NEGATIVE
Specific Gravity, Urine: 1.025 (ref 1.000–1.030)
Total Protein, Urine: 100 — AB
Urine Glucose: NEGATIVE
Urobilinogen, UA: 0.2 (ref 0.0–1.0)
pH: 5.5 (ref 5.0–8.0)

## 2022-06-16 LAB — COMPREHENSIVE METABOLIC PANEL
ALT: 17 U/L (ref 0–35)
AST: 21 U/L (ref 0–37)
Albumin: 4.3 g/dL (ref 3.5–5.2)
Alkaline Phosphatase: 88 U/L (ref 39–117)
BUN: 28 mg/dL — ABNORMAL HIGH (ref 6–23)
CO2: 18 mEq/L — ABNORMAL LOW (ref 19–32)
Calcium: 10 mg/dL (ref 8.4–10.5)
Chloride: 112 mEq/L (ref 96–112)
Creatinine, Ser: 1.37 mg/dL — ABNORMAL HIGH (ref 0.40–1.20)
GFR: 36.28 mL/min — ABNORMAL LOW (ref >60.00)
Glucose, Bld: 82 mg/dL (ref 70–99)
Potassium: 4.5 mEq/L (ref 3.5–5.1)
Sodium: 139 mEq/L (ref 135–145)
Total Bilirubin: 0.4 mg/dL (ref 0.2–1.2)
Total Protein: 8.3 g/dL (ref 6.0–8.3)

## 2022-06-16 LAB — TSH: TSH: 1.72 u[IU]/mL (ref 0.35–5.50)

## 2022-06-16 LAB — MICROALBUMIN / CREATININE URINE RATIO
Creatinine,U: 133.8 mg/dL
Microalb Creat Ratio: 53 mg/g — ABNORMAL HIGH (ref 0.0–30.0)
Microalb, Ur: 71 mg/dL — ABNORMAL HIGH (ref 0.0–1.9)

## 2022-06-16 NOTE — Progress Notes (Signed)
Subjective:    Patient ID: Katrina Garrison, female    DOB: 1940-08-26, 82 y.o.   MRN: [id number]  HPI Patient presents for yearly preventative medicine examination. She is a pleasant 82 year old female who  has a past medical history of Anemia, Diabetes mellitus type II (2005), Diverticulosis, GERD (gastroesophageal reflux disease), History of lithotripsy, Hypertension, and Kidney stones.  DM type 2 -she is currently managed with Lantus  18 units in the morning and 12 units in the evening,  glipizide 5 mg extended release in the evening, and Trulicity 0.75 mg weekly ) ozempic caused abdominal pain). She is tolerating trulicity well.   She was having hypoglycemic episodes and a few weeks ago and her insulin was changed from 20 units in the morning and 18 units in the evening  to 18-12. Since her insulin regimen has been changed her average blood sugar in the 130 range.     She is using a continuous glucose monitor to check her blood sugars.  Lab Results  Component Value Date   HGBA1C 6.7 (A) 03/17/2022   She has previously tried Novolin R, [place], [place], [place], [place], and metformin and reports side effects with all of these medications.  CONTINUOUS GLUCOSE MONITORING RECORD INTERPRETATION                 Dates of Recording:    Sensor description: Dexcom   Results statistics:         Time in range 83%  % Time Above 180 12%  % Time above 250 2%  % Time Below target 2%    HTN -managed with Norvasc 10 mg in the morning, olmesartan 40 mg in the evening, and spironolactone 25 mg in the evening. She has not been monitoring her blood sugars at home.  BP Readings from Last 3 Encounters:  06/16/22 (!) 140/80  04/30/22 (!) 150/80  03/17/22 (!) 140/80   Urinary Frequency - new issue. Denies hematuria or dysuria.   All immunizations and health maintenance protocols were reviewed with the patient and needed orders were placed.  Appropriate screening laboratory values were  ordered for the patient including screening of hyperlipidemia, renal function and hepatic function.  Medication reconciliation,  past medical history, social history, problem list and allergies were reviewed in detail with the patient  Goals were established with regard to weight loss, exercise, and  diet in compliance with medications  Wt Readings from Last 3 Encounters:  06/16/22 130 lb (59 kg)  03/17/22 128 lb (58.1 kg)  12/09/21 128 lb (58.1 kg)    Review of Systems  Constitutional: Negative.   HENT: Negative.    Eyes: Negative.   Respiratory: Negative.    Cardiovascular: Negative.   Gastrointestinal: Negative.   Endocrine: Negative.   Genitourinary: Negative.   Musculoskeletal: Negative.   Skin: Negative.   Allergic/Immunologic: Negative.   Neurological: Negative.   Hematological: Negative.   Psychiatric/Behavioral: Negative.     Past Medical History:  Diagnosis Date   Anemia    Diabetes mellitus type II 2005   Diverticulosis    GERD (gastroesophageal reflux disease)    History of lithotripsy    x3 for kidney stones   Hypertension    Kidney stones     Social History   Socioeconomic History   Marital status: Divorced    Spouse name: Not on file   Number of children: Not on file   Years of education: Not on file   Highest education level: Some  college, no degree  Occupational History   Occupation: Retired  Tobacco Use   Smoking status: Former    Years: 1.5    Types: Cigarettes    Quit date: 01/28/1985    Years since quitting: 37.4   Smokeless tobacco: Never  Vaping Use   Vaping Use: Never used  Substance and Sexual Activity   Alcohol use: No   Drug use: No   Sexual activity: Not on file  Other Topics Concern   Not on file  Social History Narrative   Not on file   Social Determinants of Health   Financial Resource Strain: Low Risk  (03/16/2022)   Overall Financial Resource Strain (CARDIA)    Difficulty of Paying Living Expenses: Not very hard   Food Insecurity: No Food Insecurity (05/25/2022)   Hunger Vital Sign    Worried About Running Out of Food in the Last Year: Never true    Ran Out of Food in the Last Year: Never true  Transportation Needs: No Transportation Needs (03/16/2022)   PRAPARE - Administrator, Civil Service (Medical): No    Lack of Transportation (Non-Medical): No  Physical Activity: Insufficiently Active (03/16/2022)   Exercise Vital Sign    Days of Exercise per Week: 3 days    Minutes of Exercise per Session: 20 min  Stress: No Stress Concern Present (03/16/2022)   [employer] of Occupational Health - Occupational Stress Questionnaire    Feeling of Stress : Only a little  Social Connections: Moderately Integrated (03/16/2022)   Social Connection and Isolation Panel [NHANES]    Frequency of Communication with Friends and Family: More than three times a week    Frequency of Social Gatherings with Friends and Family: More than three times a week    Attends Religious Services: 1 to 4 times per year    Active Member of [employer] or Organizations: Yes    Attends Banker Meetings: More than 4 times per year    Marital Status: Widowed  Intimate Partner Violence: Not At Risk (10/08/2020)   Humiliation, Afraid, Rape, and Kick questionnaire    Fear of Current or Ex-Partner: No    Emotionally Abused: No    Physically Abused: No    Sexually Abused: No    Past Surgical History:  Procedure Laterality Date   ABDOMINAL HYSTERECTOMY     childbirth x 2     EYE SURGERY     cataract    Family History  Problem Relation Age of Onset   Thyroid cancer Mother    Diabetes Mother    Hypertension Mother    Alcohol abuse Father    Hypertension Father    Multiple sclerosis Daughter    Multiple sclerosis Daughter    Multiple sclerosis Sister     Allergies  Allergen Reactions   Hydrochlorothiazide    Jardiance [Empagliflozin]     Yeast infections    Lisinopril Cough   Losartan Cough    Metformin And Related Diarrhea   Metoprolol Swelling    Leg cramping    Novolin R [Insulin] Itching   Toujeo Solostar [Insulin Glargine] Rash    Current Outpatient Medications on File Prior to Visit  Medication Sig Dispense Refill   amLODipine (NORVASC) 10 MG tablet TAKE ONE TABLET BY MOUTH EVERY MORNING 90 tablet 3   aspirin 81 MG tablet Take 81 mg by mouth as needed.     COMFORT EZ PEN NEEDLES 31G X 6 MM MISC USE ONCE daily AS DIRECTED  90 each 3   cyclobenzaprine (FLEXERIL) 5 MG tablet Take 1 tablet (5 mg total) by mouth at bedtime. 15 tablet 0   glipiZIDE (GLUCOTROL XL) 5 MG 24 hr tablet Take 1 tablet (5 mg total) by mouth every evening. 90 tablet 0   ibuprofen (ADVIL,MOTRIN) 200 MG tablet Take 200 mg by mouth every 6 (six) hours as needed. Reported on 03/22/2015     insulin glargine (LANTUS) 100 UNIT/ML Solostar Pen 20 units in the morning and 16 units in the evening 15 mL 11   Multiple Vitamins-Minerals (MULTIPLE VITAMINS/WOMENS PO) Take by mouth.     olmesartan (BENICAR) 40 MG tablet TAKE ONE TABLET BY MOUTH EVERY EVENING 90 tablet 3   spironolactone (ALDACTONE) 25 MG tablet TAKE ONE TABLET BY MOUTH ONCE DAILY 90 tablet 3   No current facility-administered medications on file prior to visit.    BP (!) 140/80   Temp 98.2 F (36.8 C) (Oral)   Ht 5\' 3"  (1.6 m)   Wt 130 lb (59 kg) Comment: pt declined  BMI 23.03 kg/m       Objective:   Physical Exam Vitals and nursing note reviewed.  Constitutional:      General: She is not in acute distress.    Appearance: Normal appearance. She is not ill-appearing.  HENT:     Head: Normocephalic and atraumatic.     Right Ear: Tympanic membrane, ear canal and external ear normal. There is no impacted cerumen.     Left Ear: Tympanic membrane, ear canal and external ear normal. There is no impacted cerumen.     Nose: Nose normal. No congestion or rhinorrhea.     Mouth/Throat:     Mouth: Mucous membranes are moist.     Pharynx:  Oropharynx is clear.  Eyes:     Extraocular Movements: Extraocular movements intact.     Conjunctiva/sclera: Conjunctivae normal.     Pupils: Pupils are equal, round, and reactive to light.  Neck:     Vascular: No carotid bruit.  Cardiovascular:     Rate and Rhythm: Normal rate and regular rhythm.     Pulses: Normal pulses.     Heart sounds: No murmur heard.    No friction rub. No gallop.  Pulmonary:     Effort: Pulmonary effort is normal.     Breath sounds: Normal breath sounds.  Abdominal:     General: Abdomen is flat. Bowel sounds are normal. There is no distension.     Palpations: Abdomen is soft. There is no mass.     Tenderness: There is no abdominal tenderness. There is no guarding or rebound.     Hernia: No hernia is present.  Musculoskeletal:        General: Normal range of motion.     Cervical back: Normal range of motion and neck supple.  Lymphadenopathy:     Cervical: No cervical adenopathy.  Skin:    General: Skin is warm and dry.     Capillary Refill: Capillary refill takes less than 2 seconds.  Neurological:     General: No focal deficit present.     Mental Status: She is alert and oriented to person, place, and time.  Psychiatric:        Mood and Affect: Mood normal.        Behavior: Behavior normal.        Thought Content: Thought content normal.        Judgment: Judgment normal.  Assessment & Plan:  1. Encounter for general adult medical examination with abnormal findings Today patient counseled on age appropriate routine health concerns for screening and prevention, each reviewed and up to date or declined. Immunizations reviewed and up to date or declined. Labs ordered and reviewed. Risk factors for depression reviewed and negative. Hearing function and visual acuity are intact. ADLs screened and addressed as needed. Functional ability and level of safety reviewed and appropriate. Education, counseling and referrals performed based on assessed risks  today. Patient provided with a copy of personalized plan for preventive services.   2. Current use of insulin (HCC) - Consider increasing trulicity and decreasing insulin  - CBC with Differential/Platelet; Future - Comprehensive metabolic panel; Future - Lipid panel; Future - TSH; Future - Hemoglobin A1c; Future - Microalbumin/Creatinine Ratio, Urine; Future  3. Diabetes mellitus treated with oral medication (HCC)  - CBC with Differential/Platelet; Future - Comprehensive metabolic panel; Future - Lipid panel; Future - TSH; Future - Hemoglobin A1c; Future - Microalbumin/Creatinine Ratio, Urine; Future  4. Long-term current use of injectable noninsulin antidiabetic medication  - CBC with Differential/Platelet; Future - Comprehensive metabolic panel; Future - Lipid panel; Future - TSH; Future - Hemoglobin A1c; Future - Microalbumin/Creatinine Ratio, Urine; Future  5. Essential hypertension - At goal for age.  - CBC with Differential/Platelet; Future - Comprehensive metabolic panel; Future - Lipid panel; Future - TSH; Future - Hemoglobin A1c; Future - Microalbumin/Creatinine Ratio, Urine; Future  6. Urinary frequency - Will check for UTI  - Urinalysis; Future - Urine Culture; Future - Urine Culture - Urinalysis   [name], NP

## 2022-06-16 NOTE — Patient Instructions (Signed)
It was great seeing you today   We will follow up with you regarding your lab work   Please let me know if you need anything   

## 2022-06-17 ENCOUNTER — Telehealth: Payer: Self-pay | Admitting: Adult Health

## 2022-06-17 ENCOUNTER — Encounter (INDEPENDENT_AMBULATORY_CARE_PROVIDER_SITE_OTHER): Payer: Medicare HMO | Admitting: Ophthalmology

## 2022-06-17 DIAGNOSIS — Z7985 Long-term (current) use of injectable non-insulin antidiabetic drugs: Secondary | ICD-10-CM

## 2022-06-17 DIAGNOSIS — H35033 Hypertensive retinopathy, bilateral: Secondary | ICD-10-CM | POA: Diagnosis not present

## 2022-06-17 DIAGNOSIS — Z794 Long term (current) use of insulin: Secondary | ICD-10-CM | POA: Diagnosis not present

## 2022-06-17 DIAGNOSIS — E113313 Type 2 diabetes mellitus with moderate nonproliferative diabetic retinopathy with macular edema, bilateral: Secondary | ICD-10-CM | POA: Diagnosis not present

## 2022-06-17 DIAGNOSIS — I10 Essential (primary) hypertension: Secondary | ICD-10-CM | POA: Diagnosis not present

## 2022-06-17 DIAGNOSIS — H43813 Vitreous degeneration, bilateral: Secondary | ICD-10-CM | POA: Diagnosis not present

## 2022-06-17 DIAGNOSIS — N289 Disorder of kidney and ureter, unspecified: Secondary | ICD-10-CM

## 2022-06-17 LAB — URINE CULTURE
MICRO NUMBER:: 14984340
Result:: NO GROWTH
SPECIMEN QUALITY:: ADEQUATE

## 2022-06-17 MED ORDER — TRULICITY 1.5 MG/0.5ML ~~LOC~~ SOPN
1.5000 mg | PEN_INJECTOR | SUBCUTANEOUS | 0 refills | Status: AC
Start: 2022-06-17 — End: 2022-09-15

## 2022-06-17 NOTE — Telephone Encounter (Signed)
Updated patient on her labs. Her A1c dropped to 6.3. I am going to stop her evening insulin dose and increase Trulicity.   Labs also showed decreased kidney function past baseline. Appears to be from dehydration. Will have her increase her water intake and come back for repeat lab.

## 2022-06-23 ENCOUNTER — Telehealth: Payer: Self-pay | Admitting: Adult Health

## 2022-06-23 ENCOUNTER — Other Ambulatory Visit (INDEPENDENT_AMBULATORY_CARE_PROVIDER_SITE_OTHER): Payer: Medicare HMO

## 2022-06-23 ENCOUNTER — Other Ambulatory Visit: Payer: Self-pay | Admitting: Adult Health

## 2022-06-23 DIAGNOSIS — N289 Disorder of kidney and ureter, unspecified: Secondary | ICD-10-CM

## 2022-06-23 DIAGNOSIS — E118 Type 2 diabetes mellitus with unspecified complications: Secondary | ICD-10-CM

## 2022-06-23 LAB — BASIC METABOLIC PANEL
BUN: 28 mg/dL — ABNORMAL HIGH (ref 6–23)
CO2: 20 mEq/L (ref 19–32)
Calcium: 10 mg/dL (ref 8.4–10.5)
Chloride: 107 mEq/L (ref 96–112)
Creatinine, Ser: 1.34 mg/dL — ABNORMAL HIGH (ref 0.40–1.20)
GFR: 37.25 mL/min — ABNORMAL LOW (ref >60.00)
Glucose, Bld: 111 mg/dL — ABNORMAL HIGH (ref 70–99)
Potassium: 4.7 mEq/L (ref 3.5–5.1)
Sodium: 137 mEq/L (ref 135–145)

## 2022-06-23 MED ORDER — INSULIN GLARGINE 100 UNIT/ML SOLOSTAR PEN
18.0000 [IU] | PEN_INJECTOR | Freq: Every day | SUBCUTANEOUS | 0 refills | Status: DC
Start: 2022-06-23 — End: 2022-09-22

## 2022-06-23 NOTE — Telephone Encounter (Signed)
Pt states she would like a call back today.  She needs to know when to come back to see [facility].

## 2022-06-23 NOTE — Telephone Encounter (Signed)
Patient informed of when to return for f/u

## 2022-06-24 ENCOUNTER — Other Ambulatory Visit: Payer: Self-pay | Admitting: Adult Health

## 2022-06-24 DIAGNOSIS — N289 Disorder of kidney and ureter, unspecified: Secondary | ICD-10-CM

## 2022-07-08 ENCOUNTER — Telehealth: Payer: Self-pay | Admitting: Adult Health

## 2022-07-08 NOTE — Telephone Encounter (Signed)
Spoke to pt and she stated she is needing Katrina Garrison to verify that Katrina Garrison is her medical PCP. She also stated that he needs to verify that she is being treated for diabetes. The number pt proved is [phone].

## 2022-07-08 NOTE — Telephone Encounter (Signed)
Pt call and stated she want Katrina Garrison to give her a call.

## 2022-07-10 NOTE — Telephone Encounter (Signed)
Called number provider but sounded suspicious. I called pt and she advised that she will have the representative call instead. Will wait on the call.

## 2022-07-14 ENCOUNTER — Other Ambulatory Visit: Payer: Self-pay | Admitting: Adult Health

## 2022-07-14 DIAGNOSIS — E118 Type 2 diabetes mellitus with unspecified complications: Secondary | ICD-10-CM

## 2022-07-15 ENCOUNTER — Encounter (INDEPENDENT_AMBULATORY_CARE_PROVIDER_SITE_OTHER): Payer: Medicare HMO | Admitting: Ophthalmology

## 2022-07-15 DIAGNOSIS — E113313 Type 2 diabetes mellitus with moderate nonproliferative diabetic retinopathy with macular edema, bilateral: Secondary | ICD-10-CM | POA: Diagnosis not present

## 2022-07-15 DIAGNOSIS — H43813 Vitreous degeneration, bilateral: Secondary | ICD-10-CM

## 2022-07-15 DIAGNOSIS — H35033 Hypertensive retinopathy, bilateral: Secondary | ICD-10-CM

## 2022-07-15 DIAGNOSIS — I10 Essential (primary) hypertension: Secondary | ICD-10-CM

## 2022-07-15 DIAGNOSIS — Z7985 Long-term (current) use of injectable non-insulin antidiabetic drugs: Secondary | ICD-10-CM | POA: Diagnosis not present

## 2022-07-15 DIAGNOSIS — Z794 Long term (current) use of insulin: Secondary | ICD-10-CM

## 2022-07-29 ENCOUNTER — Telehealth: Payer: Self-pay | Admitting: Adult Health

## 2022-07-29 ENCOUNTER — Other Ambulatory Visit (INDEPENDENT_AMBULATORY_CARE_PROVIDER_SITE_OTHER): Payer: Medicare HMO

## 2022-07-29 DIAGNOSIS — N289 Disorder of kidney and ureter, unspecified: Secondary | ICD-10-CM | POA: Diagnosis not present

## 2022-07-29 DIAGNOSIS — R6889 Other general symptoms and signs: Secondary | ICD-10-CM | POA: Diagnosis not present

## 2022-07-29 DIAGNOSIS — E1165 Type 2 diabetes mellitus with hyperglycemia: Secondary | ICD-10-CM | POA: Diagnosis not present

## 2022-07-29 LAB — BASIC METABOLIC PANEL
BUN: 24 mg/dL — ABNORMAL HIGH (ref 6–23)
CO2: 22 mEq/L (ref 19–32)
Calcium: 9.5 mg/dL (ref 8.4–10.5)
Chloride: 110 mEq/L (ref 96–112)
Creatinine, Ser: 1.38 mg/dL — ABNORMAL HIGH (ref 0.40–1.20)
GFR: 35.93 mL/min — ABNORMAL LOW (ref >60.00)
Glucose, Bld: 109 mg/dL — ABNORMAL HIGH (ref 70–99)
Potassium: 4 mEq/L (ref 3.5–5.1)
Sodium: 139 mEq/L (ref 135–145)

## 2022-07-29 NOTE — Telephone Encounter (Signed)
Noted  

## 2022-07-29 NOTE — Telephone Encounter (Signed)
Patient dropped off document  Verification of Chronic Condition , to be filled out by provider. Patient requested to send it back via Fax within ASAP. Document is located in providers tray at front office.Please advise at Mobile [phone] (mobile)

## 2022-07-29 NOTE — Telephone Encounter (Signed)
Patient notified that Kandee Keen is out of the office until 9th. Pt verbalized understanding.

## 2022-07-31 NOTE — Telephone Encounter (Signed)
Form was filled out and mailed to pt. Form stated that a staff member can sign as well. Pt notified of update and verbalized understanding.

## 2022-08-05 ENCOUNTER — Other Ambulatory Visit: Payer: Self-pay

## 2022-08-05 DIAGNOSIS — R944 Abnormal results of kidney function studies: Secondary | ICD-10-CM

## 2022-08-06 DIAGNOSIS — R6889 Other general symptoms and signs: Secondary | ICD-10-CM | POA: Diagnosis not present

## 2022-08-06 DIAGNOSIS — H00021 Hordeolum internum right upper eyelid: Secondary | ICD-10-CM | POA: Diagnosis not present

## 2022-08-06 LAB — HM DIABETES EYE EXAM

## 2022-08-12 ENCOUNTER — Encounter (INDEPENDENT_AMBULATORY_CARE_PROVIDER_SITE_OTHER): Payer: Medicare HMO | Admitting: Ophthalmology

## 2022-08-14 DIAGNOSIS — E113493 Type 2 diabetes mellitus with severe nonproliferative diabetic retinopathy without macular edema, bilateral: Secondary | ICD-10-CM | POA: Diagnosis not present

## 2022-08-14 DIAGNOSIS — H35032 Hypertensive retinopathy, left eye: Secondary | ICD-10-CM | POA: Diagnosis not present

## 2022-08-14 DIAGNOSIS — R6889 Other general symptoms and signs: Secondary | ICD-10-CM | POA: Diagnosis not present

## 2022-08-14 DIAGNOSIS — H04123 Dry eye syndrome of bilateral lacrimal glands: Secondary | ICD-10-CM | POA: Diagnosis not present

## 2022-08-14 DIAGNOSIS — H00021 Hordeolum internum right upper eyelid: Secondary | ICD-10-CM | POA: Diagnosis not present

## 2022-08-14 LAB — HM DIABETES EYE EXAM

## 2022-08-20 ENCOUNTER — Encounter: Payer: Self-pay | Admitting: Internal Medicine

## 2022-08-20 DIAGNOSIS — H00021 Hordeolum internum right upper eyelid: Secondary | ICD-10-CM | POA: Diagnosis not present

## 2022-08-20 DIAGNOSIS — R6889 Other general symptoms and signs: Secondary | ICD-10-CM | POA: Diagnosis not present

## 2022-08-20 LAB — HM DIABETES EYE EXAM

## 2022-08-26 ENCOUNTER — Encounter (INDEPENDENT_AMBULATORY_CARE_PROVIDER_SITE_OTHER): Payer: Self-pay

## 2022-09-08 ENCOUNTER — Telehealth: Payer: Self-pay | Admitting: Adult Health

## 2022-09-08 NOTE — Telephone Encounter (Signed)
error 

## 2022-09-10 ENCOUNTER — Encounter (INDEPENDENT_AMBULATORY_CARE_PROVIDER_SITE_OTHER): Payer: Self-pay

## 2022-09-22 ENCOUNTER — Ambulatory Visit (INDEPENDENT_AMBULATORY_CARE_PROVIDER_SITE_OTHER): Payer: Medicare HMO | Admitting: Adult Health

## 2022-09-22 ENCOUNTER — Encounter: Payer: Self-pay | Admitting: Adult Health

## 2022-09-22 VITALS — BP 140/80 | Temp 98.5°F | Ht 63.0 in | Wt 127.0 lb

## 2022-09-22 DIAGNOSIS — Z794 Long term (current) use of insulin: Secondary | ICD-10-CM | POA: Diagnosis not present

## 2022-09-22 DIAGNOSIS — E119 Type 2 diabetes mellitus without complications: Secondary | ICD-10-CM

## 2022-09-22 DIAGNOSIS — I10 Essential (primary) hypertension: Secondary | ICD-10-CM

## 2022-09-22 DIAGNOSIS — Z7984 Long term (current) use of oral hypoglycemic drugs: Secondary | ICD-10-CM | POA: Diagnosis not present

## 2022-09-22 DIAGNOSIS — Z7985 Long-term (current) use of injectable non-insulin antidiabetic drugs: Secondary | ICD-10-CM | POA: Diagnosis not present

## 2022-09-22 LAB — POCT GLYCOSYLATED HEMOGLOBIN (HGB A1C): Hemoglobin A1C: 5.9 % — AB (ref 4.0–5.6)

## 2022-09-22 MED ORDER — INSULIN GLARGINE 100 UNIT/ML SOLOSTAR PEN
18.0000 [IU] | PEN_INJECTOR | Freq: Every day | SUBCUTANEOUS | 1 refills | Status: DC
Start: 2022-09-22 — End: 2022-09-30

## 2022-09-22 MED ORDER — TRULICITY 0.75 MG/0.5ML ~~LOC~~ SOAJ: 0.7500 mg | SUBCUTANEOUS | 1 refills | Status: DC

## 2022-09-22 NOTE — Progress Notes (Signed)
Subjective:    Patient ID: Katrina Garrison, female    DOB: May 13, 1940, 82 y.o.   MRN: [id number]  HPI 82 year old female who  has a past medical history of Anemia, Diabetes mellitus type II (2005), Diverticulosis, GERD (gastroesophageal reflux disease), History of lithotripsy, Hypertension, and Kidney stones.  DM type 2 -she is currently managed with Lantus  18 units in the morning,  glipizide 5 mg extended release in the evening, and Trulicity 0.75 mg weekly ) ozempic caused abdominal pain). She is tolerating trulicity well.   She is using a continuous glucose monitor to check her blood sugars. She has had a couple of recent lows that were symptomatic.   She has previously tried Novolin R, [place], [place], [place], [place], and metformin and reports side effects with all of these medications.  CONTINUOUS GLUCOSE MONITORING RECORD INTERPRETATION                 Dates of Recording:    Sensor description: Dexcom   Results statistics:      90 day average  133   Time in range 83%  % Time Above 180 13%  % Time above 250 1%  % Time Below target 2%    Her last A1c was 6.3 I May 2024.    HTN -managed with Norvasc 10 mg in the morning, olmesartan 40 mg in the evening, and spironolactone 25 mg in the evening. She has not been monitoring her blood  pressures at home.  BP Readings from Last 3 Encounters:  09/22/22 (!) 140/80  06/16/22 (!) 140/80  04/30/22 (!) 150/80    Review of Systems See HPI   Past Medical History:  Diagnosis Date   Anemia    Diabetes mellitus type II 2005   Diverticulosis    GERD (gastroesophageal reflux disease)    History of lithotripsy    x3 for kidney stones   Hypertension    Kidney stones     Social History   Socioeconomic History   Marital status: Divorced    Spouse name: Not on file   Number of children: Not on file   Years of education: Not on file   Highest education level: Some college, no degree  Occupational History    Occupation: Retired  Tobacco Use   Smoking status: Former    Current packs/day: 0.00    Types: Cigarettes    Start date: 07/30/1983    Quit date: 01/28/1985    Years since quitting: 37.6   Smokeless tobacco: Never  Vaping Use   Vaping status: Never Used  Substance and Sexual Activity   Alcohol use: No   Drug use: No   Sexual activity: Not on file  Other Topics Concern   Not on file  Social History Narrative   Not on file   Social Determinants of Health   Financial Resource Strain: Low Risk  (03/16/2022)   Overall Financial Resource Strain (CARDIA)    Difficulty of Paying Living Expenses: Not very hard  Food Insecurity: No Food Insecurity (05/25/2022)   Hunger Vital Sign    Worried About Running Out of Food in the Last Year: Never true    Ran Out of Food in the Last Year: Never true  Transportation Needs: No Transportation Needs (03/16/2022)   PRAPARE - Administrator, Civil Service (Medical): No    Lack of Transportation (Non-Medical): No  Physical Activity: Insufficiently Active (03/16/2022)   Exercise Vital Sign    Days of  Exercise per Week: 3 days    Minutes of Exercise per Session: 20 min  Stress: No Stress Concern Present (03/16/2022)   [employer] of Occupational Health - Occupational Stress Questionnaire    Feeling of Stress : Only a little  Social Connections: Moderately Integrated (03/16/2022)   Social Connection and Isolation Panel [NHANES]    Frequency of Communication with Friends and Family: More than three times a week    Frequency of Social Gatherings with Friends and Family: More than three times a week    Attends Religious Services: 1 to 4 times per year    Active Member of [employer] or Organizations: Yes    Attends Banker Meetings: More than 4 times per year    Marital Status: Widowed  Intimate Partner Violence: Not At Risk (10/08/2020)   Humiliation, Afraid, Rape, and Kick questionnaire    Fear of Current or Ex-Partner: No     Emotionally Abused: No    Physically Abused: No    Sexually Abused: No    Past Surgical History:  Procedure Laterality Date   ABDOMINAL HYSTERECTOMY     childbirth x 2     EYE SURGERY     cataract    Family History  Problem Relation Age of Onset   Thyroid cancer Mother    Diabetes Mother    Hypertension Mother    Alcohol abuse Father    Hypertension Father    Multiple sclerosis Daughter    Multiple sclerosis Daughter    Multiple sclerosis Sister     Allergies  Allergen Reactions   Hydrochlorothiazide    Jardiance [Empagliflozin]     Yeast infections    Lisinopril Cough   Losartan Cough   Metformin And Related Diarrhea   Metoprolol Swelling    Leg cramping    Novolin R [Insulin] Itching   Toujeo Solostar [Insulin Glargine] Rash    Current Outpatient Medications on File Prior to Visit  Medication Sig Dispense Refill   amLODipine (NORVASC) 10 MG tablet TAKE ONE TABLET BY MOUTH EVERY MORNING 90 tablet 3   aspirin 81 MG tablet Take 81 mg by mouth as needed.     ciprofloxacin (CILOXAN) 0.3 % ophthalmic solution SMARTSIG:In Eye(s)     COMFORT EZ PEN NEEDLES 31G X 6 MM MISC USE ONCE daily AS DIRECTED 90 each 3   glipiZIDE (GLUCOTROL XL) 5 MG 24 hr tablet TAKE ONE TABLET BY MOUTH EVERY EVENING 90 tablet 0   ibuprofen (ADVIL,MOTRIN) 200 MG tablet Take 200 mg by mouth every 6 (six) hours as needed. Reported on 03/22/2015     insulin glargine (LANTUS) 100 UNIT/ML Solostar Pen Inject 18 Units into the skin daily. 15 mL 0   MAXITROL 0.1 % ophthalmic suspension Apply 1 drop to eye 3 (three) times daily.     Multiple Vitamins-Minerals (MULTIPLE VITAMINS/WOMENS PO) Take by mouth.     neomycin-polymyxin b-dexamethasone (MAXITROL) 3.5-10000-0.1 OINT      olmesartan (BENICAR) 40 MG tablet TAKE ONE TABLET BY MOUTH EVERY EVENING 90 tablet 3   spironolactone (ALDACTONE) 25 MG tablet TAKE ONE TABLET BY MOUTH ONCE DAILY 90 tablet 3   No current facility-administered medications on file  prior to visit.    BP (!) 140/80   Temp 98.5 F (36.9 C) (Oral)   Ht 5\' 3"  (1.6 m)   Wt 127 lb (57.6 kg)   BMI 22.50 kg/m       Objective:   Physical Exam Vitals and nursing note reviewed.  Constitutional:      Appearance: Normal appearance. She is obese.  Cardiovascular:     Rate and Rhythm: Normal rate and regular rhythm.     Pulses: Normal pulses.     Heart sounds: Normal heart sounds.  Pulmonary:     Effort: Pulmonary effort is normal.     Breath sounds: Normal breath sounds.  Skin:    General: Skin is warm and dry.  Neurological:     General: No focal deficit present.     Mental Status: She is alert and oriented to person, place, and time.  Psychiatric:        Mood and Affect: Mood normal.        Behavior: Behavior normal.        Thought Content: Thought content normal.        Judgment: Judgment normal.        Assessment & Plan:  1. Diabetes mellitus treated with insulin and oral medication (HCC)  - POC HgB A1c- 5.9.  - Will d/c Glipizide for the next 3 months. She will let me know if her blood sugars start to elevated  - insulin glargine (LANTUS) 100 UNIT/ML Solostar Pen; Inject 18 Units into the skin daily.  Dispense: 15 mL; Refill: 1  2. Long-term current use of injectable noninsulin antidiabetic medication  - Dulaglutide (TRULICITY) 0.75 MG/0.5ML SOPN; Inject 0.75 mg into the skin once a week.  Dispense: 6 mL; Refill: 1  3. Essential hypertension - well controlled. No change in medication   [name], NP

## 2022-09-22 NOTE — Patient Instructions (Addendum)
Your A1c was 5.9   I am going to stop your glipizide for the next three months   Continue with Trulicity and Insulin

## 2022-09-23 ENCOUNTER — Encounter (INDEPENDENT_AMBULATORY_CARE_PROVIDER_SITE_OTHER): Payer: Medicare HMO | Admitting: Ophthalmology

## 2022-09-29 ENCOUNTER — Telehealth: Payer: Self-pay | Admitting: Adult Health

## 2022-09-29 DIAGNOSIS — E1165 Type 2 diabetes mellitus with hyperglycemia: Secondary | ICD-10-CM

## 2022-09-29 DIAGNOSIS — I10 Essential (primary) hypertension: Secondary | ICD-10-CM

## 2022-09-29 DIAGNOSIS — Z7985 Long-term (current) use of injectable non-insulin antidiabetic drugs: Secondary | ICD-10-CM

## 2022-09-29 DIAGNOSIS — Z7984 Long term (current) use of oral hypoglycemic drugs: Secondary | ICD-10-CM

## 2022-09-29 NOTE — Telephone Encounter (Addendum)
Pt is calling and [facility] is closed and she would like #90 day supply of each medication amLODipine (NORVASC) 10 MG tablet , Dulaglutide (TRULICITY) 0.75 MG/0.5ML SOPN ,insulin glargine (LANTUS) 100 UNIT/ML Solostar Pen , olmesartan (BENICAR) 40 MG tablet and spironolactone (ALDACTONE) 25 MG tablet , please send to   [id number] [place] ([place]), [place] - [address] Phone: [phone]  Fax: [phone]    Pt is out of medications

## 2022-09-30 ENCOUNTER — Encounter (INDEPENDENT_AMBULATORY_CARE_PROVIDER_SITE_OTHER): Payer: Medicare HMO | Admitting: Ophthalmology

## 2022-09-30 DIAGNOSIS — H35033 Hypertensive retinopathy, bilateral: Secondary | ICD-10-CM | POA: Diagnosis not present

## 2022-09-30 DIAGNOSIS — I10 Essential (primary) hypertension: Secondary | ICD-10-CM

## 2022-09-30 DIAGNOSIS — H43813 Vitreous degeneration, bilateral: Secondary | ICD-10-CM | POA: Diagnosis not present

## 2022-09-30 DIAGNOSIS — Z794 Long term (current) use of insulin: Secondary | ICD-10-CM | POA: Diagnosis not present

## 2022-09-30 DIAGNOSIS — E113313 Type 2 diabetes mellitus with moderate nonproliferative diabetic retinopathy with macular edema, bilateral: Secondary | ICD-10-CM | POA: Diagnosis not present

## 2022-09-30 DIAGNOSIS — Z7985 Long-term (current) use of injectable non-insulin antidiabetic drugs: Secondary | ICD-10-CM

## 2022-09-30 MED ORDER — AMLODIPINE BESYLATE 10 MG PO TABS
10.0000 mg | ORAL_TABLET | Freq: Every morning | ORAL | 3 refills | Status: DC
Start: 2022-09-30 — End: 2023-10-14

## 2022-09-30 MED ORDER — SPIRONOLACTONE 25 MG PO TABS: 25.0000 mg | ORAL_TABLET | Freq: Every day | ORAL | 3 refills | Status: DC

## 2022-09-30 MED ORDER — INSULIN GLARGINE 100 UNIT/ML SOLOSTAR PEN: 18.0000 [IU] | PEN_INJECTOR | Freq: Every day | SUBCUTANEOUS | 1 refills | Status: DC

## 2022-09-30 MED ORDER — TRULICITY 0.75 MG/0.5ML ~~LOC~~ SOPN
0.7500 mg | PEN_INJECTOR | SUBCUTANEOUS | 1 refills | Status: AC
Start: 2022-09-30 — End: 2023-03-17

## 2022-09-30 NOTE — Telephone Encounter (Signed)
I called pt no answer. Will call back shortly.

## 2022-09-30 NOTE — Telephone Encounter (Signed)
Pt calling to check on progress of medication, says she is completely out and hoping to have it by the weekend. Blood pressure meds needed

## 2022-09-30 NOTE — Telephone Encounter (Signed)
Pt called, returning CMA's call. CMA was unavailable. Pt asked that CMA call back at her earliest convenience. 

## 2022-09-30 NOTE — Telephone Encounter (Signed)
Spoke with pt and she stated that [facility] is going out of business. All medications called into [facility] per pt request.

## 2022-10-26 ENCOUNTER — Ambulatory Visit (INDEPENDENT_AMBULATORY_CARE_PROVIDER_SITE_OTHER): Payer: Medicare HMO

## 2022-10-26 VITALS — Ht 63.0 in | Wt 127.0 lb

## 2022-10-26 DIAGNOSIS — Z0000 Encounter for general adult medical examination without abnormal findings: Secondary | ICD-10-CM

## 2022-10-26 NOTE — Patient Instructions (Addendum)
[name] , Thank you for taking time to come for your Medicare Wellness Visit. I appreciate your ongoing commitment to your health goals. Please review the following plan we discussed and let me know if I can assist you in the future.   Referrals/Orders/Follow-Ups/Clinician Recommendations:   This is a list of the screening recommended for you and due dates:  Health Maintenance  Topic Date Due   Flu Shot  08/27/2022   COVID-19 Vaccine (3 - 2023-24 season) 09/27/2022   Hemoglobin A1C  03/25/2023   Yearly kidney health urinalysis for diabetes  06/16/2023   Complete foot exam   06/16/2023   Yearly kidney function blood test for diabetes  07/29/2023   Eye exam for diabetics  08/20/2023   Medicare Annual Wellness Visit  10/26/2023   DTaP/Tdap/Td vaccine (4 - Td or Tdap) 06/11/2027   Pneumonia Vaccine  Completed   DEXA scan (bone density measurement)  Completed   Zoster (Shingles) Vaccine  Completed   HPV Vaccine  Aged Out    Advanced directives: (Copy Requested) Please bring a copy of your health care power of attorney and living will to the office to be added to your chart at your convenience.  Next Medicare Annual Wellness Visit scheduled for next year: Yes

## 2022-10-26 NOTE — Progress Notes (Signed)
Subjective:   Katrina Garrison is a 82 y.o. female who presents for Medicare Annual (Subsequent) preventive examination.  Visit Complete: Virtual  I connected with  Katrina Garrison on 10/26/22 by a audio enabled telemedicine application and verified that I am speaking with the correct person using two identifiers.  Patient Location: Home  Provider Location: Home Office  I discussed the limitations of evaluation and management by telemedicine. The patient expressed understanding and agreed to proceed.  Patient Medicare AWV questionnaire was completed by the patient on 10/24/22.; I have confirmed that all information answered by patient is correct and no changes since this date.  Cardiac Risk Factors include: advanced age (>68men, >13 women);diabetes mellitus;hypertension Because this visit was a virtual/telehealth visit, some criteria may be missing or patient reported. Any vitals not documented were not able to be obtained and vitals that have been documented are patient reported.      Objective:    Today's Vitals   10/26/22 1547  Weight: 127 lb (57.6 kg)  Height: 5\' 3"  (1.6 m)   Body mass index is 22.5 kg/m.     10/26/2022    3:58 PM 10/21/2021    3:35 PM 10/08/2020   11:45 AM 11/07/2019    2:17 PM 03/26/2015    9:52 AM 01/18/2013    6:02 PM  Advanced Directives  Does Patient Have a Medical Advance Directive? Yes Yes Yes Yes Yes Patient does not have advance directive;Patient would not like information  Type of Public librarian Power of [place];Living will Healthcare Power of [place];Living will Healthcare Power of [place];Living will Healthcare Power of [place];Living will    Does patient want to make changes to medical advance directive?    No - Patient declined    Copy of Healthcare Power of Attorney in Chart? No - copy requested No - copy requested No - copy requested No - copy requested    Pre-existing out of facility DNR order (yellow form or pink  MOST form)      No    Current Medications (verified) Outpatient Encounter Medications as of 10/26/2022  Medication Sig   amLODipine (NORVASC) 10 MG tablet Take 1 tablet (10 mg total) by mouth every morning.   aspirin 81 MG tablet Take 81 mg by mouth as needed.   ciprofloxacin (CILOXAN) 0.3 % ophthalmic solution SMARTSIG:In Eye(s)   COMFORT EZ PEN NEEDLES 31G X 6 MM MISC USE ONCE daily AS DIRECTED   Dulaglutide (TRULICITY) 0.75 MG/0.5ML SOPN Inject 0.75 mg into the skin once a week.   ibuprofen (ADVIL,MOTRIN) 200 MG tablet Take 200 mg by mouth every 6 (six) hours as needed. Reported on 03/22/2015   insulin glargine (LANTUS) 100 UNIT/ML Solostar Pen Inject 18 Units into the skin daily.   MAXITROL 0.1 % ophthalmic suspension Apply 1 drop to eye 3 (three) times daily.   Multiple Vitamins-Minerals (MULTIPLE VITAMINS/WOMENS PO) Take by mouth.   neomycin-polymyxin b-dexamethasone (MAXITROL) 3.5-10000-0.1 OINT    olmesartan (BENICAR) 40 MG tablet TAKE ONE TABLET BY MOUTH EVERY EVENING   spironolactone (ALDACTONE) 25 MG tablet Take 1 tablet (25 mg total) by mouth daily.   No facility-administered encounter medications on file as of 10/26/2022.    Allergies (verified) Hydrochlorothiazide, Jardiance [empagliflozin], Lisinopril, Losartan, Metformin and related, Metoprolol, Novolin r [insulin], and Toujeo solostar [insulin glargine]   History: Past Medical History:  Diagnosis Date   Anemia    Diabetes mellitus type II 2005   Diverticulosis    GERD (gastroesophageal reflux disease)  History of lithotripsy    x3 for kidney stones   Hypertension    Kidney stones    Past Surgical History:  Procedure Laterality Date   ABDOMINAL HYSTERECTOMY     childbirth x 2     EYE SURGERY     cataract   Family History  Problem Relation Age of Onset   Thyroid cancer Mother    Diabetes Mother    Hypertension Mother    Alcohol abuse Father    Hypertension Father    Multiple sclerosis Daughter     Multiple sclerosis Daughter    Multiple sclerosis Sister    Social History   Socioeconomic History   Marital status: Divorced    Spouse name: Not on file   Number of children: Not on file   Years of education: Not on file   Highest education level: Some college, no degree  Occupational History   Occupation: Retired  Tobacco Use   Smoking status: Former    Current packs/day: 0.00    Types: Cigarettes    Start date: 07/30/1983    Quit date: 01/28/1985    Years since quitting: 37.7   Smokeless tobacco: Never  Vaping Use   Vaping status: Never Used  Substance and Sexual Activity   Alcohol use: No   Drug use: No   Sexual activity: Not on file  Other Topics Concern   Not on file  Social History Narrative   Not on file   Social Determinants of Health   Financial Resource Strain: Low Risk  (10/24/2022)   Overall Financial Resource Strain (CARDIA)    Difficulty of Paying Living Expenses: Not very hard  Food Insecurity: No Food Insecurity (10/24/2022)   Hunger Vital Sign    Worried About Running Out of Food in the Last Year: Never true    Ran Out of Food in the Last Year: Never true  Transportation Needs: No Transportation Needs (10/24/2022)   PRAPARE - Administrator, Civil Service (Medical): No    Lack of Transportation (Non-Medical): No  Physical Activity: Insufficiently Active (10/24/2022)   Exercise Vital Sign    Days of Exercise per Week: 2 days    Minutes of Exercise per Session: 20 min  Stress: No Stress Concern Present (10/24/2022)   [employer] of Occupational Health - Occupational Stress Questionnaire    Feeling of Stress : Only a little  Social Connections: Moderately Integrated (10/24/2022)   Social Connection and Isolation Panel [NHANES]    Frequency of Communication with Friends and Family: More than three times a week    Frequency of Social Gatherings with Friends and Family: More than three times a week    Attends Religious Services: 1 to 4 times  per year    Active Member of [employer] or Organizations: Yes    Attends Banker Meetings: More than 4 times per year    Marital Status: Widowed    Tobacco Counseling Counseling given: Not Answered   Clinical Intake:  Pre-visit preparation completed: Yes  Pain : No/denies pain     BMI - recorded: 22.5 Nutritional Status: BMI of 19-24  Normal Nutritional Risks: None Diabetes: Yes CBG done?: Yes (CBG 117 Per patient) CBG resulted in Enter/ Edit results?: Yes Did pt. bring in CBG monitor from home?: No  How often do you need to have someone help you when you read instructions, pamphlets, or other written materials from your doctor or pharmacy?: 2 - Rarely  Interpreter Needed?: No  Information entered by :: Theresa Mulligan LPN   Activities of Daily Living    10/24/2022   11:57 AM  In your present state of health, do you have any difficulty performing the following activities:  Hearing? 0  Vision? 0  Difficulty concentrating or making decisions? 0  Walking or climbing stairs? 0  Dressing or bathing? 0  Doing errands, shopping? 0  Preparing Food and eating ? N  Using the Toilet? N  In the past six months, have you accidently leaked urine? N  Do you have problems with loss of bowel control? N  Managing your Medications? N  Managing your Finances? N  Housekeeping or managing your Housekeeping? N    Patient Care Team: [name], NP as PCP - General (Family Medicine) Sherrill Raring, [facility] (Pharmacist)  Indicate any recent Medical Services you may have received from other than [facility] providers in the past year (date may be approximate).     Assessment:   This is a routine wellness examination for Katrina Garrison.  Hearing/Vision screen Hearing Screening - Comments:: Denies hearing difficulties   Vision Screening - Comments:: Wears rx glasses - up to date with routine eye exams with  Dr Benjamine Mola   Goals Addressed               This Visit's Progress      Maintain Diabeties (pt-stated)         Depression Screen    10/26/2022    3:54 PM 10/21/2021    3:36 PM 01/28/2021    9:53 AM 10/08/2020   11:43 AM 01/25/2020    1:51 PM 11/07/2019    2:19 PM 03/03/2017    1:17 PM  PHQ 2/9 Scores  PHQ - 2 Score 0 0 0 0 0 0 0  PHQ- 9 Score      0     Fall Risk    10/26/2022    4:04 PM 10/24/2022   11:57 AM 03/16/2022   12:35 PM 10/21/2021    3:36 PM 01/28/2021    9:52 AM  Fall Risk   Falls in the past year? 0 0 0 0 0  Number falls in past yr: 0 0  0 0  Injury with Fall? 0 0  0 0  Risk for fall due to : No Fall Risks No Fall Risks  Medication side effect   Follow up Falls prevention discussed Falls prevention discussed  Falls prevention discussed;Education provided;Falls evaluation completed     MEDICARE RISK AT HOME: Medicare Risk at Home Any stairs in or around the home?: Yes If so, are there any without handrails?: No Home free of loose throw rugs in walkways, pet beds, electrical cords, etc?: Yes Adequate lighting in your home to reduce risk of falls?: Yes Life alert?: No Use of a cane, walker or w/c?: No Grab bars in the bathroom?: Yes Shower chair or bench in shower?: No Elevated toilet seat or a handicapped toilet?: Yes  TIMED UP AND GO:  Was the test performed?  No    Cognitive Function:        10/26/2022    3:59 PM 10/26/2022    3:58 PM 10/21/2021    3:39 PM 10/08/2020   11:49 AM 11/07/2019    2:21 PM  6CIT Screen  What Year? 0 points 0 points 0 points 0 points 0 points  What month? 0 points 0 points 0 points 0 points 0 points  What time? 0 points 0 points 0 points 0 points  0 points  Count back from 20 0 points 0 points 0 points 0 points 0 points  Months in reverse 0 points 0 points 0 points 0 points 0 points  Repeat phrase 0 points 4 points 0 points 0 points 2 points  Total Score 0 points 4 points 0 points 0 points 2 points    Immunizations Immunization History  Administered Date(s) Administered   Fluad Quad(high Dose  65+) 11/03/2019, 11/08/2020   Influenza Split 12/16/2010, 11/11/2011, 11/08/2012   Influenza Whole 01/26/2005, 12/06/2009   Influenza, High Dose Seasonal PF 10/03/2014, 12/04/2015, 11/19/2016, 10/25/2017   Influenza,inj,Quad PF,6+ Mos 11/23/2013   PFIZER(Purple Top)SARS-COV-2 Vaccination 02/17/2019, 03/10/2019   Pneumococcal Conjugate-13 10/06/2013, 06/10/2017, 06/11/2017   Pneumococcal Polysaccharide-23 01/27/2003, 12/04/2015   Td 01/27/2003, 10/06/2013   Tdap 06/10/2017   Zoster Recombinant(Shingrix) 06/10/2017, 03/25/2018    TDAP status: Up to date  Flu Vaccine status: Due, Education has been provided regarding the importance of this vaccine. Advised may receive this vaccine at local pharmacy or Health Dept. Aware to provide a copy of the vaccination record if obtained from local pharmacy or Health Dept. Verbalized acceptance and understanding.  Pneumococcal vaccine status: Up to date  Covid-19 vaccine status: Declined, Education has been provided regarding the importance of this vaccine but patient still declined. Advised may receive this vaccine at local pharmacy or Health Dept.or vaccine clinic. Aware to provide a copy of the vaccination record if obtained from local pharmacy or Health Dept. Verbalized acceptance and understanding.  Qualifies for Shingles Vaccine? Yes   Zostavax completed Yes   Shingrix Completed?: Yes  Screening Tests Health Maintenance  Topic Date Due   INFLUENZA VACCINE  08/27/2022   COVID-19 Vaccine (3 - 2023-24 season) 09/27/2022   HEMOGLOBIN A1C  03/25/2023   Diabetic kidney evaluation - Urine ACR  06/16/2023   FOOT EXAM  06/16/2023   Diabetic kidney evaluation - eGFR measurement  07/29/2023   OPHTHALMOLOGY EXAM  08/20/2023   Medicare Annual Wellness (AWV)  10/26/2023   DTaP/Tdap/Td (4 - Td or Tdap) 06/11/2027   Pneumonia Vaccine 62+ Years old  Completed   DEXA SCAN  Completed   Zoster Vaccines- Shingrix  Completed   HPV VACCINES  Aged Out     Health Maintenance  Health Maintenance Due  Topic Date Due   INFLUENZA VACCINE  08/27/2022   COVID-19 Vaccine (3 - 2023-24 season) 09/27/2022    Bone Density status: Completed 01/15/15. Results reflect: Bone density results: OSTEOPENIA. Repeat every   years.  Additional Screening:    Vision Screening: Recommended annual ophthalmology exams for early detection of glaucoma and other disorders of the eye. Is the patient up to date with their annual eye exam?  Yes  Who is the provider or what is the name of the office in which the patient attends annual eye exams? Dr Benjamine Mola If pt is not established with a provider, would they like to be referred to a provider to establish care? No .   Dental Screening: Recommended annual dental exams for proper oral hygiene  Diabetic Foot Exam: Diabetic Foot Exam: Completed 06/16/22  Community Resource Referral / Chronic Care Management:  CRR required this visit?  No   CCM required this visit?  No     Plan:     I have personally reviewed and noted the following in the patient's chart:   Medical and social history Use of alcohol, tobacco or illicit drugs  Current medications and supplements including opioid prescriptions. Patient is not currently taking  opioid prescriptions. Functional ability and status Nutritional status Physical activity Advanced directives List of other physicians Hospitalizations, surgeries, and ER visits in previous 12 months Vitals Screenings to include cognitive, depression, and falls Referrals and appointments  In addition, I have reviewed and discussed with patient certain preventive protocols, quality metrics, and best practice recommendations. A written personalized care plan for preventive services as well as general preventive health recommendations were provided to patient.     Tillie Rung, LPN   9/60/4540   After Visit Summary: (MyChart) Due to this being a telephonic visit, the after visit  summary with patients personalized plan was offered to patient via MyChart   Nurse Notes: None

## 2022-10-27 DIAGNOSIS — E1165 Type 2 diabetes mellitus with hyperglycemia: Secondary | ICD-10-CM | POA: Diagnosis not present

## 2022-10-28 ENCOUNTER — Encounter (INDEPENDENT_AMBULATORY_CARE_PROVIDER_SITE_OTHER): Payer: Medicare HMO | Admitting: Ophthalmology

## 2022-10-28 DIAGNOSIS — E113313 Type 2 diabetes mellitus with moderate nonproliferative diabetic retinopathy with macular edema, bilateral: Secondary | ICD-10-CM | POA: Diagnosis not present

## 2022-10-28 DIAGNOSIS — H43813 Vitreous degeneration, bilateral: Secondary | ICD-10-CM | POA: Diagnosis not present

## 2022-10-28 DIAGNOSIS — Z7985 Long-term (current) use of injectable non-insulin antidiabetic drugs: Secondary | ICD-10-CM | POA: Diagnosis not present

## 2022-10-28 DIAGNOSIS — H35033 Hypertensive retinopathy, bilateral: Secondary | ICD-10-CM

## 2022-10-28 DIAGNOSIS — Z794 Long term (current) use of insulin: Secondary | ICD-10-CM

## 2022-10-28 DIAGNOSIS — I10 Essential (primary) hypertension: Secondary | ICD-10-CM

## 2022-11-04 ENCOUNTER — Telehealth: Payer: Self-pay | Admitting: Adult Health

## 2022-11-04 NOTE — Telephone Encounter (Signed)
Noted  

## 2022-11-04 NOTE — Telephone Encounter (Signed)
Pt called requesting to speak to CMA (KJ). Pt was asked if call was regarding a medication refill, as I could help with that.   Pt stated it regarding a "medical problem" and would rather speak directly to CMA. Pt stated she needs a call back, "A.S.A P." Please return Pt's call at your earliest convenience.

## 2022-11-04 NOTE — Telephone Encounter (Signed)
Spoke to pt and she stated that her eye doctor, Dr. Ashley Royalty is worried about her A1c. She stated that if its lower than 5.7 he does not want to do her eye injections. Pt wants to know if she should pick her Trulicity up. Please advise

## 2022-11-25 ENCOUNTER — Encounter (INDEPENDENT_AMBULATORY_CARE_PROVIDER_SITE_OTHER): Payer: Medicare HMO | Admitting: Ophthalmology

## 2022-11-25 DIAGNOSIS — Z7985 Long-term (current) use of injectable non-insulin antidiabetic drugs: Secondary | ICD-10-CM

## 2022-11-25 DIAGNOSIS — H35033 Hypertensive retinopathy, bilateral: Secondary | ICD-10-CM | POA: Diagnosis not present

## 2022-11-25 DIAGNOSIS — H43813 Vitreous degeneration, bilateral: Secondary | ICD-10-CM

## 2022-11-25 DIAGNOSIS — I10 Essential (primary) hypertension: Secondary | ICD-10-CM

## 2022-11-25 DIAGNOSIS — Z794 Long term (current) use of insulin: Secondary | ICD-10-CM

## 2022-11-25 DIAGNOSIS — E113313 Type 2 diabetes mellitus with moderate nonproliferative diabetic retinopathy with macular edema, bilateral: Secondary | ICD-10-CM | POA: Diagnosis not present

## 2022-12-14 DIAGNOSIS — N200 Calculus of kidney: Secondary | ICD-10-CM | POA: Diagnosis not present

## 2022-12-14 DIAGNOSIS — E1122 Type 2 diabetes mellitus with diabetic chronic kidney disease: Secondary | ICD-10-CM | POA: Diagnosis not present

## 2022-12-14 DIAGNOSIS — I129 Hypertensive chronic kidney disease with stage 1 through stage 4 chronic kidney disease, or unspecified chronic kidney disease: Secondary | ICD-10-CM | POA: Diagnosis not present

## 2022-12-14 DIAGNOSIS — N1832 Chronic kidney disease, stage 3b: Secondary | ICD-10-CM | POA: Diagnosis not present

## 2022-12-14 DIAGNOSIS — K559 Vascular disorder of intestine, unspecified: Secondary | ICD-10-CM | POA: Diagnosis not present

## 2022-12-14 DIAGNOSIS — R6889 Other general symptoms and signs: Secondary | ICD-10-CM | POA: Diagnosis not present

## 2022-12-16 ENCOUNTER — Other Ambulatory Visit: Payer: Self-pay | Admitting: Internal Medicine

## 2022-12-16 DIAGNOSIS — E1122 Type 2 diabetes mellitus with diabetic chronic kidney disease: Secondary | ICD-10-CM

## 2022-12-16 DIAGNOSIS — K559 Vascular disorder of intestine, unspecified: Secondary | ICD-10-CM

## 2022-12-16 DIAGNOSIS — N200 Calculus of kidney: Secondary | ICD-10-CM

## 2022-12-16 DIAGNOSIS — N183 Chronic kidney disease, stage 3 unspecified: Secondary | ICD-10-CM

## 2022-12-20 LAB — LAB REPORT - SCANNED
Albumin, Urine POC: 705.9
Creatinine, POC: 125.1 mg/dL
EGFR: 41
Microalb Creat Ratio: 564

## 2022-12-22 ENCOUNTER — Encounter (INDEPENDENT_AMBULATORY_CARE_PROVIDER_SITE_OTHER): Payer: Medicare HMO | Admitting: Ophthalmology

## 2022-12-22 DIAGNOSIS — I10 Essential (primary) hypertension: Secondary | ICD-10-CM

## 2022-12-22 DIAGNOSIS — Z794 Long term (current) use of insulin: Secondary | ICD-10-CM

## 2022-12-22 DIAGNOSIS — H43813 Vitreous degeneration, bilateral: Secondary | ICD-10-CM

## 2022-12-22 DIAGNOSIS — E113313 Type 2 diabetes mellitus with moderate nonproliferative diabetic retinopathy with macular edema, bilateral: Secondary | ICD-10-CM | POA: Diagnosis not present

## 2022-12-22 DIAGNOSIS — H35033 Hypertensive retinopathy, bilateral: Secondary | ICD-10-CM

## 2022-12-22 DIAGNOSIS — Z7985 Long-term (current) use of injectable non-insulin antidiabetic drugs: Secondary | ICD-10-CM

## 2022-12-29 ENCOUNTER — Ambulatory Visit (INDEPENDENT_AMBULATORY_CARE_PROVIDER_SITE_OTHER): Payer: Medicare HMO | Admitting: Adult Health

## 2022-12-29 ENCOUNTER — Encounter: Payer: Self-pay | Admitting: Adult Health

## 2022-12-29 VITALS — BP 160/80 | Temp 98.1°F | Ht 63.0 in | Wt 130.0 lb

## 2022-12-29 DIAGNOSIS — R6889 Other general symptoms and signs: Secondary | ICD-10-CM | POA: Diagnosis not present

## 2022-12-29 DIAGNOSIS — Z7985 Long-term (current) use of injectable non-insulin antidiabetic drugs: Secondary | ICD-10-CM

## 2022-12-29 DIAGNOSIS — Z794 Long term (current) use of insulin: Secondary | ICD-10-CM | POA: Diagnosis not present

## 2022-12-29 DIAGNOSIS — Z7984 Long term (current) use of oral hypoglycemic drugs: Secondary | ICD-10-CM

## 2022-12-29 DIAGNOSIS — E119 Type 2 diabetes mellitus without complications: Secondary | ICD-10-CM | POA: Diagnosis not present

## 2022-12-29 DIAGNOSIS — I10 Essential (primary) hypertension: Secondary | ICD-10-CM | POA: Diagnosis not present

## 2022-12-29 DIAGNOSIS — N1832 Chronic kidney disease, stage 3b: Secondary | ICD-10-CM | POA: Diagnosis not present

## 2022-12-29 LAB — POCT GLYCOSYLATED HEMOGLOBIN (HGB A1C): Hemoglobin A1C: 6.3 % — AB (ref 4.0–5.6)

## 2022-12-29 NOTE — Progress Notes (Signed)
Subjective:    Patient ID: Katrina Garrison, female    DOB: 15-Mar-1940, 82 y.o.   MRN: [id number]  HPI  82 year old female who  has a past medical history of Anemia, Diabetes mellitus type II (2005), Diverticulosis, GERD (gastroesophageal reflux disease), History of lithotripsy, Hypertension, and Kidney stones.  She presents to the office today for follow up   DM type 2 -she is currently managed with Lantus  18 units in the morning, and Trulicity 0.75 mg weekly ) ozempic caused abdominal pain). She is tolerating trulicity well. During her last visit Glipizide 5 mg ER was discontinued due to hypoglycemia.   She is using a continuous glucose monitor to check her blood sugars. She has not had any recent lows  She has previously tried Novolin R, [place], [place], [place], Tresiba, and metformin and reports side effects with all of these medications.  CONTINUOUS GLUCOSE MONITORING RECORD INTERPRETATION                 Dates of Recording:    Sensor description: Dexcom   Results statistics:      90 day average  133   Time in range 83%  % Time Above 180 13%  % Time above 250 1%  % Time Below target 2%    Lab Results  Component Value Date   HGBA1C 5.9 (A) 09/22/2022   HGBA1C 6.3 06/16/2022   HGBA1C 6.7 (A) 03/17/2022   HTN -managed with Norvasc 10 mg in the morning, olmesartan 40 mg in the evening, and spironolactone 25 mg in the evening. She has not been monitoring her blood  pressures at home. She was seen last month by at Nephrology who add 25 mg hydrochlorothiazide, she just has not started this yet.  BP Readings from Last 3 Encounters:  12/29/22 (!) 160/80  09/22/22 (!) 140/80  06/16/22 (!) 140/80      Review of Systems See HPI   Past Medical History:  Diagnosis Date   Anemia    Diabetes mellitus type II 2005   Diverticulosis    GERD (gastroesophageal reflux disease)    History of lithotripsy    x3 for kidney stones   Hypertension    Kidney stones      Social History   Socioeconomic History   Marital status: Divorced    Spouse name: Not on file   Number of children: Not on file   Years of education: Not on file   Highest education level: Some college, no degree  Occupational History   Occupation: Retired  Tobacco Use   Smoking status: Former    Current packs/day: 0.00    Types: Cigarettes    Start date: 07/30/1983    Quit date: 01/28/1985    Years since quitting: 37.9   Smokeless tobacco: Never  Vaping Use   Vaping status: Never Used  Substance and Sexual Activity   Alcohol use: No   Drug use: No   Sexual activity: Not on file  Other Topics Concern   Not on file  Social History Narrative   Not on file   Social Determinants of Health   Financial Resource Strain: Low Risk  (12/25/2022)   Overall Financial Resource Strain (CARDIA)    Difficulty of Paying Living Expenses: Not hard at all  Food Insecurity: No Food Insecurity (12/25/2022)   Hunger Vital Sign    Worried About Running Out of Food in the Last Year: Never true    Ran Out of Food in the  Last Year: Never true  Transportation Needs: No Transportation Needs (12/25/2022)   PRAPARE - Administrator, Civil Service (Medical): No    Lack of Transportation (Non-Medical): No  Physical Activity: Insufficiently Active (12/25/2022)   Exercise Vital Sign    Days of Exercise per Week: 3 days    Minutes of Exercise per Session: 10 min  Stress: No Stress Concern Present (12/25/2022)   [employer] of Occupational Health - Occupational Stress Questionnaire    Feeling of Stress : Not at all  Social Connections: Moderately Integrated (12/25/2022)   Social Connection and Isolation Panel [NHANES]    Frequency of Communication with Friends and Family: More than three times a week    Frequency of Social Gatherings with Friends and Family: Once a week    Attends Religious Services: More than 4 times per year    Active Member of [employer] or Organizations: No     Attends Engineer, structural: More than 4 times per year    Marital Status: Widowed  Intimate Partner Violence: Not At Risk (10/26/2022)   Humiliation, Afraid, Rape, and Kick questionnaire    Fear of Current or Ex-Partner: No    Emotionally Abused: No    Physically Abused: No    Sexually Abused: No    Past Surgical History:  Procedure Laterality Date   ABDOMINAL HYSTERECTOMY     childbirth x 2     EYE SURGERY     cataract    Family History  Problem Relation Age of Onset   Thyroid cancer Mother    Diabetes Mother    Hypertension Mother    Alcohol abuse Father    Hypertension Father    Multiple sclerosis Daughter    Multiple sclerosis Daughter    Multiple sclerosis Sister     Allergies  Allergen Reactions   Hydrochlorothiazide    Jardiance [Empagliflozin]     Yeast infections    Lisinopril Cough   Losartan Cough   Metformin And Related Diarrhea   Metoprolol Swelling    Leg cramping    Novolin R [Insulin] Itching   Toujeo Solostar [Insulin Glargine] Rash    Current Outpatient Medications on File Prior to Visit  Medication Sig Dispense Refill   amLODipine (NORVASC) 10 MG tablet Take 1 tablet (10 mg total) by mouth every morning. 90 tablet 3   aspirin 81 MG tablet Take 81 mg by mouth as needed.     ciprofloxacin (CILOXAN) 0.3 % ophthalmic solution SMARTSIG:In Eye(s)     COMFORT EZ PEN NEEDLES 31G X 6 MM MISC USE ONCE daily AS DIRECTED 90 each 3   Dulaglutide (TRULICITY) 0.75 MG/0.5ML SOPN Inject 0.75 mg into the skin once a week. 6 mL 1   ibuprofen (ADVIL,MOTRIN) 200 MG tablet Take 200 mg by mouth every 6 (six) hours as needed. Reported on 03/22/2015     insulin glargine (LANTUS) 100 UNIT/ML Solostar Pen Inject 18 Units into the skin daily. 15 mL 1   MAXITROL 0.1 % ophthalmic suspension Apply 1 drop to eye 3 (three) times daily.     Multiple Vitamins-Minerals (MULTIPLE VITAMINS/WOMENS PO) Take by mouth.     neomycin-polymyxin b-dexamethasone (MAXITROL)  3.5-10000-0.1 OINT      olmesartan (BENICAR) 40 MG tablet TAKE ONE TABLET BY MOUTH EVERY EVENING 90 tablet 3   spironolactone (ALDACTONE) 25 MG tablet Take 1 tablet (25 mg total) by mouth daily. 90 tablet 3   hydrochlorothiazide (HYDRODIURIL) 25 MG tablet Take 25 mg by mouth  daily. (Patient not taking: Reported on 12/29/2022)     No current facility-administered medications on file prior to visit.    BP (!) 160/80   Temp 98.1 F (36.7 C) (Oral)   Ht 5\' 3"  (1.6 m)   Wt 130 lb (59 kg)   BMI 23.03 kg/m       Objective:   Physical Exam Vitals and nursing note reviewed.  Constitutional:      Appearance: Normal appearance.  Cardiovascular:     Rate and Rhythm: Normal rate and regular rhythm.     Pulses: Normal pulses.     Heart sounds: Normal heart sounds.  Pulmonary:     Effort: Pulmonary effort is normal.     Breath sounds: Normal breath sounds.  Skin:    General: Skin is warm and dry.  Neurological:     General: No focal deficit present.     Mental Status: She is alert and oriented to person, place, and time.  Psychiatric:        Mood and Affect: Mood normal.        Behavior: Behavior normal.        Thought Content: Thought content normal.        Judgment: Judgment normal.        Assessment & Plan:  1. Diabetes mellitus treated with insulin and oral medication (HCC)  - POC HgB A1c- 6.3 - Continue Lantus and Trulicity  - Follow up in 3 months   2. Long-term current use of injectable noninsulin antidiabetic medication - Continue Trulicity 0.75 mg weekly - POC HgB A1c  3. Essential hypertension - Not at goal  - Start HCTZ  4. CKD stage 3b, GFR 30-44 ml/min (HCC) - Per nephrology  - start HCTZ  [name], NP

## 2022-12-29 NOTE — Patient Instructions (Signed)
Health Maintenance Due  Topic Date Due   INFLUENZA VACCINE  08/27/2022   COVID-19 Vaccine (3 - 2023-24 season) 09/27/2022       10/26/2022    3:54 PM 10/21/2021    3:36 PM 01/28/2021    9:53 AM  Depression screen PHQ 2/9  Decreased Interest 0 0 0  Down, Depressed, Hopeless 0 0 0  PHQ - 2 Score 0 0 0

## 2023-01-05 ENCOUNTER — Ambulatory Visit
Admission: RE | Admit: 2023-01-05 | Discharge: 2023-01-05 | Disposition: A | Discharge status: Home / Self Care | Payer: Medicare HMO | Source: Ambulatory Visit | Attending: Internal Medicine | Admitting: Internal Medicine

## 2023-01-05 DIAGNOSIS — E1122 Type 2 diabetes mellitus with diabetic chronic kidney disease: Secondary | ICD-10-CM

## 2023-01-05 DIAGNOSIS — N200 Calculus of kidney: Secondary | ICD-10-CM

## 2023-01-05 DIAGNOSIS — N183 Chronic kidney disease, stage 3 unspecified: Secondary | ICD-10-CM

## 2023-01-05 DIAGNOSIS — K769 Liver disease, unspecified: Secondary | ICD-10-CM | POA: Diagnosis not present

## 2023-01-05 DIAGNOSIS — R6889 Other general symptoms and signs: Secondary | ICD-10-CM | POA: Diagnosis not present

## 2023-01-05 DIAGNOSIS — K559 Vascular disorder of intestine, unspecified: Secondary | ICD-10-CM

## 2023-01-05 DIAGNOSIS — N1832 Chronic kidney disease, stage 3b: Secondary | ICD-10-CM | POA: Diagnosis not present

## 2023-01-05 NOTE — Telephone Encounter (Signed)
Disregard

## 2023-01-25 DIAGNOSIS — E1165 Type 2 diabetes mellitus with hyperglycemia: Secondary | ICD-10-CM | POA: Diagnosis not present

## 2023-01-28 ENCOUNTER — Encounter (INDEPENDENT_AMBULATORY_CARE_PROVIDER_SITE_OTHER): Payer: Medicare Other | Admitting: Ophthalmology

## 2023-01-28 DIAGNOSIS — Z7985 Long-term (current) use of injectable non-insulin antidiabetic drugs: Secondary | ICD-10-CM | POA: Diagnosis not present

## 2023-01-28 DIAGNOSIS — H43813 Vitreous degeneration, bilateral: Secondary | ICD-10-CM

## 2023-01-28 DIAGNOSIS — H35033 Hypertensive retinopathy, bilateral: Secondary | ICD-10-CM

## 2023-01-28 DIAGNOSIS — I10 Essential (primary) hypertension: Secondary | ICD-10-CM | POA: Diagnosis not present

## 2023-01-28 DIAGNOSIS — Z794 Long term (current) use of insulin: Secondary | ICD-10-CM

## 2023-01-28 DIAGNOSIS — E113313 Type 2 diabetes mellitus with moderate nonproliferative diabetic retinopathy with macular edema, bilateral: Secondary | ICD-10-CM | POA: Diagnosis not present

## 2023-02-25 ENCOUNTER — Other Ambulatory Visit: Payer: Self-pay | Admitting: Adult Health

## 2023-02-25 ENCOUNTER — Telehealth: Payer: Self-pay

## 2023-02-25 ENCOUNTER — Other Ambulatory Visit (HOSPITAL_COMMUNITY): Payer: Self-pay

## 2023-02-25 NOTE — Telephone Encounter (Signed)
Irving Burton from [facility] called back in to say no further actions are needed trulicity is good until [place] 30/2026

## 2023-02-25 NOTE — Telephone Encounter (Signed)
Pharmacy Patient Advocate Encounter   Received notification from  [facility] Portal that prior authorization for Trulicity 0.75MG /0.5ML auto-injectors is required/requested.   Insurance verification completed.   The patient is insured through [employer].   Per test claim: PA required; PA submitted to above mentioned insurance via CoverMyMeds Key/confirmation #/EOC [id number] Status is pending

## 2023-02-25 NOTE — Telephone Encounter (Signed)
Noted

## 2023-02-26 ENCOUNTER — Other Ambulatory Visit (HOSPITAL_COMMUNITY): Payer: Self-pay

## 2023-02-26 NOTE — Telephone Encounter (Signed)
 Left message to return phone call.

## 2023-02-26 NOTE — Telephone Encounter (Signed)
Pharmacy Patient Advocate Encounter  Received notification from [facility] that Prior Authorization for TRULICITY 0.75MG /0.5ML has been APPROVED from 02/25/2023 to 02/25/2024. Ran test claim, Copay is $501.00. This test claim was processed through [facility]- copay amounts may vary at other pharmacies due to pharmacy/plan contracts, or as the patient moves through the different stages of their insurance plan.   PA #/Case ID/Reference #: [id number]

## 2023-03-01 ENCOUNTER — Telehealth: Payer: Self-pay

## 2023-03-01 NOTE — Telephone Encounter (Signed)
Copied from CRM [id number]. Topic: Clinical - Medical Advice >> Mar 01, 2023  8:47 AM Elizebeth Brooking wrote: Reason for CRM: Patient called in return call to speak to Nurse, would like a callback

## 2023-03-02 ENCOUNTER — Telehealth: Payer: Self-pay

## 2023-03-02 NOTE — Telephone Encounter (Signed)
Pt is returning kendra call 

## 2023-03-02 NOTE — Telephone Encounter (Signed)
Spoke to pt and advised on message below. Pet stated she cannot afford the medication. Pt is asking for an alternative.

## 2023-03-02 NOTE — Telephone Encounter (Signed)
 Patient notified of update  and verbalized understanding.

## 2023-03-02 NOTE — Telephone Encounter (Signed)
 Copied from CRM [id number]. Topic: Clinical - Prescription Issue >> Mar 02, 2023 10:07 AM Leila BROCKS wrote: Reason for CRM: Patient [phone] returning [name]'s call. Patient cannot afford $501 foTrulicity  and would like to speak with Marjorie. Please call back.

## 2023-03-03 ENCOUNTER — Encounter (INDEPENDENT_AMBULATORY_CARE_PROVIDER_SITE_OTHER): Payer: Medicare Other | Admitting: Ophthalmology

## 2023-03-03 ENCOUNTER — Telehealth: Payer: Self-pay

## 2023-03-03 DIAGNOSIS — H35033 Hypertensive retinopathy, bilateral: Secondary | ICD-10-CM

## 2023-03-03 DIAGNOSIS — Z7985 Long-term (current) use of injectable non-insulin antidiabetic drugs: Secondary | ICD-10-CM

## 2023-03-03 DIAGNOSIS — I10 Essential (primary) hypertension: Secondary | ICD-10-CM

## 2023-03-03 DIAGNOSIS — E113313 Type 2 diabetes mellitus with moderate nonproliferative diabetic retinopathy with macular edema, bilateral: Secondary | ICD-10-CM

## 2023-03-03 DIAGNOSIS — Z794 Long term (current) use of insulin: Secondary | ICD-10-CM

## 2023-03-03 DIAGNOSIS — H43813 Vitreous degeneration, bilateral: Secondary | ICD-10-CM

## 2023-03-03 NOTE — Telephone Encounter (Signed)
 Copied from CRM [id number]. Topic: Clinical - Prescription Issue >> Mar 03, 2023 11:58 AM Deaijah H wrote: Reason for CRM: Patient called in and would like to have [name] to call ASAP referencing her medication Dulaglutide  (TRULICITY ) 0.75 MG/0.5ML SOPN after insurance payment it is stil $500 and would like to know the next option / please call [phone]

## 2023-03-03 NOTE — Telephone Encounter (Signed)
 Called pt. I spoke to pt in a previous message about this.

## 2023-03-05 ENCOUNTER — Telehealth: Payer: Self-pay

## 2023-03-05 NOTE — Telephone Encounter (Signed)
 Noted.

## 2023-03-05 NOTE — Telephone Encounter (Signed)
 Copied from CRM [id number]. Topic: General - Other >> Mar 05, 2023  7:59 AM Pinkey ORN wrote: Reason for CRM: Message To Nafziger,Cory, NP >> Mar 05, 2023  8:01 AM Pinkey ORN wrote: Patient states she picked up the insulin  glargine (LANTUS ) 100 UNIT/ML Solostar Pen and Dulaglutide  (TRULICITY ) 0.75 MG/0.5ML SOPN on yesterday and everything is GOOD!

## 2023-03-15 DIAGNOSIS — N1832 Chronic kidney disease, stage 3b: Secondary | ICD-10-CM | POA: Diagnosis not present

## 2023-03-22 ENCOUNTER — Ambulatory Visit: Payer: Self-pay | Admitting: Adult Health

## 2023-03-22 NOTE — Telephone Encounter (Signed)
 Chief Complaint: Diarrhea  Symptoms: Mild to moderate diarrhea, abdominal cramping at night Frequency: every night x 1 month  Pertinent Negatives: Patient denies fever, chest pain, dehydration  Disposition: [] ED /[] Urgent Care (no appt availability in office) / [] Appointment(In office/virtual)/ []  [facility]/ [] Home Care/ [] Refused Recommended Disposition /[] [facility]/ [x]  Follow-up with PCP Additional Notes: Patient states since starting hydrochlorothiazide along with Olmesartan she has been experiencing diarrhea every night with 4-5 episodes of diarrhea only at night. Patient believes the diarrhea is caused by the Olmesartan since that is the medication she takes in the evening. Patient wants to know if she can stop taking the medication until she has her appointment with PCP on 04/07/23. Care advice given and patient declined stating she uses transportation and can not come sooner she just wants PCP option on stopping the Olmesartan at this time. Advised I would forward message to PCP for advice at this time.  Reason for Disposition  [1] MODERATE diarrhea (e.g., 4-6 times / day more than normal) AND [2] age > 70 years  Answer Assessment - Initial Assessment Questions 1. DIARRHEA SEVERITY: "How bad is the diarrhea?" "How many more stools have you had in the past 24 hours than normal?"    - NO DIARRHEA (SCALE 0)   - MILD (SCALE 1-3): Few loose or mushy BMs; increase of 1-3 stools over normal daily number of stools; mild increase in ostomy output.   -  MODERATE (SCALE 4-7): Increase of 4-6 stools daily over normal; moderate increase in ostomy output.   -  SEVERE (SCALE 8-10; OR "WORST POSSIBLE"): Increase of 7 or more stools daily over normal; moderate increase in ostomy output; incontinence.     4-5 daily  2. ONSET: "When did the diarrhea begin?"      About a Month ago  3. BM CONSISTENCY: "How loose or watery is the diarrhea?"      Very loose stools 4. VOMITING: "Are  you also vomiting?" If Yes, ask: "How many times in the past 24 hours?"      No  5. ABDOMEN PAIN: "Are you having any abdomen pain?" If Yes, ask: "What does it feel like?" (e.g., crampy, dull, intermittent, constant)      Crampy 6. ABDOMEN PAIN SEVERITY: If present, ask: "How bad is the pain?"  (e.g., Scale 1-10; mild, moderate, or severe)   - MILD (1-3): doesn't interfere with normal activities, abdomen soft and not tender to touch    - MODERATE (4-7): interferes with normal activities or awakens from sleep, abdomen tender to touch    - SEVERE (8-10): excruciating pain, doubled over, unable to do any normal activities       5/10 7. ORAL INTAKE: If vomiting, "Have you been able to drink liquids?" "How much liquids have you had in the past 24 hours?"     No vomiting  8. HYDRATION: "Any signs of dehydration?" (e.g., dry mouth [not just dry lips], too weak to stand, dizziness, new weight loss) "When did you last urinate?"     No  9. EXPOSURE: "Have you traveled to a foreign country recently?" "Have you been exposed to anyone with diarrhea?" "Could you have eaten any food that was spoiled?"     No  10. ANTIBIOTIC USE: "Are you taking antibiotics now or have you taken antibiotics in the past 2 months?"       No  11. OTHER SYMPTOMS: "Do you have any other symptoms?" (e.g., fever, blood in stool)  Abdominal cramp, diarrhea, getting up 4-5 times throughout the night  Protocols used: [facility]

## 2023-03-22 NOTE — Telephone Encounter (Signed)
 Copied from CRM [id number]. Topic: Clinical - Medication Question >> Mar 22, 2023  1:04 PM Florestine Avers wrote: Reason for CRM: Patient called in stating that she needs Olmesartan 40 mg tab having sever cramps and diarrhea. She is taking the medication in the evening, and she wants to know if she can stop it until she sees him on the 12th. Patient is also asking for a call back, patient wants to disclose she sees Dr. Valentino Nose 03/06 for her kidney appointment.

## 2023-03-23 NOTE — Telephone Encounter (Signed)
**Note De-identified  [place] Obfuscation** Please advise 

## 2023-03-23 NOTE — Telephone Encounter (Signed)
 Pt has been scheduled.

## 2023-03-26 ENCOUNTER — Ambulatory Visit (INDEPENDENT_AMBULATORY_CARE_PROVIDER_SITE_OTHER): Payer: Medicare HMO | Admitting: Adult Health

## 2023-03-26 ENCOUNTER — Encounter: Payer: Self-pay | Admitting: Adult Health

## 2023-03-26 VITALS — BP 130/80 | HR 81 | Temp 98.1°F | Ht 63.0 in | Wt 127.0 lb

## 2023-03-26 DIAGNOSIS — R197 Diarrhea, unspecified: Secondary | ICD-10-CM | POA: Diagnosis not present

## 2023-03-26 DIAGNOSIS — Z7984 Long term (current) use of oral hypoglycemic drugs: Secondary | ICD-10-CM

## 2023-03-26 DIAGNOSIS — Z794 Long term (current) use of insulin: Secondary | ICD-10-CM | POA: Diagnosis not present

## 2023-03-26 DIAGNOSIS — E119 Type 2 diabetes mellitus without complications: Secondary | ICD-10-CM | POA: Diagnosis not present

## 2023-03-26 DIAGNOSIS — I10 Essential (primary) hypertension: Secondary | ICD-10-CM | POA: Diagnosis not present

## 2023-03-26 LAB — POCT GLYCOSYLATED HEMOGLOBIN (HGB A1C): Hemoglobin A1C: 6.1 % — AB (ref 4.0–5.6)

## 2023-03-26 NOTE — Patient Instructions (Signed)
 Your A1c was 6.1!   I am going to have you cut back on insulin to 15 units.   Stop hydrochlorothiazide for the next few days and see if the diarrhea goes away

## 2023-03-26 NOTE — Progress Notes (Signed)
 Subjective:    Patient ID: Katrina Garrison, female    DOB: 1940/09/16, 83 y.o.   MRN: [id number]  HPI 83 year old female who  has a past medical history of Anemia, Diabetes mellitus type II (2005), Diverticulosis, GERD (gastroesophageal reflux disease), History of lithotripsy, Hypertension, and Kidney stones.  She presents to the office today for follow up regarding DM and HTN   DM type 2 -she is currently managed with Lantus 18 units in the morning, and Trulicity 0.75 mg weekly ) ozempic caused abdominal pain). She is tolerating trulicity well.   Lab Results  Component Value Date   HGBA1C 6.3 (A) 12/29/2022   HGBA1C 5.9 (A) 09/22/2022   HGBA1C 6.3 06/16/2022   HTN -managed with Norvasc 10 mg in the morning, olmesartan 40 mg in the evening, and spironolactone 25 mg in the evening. She has not been monitoring her blood  pressures at home. She was seen in November 2024 by nephrology who added hydrochlorothiazide 25 mg. She reports that for the last month that she has been having abdominal cramping and diarrhea since starting hydrochlorothiazide about a month ago. She denies vomiting, nausea or blood in her stool.    Review of Systems See HPI   Past Medical History:  Diagnosis Date   Anemia    Diabetes mellitus type II 2005   Diverticulosis    GERD (gastroesophageal reflux disease)    History of lithotripsy    x3 for kidney stones   Hypertension    Kidney stones     Social History   Socioeconomic History   Marital status: Divorced    Spouse name: Not on file   Number of children: Not on file   Years of education: Not on file   Highest education level: Some college, no degree  Occupational History   Occupation: Retired  Tobacco Use   Smoking status: Former    Current packs/day: 0.00    Types: Cigarettes    Start date: 07/30/1983    Quit date: 01/28/1985    Years since quitting: 38.1   Smokeless tobacco: Never  Vaping Use   Vaping status: Never Used  Substance and  Sexual Activity   Alcohol use: No   Drug use: No   Sexual activity: Not on file  Other Topics Concern   Not on file  Social History Narrative   Not on file   Social Drivers of Health   Financial Resource Strain: Medium Risk (03/25/2023)   Overall Financial Resource Strain (CARDIA)    Difficulty of Paying Living Expenses: Somewhat hard  Food Insecurity: No Food Insecurity (03/25/2023)   Hunger Vital Sign    Worried About Running Out of Food in the Last Year: Never true    Ran Out of Food in the Last Year: Never true  Transportation Needs: No Transportation Needs (03/25/2023)   PRAPARE - Administrator, Civil Service (Medical): No    Lack of Transportation (Non-Medical): No  Physical Activity: Insufficiently Active (03/25/2023)   Exercise Vital Sign    Days of Exercise per Week: 3 days    Minutes of Exercise per Session: 20 min  Stress: No Stress Concern Present (03/25/2023)   [employer] of Occupational Health - Occupational Stress Questionnaire    Feeling of Stress : Not at all  Social Connections: Moderately Integrated (03/25/2023)   Social Connection and Isolation Panel [NHANES]    Frequency of Communication with Friends and Family: More than three times a week  Frequency of Social Gatherings with Friends and Family: Once a week    Attends Religious Services: 1 to 4 times per year    Active Member of [employer] or Organizations: Yes    Attends Banker Meetings: More than 4 times per year    Marital Status: Widowed  Intimate Partner Violence: Not At Risk (10/26/2022)   Humiliation, Afraid, Rape, and Kick questionnaire    Fear of Current or Ex-Partner: No    Emotionally Abused: No    Physically Abused: No    Sexually Abused: No    Past Surgical History:  Procedure Laterality Date   ABDOMINAL HYSTERECTOMY     childbirth x 2     EYE SURGERY     cataract    Family History  Problem Relation Age of Onset   Thyroid cancer Mother    Diabetes Mother     Hypertension Mother    Alcohol abuse Father    Hypertension Father    Multiple sclerosis Daughter    Multiple sclerosis Daughter    Multiple sclerosis Sister     Allergies  Allergen Reactions   Hydrochlorothiazide    Jardiance [Empagliflozin]     Yeast infections    Lisinopril Cough   Losartan Cough   Metformin And Related Diarrhea   Metoprolol Swelling    Leg cramping    Novolin R [Insulin] Itching   Toujeo Solostar [Insulin Glargine] Rash    Current Outpatient Medications on File Prior to Visit  Medication Sig Dispense Refill   amLODipine (NORVASC) 10 MG tablet Take 1 tablet (10 mg total) by mouth every morning. 90 tablet 3   aspirin 81 MG tablet Take 81 mg by mouth as needed.     ciprofloxacin (CILOXAN) 0.3 % ophthalmic solution SMARTSIG:In Eye(s)     hydrochlorothiazide (HYDRODIURIL) 25 MG tablet Take 25 mg by mouth daily.     ibuprofen (ADVIL,MOTRIN) 200 MG tablet Take 200 mg by mouth every 6 (six) hours as needed. Reported on 03/22/2015     MAXITROL 0.1 % ophthalmic suspension Apply 1 drop to eye 3 (three) times daily.     Multiple Vitamins-Minerals (MULTIPLE VITAMINS/WOMENS PO) Take by mouth.     neomycin-polymyxin b-dexamethasone (MAXITROL) 3.5-10000-0.1 OINT      olmesartan (BENICAR) 40 MG tablet TAKE ONE TABLET BY MOUTH EVERY EVENING 90 tablet 3   RELION PEN NEEDLES 31G X 6 MM MISC USE 1  ONCE DAILY 50 each 0   spironolactone (ALDACTONE) 25 MG tablet Take 1 tablet (25 mg total) by mouth daily. 90 tablet 3   TRULICITY 0.75 MG/0.5ML SOAJ Inject 0.75 mg into the skin once a week.     insulin glargine (LANTUS) 100 UNIT/ML Solostar Pen Inject 18 Units into the skin daily. 15 mL 1   No current facility-administered medications on file prior to visit.    BP 130/80   Pulse 81   Temp 98.1 F (36.7 C) (Oral)   Ht 5\' 3"  (1.6 m)   Wt 127 lb (57.6 kg)   SpO2 100%   BMI 22.50 kg/m       Objective:   Physical Exam Vitals and nursing note reviewed.   Constitutional:      Appearance: Normal appearance.  Cardiovascular:     Rate and Rhythm: Normal rate and regular rhythm.     Pulses: Normal pulses.     Heart sounds: Normal heart sounds.  Pulmonary:     Effort: Pulmonary effort is normal.     Breath sounds:  Normal breath sounds.  Skin:    General: Skin is warm and dry.  Neurological:     General: No focal deficit present.     Mental Status: She is alert and oriented to person, place, and time.  Psychiatric:        Mood and Affect: Mood normal.        Behavior: Behavior normal.        Thought Content: Thought content normal.        Judgment: Judgment normal.       Assessment & Plan:  1. Diabetes mellitus treated with insulin and oral medication (HCC) (Primary) -  - POC HgB A1c- 6.1  -  Will decrease Lantus to 15 units  - Continue with Trulicity  - Follow up in 3 months   2. Essential hypertension - Well controlled.   3. Diarrhea, unspecified type - Likely from hydrochlorothiazide. Will have her stop this for the next 3 days to see if diarrhea resolved   [name], NP

## 2023-03-30 ENCOUNTER — Ambulatory Visit: Payer: Medicare HMO | Admitting: Adult Health

## 2023-04-01 DIAGNOSIS — N1832 Chronic kidney disease, stage 3b: Secondary | ICD-10-CM | POA: Diagnosis not present

## 2023-04-01 DIAGNOSIS — I129 Hypertensive chronic kidney disease with stage 1 through stage 4 chronic kidney disease, or unspecified chronic kidney disease: Secondary | ICD-10-CM | POA: Diagnosis not present

## 2023-04-01 DIAGNOSIS — N200 Calculus of kidney: Secondary | ICD-10-CM | POA: Diagnosis not present

## 2023-04-01 DIAGNOSIS — E1122 Type 2 diabetes mellitus with diabetic chronic kidney disease: Secondary | ICD-10-CM | POA: Diagnosis not present

## 2023-04-06 ENCOUNTER — Ambulatory Visit: Payer: Medicare HMO | Admitting: Adult Health

## 2023-04-06 ENCOUNTER — Encounter (INDEPENDENT_AMBULATORY_CARE_PROVIDER_SITE_OTHER): Payer: Medicare Other | Admitting: Ophthalmology

## 2023-04-06 DIAGNOSIS — Z7985 Long-term (current) use of injectable non-insulin antidiabetic drugs: Secondary | ICD-10-CM

## 2023-04-06 DIAGNOSIS — H35033 Hypertensive retinopathy, bilateral: Secondary | ICD-10-CM

## 2023-04-06 DIAGNOSIS — E113313 Type 2 diabetes mellitus with moderate nonproliferative diabetic retinopathy with macular edema, bilateral: Secondary | ICD-10-CM

## 2023-04-06 DIAGNOSIS — Z794 Long term (current) use of insulin: Secondary | ICD-10-CM | POA: Diagnosis not present

## 2023-04-06 DIAGNOSIS — H43813 Vitreous degeneration, bilateral: Secondary | ICD-10-CM

## 2023-04-06 DIAGNOSIS — I10 Essential (primary) hypertension: Secondary | ICD-10-CM

## 2023-04-07 ENCOUNTER — Ambulatory Visit: Payer: Medicare HMO | Admitting: Adult Health

## 2023-04-09 ENCOUNTER — Telehealth: Payer: Self-pay | Admitting: *Deleted

## 2023-04-09 NOTE — Telephone Encounter (Signed)
 Spoke to pt and she wanted me to let Katrina Garrison Know her medications are working.

## 2023-04-09 NOTE — Telephone Encounter (Signed)
 Copied from CRM [id number]. Topic: Clinical - Medication Question >> Apr 09, 2023 10:40 AM Denese Killings wrote: Reason for CRM: Patient is requesting a callback from [facility] the nurse in regards to medication.

## 2023-04-11 ENCOUNTER — Encounter: Payer: Self-pay | Admitting: Adult Health

## 2023-04-13 NOTE — Telephone Encounter (Signed)
**Note De-identified  [place] Obfuscation** Please advise 

## 2023-04-14 ENCOUNTER — Other Ambulatory Visit: Payer: Self-pay | Admitting: Adult Health

## 2023-04-29 ENCOUNTER — Telehealth: Payer: Self-pay | Admitting: Adult Health

## 2023-04-29 MED ORDER — DEXCOM G7 SENSOR MISC: 1.0000 | Freq: Every day | 3 refills | Status: DC

## 2023-04-29 MED ORDER — DEXCOM G7 RECEIVER DEVI: 1.0000 | Freq: Every day | 2 refills | Status: AC

## 2023-04-29 NOTE — Telephone Encounter (Signed)
 Rx sent

## 2023-04-29 NOTE — Telephone Encounter (Signed)
 Called patient to review request for medication: dexcom g 7 kit for 90 day supply requested by patient. No answer, LVMTCB [phone].     Copied from CRM [id number]. Topic: Clinical - Medication Refill >> Apr 29, 2023  1:23 PM Aletta Edouard wrote: Most Recent Primary Care Visit:  Provider: [name]  Department: [facility]  Visit Type: OFFICE VISIT  Date: 03/26/2023  Medication: dexcom g7 kit 90 day supply new prescription   Has the patient contacted their pharmacy? No (Agent: If no, request that the patient contact the pharmacy for the refill. If patient does not wish to contact the pharmacy document the reason why and proceed with request.) (Agent: If yes, when and what did the pharmacy advise?)  Is this the correct pharmacy for this prescription? Yes If no, delete pharmacy and type the correct one.  This is the patient's preferred pharmacy:  [facility] [address] ([address] [place] - [address] [place] ([place]) [place] [address] Phone: [phone] Fax: [phone]   Has the prescription been filled recently? No  Is the patient out of the medication? Yes  Has the patient been seen for an appointment in the last year OR does the patient have an upcoming appointment? Yes  Can we respond through MyChart? No  Agent: Please be advised that Rx refills may take up to 3 business days. We ask that you follow-up with your pharmacy.

## 2023-05-07 ENCOUNTER — Encounter (INDEPENDENT_AMBULATORY_CARE_PROVIDER_SITE_OTHER): Admitting: Ophthalmology

## 2023-05-07 DIAGNOSIS — I10 Essential (primary) hypertension: Secondary | ICD-10-CM

## 2023-05-07 DIAGNOSIS — E113313 Type 2 diabetes mellitus with moderate nonproliferative diabetic retinopathy with macular edema, bilateral: Secondary | ICD-10-CM | POA: Diagnosis not present

## 2023-05-07 DIAGNOSIS — Z794 Long term (current) use of insulin: Secondary | ICD-10-CM

## 2023-05-07 DIAGNOSIS — Z7985 Long-term (current) use of injectable non-insulin antidiabetic drugs: Secondary | ICD-10-CM | POA: Diagnosis not present

## 2023-05-07 DIAGNOSIS — H35033 Hypertensive retinopathy, bilateral: Secondary | ICD-10-CM

## 2023-05-07 DIAGNOSIS — H43813 Vitreous degeneration, bilateral: Secondary | ICD-10-CM

## 2023-06-03 ENCOUNTER — Other Ambulatory Visit: Payer: Self-pay | Admitting: Adult Health

## 2023-06-08 ENCOUNTER — Encounter (INDEPENDENT_AMBULATORY_CARE_PROVIDER_SITE_OTHER): Admitting: Ophthalmology

## 2023-06-08 DIAGNOSIS — H35033 Hypertensive retinopathy, bilateral: Secondary | ICD-10-CM | POA: Diagnosis not present

## 2023-06-08 DIAGNOSIS — H43813 Vitreous degeneration, bilateral: Secondary | ICD-10-CM | POA: Diagnosis not present

## 2023-06-08 DIAGNOSIS — E113313 Type 2 diabetes mellitus with moderate nonproliferative diabetic retinopathy with macular edema, bilateral: Secondary | ICD-10-CM

## 2023-06-08 DIAGNOSIS — I10 Essential (primary) hypertension: Secondary | ICD-10-CM | POA: Diagnosis not present

## 2023-06-08 DIAGNOSIS — Z794 Long term (current) use of insulin: Secondary | ICD-10-CM

## 2023-06-08 DIAGNOSIS — Z7985 Long-term (current) use of injectable non-insulin antidiabetic drugs: Secondary | ICD-10-CM

## 2023-06-15 DIAGNOSIS — N1832 Chronic kidney disease, stage 3b: Secondary | ICD-10-CM | POA: Diagnosis not present

## 2023-06-22 DIAGNOSIS — N1832 Chronic kidney disease, stage 3b: Secondary | ICD-10-CM | POA: Diagnosis not present

## 2023-06-22 DIAGNOSIS — N200 Calculus of kidney: Secondary | ICD-10-CM | POA: Diagnosis not present

## 2023-06-22 DIAGNOSIS — I129 Hypertensive chronic kidney disease with stage 1 through stage 4 chronic kidney disease, or unspecified chronic kidney disease: Secondary | ICD-10-CM | POA: Diagnosis not present

## 2023-06-22 DIAGNOSIS — E1122 Type 2 diabetes mellitus with diabetic chronic kidney disease: Secondary | ICD-10-CM | POA: Diagnosis not present

## 2023-06-25 ENCOUNTER — Ambulatory Visit: Admitting: Adult Health

## 2023-06-25 ENCOUNTER — Encounter: Payer: Self-pay | Admitting: Adult Health

## 2023-06-25 VITALS — BP 138/60 | HR 87 | Temp 98.5°F | Ht 63.0 in | Wt 127.0 lb

## 2023-06-25 DIAGNOSIS — Z794 Long term (current) use of insulin: Secondary | ICD-10-CM | POA: Diagnosis not present

## 2023-06-25 DIAGNOSIS — E119 Type 2 diabetes mellitus without complications: Secondary | ICD-10-CM

## 2023-06-25 DIAGNOSIS — Z7984 Long term (current) use of oral hypoglycemic drugs: Secondary | ICD-10-CM | POA: Diagnosis not present

## 2023-06-25 DIAGNOSIS — I10 Essential (primary) hypertension: Secondary | ICD-10-CM | POA: Diagnosis not present

## 2023-06-25 LAB — POCT GLYCOSYLATED HEMOGLOBIN (HGB A1C): Hemoglobin A1C: 6 % — AB (ref 4.0–5.6)

## 2023-06-25 MED ORDER — DEXCOM G7 SENSOR MISC
1.0000 | Freq: Every day | 11 refills | Status: AC
Start: 2023-06-25 — End: ?

## 2023-06-25 NOTE — Progress Notes (Signed)
 Subjective:    Patient ID: Katrina Garrison, female    DOB: March 01, 1940, 83 y.o.   MRN: [id number]  HPI 83 year old female who  has a past medical history of Anemia, Diabetes mellitus type II (2005), Diverticulosis, GERD (gastroesophageal reflux disease), History of lithotripsy, Hypertension, and Kidney stones.  She presents to the office today for follow up regarding DM and HTN   DM type 2 -she is currently managed with Lantus  15 units in the morning ( decreased from 18 units three months ago), and Trulicity  0.75 mg weekly ( ozempic  caused abdominal pain). She is tolerating trulicity  well. She is using Dexcom  to monitor her blood sugars with avg readings of 138. She has had some lows per her reader.  Lab Results  Component Value Date   HGBA1C 6.0 (A) 06/25/2023   HGBA1C 6.1 (A) 03/26/2023   HGBA1C 6.3 (A) 12/29/2022    HTN -managed with Norvasc  10 mg in the morning, olmesartan  40 mg in the evening, hydrochlorothiazide  25 mg daily and spironolactone  25 mg in the evening. She has not been monitoring her blood  pressures at home. She was seen in November 2024 by nephrology who added hydrochlorothiazide  12.5 mg. She has been checking her BP at home with readings  BP Readings from Last 3 Encounters:  06/25/23 138/60  03/26/23 130/80  12/29/22 (!) 160/80     Review of Systems See HPI   Past Medical History:  Diagnosis Date   Anemia    Diabetes mellitus type II 2005   Diverticulosis    GERD (gastroesophageal reflux disease)    History of lithotripsy    x3 for kidney stones   Hypertension    Kidney stones     Social History   Socioeconomic History   Marital status: Divorced    Spouse name: Not on file   Number of children: Not on file   Years of education: Not on file   Highest education level: Some college, no degree  Occupational History   Occupation: Retired  Tobacco Use   Smoking status: Former    Current packs/day: 0.00    Types: Cigarettes    Start date:  07/30/1983    Quit date: 01/28/1985    Years since quitting: 38.4   Smokeless tobacco: Never  Vaping Use   Vaping status: Never Used  Substance and Sexual Activity   Alcohol use: No   Drug use: No   Sexual activity: Not on file  Other Topics Concern   Not on file  Social History Narrative   Not on file   Social Drivers of Health   Financial Resource Strain: Medium Risk (03/25/2023)   Overall Financial Resource Strain (CARDIA)    Difficulty of Paying Living Expenses: Somewhat hard  Food Insecurity: No Food Insecurity (03/25/2023)   Hunger Vital Sign    Worried About Running Out of Food in the Last Year: Never true    Ran Out of Food in the Last Year: Never true  Transportation Needs: No Transportation Needs (03/25/2023)   PRAPARE - Administrator, Civil Service (Medical): No    Lack of Transportation (Non-Medical): No  Physical Activity: Insufficiently Active (03/25/2023)   Exercise Vital Sign    Days of Exercise per Week: 3 days    Minutes of Exercise per Session: 20 min  Stress: No Stress Concern Present (03/25/2023)   [employer] of Occupational Health - Occupational Stress Questionnaire    Feeling of Stress : Not at all  Social Connections: Moderately Integrated (03/25/2023)   Social Connection and Isolation Panel [NHANES]    Frequency of Communication with Friends and Family: More than three times a week    Frequency of Social Gatherings with Friends and Family: Once a week    Attends Religious Services: 1 to 4 times per year    Active Member of [employer] or Organizations: Yes    Attends Banker Meetings: More than 4 times per year    Marital Status: Widowed  Intimate Partner Violence: Not At Risk (10/26/2022)   Humiliation, Afraid, Rape, and Kick questionnaire    Fear of Current or Ex-Partner: No    Emotionally Abused: No    Physically Abused: No    Sexually Abused: No    Past Surgical History:  Procedure Laterality Date   ABDOMINAL  HYSTERECTOMY     childbirth x 2     EYE SURGERY     cataract    Family History  Problem Relation Age of Onset   Thyroid  cancer Mother    Diabetes Mother    Hypertension Mother    Alcohol abuse Father    Hypertension Father    Multiple sclerosis Daughter    Multiple sclerosis Daughter    Multiple sclerosis Sister     Allergies  Allergen Reactions   Hydrochlorothiazide     Jardiance  [Empagliflozin ]     Yeast infections    Lisinopril  Cough   Losartan  Cough   Metformin  And Related Diarrhea   Metoprolol  Swelling    Leg cramping    Novolin R [Insulin ] Itching   Toujeo  Solostar [Insulin  Glargine] Rash    Current Outpatient Medications on File Prior to Visit  Medication Sig Dispense Refill   amLODipine  (NORVASC ) 10 MG tablet Take 1 tablet (10 mg total) by mouth every morning. 90 tablet 3   aspirin 81 MG tablet Take 81 mg by mouth as needed.     ciprofloxacin  (CILOXAN ) 0.3 % ophthalmic solution SMARTSIG:In Eye(s)     Continuous Glucose Receiver (DEXCOM G7 RECEIVER) DEVI 1 each by Does not apply route daily. 1 each 2   Continuous Glucose Sensor (DEXCOM G7 SENSOR) MISC 1 each by Does not apply route daily. 2 each 3   hydrochlorothiazide  (HYDRODIURIL ) 25 MG tablet Take 25 mg by mouth daily.     ibuprofen (ADVIL,MOTRIN) 200 MG tablet Take 200 mg by mouth every 6 (six) hours as needed. Reported on 03/22/2015     MAXITROL 0.1 % ophthalmic suspension Apply 1 drop to eye 3 (three) times daily.     Multiple Vitamins-Minerals (MULTIPLE VITAMINS/WOMENS PO) Take by mouth.     neomycin-polymyxin b-dexamethasone (MAXITROL) 3.5-10000-0.1 OINT      olmesartan  (BENICAR ) 40 MG tablet TAKE ONE TABLET BY MOUTH EVERY EVENING 90 tablet 3   RELION PEN NEEDLES 31G X 6 MM MISC USE 1  ONCE DAILY 50 each 0   spironolactone  (ALDACTONE ) 25 MG tablet Take 1 tablet (25 mg total) by mouth daily. 90 tablet 3   TRULICITY  0.75 MG/0.5ML SOAJ Inject 0.75 mg into the skin once a week.     insulin  glargine (LANTUS )  100 UNIT/ML Solostar Pen Inject 18 Units into the skin daily. (Patient taking differently: Inject 15 Units into the skin daily.) 15 mL 1   No current facility-administered medications on file prior to visit.    BP 138/60   Pulse 87   Temp 98.5 F (36.9 C) (Oral)   Ht 5\' 3"  (1.6 m)   Wt 127 lb (57.6 kg)  SpO2 96%   BMI 22.50 kg/m       Objective:   Physical Exam Vitals and nursing note reviewed.  Constitutional:      Appearance: Normal appearance.  Cardiovascular:     Rate and Rhythm: Normal rate and regular rhythm.     Pulses: Normal pulses.     Heart sounds: Normal heart sounds.  Pulmonary:     Effort: Pulmonary effort is normal.     Breath sounds: Normal breath sounds.  Skin:    General: Skin is warm and dry.  Neurological:     General: No focal deficit present.     Mental Status: She is alert and oriented to person, place, and time.  Psychiatric:        Mood and Affect: Mood normal.        Behavior: Behavior normal.        Thought Content: Thought content normal.        Judgment: Judgment normal.           Assessment & Plan:  1. Diabetes mellitus treated with insulin  and oral medication (HCC) (Primary)  - POC HgB A1c- 6.0  - Will have her decrease insulin  to 15 units  - Follow up in 3 months for CPE   2. Essential hypertension - Well controlled. No change in medication   Alto Atta, NP

## 2023-06-25 NOTE — Patient Instructions (Addendum)
 Your A1c was 6.0   Lets decrease insulin  to 12 units daily.

## 2023-07-13 ENCOUNTER — Encounter (INDEPENDENT_AMBULATORY_CARE_PROVIDER_SITE_OTHER): Admitting: Ophthalmology

## 2023-07-13 DIAGNOSIS — Z794 Long term (current) use of insulin: Secondary | ICD-10-CM

## 2023-07-13 DIAGNOSIS — I10 Essential (primary) hypertension: Secondary | ICD-10-CM

## 2023-07-13 DIAGNOSIS — H35033 Hypertensive retinopathy, bilateral: Secondary | ICD-10-CM

## 2023-07-13 DIAGNOSIS — Z7985 Long-term (current) use of injectable non-insulin antidiabetic drugs: Secondary | ICD-10-CM | POA: Diagnosis not present

## 2023-07-13 DIAGNOSIS — H43813 Vitreous degeneration, bilateral: Secondary | ICD-10-CM

## 2023-07-13 DIAGNOSIS — E113313 Type 2 diabetes mellitus with moderate nonproliferative diabetic retinopathy with macular edema, bilateral: Secondary | ICD-10-CM

## 2023-07-27 DIAGNOSIS — E119 Type 2 diabetes mellitus without complications: Secondary | ICD-10-CM | POA: Diagnosis not present

## 2023-07-28 ENCOUNTER — Other Ambulatory Visit: Payer: Self-pay | Admitting: Adult Health

## 2023-08-17 ENCOUNTER — Encounter (INDEPENDENT_AMBULATORY_CARE_PROVIDER_SITE_OTHER): Admitting: Ophthalmology

## 2023-08-17 DIAGNOSIS — H35033 Hypertensive retinopathy, bilateral: Secondary | ICD-10-CM

## 2023-08-17 DIAGNOSIS — Z7985 Long-term (current) use of injectable non-insulin antidiabetic drugs: Secondary | ICD-10-CM

## 2023-08-17 DIAGNOSIS — I10 Essential (primary) hypertension: Secondary | ICD-10-CM | POA: Diagnosis not present

## 2023-08-17 DIAGNOSIS — E113313 Type 2 diabetes mellitus with moderate nonproliferative diabetic retinopathy with macular edema, bilateral: Secondary | ICD-10-CM | POA: Diagnosis not present

## 2023-08-17 DIAGNOSIS — Z794 Long term (current) use of insulin: Secondary | ICD-10-CM | POA: Diagnosis not present

## 2023-08-17 DIAGNOSIS — H43813 Vitreous degeneration, bilateral: Secondary | ICD-10-CM

## 2023-08-27 DIAGNOSIS — E119 Type 2 diabetes mellitus without complications: Secondary | ICD-10-CM | POA: Diagnosis not present

## 2023-08-31 ENCOUNTER — Telehealth: Payer: Self-pay

## 2023-08-31 DIAGNOSIS — K080 Exfoliation of teeth due to systemic causes: Secondary | ICD-10-CM | POA: Diagnosis not present

## 2023-08-31 NOTE — Telephone Encounter (Signed)
 Pt wanted to make sure that her handicap placard was mailed to her. I advised pt that I sent it out on 08/24/2023. She stated she will call back Friday if she had not received it to possibly pick the form up.

## 2023-08-31 NOTE — Telephone Encounter (Signed)
 Copied from CRM [id number]. Topic: General - Other >> Aug 31, 2023  1:52 PM Pinkey ORN wrote: Reason for CRM: Requesting Office Call Back >> Aug 31, 2023  1:53 PM Pinkey ORN wrote: Patient is requesting a call back from Vicci Marjorie SAUNDERS, [place] . Patient call back number is [phone]

## 2023-09-07 ENCOUNTER — Encounter (INDEPENDENT_AMBULATORY_CARE_PROVIDER_SITE_OTHER): Admitting: Ophthalmology

## 2023-09-07 DIAGNOSIS — E113311 Type 2 diabetes mellitus with moderate nonproliferative diabetic retinopathy with macular edema, right eye: Secondary | ICD-10-CM

## 2023-09-07 DIAGNOSIS — Z7985 Long-term (current) use of injectable non-insulin antidiabetic drugs: Secondary | ICD-10-CM

## 2023-09-07 DIAGNOSIS — Z794 Long term (current) use of insulin: Secondary | ICD-10-CM | POA: Diagnosis not present

## 2023-09-15 ENCOUNTER — Other Ambulatory Visit: Payer: Self-pay | Admitting: Adult Health

## 2023-09-21 ENCOUNTER — Encounter (INDEPENDENT_AMBULATORY_CARE_PROVIDER_SITE_OTHER): Admitting: Ophthalmology

## 2023-09-21 DIAGNOSIS — I10 Essential (primary) hypertension: Secondary | ICD-10-CM

## 2023-09-21 DIAGNOSIS — Z7984 Long term (current) use of oral hypoglycemic drugs: Secondary | ICD-10-CM | POA: Diagnosis not present

## 2023-09-21 DIAGNOSIS — Z7985 Long-term (current) use of injectable non-insulin antidiabetic drugs: Secondary | ICD-10-CM | POA: Diagnosis not present

## 2023-09-21 DIAGNOSIS — H43813 Vitreous degeneration, bilateral: Secondary | ICD-10-CM

## 2023-09-21 DIAGNOSIS — H35033 Hypertensive retinopathy, bilateral: Secondary | ICD-10-CM

## 2023-09-21 DIAGNOSIS — E113313 Type 2 diabetes mellitus with moderate nonproliferative diabetic retinopathy with macular edema, bilateral: Secondary | ICD-10-CM

## 2023-09-27 DIAGNOSIS — E119 Type 2 diabetes mellitus without complications: Secondary | ICD-10-CM | POA: Diagnosis not present

## 2023-09-30 ENCOUNTER — Ambulatory Visit: Admitting: Adult Health

## 2023-09-30 ENCOUNTER — Encounter: Payer: Self-pay | Admitting: Adult Health

## 2023-09-30 ENCOUNTER — Ambulatory Visit: Payer: Self-pay | Admitting: Adult Health

## 2023-09-30 VITALS — BP 130/62 | HR 77 | Temp 98.3°F | Ht 63.0 in | Wt 123.0 lb

## 2023-09-30 DIAGNOSIS — Z7985 Long-term (current) use of injectable non-insulin antidiabetic drugs: Secondary | ICD-10-CM | POA: Diagnosis not present

## 2023-09-30 DIAGNOSIS — Z0000 Encounter for general adult medical examination without abnormal findings: Secondary | ICD-10-CM

## 2023-09-30 DIAGNOSIS — Z23 Encounter for immunization: Secondary | ICD-10-CM | POA: Diagnosis not present

## 2023-09-30 DIAGNOSIS — Z794 Long term (current) use of insulin: Secondary | ICD-10-CM

## 2023-09-30 DIAGNOSIS — N1832 Chronic kidney disease, stage 3b: Secondary | ICD-10-CM

## 2023-09-30 DIAGNOSIS — I10 Essential (primary) hypertension: Secondary | ICD-10-CM

## 2023-09-30 DIAGNOSIS — E118 Type 2 diabetes mellitus with unspecified complications: Secondary | ICD-10-CM

## 2023-09-30 DIAGNOSIS — M85852 Other specified disorders of bone density and structure, left thigh: Secondary | ICD-10-CM

## 2023-09-30 LAB — URINALYSIS, ROUTINE W REFLEX MICROSCOPIC
Bilirubin Urine: NEGATIVE
Hgb urine dipstick: NEGATIVE
Ketones, ur: NEGATIVE
Leukocytes,Ua: NEGATIVE
Nitrite: NEGATIVE
Specific Gravity, Urine: 1.02 (ref 1.000–1.030)
Total Protein, Urine: 30 — AB
Urine Glucose: NEGATIVE
Urobilinogen, UA: 0.2 (ref 0.0–1.0)
pH: 5.5 (ref 5.0–8.0)

## 2023-09-30 LAB — LIPID PANEL
Cholesterol: 112 mg/dL (ref 0–200)
HDL: 38.7 mg/dL — ABNORMAL LOW (ref >39.00)
LDL Cholesterol: 63 mg/dL (ref 0–99)
NonHDL: 73.68
Total CHOL/HDL Ratio: 3
Triglycerides: 51 mg/dL (ref 0.0–149.0)
VLDL: 10.2 mg/dL (ref 0.0–40.0)

## 2023-09-30 LAB — CBC WITH DIFFERENTIAL/PLATELET
Basophils Absolute: 0 K/uL (ref 0.0–0.1)
Basophils Relative: 0.4 % (ref 0.0–3.0)
Eosinophils Absolute: 0.1 K/uL (ref 0.0–0.7)
Eosinophils Relative: 1.1 % (ref 0.0–5.0)
HCT: 35.1 % — ABNORMAL LOW (ref 36.0–46.0)
Hemoglobin: 11.1 g/dL — ABNORMAL LOW (ref 12.0–15.0)
Lymphocytes Relative: 19.9 % (ref 12.0–46.0)
Lymphs Abs: 1.5 K/uL (ref 0.7–4.0)
MCHC: 31.7 g/dL (ref 30.0–36.0)
MCV: 89 fl (ref 78.0–100.0)
Monocytes Absolute: 0.5 K/uL (ref 0.1–1.0)
Monocytes Relative: 6.9 % (ref 3.0–12.0)
Neutro Abs: 5.3 K/uL (ref 1.4–7.7)
Neutrophils Relative %: 71.7 % (ref 43.0–77.0)
Platelets: 265 K/uL (ref 150.0–400.0)
RBC: 3.95 Mil/uL (ref 3.87–5.11)
RDW: 15.2 % (ref 11.5–15.5)
WBC: 7.4 K/uL (ref 4.0–10.5)

## 2023-09-30 LAB — COMPREHENSIVE METABOLIC PANEL WITH GFR
ALT: 15 U/L (ref 0–35)
AST: 18 U/L (ref 0–37)
Albumin: 4.3 g/dL (ref 3.5–5.2)
Alkaline Phosphatase: 84 U/L (ref 39–117)
BUN: 49 mg/dL — ABNORMAL HIGH (ref 6–23)
CO2: 18 meq/L — ABNORMAL LOW (ref 19–32)
Calcium: 10 mg/dL (ref 8.4–10.5)
Chloride: 111 meq/L (ref 96–112)
Creatinine, Ser: 1.59 mg/dL — ABNORMAL HIGH (ref 0.40–1.20)
GFR: 30.07 mL/min — ABNORMAL LOW (ref >60.00)
Glucose, Bld: 95 mg/dL (ref 70–99)
Potassium: 4.5 meq/L (ref 3.5–5.1)
Sodium: 139 meq/L (ref 135–145)
Total Bilirubin: 0.3 mg/dL (ref 0.2–1.2)
Total Protein: 8.7 g/dL — ABNORMAL HIGH (ref 6.0–8.3)

## 2023-09-30 LAB — MICROALBUMIN / CREATININE URINE RATIO
Creatinine,U: 80.2 mg/dL
Microalb Creat Ratio: 273.1 mg/g — ABNORMAL HIGH (ref 0.0–30.0)
Microalb, Ur: 21.9 mg/dL — ABNORMAL HIGH (ref 0.0–1.9)

## 2023-09-30 LAB — TSH: TSH: 1.65 u[IU]/mL (ref 0.35–5.50)

## 2023-09-30 LAB — HEMOGLOBIN A1C: Hgb A1c MFr Bld: 6.5 % (ref 4.6–6.5)

## 2023-09-30 MED ORDER — INSULIN GLARGINE 100 UNIT/ML SOLOSTAR PEN: 15.0000 [IU] | PEN_INJECTOR | Freq: Every day | SUBCUTANEOUS | 1 refills | Status: AC

## 2023-09-30 NOTE — Addendum Note (Signed)
 Addended by: VICCI LEADER R on: 09/30/2023 09:05 AM   Modules accepted: Orders

## 2023-09-30 NOTE — Patient Instructions (Signed)
It was great seeing you today   We will follow up with you regarding your lab work   Please let me know if you need anything   Schedule your bone density test at check out desk. You may also call directly to X-ray at [phone] to schedule an appointment that is convenient for you.  - located [address] across the street from  - in the basement - you do need an appointment for the bone density tests.  

## 2023-09-30 NOTE — Progress Notes (Signed)
 Subjective:    Patient ID: Katrina Garrison, female    DOB: Dec 07, 1940, 83 y.o.   MRN: [id number]  HPI  Patient presents for yearly preventative medicine examination. She is a pleasant 83 year old female who  has a past medical history of Anemia, Diabetes mellitus type II (2005), Diverticulosis, GERD (gastroesophageal reflux disease), History of lithotripsy, Hypertension, and Kidney stones.  DM type 2 -she is currently managed with Lantus  15 units in the morning  and Trulicity  0.75 mg weekly ( ozempic  caused abdominal pain). She is tolerating trulicity  well. She is using Dexcom  to monitor her blood sugars with avg readings of 138. She is in range 85% of the time.   She has previously tried Novolin R, Januvia , Jardiance , Toujeo , Tresiba , and metformin  and reports side effects with all of these medications.  Lab Results  Component Value Date   HGBA1C 6.0 (A) 06/25/2023   HGBA1C 6.1 (A) 03/26/2023   HGBA1C 6.3 (A) 12/29/2022    HTN -managed with Norvasc  10 mg in the morning, olmesartan  40 mg in the evening, hydrochlorothiazide  25 mg daily and spironolactone  25 mg in the evening. She has not been monitoring her blood  pressures at home. She was seen in November 2024 by nephrology who added hydrochlorothiazide  12.5 mg. She has been checking her BP at home with readings  BP Readings from Last 3 Encounters:  09/30/23 130/62  06/25/23 138/60  03/26/23 130/80   CKD -her baseline creatinine is around 1.4.  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  Appropriate screening laboratory values were ordered for the patient including screening of hyperlipidemia, renal function and hepatic function.  Medication reconciliation,  past medical history, social history, problem  list and allergies were reviewed in detail with the patient  Goals were established with regard to weight loss, exercise, and  diet in compliance with medications  Wt Readings from Last 3 Encounters:  09/30/23 123 lb (55.8 kg)  06/25/23 127 lb (57.6 kg)  03/26/23 127 lb (57.6 kg)   She is overdue for her bone density screen.   She has no acute issues and reports feeling  really good!.   Review of Systems  Constitutional: Negative.   HENT: Negative.    Eyes: Negative.   Respiratory: Negative.    Cardiovascular: Negative.   Gastrointestinal: Negative.   Endocrine: Negative.   Genitourinary: Negative.   Musculoskeletal: Negative.   Skin: Negative.   Allergic/Immunologic: Negative.   Neurological: Negative.   Hematological: Negative.   Psychiatric/Behavioral: Negative.     Past Medical History:  Diagnosis Date   Anemia    Diabetes mellitus type II 2005   Diverticulosis    GERD (gastroesophageal reflux disease)    History of lithotripsy    x3 for kidney stones   Hypertension    Kidney stones     Social History   Socioeconomic History   Marital status: Divorced    Spouse name: Not on file   Number of children: Not on file   Years of education: Not on file   Highest education level: Some college, no degree  Occupational History   Occupation: Retired  Tobacco Use   Smoking status: Former    Current packs/day: 0.00    Types: Cigarettes    Start date: 07/30/1983  Quit date: 01/28/1985    Years since quitting: 38.6   Smokeless tobacco: Never  Vaping Use   Vaping status: Never Used  Substance and Sexual Activity   Alcohol use: No   Drug use: No   Sexual activity: Not on file  Other Topics Concern   Not on file  Social History Narrative   Not on file   Social Drivers of Health   Financial Resource Strain: Low Risk  (09/27/2023)   Overall Financial Resource Strain (CARDIA)    Difficulty of Paying Living Expenses: Not very hard  Food Insecurity: No Food  Insecurity (09/27/2023)   Hunger Vital Sign    Worried About Running Out of Food in the Last Year: Never true    Ran Out of Food in the Last Year: Never true  Transportation Needs: No Transportation Needs (09/27/2023)   PRAPARE - Administrator, Civil Service (Medical): No    Lack of Transportation (Non-Medical): No  Physical Activity: Insufficiently Active (09/27/2023)   Exercise Vital Sign    Days of Exercise per Week: 4 days    Minutes of Exercise per Session: 30 min  Stress: No Stress Concern Present (09/27/2023)   [employer] of Occupational Health - Occupational Stress Questionnaire    Feeling of Stress: Not at all  Social Connections: Moderately Integrated (09/27/2023)   Social Connection and Isolation Panel    Frequency of Communication with Friends and Family: Twice a week    Frequency of Social Gatherings with Friends and Family: Once a week    Attends Religious Services: 1 to 4 times per year    Active Member of [employer] or Organizations: Yes    Attends Banker Meetings: More than 4 times per year    Marital Status: Widowed  Intimate Partner Violence: Not At Risk (10/26/2022)   Humiliation, Afraid, Rape, and Kick questionnaire    Fear of Current or Ex-Partner: No    Emotionally Abused: No    Physically Abused: No    Sexually Abused: No    Past Surgical History:  Procedure Laterality Date   ABDOMINAL HYSTERECTOMY     childbirth x 2     EYE SURGERY     cataract    Family History  Problem Relation Age of Onset   Thyroid  cancer Mother    Diabetes Mother    Hypertension Mother    Alcohol abuse Father    Hypertension Father    Multiple sclerosis Daughter    Multiple sclerosis Daughter    Multiple sclerosis Sister     Allergies  Allergen Reactions   Hydrochlorothiazide     Jardiance  [Empagliflozin ]     Yeast infections    Lisinopril  Cough   Losartan  Cough   Metformin  And Related Diarrhea   Metoprolol  Swelling    Leg cramping    Novolin  R [Insulin ] Itching   Toujeo  Solostar [Insulin  Glargine] Rash    Current Outpatient Medications on File Prior to Visit  Medication Sig Dispense Refill   amLODipine  (NORVASC ) 10 MG tablet Take 1 tablet (10 mg total) by mouth every morning. 90 tablet 3   aspirin 81 MG tablet Take 81 mg by mouth as needed.     ciprofloxacin  (CILOXAN ) 0.3 % ophthalmic solution SMARTSIG:In Eye(s)     Continuous Glucose Receiver (DEXCOM G7 RECEIVER) DEVI 1 each by Does not apply route daily. 1 each 2   Continuous Glucose Sensor (DEXCOM G7 SENSOR) MISC 1 each by Does not apply route daily. 3 each 11  hydrochlorothiazide  (HYDRODIURIL ) 25 MG tablet Take 25 mg by mouth daily.     ibuprofen (ADVIL,MOTRIN) 200 MG tablet Take 200 mg by mouth every 6 (six) hours as needed. Reported on 03/22/2015     MAXITROL 0.1 % ophthalmic suspension Apply 1 drop to eye 3 (three) times daily.     Multiple Vitamins-Minerals (MULTIPLE VITAMINS/WOMENS PO) Take by mouth.     neomycin-polymyxin b-dexamethasone (MAXITROL) 3.5-10000-0.1 OINT      olmesartan  (BENICAR ) 40 MG tablet TAKE ONE TABLET BY MOUTH EVERY EVENING 90 tablet 3   RELION PEN NEEDLES 31G X 6 MM MISC USE 1 PEN NEEDLE ONCE DAILY 50 each 0   spironolactone  (ALDACTONE ) 25 MG tablet Take 1 tablet (25 mg total) by mouth daily. 90 tablet 3   TRULICITY  0.75 MG/0.5ML SOAJ Inject 0.75 mg into the skin once a week.     No current facility-administered medications on file prior to visit.    BP 130/62   Pulse 77   Temp 98.3 F (36.8 C) (Oral)   Ht 5' 3 (1.6 m)   Wt 123 lb (55.8 kg)   SpO2 95%   BMI 21.79 kg/m       Objective:   Physical Exam Vitals and nursing note reviewed.  Constitutional:      General: She is not in acute distress.    Appearance: Normal appearance. She is not ill-appearing.  HENT:     Head: Normocephalic and atraumatic.     Right Ear: Tympanic membrane, ear canal and external ear normal. There is no impacted cerumen.     Left Ear: Tympanic membrane,  ear canal and external ear normal. There is no impacted cerumen.     Nose: Nose normal. No congestion or rhinorrhea.     Mouth/Throat:     Mouth: Mucous membranes are moist.     Pharynx: Oropharynx is clear.  Eyes:     Extraocular Movements: Extraocular movements intact.     Conjunctiva/sclera: Conjunctivae normal.     Pupils: Pupils are equal, round, and reactive to light.  Neck:     Vascular: No carotid bruit.  Cardiovascular:     Rate and Rhythm: Normal rate and regular rhythm.     Pulses: Normal pulses.     Heart sounds: No murmur heard.    No friction rub. No gallop.  Pulmonary:     Effort: Pulmonary effort is normal.     Breath sounds: Normal breath sounds.  Abdominal:     General: Abdomen is flat. Bowel sounds are normal. There is no distension.     Palpations: Abdomen is soft. There is no mass.     Tenderness: There is no abdominal tenderness. There is no guarding or rebound.     Hernia: No hernia is present.  Musculoskeletal:        General: Normal range of motion.     Cervical back: Normal range of motion and neck supple.  Lymphadenopathy:     Cervical: No cervical adenopathy.  Skin:    General: Skin is warm and dry.     Capillary Refill: Capillary refill takes less than 2 seconds.  Neurological:     General: No focal deficit present.     Mental Status: She is alert and oriented to person, place, and time.  Psychiatric:        Mood and Affect: Mood normal.        Behavior: Behavior normal.        Thought Content: Thought content normal.  Judgment: Judgment normal.        Assessment & Plan:  1. Routine general medical examination at a health care facility (Primary) Today patient counseled on age appropriate routine health concerns for screening and prevention, each reviewed and up to date or declined. Immunizations reviewed and up to date or declined. Labs ordered and reviewed. Risk factors for depression reviewed and negative. Hearing function and visual  acuity are intact. ADLs screened and addressed as needed. Functional ability and level of safety reviewed and appropriate. Education, counseling and referrals performed based on assessed risks today. Patient provided with a copy of personalized plan for preventive services. - Follow up in one year or sooner if needed  - Seasonal flu shot given   2. DM type 2, controlled, with complication (HCC)  - CBC with Differential/Platelet; Future - Comprehensive metabolic panel with GFR; Future - Lipid panel; Future - TSH; Future - Hemoglobin A1c; Future - Microalbumin/Creatinine Ratio, Urine; Future - Urinalysis; Future  3. Current use of insulin  (HCC) - Continue with Lantus  15 units  - Follow up in 3 months  - CBC with Differential/Platelet; Future - Comprehensive metabolic panel with GFR; Future - Lipid panel; Future - TSH; Future - Hemoglobin A1c; Future - Microalbumin/Creatinine Ratio, Urine; Future - Urinalysis; Future - insulin  glargine (LANTUS ) 100 UNIT/ML Solostar Pen; Inject 15 Units into the skin daily.  Dispense: 15 mL; Refill: 1  4. Long-term current use of injectable noninsulin antidiabetic medication - Continue with Trulicity  0.75 mg weekly.  - CBC with Differential/Platelet; Future - Comprehensive metabolic panel with GFR; Future - Lipid panel; Future - TSH; Future - Hemoglobin A1c; Future - Microalbumin/Creatinine Ratio, Urine; Future - Urinalysis; Future  5. Essential hypertension - Controlled. No change in medication  - CBC with Differential/Platelet; Future - Comprehensive metabolic panel with GFR; Future - Lipid panel; Future - TSH; Future  6. CKD stage 3b, GFR 30-44 ml/min (HCC) - Avoid nephrotoxic agents - Follow up with Nephrology as directed - CBC with Differential/Platelet; Future - Comprehensive metabolic panel with GFR; Future - Lipid panel; Future - TSH; Future - Urinalysis; Future  7. Osteopenia of neck of left femur  - DG Bone Density;  Future  Darleene Shape, NP

## 2023-10-05 ENCOUNTER — Encounter (INDEPENDENT_AMBULATORY_CARE_PROVIDER_SITE_OTHER): Admitting: Ophthalmology

## 2023-10-05 DIAGNOSIS — E113312 Type 2 diabetes mellitus with moderate nonproliferative diabetic retinopathy with macular edema, left eye: Secondary | ICD-10-CM | POA: Diagnosis not present

## 2023-10-05 DIAGNOSIS — Z7985 Long-term (current) use of injectable non-insulin antidiabetic drugs: Secondary | ICD-10-CM

## 2023-10-05 DIAGNOSIS — Z794 Long term (current) use of insulin: Secondary | ICD-10-CM | POA: Diagnosis not present

## 2023-10-06 MED ORDER — TRULICITY 0.75 MG/0.5ML ~~LOC~~ SOAJ: 0.7500 mg | SUBCUTANEOUS | 0 refills | Status: DC

## 2023-10-14 ENCOUNTER — Other Ambulatory Visit: Payer: Self-pay | Admitting: Adult Health

## 2023-10-14 DIAGNOSIS — I10 Essential (primary) hypertension: Secondary | ICD-10-CM

## 2023-10-14 MED ORDER — TRULICITY 0.75 MG/0.5ML ~~LOC~~ SOAJ: 0.7500 mg | SUBCUTANEOUS | 0 refills | Status: AC

## 2023-10-14 NOTE — Telephone Encounter (Signed)
 Copied from CRM [id number]. Topic: Clinical - Medication Refill >> Oct 14, 2023  1:06 PM Lauren C wrote: Medication: TRULICITY  0.75 MG/0.5ML SOAJ  Has the patient contacted their pharmacy? No (Agent: If no, request that the patient contact the pharmacy for the refill. If patient does not wish to contact the pharmacy document the reason why and proceed with request.) (Agent: If yes, when and what did the pharmacy advise?)  This is the patient's preferred pharmacy:  [facility] [address] ([address] [place] - [address] [place] ([place]) [place] [address] Phone: [phone] Fax: [phone]  Is this the correct pharmacy for this prescription? Yes If no, delete pharmacy and type the correct one.   Has the prescription been filled recently? Yes  Is the patient out of the medication? No, 2 pens left  Has the patient been seen for an appointment in the last year OR does the patient have an upcoming appointment? Yes  Can we respond through MyChart? Yes  Agent: Please be advised that Rx refills may take up to 3 business days. We ask that you follow-up with your pharmacy.

## 2023-10-19 ENCOUNTER — Encounter (INDEPENDENT_AMBULATORY_CARE_PROVIDER_SITE_OTHER): Admitting: Ophthalmology

## 2023-10-19 DIAGNOSIS — H43813 Vitreous degeneration, bilateral: Secondary | ICD-10-CM

## 2023-10-19 DIAGNOSIS — Z7985 Long-term (current) use of injectable non-insulin antidiabetic drugs: Secondary | ICD-10-CM | POA: Diagnosis not present

## 2023-10-19 DIAGNOSIS — Z794 Long term (current) use of insulin: Secondary | ICD-10-CM

## 2023-10-19 DIAGNOSIS — H35033 Hypertensive retinopathy, bilateral: Secondary | ICD-10-CM

## 2023-10-19 DIAGNOSIS — I10 Essential (primary) hypertension: Secondary | ICD-10-CM

## 2023-10-19 DIAGNOSIS — E113313 Type 2 diabetes mellitus with moderate nonproliferative diabetic retinopathy with macular edema, bilateral: Secondary | ICD-10-CM | POA: Diagnosis not present

## 2023-10-27 ENCOUNTER — Other Ambulatory Visit: Payer: Self-pay | Admitting: Adult Health

## 2023-10-27 DIAGNOSIS — E1165 Type 2 diabetes mellitus with hyperglycemia: Secondary | ICD-10-CM

## 2023-10-27 DIAGNOSIS — E119 Type 2 diabetes mellitus without complications: Secondary | ICD-10-CM | POA: Diagnosis not present

## 2023-10-30 DIAGNOSIS — Z794 Long term (current) use of insulin: Secondary | ICD-10-CM | POA: Diagnosis not present

## 2023-11-07 ENCOUNTER — Other Ambulatory Visit: Payer: Self-pay | Admitting: Adult Health

## 2023-11-14 ENCOUNTER — Other Ambulatory Visit: Payer: Self-pay | Admitting: Adult Health

## 2023-11-14 DIAGNOSIS — E1165 Type 2 diabetes mellitus with hyperglycemia: Secondary | ICD-10-CM

## 2023-11-16 ENCOUNTER — Encounter (INDEPENDENT_AMBULATORY_CARE_PROVIDER_SITE_OTHER): Admitting: Ophthalmology

## 2023-11-16 DIAGNOSIS — Z7985 Long-term (current) use of injectable non-insulin antidiabetic drugs: Secondary | ICD-10-CM | POA: Diagnosis not present

## 2023-11-16 DIAGNOSIS — Z794 Long term (current) use of insulin: Secondary | ICD-10-CM | POA: Diagnosis not present

## 2023-11-16 DIAGNOSIS — E113313 Type 2 diabetes mellitus with moderate nonproliferative diabetic retinopathy with macular edema, bilateral: Secondary | ICD-10-CM | POA: Diagnosis not present

## 2023-11-16 DIAGNOSIS — I10 Essential (primary) hypertension: Secondary | ICD-10-CM | POA: Diagnosis not present

## 2023-11-16 DIAGNOSIS — H35033 Hypertensive retinopathy, bilateral: Secondary | ICD-10-CM

## 2023-11-16 DIAGNOSIS — H43813 Vitreous degeneration, bilateral: Secondary | ICD-10-CM

## 2023-11-24 DIAGNOSIS — N1832 Chronic kidney disease, stage 3b: Secondary | ICD-10-CM | POA: Diagnosis not present

## 2023-12-01 DIAGNOSIS — N186 End stage renal disease: Secondary | ICD-10-CM | POA: Diagnosis not present

## 2023-12-01 DIAGNOSIS — E872 Acidosis, unspecified: Secondary | ICD-10-CM | POA: Diagnosis not present

## 2023-12-01 DIAGNOSIS — E1122 Type 2 diabetes mellitus with diabetic chronic kidney disease: Secondary | ICD-10-CM | POA: Diagnosis not present

## 2023-12-01 DIAGNOSIS — I129 Hypertensive chronic kidney disease with stage 1 through stage 4 chronic kidney disease, or unspecified chronic kidney disease: Secondary | ICD-10-CM | POA: Diagnosis not present

## 2023-12-01 DIAGNOSIS — N1832 Chronic kidney disease, stage 3b: Secondary | ICD-10-CM | POA: Diagnosis not present

## 2023-12-13 DIAGNOSIS — M8589 Other specified disorders of bone density and structure, multiple sites: Secondary | ICD-10-CM | POA: Diagnosis not present

## 2023-12-13 LAB — HM DEXA SCAN

## 2023-12-14 ENCOUNTER — Encounter (INDEPENDENT_AMBULATORY_CARE_PROVIDER_SITE_OTHER): Admitting: Ophthalmology

## 2023-12-14 DIAGNOSIS — E113313 Type 2 diabetes mellitus with moderate nonproliferative diabetic retinopathy with macular edema, bilateral: Secondary | ICD-10-CM

## 2023-12-14 DIAGNOSIS — H35033 Hypertensive retinopathy, bilateral: Secondary | ICD-10-CM

## 2023-12-14 DIAGNOSIS — Z7985 Long-term (current) use of injectable non-insulin antidiabetic drugs: Secondary | ICD-10-CM

## 2023-12-14 DIAGNOSIS — I10 Essential (primary) hypertension: Secondary | ICD-10-CM

## 2023-12-14 DIAGNOSIS — H43813 Vitreous degeneration, bilateral: Secondary | ICD-10-CM

## 2023-12-14 DIAGNOSIS — Z794 Long term (current) use of insulin: Secondary | ICD-10-CM

## 2023-12-21 ENCOUNTER — Ambulatory Visit (INDEPENDENT_AMBULATORY_CARE_PROVIDER_SITE_OTHER)

## 2023-12-21 VITALS — BP 130/62 | HR 77 | Temp 98.3°F | Ht 63.0 in | Wt 123.0 lb

## 2023-12-21 DIAGNOSIS — Z0000 Encounter for general adult medical examination without abnormal findings: Secondary | ICD-10-CM | POA: Diagnosis not present

## 2023-12-21 NOTE — Progress Notes (Deleted)
 Chief Complaint  Patient presents with   Medicare Wellness     Subjective:   Katrina Garrison is a 83 y.o. female who presents for a Medicare Annual Wellness Visit.  Allergies (verified) Hydrochlorothiazide , Jardiance  [empagliflozin ], Lisinopril , Losartan , Metformin  and related, Metoprolol , Novolin r [insulin ], and Toujeo  solostar [insulin  glargine]   History: Past Medical History:  Diagnosis Date   Anemia    Diabetes mellitus type II 2005   Diverticulosis    GERD (gastroesophageal reflux disease)    History of lithotripsy    x3 for kidney stones   Hypertension    Kidney stones    Past Surgical History:  Procedure Laterality Date   ABDOMINAL HYSTERECTOMY     childbirth x 2     EYE SURGERY     cataract   Family History  Problem Relation Age of Onset   Thyroid  cancer Mother    Diabetes Mother    Hypertension Mother    Alcohol abuse Father    Hypertension Father    Multiple sclerosis Daughter    Multiple sclerosis Daughter    Multiple sclerosis Sister    Social History   Occupational History   Occupation: Retired  Tobacco Use   Smoking status: Former    Current packs/day: 0.00    Types: Cigarettes    Start date: 07/30/1983    Quit date: 01/28/1985    Years since quitting: 38.9   Smokeless tobacco: Never  Vaping Use   Vaping status: Never Used  Substance and Sexual Activity   Alcohol use: No   Drug use: No   Sexual activity: Not on file   Tobacco Counseling Counseling given: Not Answered  SDOH Screenings   Food Insecurity: No Food Insecurity (12/17/2023)  Housing: Low Risk  (12/17/2023)  Transportation Needs: No Transportation Needs (12/17/2023)  Utilities: Not At Risk (12/21/2023)  Alcohol Screen: Low Risk  (12/25/2022)  Depression (PHQ2-9): Low Risk  (12/21/2023)  Financial Resource Strain: Low Risk  (12/17/2023)  Physical Activity: Insufficiently Active (12/21/2023)  Social Connections: Moderately Integrated (12/17/2023)  Stress: No Stress  Concern Present (12/17/2023)  Tobacco Use: Medium Risk (12/21/2023)  Health Literacy: Adequate Health Literacy (12/21/2023)   See flowsheets for full screening details  Depression Screen PHQ 2 & 9 Depression Scale- Over the past 2 weeks, how often have you been bothered by any of the following problems? Little interest or pleasure in doing things: 0 Feeling down, depressed, or hopeless (PHQ Adolescent also includes...irritable): 0 PHQ-2 Total Score: 0 Trouble falling or staying asleep, or sleeping too much: 0 Feeling tired or having little energy: 0 Poor appetite or overeating (PHQ Adolescent also includes...weight loss): 0 Feeling bad about yourself - or that you are a failure or have let yourself or your family down: 0 Trouble concentrating on things, such as reading the newspaper or watching television (PHQ Adolescent also includes...like school work): 0 Moving or speaking so slowly that other people could have noticed. Or the opposite - being so fidgety or restless that you have been moving around a lot more than usual: 0 Thoughts that you would be better off dead, or of hurting yourself in some way: 0 PHQ-9 Total Score: 0 If you checked off any problems, how difficult have these problems made it for you to do your work, take care of things at home, or get along with other people?: Not difficult at all  Depression Treatment Depression Interventions/Treatment : EYV7-0 Score <4 Follow-up Not Indicated     Goals Addressed  This Visit's Progress    good health daily       To stay healthy/2025       Visit info / Clinical Intake: Medicare Wellness Visit Type:: Subsequent Annual Wellness Visit Persons participating in visit:: patient Medicare Wellness Visit Mode:: Video Because this visit was a virtual/telehealth visit:: vitals recorded from last visit If Telephone or Video please confirm:: I connected with the patient using audio enabled telemedicine application  and verified that I am speaking with the correct person using two identifiers; I discussed the limitations of evaluation and management by telemedicine; The patient expressed understanding and agreed to proceed Patient Location:: Home Provider Location:: Home Information given by:: patient Interpreter Needed?: No Pre-visit prep was completed: yes AWV questionnaire completed by patient prior to visit?: yes Date:: 12/17/23 Living arrangements:: (!) lives alone Patient's Overall Health Status Rating: excellent Typical amount of pain: none Does pain affect daily life?: no Are you currently prescribed opioids?: no  Dietary Habits and Nutritional Risks How many meals a day?: 3 Eats fruit and vegetables daily?: (!) no (just veggies daily) Most meals are obtained by: preparing own meals In the last 2 weeks, have you had any of the following?: none Diabetic:: (!) yes Any non-healing wounds?: no How often do you check your BS?: continuous glucose monitor Would you like to be referred to a Nutritionist or for Diabetic Management? : no  Functional Status Activities of Daily Living (to include ambulation/medication): (Patient-Rptd) Independent Ambulation: (Patient-Rptd) Independent Medication Administration: Independent Home Management: (Patient-Rptd) Independent Manage your own finances?: yes Primary transportation is: facility / other (uber/daughter) Concerns about vision?: no *vision screening is required for WTM* Concerns about hearing?: no  Fall Screening Falls in the past year?: (Patient-Rptd) 0 Number of falls in past year: 0 Was there an injury with Fall?: (Patient-Rptd) 0 Fall Risk Category Calculator: 0 Patient Fall Risk Level: Low Fall Risk  Fall Risk Patient at Risk for Falls Due to: No Fall Risks Fall risk Follow up: Falls evaluation completed; Falls prevention discussed  Home and Transportation Safety: All rugs have non-skid backing?: N/A, no rugs All stairs or steps  have railings?: yes Grab bars in the bathtub or shower?: yes Have non-skid surface in bathtub or shower?: yes Good home lighting?: yes Regular seat belt use?: yes Hospital stays in the last year:: no  Cognitive Assessment Difficulty concentrating, remembering, or making decisions? : no Will 6CIT or Mini Cog be Completed: no 6CIT or Mini Cog Declined: patient alert, oriented, able to answer questions appropriately and recall recent events  Advance Directives (For Healthcare) Does Patient Have a Medical Advance Directive?: Yes Type of Advance Directive: Healthcare Power of [place]; Living will  Reviewed/Updated  Reviewed/Updated: Reviewed All (Medical, Surgical, Family, Medications, Allergies, Care Teams, Patient Goals)        Objective:    Today's Vitals   12/21/23 0753  Weight: 123 lb (55.8 kg)  Height: 5' 3 (1.6 m)   Body mass index is 21.79 kg/m.  Current Medications (verified) Outpatient Encounter Medications as of 12/21/2023  Medication Sig   amLODipine  (NORVASC ) 10 MG tablet TAKE 1 TABLET BY MOUTH ONCE DAILY IN THE MORNING   aspirin 81 MG tablet Take 81 mg by mouth as needed.   ciprofloxacin  (CILOXAN ) 0.3 % ophthalmic solution SMARTSIG:In Eye(s)   Continuous Glucose Receiver (DEXCOM G7 RECEIVER) DEVI 1 each by Does not apply route daily.   Continuous Glucose Sensor (DEXCOM G7 SENSOR) MISC 1 each by Does not apply route daily.  hydrochlorothiazide  (HYDRODIURIL ) 25 MG tablet Take 25 mg by mouth daily.   ibuprofen (ADVIL,MOTRIN) 200 MG tablet Take 200 mg by mouth every 6 (six) hours as needed. Reported on 03/22/2015   insulin  glargine (LANTUS ) 100 UNIT/ML Solostar Pen Inject 15 Units into the skin daily.   Insulin  Pen Needle (RELION PEN NEEDLES) 31G X 6 MM MISC USE 1  PEN NEEDLE ONCE DAILY   MAXITROL 0.1 % ophthalmic suspension Apply 1 drop to eye 3 (three) times daily. (Patient taking differently: Apply 1 drop to eye 3 (three) times daily. For laser eye surgery)    Multiple Vitamins-Minerals (MULTIPLE VITAMINS/WOMENS PO) Take by mouth.   olmesartan  (BENICAR ) 40 MG tablet TAKE ONE TABLET BY MOUTH EVERY EVENING   spironolactone  (ALDACTONE ) 25 MG tablet Take 1 tablet by mouth once daily   TRULICITY  0.75 MG/0.5ML SOAJ Inject 0.75 mg into the skin once a week.   neomycin-polymyxin b-dexamethasone (MAXITROL) 3.5-10000-0.1 OINT  (Patient not taking: Reported on 12/21/2023)   No facility-administered encounter medications on file as of 12/21/2023.   Hearing/Vision screen Hearing Screening - Comments:: Denies hearing difficulties   Vision Screening - Comments:: Had cataract surgery/Dr. Matthews/UTD Immunizations and Health Maintenance Health Maintenance  Topic Date Due   COVID-19 Vaccine (7 - 2025-26 season) 09/27/2023   HEMOGLOBIN A1C  03/29/2024   FOOT EXAM  06/24/2024   OPHTHALMOLOGY EXAM  10/04/2024   Medicare Annual Wellness (AWV)  12/20/2024   DTaP/Tdap/Td (4 - Td or Tdap) 06/11/2027   Pneumococcal Vaccine: 50+ Years  Completed   Influenza Vaccine  Completed   Bone Density Scan  Completed   Zoster Vaccines- Shingrix  Completed   Meningococcal B Vaccine  Aged Out        Assessment/Plan:  This is a routine wellness examination for [place].  Patient Care Team: Merna Huxley, NP as PCP - General (Family Medicine) Lionell Jon DEL, [facility] (Pharmacist) Alvia Norleen BIRCH, MD as Consulting Physician (Ophthalmology)  I have personally reviewed and noted the following in the patient's chart:   Medical and social history Use of alcohol, tobacco or illicit drugs  Current medications and supplements including opioid prescriptions. Functional ability and status Nutritional status Physical activity Advanced directives List of other physicians Hospitalizations, surgeries, and ER visits in previous 12 months Vitals Screenings to include cognitive, depression, and falls Referrals and appointments  No orders of the defined types were placed in this  encounter.  In addition, I have reviewed and discussed with patient certain preventive protocols, quality metrics, and best practice recommendations. A written personalized care plan for preventive services as well as general preventive health recommendations were provided to patient.   Emilia Kayes L Krista Godsil, CMA   12/21/2023   Return in 1 year (on 12/20/2024).  After Visit Summary: (MyChart) Due to this being a telephonic visit, the after visit summary with patients personalized plan was offered to patient via MyChart   Nurse Notes: Patient is up to date on all health maintenance with no concerns to address today.  Patient stated that she is very pleased with Huxley Merna, NP and that he is a great provider, one of the best.  She also felt the same about Dr. Norleen Alvia.

## 2023-12-21 NOTE — Patient Instructions (Addendum)
 Ms. Carel,  Thank you for taking the time for your Medicare Wellness Visit. I appreciate your continued commitment to your health goals. Please review the care plan we discussed, and feel free to reach out if I can assist you further.  Please note that Annual Wellness Visits do not include a physical exam. Some assessments may be limited, especially if the visit was conducted virtually. If needed, we may recommend an in-person follow-up with your provider.  Ongoing Care Seeing your primary care provider every 3 to 6 months helps us  monitor your health and provide consistent, personalized care. Next office visit on 12/30/2023.  Keep up the good work.  Referrals If a referral was made during today's visit and you haven't received any updates within two weeks, please contact the referred provider directly to check on the status.  Recommended Screenings:  Health Maintenance  Topic Date Due   COVID-19 Vaccine (7 - 2025-26 season) 09/27/2023   Medicare Annual Wellness Visit  10/26/2023   Hemoglobin A1C  03/29/2024   Complete foot exam   06/24/2024   Eye exam for diabetics  10/04/2024   DTaP/Tdap/Td vaccine (4 - Td or Tdap) 06/11/2027   Pneumococcal Vaccine for age over 21  Completed   Flu Shot  Completed   Osteoporosis screening with Bone Density Scan  Completed   Zoster (Shingles) Vaccine  Completed   Meningitis B Vaccine  Aged Out       12/17/2023    2:36 PM  Advanced Directives  Does Patient Have a Medical Advance Directive? Yes  Type of Estate Agent of [place];Living will    Vision: Annual vision screenings are recommended for early detection of glaucoma, cataracts, and diabetic retinopathy. These exams can also reveal signs of chronic conditions such as diabetes and high blood pressure.  Dental: Annual dental screenings help detect early signs of oral cancer, gum disease, and other conditions linked to overall health, including heart disease and  diabetes.  Please see the attached documents for additional preventive care recommendations.

## 2023-12-27 NOTE — Progress Notes (Cosign Needed Addendum)
 Chief Complaint  Patient presents with   Medicare Wellness     Subjective:   Katrina Garrison is a 83 y.o. female who presents for a Medicare Annual Wellness Visit.  Allergies (verified) Hydrochlorothiazide , Jardiance  [empagliflozin ], Lisinopril , Losartan , Metformin  and related, Metoprolol , Novolin r [insulin ], and Toujeo  solostar [insulin  glargine]   History: Past Medical History:  Diagnosis Date   Anemia    Diabetes mellitus type II 2005   Diverticulosis    GERD (gastroesophageal reflux disease)    History of lithotripsy    x3 for kidney stones   Hypertension    Kidney stones    Past Surgical History:  Procedure Laterality Date   ABDOMINAL HYSTERECTOMY     childbirth x 2     EYE SURGERY     cataract   Family History  Problem Relation Age of Onset   Thyroid  cancer Mother    Diabetes Mother    Hypertension Mother    Alcohol abuse Father    Hypertension Father    Multiple sclerosis Daughter    Multiple sclerosis Daughter    Multiple sclerosis Sister    Social History   Occupational History   Occupation: Retired  Tobacco Use   Smoking status: Former    Current packs/day: 0.00    Types: Cigarettes    Start date: 07/30/1983    Quit date: 01/28/1985    Years since quitting: 38.9   Smokeless tobacco: Never  Vaping Use   Vaping status: Never Used  Substance and Sexual Activity   Alcohol use: No   Drug use: No   Sexual activity: Not on file   Tobacco Counseling Counseling given: Not Answered  SDOH Screenings   Food Insecurity: No Food Insecurity (12/17/2023)  Housing: Low Risk  (12/17/2023)  Transportation Needs: No Transportation Needs (12/17/2023)  Utilities: Not At Risk (12/21/2023)  Alcohol Screen: Low Risk  (12/25/2022)  Depression (PHQ2-9): Low Risk  (12/21/2023)  Financial Resource Strain: Low Risk  (12/17/2023)  Physical Activity: Insufficiently Active (12/21/2023)  Social Connections: Moderately Integrated (12/17/2023)  Stress: No Stress  Concern Present (12/17/2023)  Tobacco Use: Medium Risk (12/21/2023)  Health Literacy: Adequate Health Literacy (12/21/2023)   See flowsheets for full screening details  Depression Screen PHQ 2 & 9 Depression Scale- Over the past 2 weeks, how often have you been bothered by any of the following problems? Little interest or pleasure in doing things: 0 Feeling down, depressed, or hopeless (PHQ Adolescent also includes...irritable): 0 PHQ-2 Total Score: 0 Trouble falling or staying asleep, or sleeping too much: 0 Feeling tired or having little energy: 0 Poor appetite or overeating (PHQ Adolescent also includes...weight loss): 0 Feeling bad about yourself - or that you are a failure or have let yourself or your family down: 0 Trouble concentrating on things, such as reading the newspaper or watching television (PHQ Adolescent also includes...like school work): 0 Moving or speaking so slowly that other people could have noticed. Or the opposite - being so fidgety or restless that you have been moving around a lot more than usual: 0 Thoughts that you would be better off dead, or of hurting yourself in some way: 0 PHQ-9 Total Score: 0 If you checked off any problems, how difficult have these problems made it for you to do your work, take care of things at home, or get along with other people?: Not difficult at all  Depression Treatment Depression Interventions/Treatment : EYV7-0 Score <4 Follow-up Not Indicated     Goals Addressed  This Visit's Progress    good health daily       To stay healthy/2025       Visit info / Clinical Intake: Medicare Wellness Visit Type:: Subsequent Annual Wellness Visit Persons participating in visit and providing information:: patient Medicare Wellness Visit Mode:: Video Since this visit was completed virtually, some vitals may be partially provided or unavailable. Missing vitals are due to the limitations of the virtual format.: vitals  recorded from last visit If Telephone or Video please confirm:: I connected with the patient using audio enabled telemedicine application and verified that I am speaking with the correct person using two identifiers; I discussed the limitations of evaluation and management by telemedicine; The patient expressed understanding and agreed to proceed Patient Location:: Home Provider Location:: Home Interpreter Needed?: No Pre-visit prep was completed: yes AWV questionnaire completed by patient prior to visit?: yes Date:: 12/17/23 Living arrangements:: (!) lives alone Patient's Overall Health Status Rating: excellent Typical amount of pain: none Does pain affect daily life?: no Are you currently prescribed opioids?: no  Dietary Habits and Nutritional Risks How many meals a day?: 3 Eats fruit and vegetables daily?: (!) no (just veggies daily) Most meals are obtained by: preparing own meals In the last 2 weeks, have you had any of the following?: none Diabetic:: (!) yes Any non-healing wounds?: no How often do you check your BS?: continuous glucose monitor Would you like to be referred to a Nutritionist or for Diabetic Management? : no  Functional Status Activities of Daily Living (to include ambulation/medication): (Patient-Rptd) Independent Ambulation: (Patient-Rptd) Independent Medication Administration: Independent Home Management: (Patient-Rptd) Independent Manage your own finances?: yes Primary transportation is: facility / other (uber/daughter) Concerns about vision?: no *vision screening is required for WTM* Concerns about hearing?: no  Fall Screening Falls in the past year?: (Patient-Rptd) 0 Number of falls in past year: 0 Was there an injury with Fall?: (Patient-Rptd) 0 Fall Risk Category Calculator: 0 Patient Fall Risk Level: Low Fall Risk  Fall Risk Patient at Risk for Falls Due to: No Fall Risks Fall risk Follow up: Falls evaluation completed; Falls prevention  discussed  Home and Transportation Safety: All rugs have non-skid backing?: N/A, no rugs All stairs or steps have railings?: yes Grab bars in the bathtub or shower?: yes Have non-skid surface in bathtub or shower?: yes Good home lighting?: yes Regular seat belt use?: yes Hospital stays in the last year:: no  Cognitive Assessment Difficulty concentrating, remembering, or making decisions? : no Will 6CIT or Mini Cog be Completed: no 6CIT or Mini Cog Declined: patient alert, oriented, able to answer questions appropriately and recall recent events  Advance Directives (For Healthcare) Does Patient Have a Medical Advance Directive?: Yes Type of Advance Directive: Healthcare Power of [place]; Living will  Reviewed/Updated  Reviewed/Updated: Reviewed All (Medical, Surgical, Family, Medications, Allergies, Care Teams, Patient Goals)        Objective:    Today's Vitals   12/21/23 0753  BP: 130/62  Pulse: 77  Temp: 98.3 F (36.8 C)  SpO2: 95%  Weight: 123 lb (55.8 kg)  Height: 5' 3 (1.6 m)   Body mass index is 21.79 kg/m.  Current Medications (verified) Outpatient Encounter Medications as of 12/21/2023  Medication Sig   amLODipine  (NORVASC ) 10 MG tablet TAKE 1 TABLET BY MOUTH ONCE DAILY IN THE MORNING   aspirin 81 MG tablet Take 81 mg by mouth as needed.   ciprofloxacin  (CILOXAN ) 0.3 % ophthalmic solution SMARTSIG:In Eye(s)   Continuous Glucose  Receiver (DEXCOM G7 RECEIVER) DEVI 1 each by Does not apply route daily.   Continuous Glucose Sensor (DEXCOM G7 SENSOR) MISC 1 each by Does not apply route daily.   hydrochlorothiazide  (HYDRODIURIL ) 25 MG tablet Take 25 mg by mouth daily.   ibuprofen (ADVIL,MOTRIN) 200 MG tablet Take 200 mg by mouth every 6 (six) hours as needed. Reported on 03/22/2015   insulin  glargine (LANTUS ) 100 UNIT/ML Solostar Pen Inject 15 Units into the skin daily.   Insulin  Pen Needle (RELION PEN NEEDLES) 31G X 6 MM MISC USE 1  PEN NEEDLE ONCE DAILY    MAXITROL 0.1 % ophthalmic suspension Apply 1 drop to eye 3 (three) times daily. (Patient taking differently: Apply 1 drop to eye 3 (three) times daily. For laser eye surgery)   Multiple Vitamins-Minerals (MULTIPLE VITAMINS/WOMENS PO) Take by mouth.   olmesartan  (BENICAR ) 40 MG tablet TAKE ONE TABLET BY MOUTH EVERY EVENING   spironolactone  (ALDACTONE ) 25 MG tablet Take 1 tablet by mouth once daily   TRULICITY  0.75 MG/0.5ML SOAJ Inject 0.75 mg into the skin once a week.   neomycin-polymyxin b-dexamethasone (MAXITROL) 3.5-10000-0.1 OINT  (Patient not taking: Reported on 12/21/2023)   No facility-administered encounter medications on file as of 12/21/2023.   Hearing/Vision screen Hearing Screening - Comments:: Denies hearing difficulties   Vision Screening - Comments:: Had cataract surgery/Dr. Matthews/UTD Immunizations and Health Maintenance Health Maintenance  Topic Date Due   COVID-19 Vaccine (7 - 2025-26 season) 09/27/2023   HEMOGLOBIN A1C  03/29/2024   FOOT EXAM  06/24/2024   OPHTHALMOLOGY EXAM  10/04/2024   Medicare Annual Wellness (AWV)  12/20/2024   DTaP/Tdap/Td (4 - Td or Tdap) 06/11/2027   Pneumococcal Vaccine: 50+ Years  Completed   Influenza Vaccine  Completed   Bone Density Scan  Completed   Zoster Vaccines- Shingrix  Completed   Meningococcal B Vaccine  Aged Out        Assessment/Plan:  This is a routine wellness examination for Katrina Garrison.  Patient Care Team: Merna Huxley, NP as PCP - General (Family Medicine) Lionell Jon DEL, [facility] (Pharmacist) Alvia Norleen BIRCH, MD as Consulting Physician (Ophthalmology)  I have personally reviewed and noted the following in the patient's chart:   Medical and social history Use of alcohol, tobacco or illicit drugs  Current medications and supplements including opioid prescriptions. Functional ability and status Nutritional status Physical activity Advanced directives List of other physicians Hospitalizations, surgeries, and  ER visits in previous 12 months Vitals Screenings to include cognitive, depression, and falls Referrals and appointments  No orders of the defined types were placed in this encounter.  In addition, I have reviewed and discussed with patient certain preventive protocols, quality metrics, and best practice recommendations. A written personalized care plan for preventive services as well as general preventive health recommendations were provided to patient.   Brocha Gilliam Garrison Kenyada Hy, CMA   12/21/2023  Return in 1 year (on 12/20/2024).  After Visit Summary: (MyChart) Due to this being a telephonic visit, the after visit summary with patients personalized plan was offered to patient via MyChart   Nurse Notes: Patient is up to date on all health maintenance with no concerns to address today.  Patient stated that she is very pleased with Huxley Merna, NP and that he is a great provider, one of the best.  She also felt the same about Dr. Norleen Alvia.

## 2023-12-30 ENCOUNTER — Ambulatory Visit: Admitting: Adult Health

## 2023-12-30 ENCOUNTER — Encounter: Payer: Self-pay | Admitting: Adult Health

## 2023-12-30 VITALS — BP 130/72 | HR 77 | Temp 98.6°F | Ht 63.0 in | Wt 124.0 lb

## 2023-12-30 DIAGNOSIS — E1122 Type 2 diabetes mellitus with diabetic chronic kidney disease: Secondary | ICD-10-CM | POA: Diagnosis not present

## 2023-12-30 DIAGNOSIS — N1832 Chronic kidney disease, stage 3b: Secondary | ICD-10-CM

## 2023-12-30 DIAGNOSIS — I10 Essential (primary) hypertension: Secondary | ICD-10-CM

## 2023-12-30 DIAGNOSIS — E118 Type 2 diabetes mellitus with unspecified complications: Secondary | ICD-10-CM

## 2023-12-30 DIAGNOSIS — Z7985 Long-term (current) use of injectable non-insulin antidiabetic drugs: Secondary | ICD-10-CM | POA: Diagnosis not present

## 2023-12-30 DIAGNOSIS — Z794 Long term (current) use of insulin: Secondary | ICD-10-CM | POA: Diagnosis not present

## 2023-12-30 LAB — POCT GLYCOSYLATED HEMOGLOBIN (HGB A1C): Hemoglobin A1C: 6.2 % — AB (ref 4.0–5.6)

## 2023-12-30 NOTE — Progress Notes (Addendum)
 Subjective:    Patient ID: Katrina Garrison, female    DOB: 12-10-1940, 83 y.o.   MRN: [id number]  HPI 83 year old female who  has a past medical history of Anemia, Diabetes mellitus type II (2005), Diverticulosis, GERD (gastroesophageal reflux disease), History of lithotripsy, Hypertension, and Kidney stones.  DM type 2 -she is currently managed with Lantus  15 units in the morning  and Trulicity  0.75 mg weekly ( ozempic  caused abdominal pain). She is tolerating trulicity  well. She is using Dexcom  to monitor her blood sugars with avg readings of 138. She is in range 85% of the time. She has not been having lows. She does have associated chronic kidney disease and is seen by Nephrology on a routine basis. Kidney function has been stable.   She has previously tried Novolin R, Januvia , Jardiance , Toujeo , Tresiba , and metformin  and reports side effects with all of these medications.  Lab Results  Component Value Date   HGBA1C 6.2 (A) 12/30/2023   HGBA1C 6.5 09/30/2023   HGBA1C 6.0 (A) 06/25/2023   HTN -managed with Norvasc  10 mg in the morning, olmesartan  40 mg in the evening, hydrochlorothiazide  12.5 mg BID and spironolactone  25 mg in the evening. She has not been monitoring her blood  pressures at home.  BP Readings from Last 3 Encounters:  12/30/23 130/72  12/21/23 130/62  09/30/23 130/62    Review of Systems See HPI   Past Medical History:  Diagnosis Date   Anemia    Diabetes mellitus type II 2005   Diverticulosis    GERD (gastroesophageal reflux disease)    History of lithotripsy    x3 for kidney stones   Hypertension    Kidney stones     Social History   Socioeconomic History   Marital status: Divorced    Spouse name: Not on file   Number of children: 2   Years of education: Not on file   Highest education level: 12th grade  Occupational History   Occupation: Retired  Tobacco Use   Smoking status: Former    Current packs/day: 0.00    Types: Cigarettes     Start date: 07/30/1983    Quit date: 01/28/1985    Years since quitting: 38.9   Smokeless tobacco: Never  Vaping Use   Vaping status: Never Used  Substance and Sexual Activity   Alcohol use: No   Drug use: No   Sexual activity: Not on file  Other Topics Concern   Not on file  Social History Narrative   Lives alone/2025   Social Drivers of Health   Financial Resource Strain: Low Risk  (12/17/2023)   Overall Financial Resource Strain (CARDIA)    Difficulty of Paying Living Expenses: Not hard at all  Food Insecurity: No Food Insecurity (12/17/2023)   Hunger Vital Sign    Worried About Running Out of Food in the Last Year: Never true    Ran Out of Food in the Last Year: Never true  Transportation Needs: No Transportation Needs (12/17/2023)   PRAPARE - Administrator, Civil Service (Medical): No    Lack of Transportation (Non-Medical): No  Physical Activity: Insufficiently Active (12/21/2023)   Exercise Vital Sign    Days of Exercise per Week: 3 days    Minutes of Exercise per Session: 10 min  Stress: No Stress Concern Present (12/17/2023)   [employer] of Occupational Health - Occupational Stress Questionnaire    Feeling of Stress: Not at all  Social  Connections: Moderately Integrated (12/17/2023)   Social Connection and Isolation Panel    Frequency of Communication with Friends and Family: More than three times a week    Frequency of Social Gatherings with Friends and Family: More than three times a week    Attends Religious Services: 1 to 4 times per year    Active Member of [employer] or Organizations: Yes    Attends Banker Meetings: More than 4 times per year    Marital Status: Widowed  Intimate Partner Violence: Not At Risk (12/21/2023)   Humiliation, Afraid, Rape, and Kick questionnaire    Fear of Current or Ex-Partner: No    Emotionally Abused: No    Physically Abused: No    Sexually Abused: No    Past Surgical History:  Procedure  Laterality Date   ABDOMINAL HYSTERECTOMY     childbirth x 2     EYE SURGERY     cataract    Family History  Problem Relation Age of Onset   Thyroid  cancer Mother    Diabetes Mother    Hypertension Mother    Alcohol abuse Father    Hypertension Father    Multiple sclerosis Daughter    Multiple sclerosis Daughter    Multiple sclerosis Sister     Allergies  Allergen Reactions   Hydrochlorothiazide     Jardiance  [Empagliflozin ]     Yeast infections    Lisinopril  Cough   Losartan  Cough   Metformin  And Related Diarrhea   Metoprolol  Swelling    Leg cramping    Novolin R [Insulin ] Itching   Toujeo  Solostar [Insulin  Glargine] Rash    Current Outpatient Medications on File Prior to Visit  Medication Sig Dispense Refill   amLODipine  (NORVASC ) 10 MG tablet TAKE 1 TABLET BY MOUTH ONCE DAILY IN THE MORNING 90 tablet 0   aspirin 81 MG tablet Take 81 mg by mouth as needed.     ciprofloxacin  (CILOXAN ) 0.3 % ophthalmic solution SMARTSIG:In Eye(s)     Continuous Glucose Receiver (DEXCOM G7 RECEIVER) DEVI 1 each by Does not apply route daily. 1 each 2   Continuous Glucose Sensor (DEXCOM G7 SENSOR) MISC 1 each by Does not apply route daily. 3 each 11   hydrochlorothiazide  (HYDRODIURIL ) 25 MG tablet Take 25 mg by mouth daily.     ibuprofen (ADVIL,MOTRIN) 200 MG tablet Take 200 mg by mouth every 6 (six) hours as needed. Reported on 03/22/2015     insulin  glargine (LANTUS ) 100 UNIT/ML Solostar Pen Inject 15 Units into the skin daily. 15 mL 1   Insulin  Pen Needle (RELION PEN NEEDLES) 31G X 6 MM MISC USE 1  PEN NEEDLE ONCE DAILY 100 each 6   MAXITROL 0.1 % ophthalmic suspension Apply 1 drop to eye 3 (three) times daily. (Patient taking differently: Apply 1 drop to eye 3 (three) times daily. For laser eye surgery)     Multiple Vitamins-Minerals (MULTIPLE VITAMINS/WOMENS PO) Take by mouth.     neomycin-polymyxin b-dexamethasone (MAXITROL) 3.5-10000-0.1 OINT      olmesartan  (BENICAR ) 40 MG tablet  TAKE ONE TABLET BY MOUTH EVERY EVENING 90 tablet 3   spironolactone  (ALDACTONE ) 25 MG tablet Take 1 tablet by mouth once daily 90 tablet 0   TRULICITY  0.75 MG/0.5ML SOAJ Inject 0.75 mg into the skin once a week. 6 mL 0   No current facility-administered medications on file prior to visit.    BP 130/72   Pulse 77   Temp 98.6 F (37 C) (Oral)   Ht  5' 3 (1.6 m)   Wt 124 lb (56.2 kg)   SpO2 97%   BMI 21.97 kg/m       Objective:   Physical Exam Vitals and nursing note reviewed.  Constitutional:      Appearance: Normal appearance.  Cardiovascular:     Rate and Rhythm: Normal rate and regular rhythm.     Pulses: Normal pulses.     Heart sounds: Normal heart sounds.  Pulmonary:     Effort: Pulmonary effort is normal.     Breath sounds: Normal breath sounds.  Skin:    General: Skin is warm and dry.  Neurological:     General: No focal deficit present.     Mental Status: She is alert and oriented to person, place, and time.  Psychiatric:        Mood and Affect: Mood normal.        Behavior: Behavior normal.        Thought Content: Thought content normal.        Judgment: Judgment normal.        Assessment & Plan:   1. Type 2 diabetes mellitus with stage 3b chronic kidney disease, with long-term current use of insulin  (HCC) (Primary) - POC HgB A1c- 6.2  - Will have her decrease dose of Lantus  from 15 units to 12 units  - Continue to avoid nephrotoxic agents  - Follow up with Nephrology as directed  - Follow up in 3 months   2. Current use of insulin  (HCC)  - POC HgB A1c  3. Long-term current use of injectable noninsulin antidiabetic medication  - POC HgB A1c  4. Essential hypertension - Well controlled. No change in medication   Darleene Shape, NP

## 2023-12-30 NOTE — Patient Instructions (Addendum)
 Your bone density showed low bone density but not osteoporosis - start taking a Vitamin D  and Calcium called OsCal   Lower your Lantus  to 12 units   Follow up in 3 months

## 2024-01-01 ENCOUNTER — Other Ambulatory Visit: Payer: Self-pay | Admitting: Adult Health

## 2024-01-01 DIAGNOSIS — I10 Essential (primary) hypertension: Secondary | ICD-10-CM

## 2024-01-11 ENCOUNTER — Encounter (INDEPENDENT_AMBULATORY_CARE_PROVIDER_SITE_OTHER): Admitting: Ophthalmology

## 2024-01-11 DIAGNOSIS — I10 Essential (primary) hypertension: Secondary | ICD-10-CM | POA: Diagnosis not present

## 2024-01-11 DIAGNOSIS — Z7985 Long-term (current) use of injectable non-insulin antidiabetic drugs: Secondary | ICD-10-CM | POA: Diagnosis not present

## 2024-01-11 DIAGNOSIS — E113313 Type 2 diabetes mellitus with moderate nonproliferative diabetic retinopathy with macular edema, bilateral: Secondary | ICD-10-CM | POA: Diagnosis not present

## 2024-01-11 DIAGNOSIS — H43813 Vitreous degeneration, bilateral: Secondary | ICD-10-CM | POA: Diagnosis not present

## 2024-01-11 DIAGNOSIS — H35033 Hypertensive retinopathy, bilateral: Secondary | ICD-10-CM | POA: Diagnosis not present

## 2024-01-11 DIAGNOSIS — Z794 Long term (current) use of insulin: Secondary | ICD-10-CM | POA: Diagnosis not present

## 2024-02-08 ENCOUNTER — Encounter (INDEPENDENT_AMBULATORY_CARE_PROVIDER_SITE_OTHER): Admitting: Ophthalmology

## 2024-02-08 DIAGNOSIS — I10 Essential (primary) hypertension: Secondary | ICD-10-CM | POA: Diagnosis not present

## 2024-02-08 DIAGNOSIS — Z794 Long term (current) use of insulin: Secondary | ICD-10-CM | POA: Diagnosis not present

## 2024-02-08 DIAGNOSIS — H43813 Vitreous degeneration, bilateral: Secondary | ICD-10-CM

## 2024-02-08 DIAGNOSIS — E113313 Type 2 diabetes mellitus with moderate nonproliferative diabetic retinopathy with macular edema, bilateral: Secondary | ICD-10-CM

## 2024-02-08 DIAGNOSIS — H35033 Hypertensive retinopathy, bilateral: Secondary | ICD-10-CM

## 2024-03-07 ENCOUNTER — Encounter (INDEPENDENT_AMBULATORY_CARE_PROVIDER_SITE_OTHER): Admitting: Ophthalmology

## 2024-03-29 ENCOUNTER — Ambulatory Visit: Admitting: Adult Health
